# Patient Record
Sex: Female | Born: 1935 | Race: White | Hispanic: No | Marital: Married | Smoking: Never smoker
Health system: Southern US, Community
[De-identification: ages and names within clinical notes are randomized; demographics above are authoritative.]

## PROBLEM LIST (undated history)

## (undated) DIAGNOSIS — R55 Syncope and collapse: Secondary | ICD-10-CM

## (undated) DIAGNOSIS — R413 Other amnesia: Secondary | ICD-10-CM

## (undated) DIAGNOSIS — D649 Anemia, unspecified: Secondary | ICD-10-CM

## (undated) DIAGNOSIS — K5900 Constipation, unspecified: Secondary | ICD-10-CM

## (undated) DIAGNOSIS — M270 Developmental disorders of jaws: Secondary | ICD-10-CM

## (undated) DIAGNOSIS — R198 Other specified symptoms and signs involving the digestive system and abdomen: Secondary | ICD-10-CM

## (undated) DIAGNOSIS — K851 Biliary acute pancreatitis without necrosis or infection: Secondary | ICD-10-CM

## (undated) DIAGNOSIS — T4145XA Adverse effect of unspecified anesthetic, initial encounter: Secondary | ICD-10-CM

## (undated) DIAGNOSIS — K219 Gastro-esophageal reflux disease without esophagitis: Secondary | ICD-10-CM

## (undated) DIAGNOSIS — N390 Urinary tract infection, site not specified: Secondary | ICD-10-CM

## (undated) DIAGNOSIS — Z9889 Other specified postprocedural states: Secondary | ICD-10-CM

## (undated) DIAGNOSIS — R609 Edema, unspecified: Secondary | ICD-10-CM

## (undated) DIAGNOSIS — J189 Pneumonia, unspecified organism: Secondary | ICD-10-CM

## (undated) DIAGNOSIS — N289 Disorder of kidney and ureter, unspecified: Secondary | ICD-10-CM

## (undated) DIAGNOSIS — E785 Hyperlipidemia, unspecified: Secondary | ICD-10-CM

## (undated) DIAGNOSIS — E079 Disorder of thyroid, unspecified: Secondary | ICD-10-CM

## (undated) DIAGNOSIS — R531 Weakness: Secondary | ICD-10-CM

## (undated) DIAGNOSIS — T8859XA Other complications of anesthesia, initial encounter: Secondary | ICD-10-CM

## (undated) DIAGNOSIS — W19XXXA Unspecified fall, initial encounter: Secondary | ICD-10-CM

## (undated) DIAGNOSIS — K859 Acute pancreatitis without necrosis or infection, unspecified: Secondary | ICD-10-CM

## (undated) DIAGNOSIS — R21 Rash and other nonspecific skin eruption: Secondary | ICD-10-CM

## (undated) DIAGNOSIS — M1990 Unspecified osteoarthritis, unspecified site: Secondary | ICD-10-CM

## (undated) DIAGNOSIS — N644 Mastodynia: Secondary | ICD-10-CM

## (undated) DIAGNOSIS — F419 Anxiety disorder, unspecified: Secondary | ICD-10-CM

## (undated) DIAGNOSIS — R0600 Dyspnea, unspecified: Secondary | ICD-10-CM

## (undated) DIAGNOSIS — K589 Irritable bowel syndrome without diarrhea: Secondary | ICD-10-CM

## (undated) DIAGNOSIS — I639 Cerebral infarction, unspecified: Secondary | ICD-10-CM

## (undated) DIAGNOSIS — IMO0002 Reserved for concepts with insufficient information to code with codable children: Secondary | ICD-10-CM

## (undated) DIAGNOSIS — E538 Deficiency of other specified B group vitamins: Secondary | ICD-10-CM

## (undated) DIAGNOSIS — R112 Nausea with vomiting, unspecified: Secondary | ICD-10-CM

## (undated) DIAGNOSIS — D472 Monoclonal gammopathy: Secondary | ICD-10-CM

## (undated) DIAGNOSIS — R002 Palpitations: Secondary | ICD-10-CM

## (undated) DIAGNOSIS — N399 Disorder of urinary system, unspecified: Secondary | ICD-10-CM

## (undated) DIAGNOSIS — R42 Dizziness and giddiness: Secondary | ICD-10-CM

## (undated) DIAGNOSIS — R3915 Urgency of urination: Secondary | ICD-10-CM

## (undated) DIAGNOSIS — I872 Venous insufficiency (chronic) (peripheral): Secondary | ICD-10-CM

## (undated) DIAGNOSIS — I10 Essential (primary) hypertension: Secondary | ICD-10-CM

## (undated) DIAGNOSIS — E039 Hypothyroidism, unspecified: Secondary | ICD-10-CM

## (undated) HISTORY — DX: Anemia, unspecified: D64.9

## (undated) HISTORY — DX: Reserved for concepts with insufficient information to code with codable children: IMO0002

## (undated) HISTORY — DX: Hyperlipidemia, unspecified: E78.5

## (undated) HISTORY — DX: Anxiety disorder, unspecified: F41.9

## (undated) HISTORY — DX: Other amnesia: R41.3

## (undated) HISTORY — DX: Edema, unspecified: R60.9

## (undated) HISTORY — DX: Weakness: R53.1

## (undated) HISTORY — DX: Venous insufficiency (chronic) (peripheral): I87.2

## (undated) HISTORY — PX: ABDOMINAL HYSTERECTOMY: SHX81

## (undated) HISTORY — DX: Disorder of urinary system, unspecified: N39.9

## (undated) HISTORY — DX: Essential (primary) hypertension: I10

## (undated) HISTORY — DX: Urinary tract infection, site not specified: N39.0

## (undated) HISTORY — DX: Unspecified fall, initial encounter: W19.XXXA

## (undated) HISTORY — DX: Dizziness and giddiness: R42

## (undated) HISTORY — DX: Monoclonal gammopathy: D47.2

## (undated) HISTORY — DX: Syncope and collapse: R55

## (undated) HISTORY — DX: Dyspnea, unspecified: R06.00

## (undated) HISTORY — PX: OTHER SURGICAL HISTORY: SHX169

## (undated) HISTORY — DX: Unspecified osteoarthritis, unspecified site: M19.90

## (undated) HISTORY — DX: Palpitations: R00.2

## (undated) HISTORY — PX: BACK SURGERY: SHX140

## (undated) HISTORY — PX: APPENDECTOMY: SHX54

## (undated) HISTORY — DX: Disorder of thyroid, unspecified: E07.9

## (undated) HISTORY — DX: Mastodynia: N64.4

## (undated) HISTORY — DX: Developmental disorders of jaws: M27.0

## (undated) HISTORY — DX: Rash and other nonspecific skin eruption: R21

## (undated) HISTORY — DX: Gastro-esophageal reflux disease without esophagitis: K21.9

## (undated) HISTORY — PX: SHOULDER SURGERY: SHX246

## (undated) HISTORY — DX: Irritable bowel syndrome, unspecified: K58.9

## (undated) HISTORY — DX: Urgency of urination: R39.15

## (undated) HISTORY — DX: Hypothyroidism, unspecified: E03.9

## (undated) HISTORY — DX: Other specified symptoms and signs involving the digestive system and abdomen: R19.8

## (undated) HISTORY — DX: Deficiency of other specified B group vitamins: E53.8

---

## 1997-09-08 LAB — HM COLONOSCOPY

## 1998-09-07 ENCOUNTER — Encounter: Payer: Self-pay | Admitting: Internal Medicine

## 1998-09-07 LAB — CONVERTED CEMR LAB

## 1999-12-08 ENCOUNTER — Encounter: Payer: Self-pay | Admitting: Urology

## 1999-12-13 ENCOUNTER — Inpatient Hospital Stay (HOSPITAL_COMMUNITY): Admission: RE | Admit: 1999-12-13 | Discharge: 1999-12-15 | Payer: Self-pay | Admitting: Urology

## 2001-06-28 ENCOUNTER — Encounter (INDEPENDENT_AMBULATORY_CARE_PROVIDER_SITE_OTHER): Payer: Self-pay | Admitting: Specialist

## 2001-06-28 ENCOUNTER — Other Ambulatory Visit: Admission: RE | Admit: 2001-06-28 | Discharge: 2001-06-28 | Payer: Self-pay | Admitting: Oncology

## 2001-07-30 ENCOUNTER — Ambulatory Visit (HOSPITAL_BASED_OUTPATIENT_CLINIC_OR_DEPARTMENT_OTHER): Admission: RE | Admit: 2001-07-30 | Discharge: 2001-07-31 | Payer: Self-pay | Admitting: Orthopedic Surgery

## 2002-09-18 ENCOUNTER — Encounter: Payer: Self-pay | Admitting: Family Medicine

## 2002-09-18 ENCOUNTER — Encounter: Admission: RE | Admit: 2002-09-18 | Discharge: 2002-09-18 | Payer: Self-pay | Admitting: Family Medicine

## 2002-10-14 ENCOUNTER — Encounter: Admission: RE | Admit: 2002-10-14 | Discharge: 2002-10-14 | Payer: Self-pay | Admitting: Family Medicine

## 2002-10-14 ENCOUNTER — Encounter: Payer: Self-pay | Admitting: Family Medicine

## 2003-01-07 ENCOUNTER — Encounter: Payer: Self-pay | Admitting: Family Medicine

## 2003-01-07 ENCOUNTER — Encounter: Admission: RE | Admit: 2003-01-07 | Discharge: 2003-01-07 | Payer: Self-pay | Admitting: Family Medicine

## 2003-04-24 ENCOUNTER — Encounter: Payer: Self-pay | Admitting: Radiology

## 2003-04-24 ENCOUNTER — Encounter: Payer: Self-pay | Admitting: Family Medicine

## 2003-04-24 ENCOUNTER — Encounter: Admission: RE | Admit: 2003-04-24 | Discharge: 2003-04-24 | Payer: Self-pay | Admitting: Family Medicine

## 2003-05-08 ENCOUNTER — Encounter: Admission: RE | Admit: 2003-05-08 | Discharge: 2003-05-08 | Payer: Self-pay | Admitting: Family Medicine

## 2003-05-08 ENCOUNTER — Encounter: Payer: Self-pay | Admitting: Family Medicine

## 2003-09-23 ENCOUNTER — Inpatient Hospital Stay (HOSPITAL_COMMUNITY): Admission: RE | Admit: 2003-09-23 | Discharge: 2003-09-27 | Payer: Self-pay | Admitting: Neurosurgery

## 2004-07-15 ENCOUNTER — Ambulatory Visit: Payer: Self-pay | Admitting: Internal Medicine

## 2004-07-22 ENCOUNTER — Ambulatory Visit: Payer: Self-pay | Admitting: Oncology

## 2004-10-05 ENCOUNTER — Ambulatory Visit: Payer: Self-pay | Admitting: Internal Medicine

## 2004-10-11 ENCOUNTER — Ambulatory Visit: Payer: Self-pay | Admitting: Internal Medicine

## 2004-11-17 ENCOUNTER — Ambulatory Visit: Payer: Self-pay | Admitting: Oncology

## 2004-12-31 ENCOUNTER — Ambulatory Visit: Payer: Self-pay | Admitting: Internal Medicine

## 2005-01-06 ENCOUNTER — Ambulatory Visit: Payer: Self-pay | Admitting: Internal Medicine

## 2005-03-09 ENCOUNTER — Ambulatory Visit: Payer: Self-pay | Admitting: Oncology

## 2005-03-30 ENCOUNTER — Ambulatory Visit: Payer: Self-pay | Admitting: Internal Medicine

## 2005-04-06 ENCOUNTER — Ambulatory Visit: Payer: Self-pay | Admitting: Internal Medicine

## 2005-05-18 ENCOUNTER — Ambulatory Visit: Payer: Self-pay | Admitting: Internal Medicine

## 2005-08-24 ENCOUNTER — Ambulatory Visit: Payer: Self-pay | Admitting: Oncology

## 2006-02-08 ENCOUNTER — Ambulatory Visit: Payer: Self-pay | Admitting: Oncology

## 2006-03-22 ENCOUNTER — Ambulatory Visit: Payer: Self-pay | Admitting: Internal Medicine

## 2006-03-29 ENCOUNTER — Ambulatory Visit (HOSPITAL_COMMUNITY): Admission: RE | Admit: 2006-03-29 | Discharge: 2006-03-29 | Payer: Self-pay | Admitting: Internal Medicine

## 2006-05-16 ENCOUNTER — Ambulatory Visit: Payer: Self-pay | Admitting: Internal Medicine

## 2006-05-23 ENCOUNTER — Ambulatory Visit: Payer: Self-pay | Admitting: Internal Medicine

## 2006-07-17 ENCOUNTER — Ambulatory Visit: Payer: Self-pay | Admitting: Internal Medicine

## 2006-08-07 ENCOUNTER — Ambulatory Visit: Payer: Self-pay | Admitting: Oncology

## 2006-11-20 ENCOUNTER — Ambulatory Visit: Payer: Self-pay | Admitting: Internal Medicine

## 2006-11-20 LAB — CONVERTED CEMR LAB
ALT: 17 units/L (ref 0–40)
AST: 19 units/L (ref 0–37)
BUN: 14 mg/dL (ref 6–23)
Creatinine, Ser: 1.2 mg/dL (ref 0.4–1.2)
Total CK: 93 units/L (ref 7–177)
Triglycerides: 166 mg/dL — ABNORMAL HIGH (ref 0–149)

## 2006-11-21 ENCOUNTER — Ambulatory Visit: Payer: Self-pay | Admitting: Internal Medicine

## 2007-01-02 ENCOUNTER — Ambulatory Visit: Payer: Self-pay | Admitting: Internal Medicine

## 2007-01-02 LAB — CONVERTED CEMR LAB
Basophils Relative: 1 % (ref 0.0–1.0)
CO2: 28 meq/L (ref 19–32)
Calcium: 9.1 mg/dL (ref 8.4–10.5)
Creatinine, Ser: 1.1 mg/dL (ref 0.4–1.2)
Eosinophils Relative: 1.1 % (ref 0.0–5.0)
GFR calc Af Amer: 63 mL/min
Glucose, Bld: 96 mg/dL (ref 70–99)
Hemoglobin: 11.7 g/dL — ABNORMAL LOW (ref 12.0–15.0)
Leukocytes, UA: NEGATIVE
Lymphocytes Relative: 11.8 % — ABNORMAL LOW (ref 12.0–46.0)
Monocytes Absolute: 0.4 10*3/uL (ref 0.2–0.7)
Neutro Abs: 5 10*3/uL (ref 1.4–7.7)
Nitrite: NEGATIVE
Platelets: 183 10*3/uL (ref 150–400)
RDW: 12.1 % (ref 11.5–14.6)
Specific Gravity, Urine: 1.01 (ref 1.000–1.03)
Total Protein, Urine: NEGATIVE mg/dL
Urobilinogen, UA: 0.2 (ref 0.0–1.0)
WBC: 6.3 10*3/uL (ref 4.5–10.5)
pH: 7 (ref 5.0–8.0)

## 2007-03-14 ENCOUNTER — Ambulatory Visit: Payer: Self-pay | Admitting: Internal Medicine

## 2007-03-14 LAB — CONVERTED CEMR LAB
Hemoglobin, Urine: NEGATIVE
Ketones, ur: NEGATIVE mg/dL
Nitrite: NEGATIVE
Urobilinogen, UA: 0.2 (ref 0.0–1.0)

## 2007-03-19 ENCOUNTER — Ambulatory Visit: Payer: Self-pay | Admitting: Internal Medicine

## 2007-04-02 ENCOUNTER — Ambulatory Visit: Payer: Self-pay | Admitting: Internal Medicine

## 2007-04-10 ENCOUNTER — Encounter: Payer: Self-pay | Admitting: Internal Medicine

## 2007-04-10 DIAGNOSIS — E039 Hypothyroidism, unspecified: Secondary | ICD-10-CM | POA: Insufficient documentation

## 2007-04-10 DIAGNOSIS — E538 Deficiency of other specified B group vitamins: Secondary | ICD-10-CM | POA: Insufficient documentation

## 2007-04-10 DIAGNOSIS — D472 Monoclonal gammopathy: Secondary | ICD-10-CM | POA: Insufficient documentation

## 2007-04-10 DIAGNOSIS — K589 Irritable bowel syndrome without diarrhea: Secondary | ICD-10-CM | POA: Insufficient documentation

## 2007-04-10 DIAGNOSIS — D649 Anemia, unspecified: Secondary | ICD-10-CM | POA: Insufficient documentation

## 2007-04-10 DIAGNOSIS — I10 Essential (primary) hypertension: Secondary | ICD-10-CM | POA: Insufficient documentation

## 2007-07-04 ENCOUNTER — Encounter: Payer: Self-pay | Admitting: Internal Medicine

## 2007-07-04 ENCOUNTER — Ambulatory Visit: Payer: Self-pay | Admitting: Internal Medicine

## 2007-07-08 DIAGNOSIS — E785 Hyperlipidemia, unspecified: Secondary | ICD-10-CM | POA: Insufficient documentation

## 2007-07-08 DIAGNOSIS — M1990 Unspecified osteoarthritis, unspecified site: Secondary | ICD-10-CM | POA: Insufficient documentation

## 2007-07-13 ENCOUNTER — Telehealth: Payer: Self-pay | Admitting: Internal Medicine

## 2007-07-27 ENCOUNTER — Encounter: Payer: Self-pay | Admitting: Internal Medicine

## 2007-08-09 ENCOUNTER — Encounter: Payer: Self-pay | Admitting: Internal Medicine

## 2007-10-30 ENCOUNTER — Encounter: Payer: Self-pay | Admitting: Internal Medicine

## 2007-11-01 ENCOUNTER — Encounter: Payer: Self-pay | Admitting: Internal Medicine

## 2007-12-21 ENCOUNTER — Ambulatory Visit: Payer: Self-pay | Admitting: Internal Medicine

## 2007-12-21 DIAGNOSIS — R609 Edema, unspecified: Secondary | ICD-10-CM | POA: Insufficient documentation

## 2008-01-01 ENCOUNTER — Ambulatory Visit: Payer: Self-pay | Admitting: Internal Medicine

## 2008-01-01 DIAGNOSIS — R21 Rash and other nonspecific skin eruption: Secondary | ICD-10-CM | POA: Insufficient documentation

## 2008-01-01 DIAGNOSIS — I872 Venous insufficiency (chronic) (peripheral): Secondary | ICD-10-CM | POA: Insufficient documentation

## 2008-01-02 LAB — CONVERTED CEMR LAB
BUN: 24 mg/dL — ABNORMAL HIGH (ref 6–23)
Basophils Relative: 0.2 % (ref 0.0–1.0)
Bilirubin Urine: NEGATIVE
CO2: 29 meq/L (ref 19–32)
Calcium: 9.3 mg/dL (ref 8.4–10.5)
Eosinophils Relative: 5.8 % — ABNORMAL HIGH (ref 0.0–5.0)
GFR calc Af Amer: 57 mL/min
Glucose, Bld: 86 mg/dL (ref 70–99)
HCT: 32.4 % — ABNORMAL LOW (ref 36.0–46.0)
Hemoglobin: 11.4 g/dL — ABNORMAL LOW (ref 12.0–15.0)
Leukocytes, UA: NEGATIVE
Lymphocytes Relative: 25.5 % (ref 12.0–46.0)
Monocytes Absolute: 0.5 10*3/uL (ref 0.1–1.0)
Monocytes Relative: 9.2 % (ref 3.0–12.0)
Neutro Abs: 3.6 10*3/uL (ref 1.4–7.7)
Nitrite: NEGATIVE
RBC: 3.53 M/uL — ABNORMAL LOW (ref 3.87–5.11)
RDW: 11.9 % (ref 11.5–14.6)
Sodium: 141 meq/L (ref 135–145)
Specific Gravity, Urine: <=1.005 (ref 1.000–1.03)
TSH: 0.64 microintl units/mL (ref 0.35–5.50)
Total Protein, Urine: NEGATIVE mg/dL
Vitamin B-12: 928 pg/mL — ABNORMAL HIGH (ref 211–911)
WBC: 5.9 10*3/uL (ref 4.5–10.5)
pH: 5.5 (ref 5.0–8.0)

## 2008-01-16 ENCOUNTER — Ambulatory Visit: Payer: Self-pay | Admitting: Internal Medicine

## 2008-01-16 LAB — CONVERTED CEMR LAB: CRP, High Sensitivity: 6 — ABNORMAL HIGH (ref 0.00–5.00)

## 2008-01-22 ENCOUNTER — Ambulatory Visit: Payer: Self-pay

## 2008-03-21 ENCOUNTER — Ambulatory Visit: Payer: Self-pay | Admitting: Internal Medicine

## 2008-04-30 ENCOUNTER — Encounter: Payer: Self-pay | Admitting: Internal Medicine

## 2008-07-29 ENCOUNTER — Telehealth: Payer: Self-pay | Admitting: Internal Medicine

## 2008-08-07 ENCOUNTER — Encounter: Payer: Self-pay | Admitting: Internal Medicine

## 2008-08-25 ENCOUNTER — Telehealth: Payer: Self-pay | Admitting: Internal Medicine

## 2008-08-27 ENCOUNTER — Encounter: Payer: Self-pay | Admitting: Internal Medicine

## 2008-09-26 ENCOUNTER — Ambulatory Visit: Payer: Self-pay | Admitting: Internal Medicine

## 2008-09-26 DIAGNOSIS — R198 Other specified symptoms and signs involving the digestive system and abdomen: Secondary | ICD-10-CM | POA: Insufficient documentation

## 2008-09-26 LAB — CONVERTED CEMR LAB: Retic Ct Pct: 1.2 % (ref 0.4–3.1)

## 2008-09-26 LAB — HM MAMMOGRAPHY

## 2008-09-30 ENCOUNTER — Encounter: Payer: Self-pay | Admitting: Internal Medicine

## 2008-09-30 LAB — CONVERTED CEMR LAB
ALT: 15 units/L (ref 0–35)
AST: 21 units/L (ref 0–37)
Alkaline Phosphatase: 75 units/L (ref 39–117)
Basophils Absolute: 0 10*3/uL (ref 0.0–0.1)
Bilirubin Urine: NEGATIVE
Bilirubin, Direct: 0.1 mg/dL (ref 0.0–0.3)
CO2: 27 meq/L (ref 19–32)
Calcium: 9.1 mg/dL (ref 8.4–10.5)
Chloride: 102 meq/L (ref 96–112)
Iron: 61 ug/dL (ref 42–145)
Ketones, ur: NEGATIVE mg/dL
Lymphocytes Relative: 23.9 % (ref 12.0–46.0)
MCHC: 34.2 g/dL (ref 30.0–36.0)
Monocytes Relative: 9.3 % (ref 3.0–12.0)
Mucus, UA: NEGATIVE
Neutrophils Relative %: 59.1 % (ref 43.0–77.0)
Nitrite: POSITIVE — AB
Potassium: 4.2 meq/L (ref 3.5–5.1)
RBC / HPF: NONE SEEN
RDW: 13 % (ref 11.5–14.6)
Sodium: 138 meq/L (ref 135–145)
Specific Gravity, Urine: 1.025 (ref 1.000–1.03)
TSH: 0.28 microintl units/mL — ABNORMAL LOW (ref 0.35–5.50)
Total Bilirubin: 0.6 mg/dL (ref 0.3–1.2)
Urobilinogen, UA: 0.2 (ref 0.0–1.0)
pH: 5 (ref 5.0–8.0)

## 2008-10-01 ENCOUNTER — Telehealth: Payer: Self-pay | Admitting: Internal Medicine

## 2008-10-03 ENCOUNTER — Ambulatory Visit: Payer: Self-pay | Admitting: Internal Medicine

## 2008-10-03 LAB — CONVERTED CEMR LAB: Fecal Occult Bld: NEGATIVE

## 2008-10-14 ENCOUNTER — Encounter: Admission: RE | Admit: 2008-10-14 | Discharge: 2008-10-14 | Payer: Self-pay | Admitting: Internal Medicine

## 2008-10-23 ENCOUNTER — Ambulatory Visit: Payer: Self-pay | Admitting: Internal Medicine

## 2008-10-27 ENCOUNTER — Ambulatory Visit: Payer: Self-pay | Admitting: Internal Medicine

## 2008-10-27 DIAGNOSIS — IMO0002 Reserved for concepts with insufficient information to code with codable children: Secondary | ICD-10-CM | POA: Insufficient documentation

## 2008-10-27 DIAGNOSIS — R3915 Urgency of urination: Secondary | ICD-10-CM | POA: Insufficient documentation

## 2008-10-27 LAB — CONVERTED CEMR LAB: TSH: 0.72 u[IU]/mL (ref 0.35–5.50)

## 2008-10-28 ENCOUNTER — Encounter: Payer: Self-pay | Admitting: Internal Medicine

## 2008-10-29 LAB — CONVERTED CEMR LAB
Crystals: NEGATIVE
Hemoglobin, Urine: NEGATIVE
Urine Glucose: NEGATIVE mg/dL
Urobilinogen, UA: 0.2 (ref 0.0–1.0)

## 2008-11-03 ENCOUNTER — Telehealth: Payer: Self-pay | Admitting: Internal Medicine

## 2008-11-17 ENCOUNTER — Ambulatory Visit: Payer: Self-pay | Admitting: Internal Medicine

## 2008-11-17 ENCOUNTER — Telehealth: Payer: Self-pay | Admitting: Internal Medicine

## 2008-11-17 DIAGNOSIS — R55 Syncope and collapse: Secondary | ICD-10-CM | POA: Insufficient documentation

## 2008-11-19 ENCOUNTER — Telehealth: Payer: Self-pay | Admitting: Internal Medicine

## 2008-11-19 DIAGNOSIS — N644 Mastodynia: Secondary | ICD-10-CM | POA: Insufficient documentation

## 2008-11-25 ENCOUNTER — Encounter: Payer: Self-pay | Admitting: Internal Medicine

## 2008-11-26 ENCOUNTER — Ambulatory Visit: Payer: Self-pay | Admitting: Internal Medicine

## 2008-11-27 ENCOUNTER — Encounter: Payer: Self-pay | Admitting: Internal Medicine

## 2008-11-27 ENCOUNTER — Ambulatory Visit: Payer: Self-pay

## 2009-05-06 ENCOUNTER — Encounter: Payer: Self-pay | Admitting: Internal Medicine

## 2009-05-13 ENCOUNTER — Encounter: Payer: Self-pay | Admitting: Internal Medicine

## 2009-07-07 ENCOUNTER — Ambulatory Visit: Payer: Self-pay | Admitting: Internal Medicine

## 2009-07-07 DIAGNOSIS — R002 Palpitations: Secondary | ICD-10-CM | POA: Insufficient documentation

## 2009-07-14 ENCOUNTER — Ambulatory Visit: Payer: Self-pay | Admitting: Cardiovascular Disease

## 2009-07-20 ENCOUNTER — Encounter: Payer: Self-pay | Admitting: Cardiology

## 2009-07-20 ENCOUNTER — Ambulatory Visit: Payer: Self-pay | Admitting: Cardiovascular Disease

## 2009-08-03 ENCOUNTER — Ambulatory Visit: Payer: Self-pay | Admitting: Cardiovascular Disease

## 2009-08-06 ENCOUNTER — Telehealth: Payer: Self-pay | Admitting: Internal Medicine

## 2009-09-17 ENCOUNTER — Ambulatory Visit: Payer: Self-pay | Admitting: Internal Medicine

## 2009-09-17 DIAGNOSIS — M899 Disorder of bone, unspecified: Secondary | ICD-10-CM | POA: Insufficient documentation

## 2009-09-17 DIAGNOSIS — R0602 Shortness of breath: Secondary | ICD-10-CM | POA: Insufficient documentation

## 2009-09-17 DIAGNOSIS — K219 Gastro-esophageal reflux disease without esophagitis: Secondary | ICD-10-CM | POA: Insufficient documentation

## 2009-09-17 DIAGNOSIS — M949 Disorder of cartilage, unspecified: Secondary | ICD-10-CM

## 2009-10-15 ENCOUNTER — Ambulatory Visit: Payer: Self-pay | Admitting: Internal Medicine

## 2009-10-20 ENCOUNTER — Ambulatory Visit: Payer: Self-pay | Admitting: Cardiovascular Disease

## 2009-10-21 ENCOUNTER — Telehealth: Payer: Self-pay | Admitting: Internal Medicine

## 2009-10-30 ENCOUNTER — Ambulatory Visit: Payer: Self-pay | Admitting: Cardiovascular Disease

## 2009-11-02 ENCOUNTER — Telehealth: Payer: Self-pay | Admitting: Cardiology

## 2009-11-02 ENCOUNTER — Telehealth: Payer: Self-pay | Admitting: Internal Medicine

## 2009-11-09 ENCOUNTER — Telehealth: Payer: Self-pay | Admitting: Internal Medicine

## 2009-11-17 ENCOUNTER — Encounter: Payer: Self-pay | Admitting: Cardiovascular Disease

## 2009-11-18 ENCOUNTER — Encounter: Payer: Self-pay | Admitting: Cardiovascular Disease

## 2009-11-18 ENCOUNTER — Ambulatory Visit: Payer: Self-pay

## 2009-11-19 ENCOUNTER — Ambulatory Visit: Payer: Self-pay | Admitting: Cardiovascular Disease

## 2009-12-08 ENCOUNTER — Encounter: Payer: Self-pay | Admitting: Internal Medicine

## 2010-01-02 ENCOUNTER — Telehealth: Payer: Self-pay | Admitting: Family Medicine

## 2010-01-02 ENCOUNTER — Encounter: Payer: Self-pay | Admitting: Internal Medicine

## 2010-01-04 ENCOUNTER — Telehealth: Payer: Self-pay | Admitting: Internal Medicine

## 2010-01-09 ENCOUNTER — Ambulatory Visit: Payer: Self-pay | Admitting: Family Medicine

## 2010-01-11 ENCOUNTER — Telehealth: Payer: Self-pay | Admitting: Internal Medicine

## 2010-01-14 ENCOUNTER — Ambulatory Visit: Payer: Self-pay | Admitting: Internal Medicine

## 2010-01-21 ENCOUNTER — Encounter: Payer: Self-pay | Admitting: Internal Medicine

## 2010-02-12 ENCOUNTER — Ambulatory Visit: Payer: Self-pay | Admitting: Internal Medicine

## 2010-02-12 DIAGNOSIS — F341 Dysthymic disorder: Secondary | ICD-10-CM | POA: Insufficient documentation

## 2010-02-12 DIAGNOSIS — F411 Generalized anxiety disorder: Secondary | ICD-10-CM | POA: Insufficient documentation

## 2010-02-22 ENCOUNTER — Encounter: Payer: Self-pay | Admitting: Internal Medicine

## 2010-03-01 ENCOUNTER — Encounter: Admission: RE | Admit: 2010-03-01 | Discharge: 2010-03-01 | Payer: Self-pay | Admitting: Gastroenterology

## 2010-03-04 ENCOUNTER — Telehealth: Payer: Self-pay | Admitting: Internal Medicine

## 2010-04-21 ENCOUNTER — Encounter: Payer: Self-pay | Admitting: Internal Medicine

## 2010-05-12 ENCOUNTER — Encounter: Payer: Self-pay | Admitting: Internal Medicine

## 2010-06-08 ENCOUNTER — Telehealth: Payer: Self-pay | Admitting: Internal Medicine

## 2010-08-02 ENCOUNTER — Ambulatory Visit: Payer: Self-pay | Admitting: Internal Medicine

## 2010-08-02 DIAGNOSIS — D3705 Neoplasm of uncertain behavior of pharynx: Secondary | ICD-10-CM

## 2010-08-02 DIAGNOSIS — D3701 Neoplasm of uncertain behavior of lip: Secondary | ICD-10-CM | POA: Insufficient documentation

## 2010-08-02 DIAGNOSIS — D3709 Neoplasm of uncertain behavior of other specified sites of the oral cavity: Secondary | ICD-10-CM

## 2010-08-23 IMAGING — CT CT ABDOMEN WO/W CM
2 of 8 series · 12 of 36 positions shown, 18 images · IV contrast (WATER)
Comparison: Ultrasound of 10/14/2008.  Abdominal/pelvic CT of
04/02/2007

CLINICAL DATA: Weight loss.  Anorexia.

CT ABDOMEN WITHOUT AND WITH CONTRAST
TECHNIQUE: Multidetector CT imaging of the abdomen was performed
following the standard protocol before and during bolus
administration of intravenous contrast.
Contrast: 100  ml Mmnipaque-TWW

[Series 4: arterial/port venous (id) · axial · arterial · 0.70mm/px · z∈[-231,-51]mm · 9 of 183 slices shown, 15 images]
[im 19/183  soft-tissue]
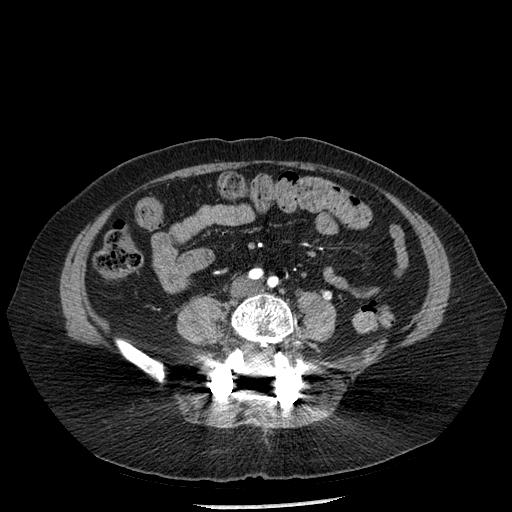
[im 19/183  bone]
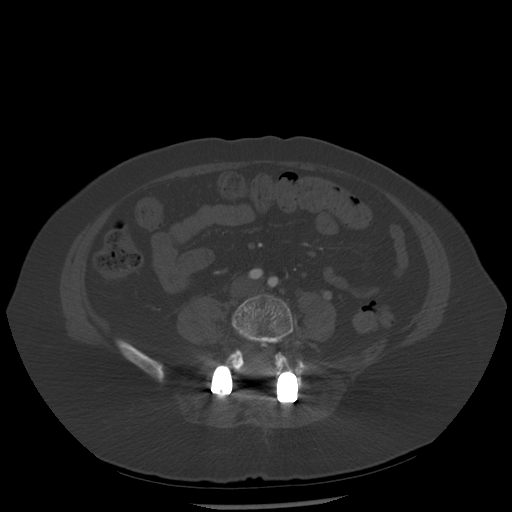
[im 37/183  soft-tissue]
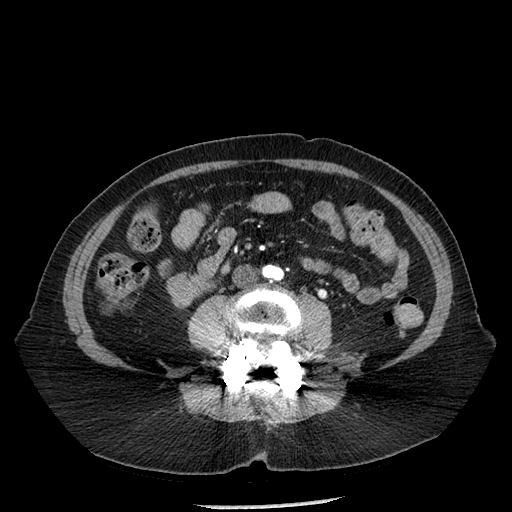
[im 55/183  soft-tissue]
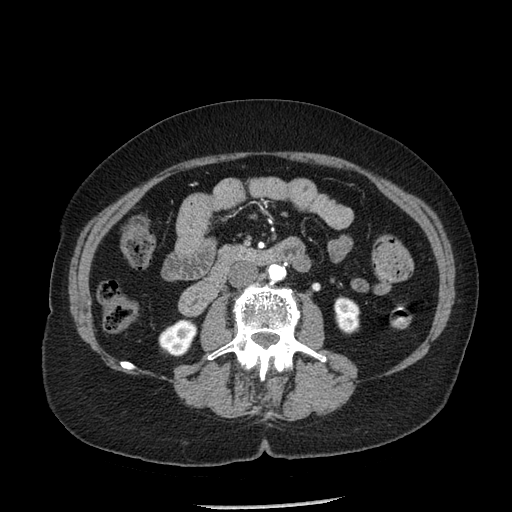
[im 73/183  soft-tissue]
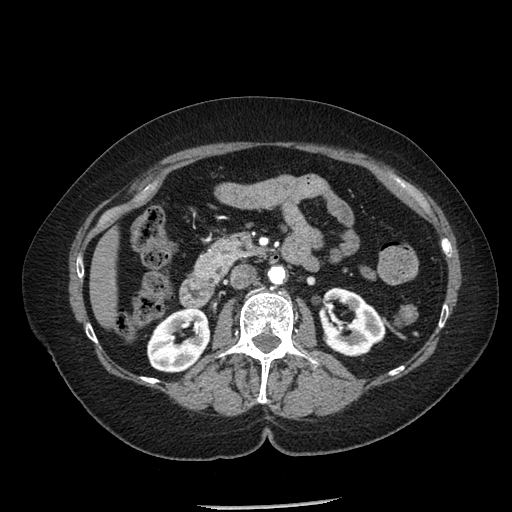
[im 92/183  soft-tissue]
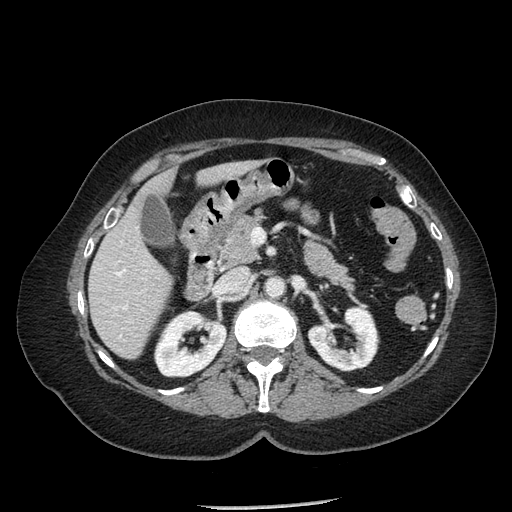
[im 110/183  soft-tissue]
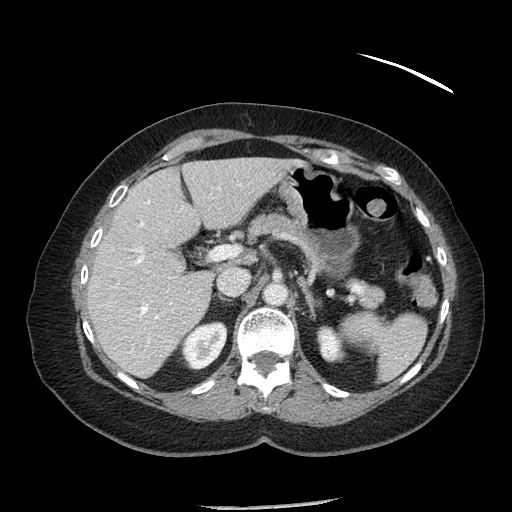
[im 110/183  lung]
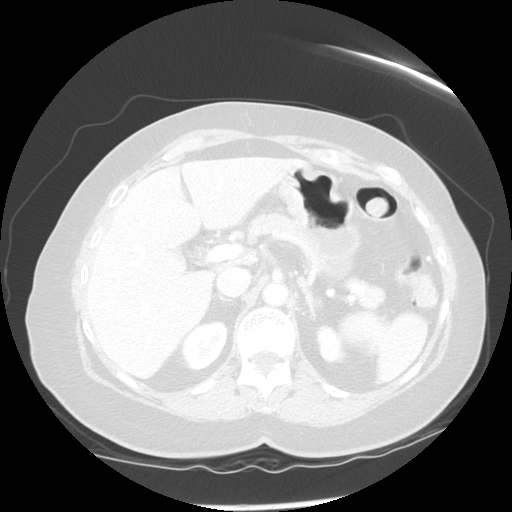
[im 128/183  soft-tissue]
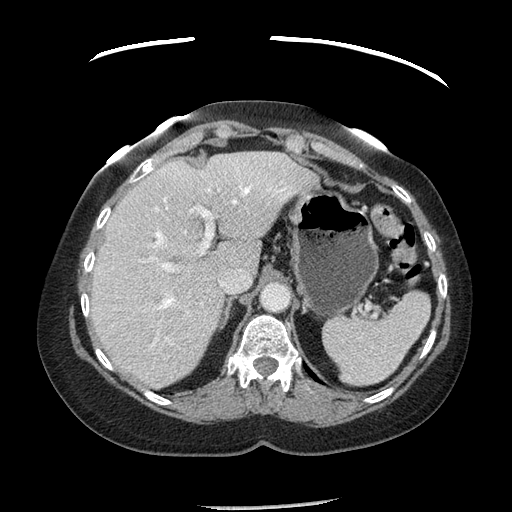
[im 128/183  lung]
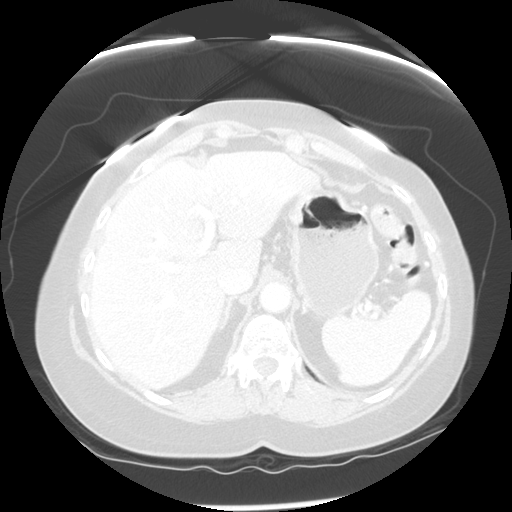
[im 146/183  soft-tissue]
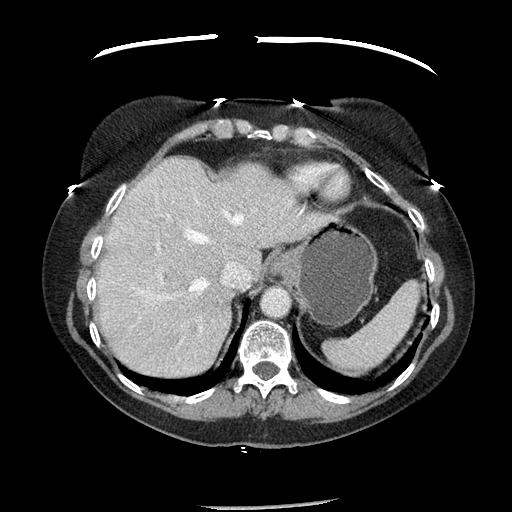
[im 146/183  lung]
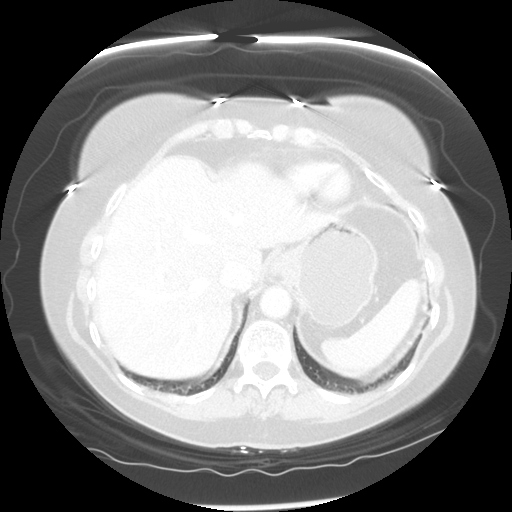
[im 164/183  soft-tissue]
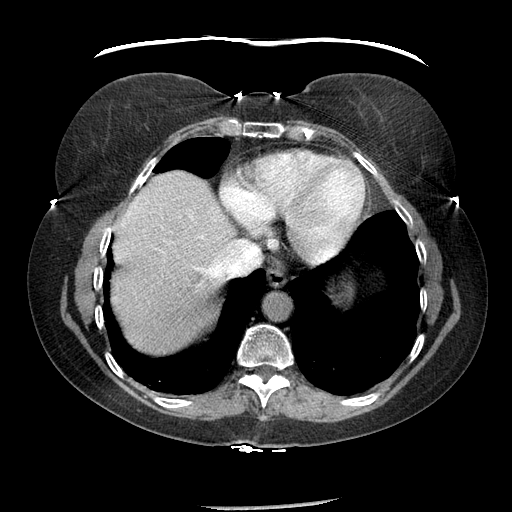
[im 164/183  lung]
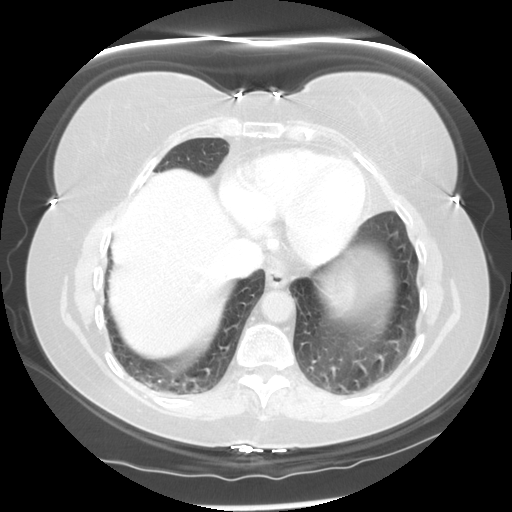
[im 164/183  bone]
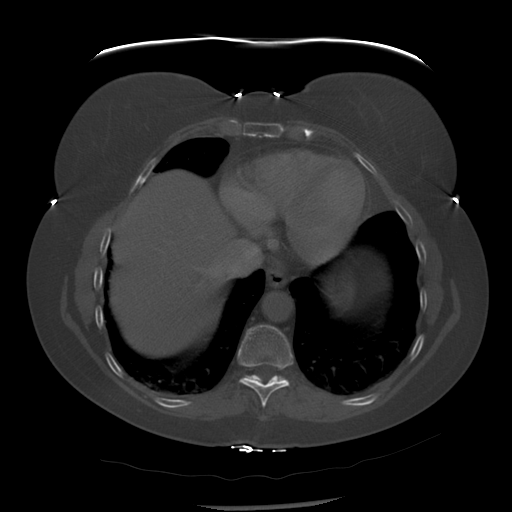

[Series 602: sagittal body · sagittal · 0.70mm/px · 3 of 141 slices shown]
[im 21/141  soft-tissue]
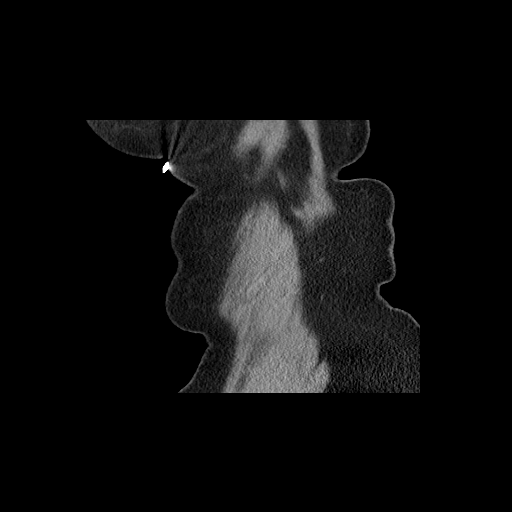
[im 41/141  soft-tissue]
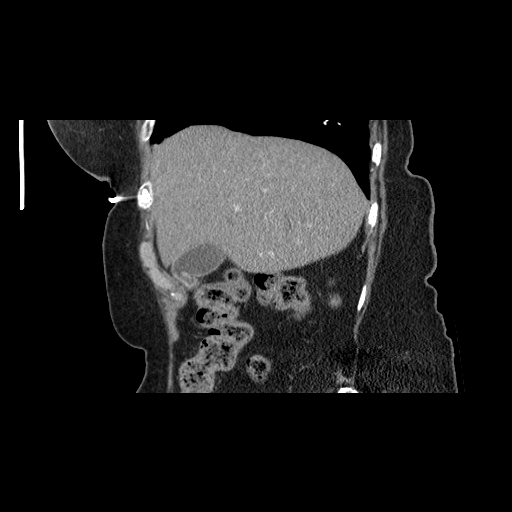
[im 61/141  soft-tissue]
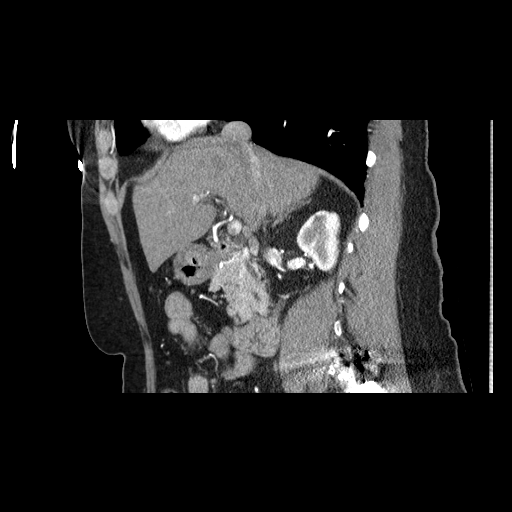

[12 of 36 positions shown; findings below may reference images not displayed]

FINDINGS: Unenhanced images demonstrate no pancreatic
calcifications to suggest chronic calcific pancreatitis.  A
calcified gallstone measures 1.2 cm.

Arterial/pancreatic phase images demonstrate no hypervascular liver
lesions.  Normal homogeneous enhancement of the pancreas.  Widely
patent celiac axis and superior mesenteric artery.

The dorsal pancreatic duct is mildly prominent in the pancreatic
head without definite communication to the pancreatic ventral duct.
Example images 38 - 45 of series 4.  Similar to 04/02/2007.

Portal venous phase images demonstrate clear lung bases.  Normal
heart size without pericardial or pleural effusion.  A tiny hiatal
hernia.  No focal liver lesions.  Normal spleen, stomach.  No
further pancreatic findings.  Regional vasculature all patent.
Mild enhancement in the gallbladder fundus on image 58 of series 4
without surrounding edema.  Normal biliary tract, adrenal glands.
Normal kidneys.  Mild atherosclerosis at the origin of bilateral
renal arteries. No retroperitoneal or retrocrural adenopathy.

Descending colonic diverticulosis.  Normal terminal ileum.  Normal
abdominal small bowel without ascites. Prior lumbar spine fixation.
IMPRESSION: 1.  No evidence of pancreatic mass or other acute abdominal
process.
2.  Mild stable prominence of the dorsal pancreatic duct within the
pancreatic head without definite communication into the ventral
duct.  Findings may represent pancreas divisum or a variant.  No
evidence of acute or chronic pancreatitis.
3.  Cholelithiasis without cholecystitis.  Mild enhancement within
the gallbladder fundus is suspicious for an area of
adenomyomatosis.

## 2010-09-22 ENCOUNTER — Ambulatory Visit
Admission: RE | Admit: 2010-09-22 | Discharge: 2010-09-22 | Payer: Self-pay | Source: Home / Self Care | Attending: Internal Medicine | Admitting: Internal Medicine

## 2010-09-22 ENCOUNTER — Other Ambulatory Visit: Payer: Self-pay | Admitting: Internal Medicine

## 2010-09-22 DIAGNOSIS — R030 Elevated blood-pressure reading, without diagnosis of hypertension: Secondary | ICD-10-CM | POA: Insufficient documentation

## 2010-09-22 LAB — URINALYSIS
Bilirubin Urine: NEGATIVE
Hemoglobin, Urine: NEGATIVE
Ketones, ur: NEGATIVE
Leukocytes, UA: NEGATIVE
Nitrite: NEGATIVE
Specific Gravity, Urine: 1.015 (ref 1.000–1.030)
Total Protein, Urine: NEGATIVE
Urine Glucose: NEGATIVE
Urobilinogen, UA: 0.2 (ref 0.0–1.0)
pH: 6 (ref 5.0–8.0)

## 2010-09-22 LAB — CBC WITH DIFFERENTIAL/PLATELET
Basophils Absolute: 0 10*3/uL (ref 0.0–0.1)
Basophils Relative: 0.4 % (ref 0.0–3.0)
Eosinophils Absolute: 0.3 10*3/uL (ref 0.0–0.7)
Eosinophils Relative: 5 % (ref 0.0–5.0)
HCT: 33.2 % — ABNORMAL LOW (ref 36.0–46.0)
Hemoglobin: 11.8 g/dL — ABNORMAL LOW (ref 12.0–15.0)
Lymphocytes Relative: 24.5 % (ref 12.0–46.0)
Lymphs Abs: 1.4 10*3/uL (ref 0.7–4.0)
MCHC: 35.5 g/dL (ref 30.0–36.0)
MCV: 92.3 fl (ref 78.0–100.0)
Monocytes Absolute: 0.5 10*3/uL (ref 0.1–1.0)
Monocytes Relative: 8.8 % (ref 3.0–12.0)
Neutro Abs: 3.5 10*3/uL (ref 1.4–7.7)
Neutrophils Relative %: 61.3 % (ref 43.0–77.0)
Platelets: 169 10*3/uL (ref 150.0–400.0)
RBC: 3.59 Mil/uL — ABNORMAL LOW (ref 3.87–5.11)
RDW: 13.1 % (ref 11.5–14.6)
WBC: 5.7 10*3/uL (ref 4.5–10.5)

## 2010-09-22 LAB — BASIC METABOLIC PANEL
BUN: 25 mg/dL — ABNORMAL HIGH (ref 6–23)
CO2: 29 mEq/L (ref 19–32)
Calcium: 9.7 mg/dL (ref 8.4–10.5)
Chloride: 104 mEq/L (ref 96–112)
Creatinine, Ser: 1.4 mg/dL — ABNORMAL HIGH (ref 0.4–1.2)
GFR: 39.84 mL/min — ABNORMAL LOW (ref >60.00)
Glucose, Bld: 87 mg/dL (ref 70–99)
Potassium: 4.5 mEq/L (ref 3.5–5.1)
Sodium: 141 mEq/L (ref 135–145)

## 2010-09-22 LAB — HEPATIC FUNCTION PANEL
ALT: 16 U/L (ref 0–35)
AST: 20 U/L (ref 0–37)
Albumin: 4 g/dL (ref 3.5–5.2)
Alkaline Phosphatase: 57 U/L (ref 39–117)
Bilirubin, Direct: 0.1 mg/dL (ref 0.0–0.3)
Total Bilirubin: 0.6 mg/dL (ref 0.3–1.2)
Total Protein: 8.4 g/dL — ABNORMAL HIGH (ref 6.0–8.3)

## 2010-09-22 LAB — TSH: TSH: 1.11 u[IU]/mL (ref 0.35–5.50)

## 2010-09-22 LAB — SEDIMENTATION RATE: Sed Rate: 41 mm/hr — ABNORMAL HIGH (ref 0–22)

## 2010-09-22 LAB — VITAMIN B12: Vitamin B-12: 548 pg/mL (ref 211–911)

## 2010-09-26 ENCOUNTER — Encounter: Payer: Self-pay | Admitting: Neurosurgery

## 2010-10-03 LAB — CONVERTED CEMR LAB
BUN: 21 mg/dL (ref 6–23)
BUN: 23 mg/dL (ref 6–23)
Bacteria, UA: NEGATIVE
Bilirubin Urine: NEGATIVE
Calcium: 9 mg/dL (ref 8.4–10.5)
Calcium: 9.4 mg/dL (ref 8.4–10.5)
Crystals: NEGATIVE
Eosinophils Absolute: 0.2 10*3/uL (ref 0.0–0.7)
Eosinophils Relative: 6.6 % — ABNORMAL HIGH (ref 0.0–5.0)
GFR calc Af Amer: 62 mL/min
GFR calc non Af Amer: 42.47 mL/min (ref >60)
GFR calc non Af Amer: 52 mL/min
HCT: 31.7 % — ABNORMAL LOW (ref 36.0–46.0)
HCT: 33.4 % — ABNORMAL LOW (ref 36.0–46.0)
Hemoglobin, Urine: NEGATIVE
Hemoglobin, Urine: NEGATIVE
Lymphocytes Relative: 26.8 % (ref 12.0–46.0)
Lymphs Abs: 1.5 10*3/uL (ref 0.7–4.0)
MCV: 91 fL (ref 78.0–100.0)
Monocytes Absolute: 0.5 10*3/uL (ref 0.1–1.0)
Monocytes Relative: 7.7 % (ref 3.0–12.0)
Neutrophils Relative %: 57.5 % (ref 43.0–77.0)
Nitrite: NEGATIVE
Platelets: 164 10*3/uL (ref 150–400)
Platelets: 199 10*3/uL (ref 150.0–400.0)
Potassium: 4.3 meq/L (ref 3.5–5.1)
Potassium: 4.5 meq/L (ref 3.5–5.1)
RDW: 12.6 % (ref 11.5–14.6)
Sodium: 142 meq/L (ref 135–145)
Specific Gravity, Urine: 1.01 (ref 1.000–1.030)
TSH: 1.58 microintl units/mL (ref 0.35–5.50)
Total Protein, Urine: NEGATIVE mg/dL
Urine Glucose: NEGATIVE mg/dL
Urobilinogen, UA: 0.2 (ref 0.0–1.0)
WBC: 5.7 10*3/uL (ref 4.5–10.5)
pH: 6 (ref 5.0–8.0)

## 2010-10-05 NOTE — Progress Notes (Signed)
Summary: Cal Report  Phone Note Other Incoming   Caller: [place] Call Report Summary of Call: [facility] Triage Call Report Triage Record Num: [id number] Operator: Leota Sauers Patient Name: [name] [name] Call Date & Time: 01/09/2010 8:46:38AM Patient Phone: [phone] PCP: Walker Kehr Patient Gender: Female PCP Fax : [phone] Patient DOB: April 18, 1933 [facility] Name[facility] Reason for Call: Pt. calling; states she has been on Rx. for UTI from UC since 5/1. Macrobid causes nausea. Urinary sx. persist. Advised see MD within 24 hours per Urinary Sx. protocol; office opens at 0900. Protocol(s) Used: Urinary Symptoms - Female Recommended Outcome per Protocol: See Provider within 24 hours Reason for Outcome: Urinary tract symptoms Care Advice: Limit carbonated, alcoholic, and caffeinated beverages such as coffee, tea and soda. Avoid OTC cold and allergy medications that contain caffeine. Limit intake of tomatoes, fruit juices (except for unsweetened cranberry juice), dairy products, spicy foods, sugar, and artificial sweeteners (aspartame or saccharine). Stop or decrease smoking. Reducing exposure to bladder irritants may help lessen urgency.  ~  ~ SYMPTOM / CONDITION MANAGEMENT 05/ Initial call taken by: Crissie Sickles, [place],  Jan 11, 2010 8:54 AM  Follow-up for Phone Call        Noted Follow-up by: Cassandria Anger MD,  Jan 11, 2010 10:58 PM

## 2010-10-05 NOTE — Progress Notes (Signed)
Summary: low heart rate,. b/p issues    Phone Note Call from Patient Call back at Home Phone [phone]   Caller: Patient Reason for Call: Talk to Nurse Details for Reason: Per pt calling, c/o low heart rate, for the past 2 week have been in er. pt saw dr. Zenia Resides in [place]. flutter in chest, b/p has been high lately. today reading is 137/67 hr 71. pt would like to be seen today if possible.  Initial call taken by: Neil Crouch,  November 02, 2009 8:51 AM  Follow-up for Phone Call        S/W pt and she wants to see Dr. Verl Blalock because a friend of her's sees him and recommended him. Pt is having palpitations and has had since Oct. She sees Dr. Zenia Resides and Dr. Alain Marion. She was in the ER on Friday with these palps, HTN and Bradycardia. I s/w Altha Harm, RN who said Dr. Verl Blalock isn't taking new pt's. I ask her if she would like to see someone else in the practice and she wants to s/w Dr. Alain Marion for his recommendation of someone else in the practice. She or Dr. Alain Marion will contact us for an appointment at that time.  Follow-up by: Barnett Abu, RN, BSN,  November 02, 2009 9:47 AM     Appended Document: low heart rate,. b/p issues   Reviewed Mar Daring, MD

## 2010-10-05 NOTE — Assessment & Plan Note (Signed)
Summary: not feeling well ?due to depression/anxiety meds/cd   Vital Signs:  Patient profile:   77 year old female Height:      62 inches Weight:      147 pounds BMI:     26.98 Temp:     97.8 degrees F oral Pulse rate:   68 / minute Pulse rhythm:   regular Resp:     16 per minute BP sitting:   124 / 86  (left arm) Cuff size:   regular  Vitals Entered By: Jonathon Resides, Gabrielle Dare) (August 02, 2010 4:14 PM) CC: not feeling well, shakes Is Patient Diabetic? No Comments pt is not taking promethazine   Primary Care Provider:  Dr. Lew Dawes  CC:  not feeling well and shakes.  History of Present Illness: C/o not feeling well - shaky from Paxil at times. C/o depression F/u HTN, anxiety C/o growth in the mouth w/recent pain on R  Current Medications (verified): 1)  Levothyroxine Sodium 88 Mcg  Tabs (Levothyroxine Sodium) .Marland Kitchen.. 1 By Mouth Daily 2)  Cyanocobalamin 1000 Mcg/ml Inj Soln (Cyanocobalamin) .Marland Kitchen.. 1000 Mcg Q 2 Weeks 3)  Vitamin D3 1000 Unit  Tabs (Cholecalciferol) .Marland Kitchen.. 1 By Mouth Daily 4)  Lovastatin 40 Mg Tabs (Lovastatin) .... Once Daily 5)  Folic Acid 1 Mg Tabs (Folic Acid) .... Once Daily 6)  Aspirin 81 Mg Tbec (Aspirin) .Marland Kitchen.. 1 Once Daily 7)  Hydrochlorothiazide 25 Mg Tabs (Hydrochlorothiazide) .... Take One Tablet By Mouth Daily 8)  Lorazepam 0.5 Mg Tabs (Lorazepam) .Marland Kitchen.. 1 By Mouth Two Times A Day As Needed Anxiety 9)  Promethazine Hcl 25 Mg Tabs (Promethazine Hcl) .... One Tab By Mouth Every 6 Hrs As Needed For Nausea. 10)  Paroxetine Hcl 10 Mg Tabs (Paroxetine Hcl) .Marland Kitchen.. 1 By Mouth Once Daily For Anxiety/depression  Allergies (verified): 1)  ! Sulfadiazine (Sulfadiazine) 2)  Norvasc 3)  Macrobid (Nitrofurantoin Monohyd Macro) 4)  Cefuroxime Axetil (Cefuroxime Axetil)  Past History:  Social History: Last updated: 08/02/2010 Retired Married Never Smoked Alcohol use-no  Past Medical History:   DYSPNEA (ICD-786.05) with atypical upper airway  symptoms ? all vcd/lpr/gerd     - See pulmonary eval September 17, 2009 > try off biphosphonates PALPITATIONS (ICD-785.1) BREAST PAIN, RIGHT (ICD-611.71) BREAST PAIN (ICD-611.71) VASOVAGAL SYNCOPE (ICD-780.2) PARONYCHIA (ICD-681.9) URINARY TRACT INFECTION (UTI) (ICD-599.0) URINARY URGENCY (ICD-788.63) OTHER SYMPTOMS INVOLVING ABDOMEN AND PELVIS (ICD-789.9) RASH AND OTHER NONSPECIFIC SKIN ERUPTION (ICD-782.1) VENOUS INSUFFICIENCY (ICD-459.81) EDEMA (ICD-782.3) HYPERLIPIDEMIA (ICD-272.4) OSTEOARTHRITIS (ICD-715.90) MONOCLONAL GAMMOPATHY (ICD-273.1) ANEMIA-NOS (ICD-285.9) IBS (ICD-564.1) B12 DEFICIENCY (ICD-266.2) HYPOTHYROIDISM (ICD-244.9) HYPERTENSION (ICD-401.9) GERD    - EGD  1993  LES incompetence, 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  The patient denies fever, chest pain, and dyspnea on exertion.    Physical Exam  General:  Well-developed,well-nourished,in no acute distress; alert,appropriate and cooperative throughout examination, nontoxic and apears younger than her stated age. Head:  Normocephalic and atraumatic without obvious abnormalities. No apparent alopecia or balding. Eyes:  Conjunctiva clear bilaterally.  Ears:  External ear exam shows no significant lesions or deformities.  Otoscopic examination reveals clear canals, tympanic membranes are intact bilaterally without bulging, retraction, inflammation or discharge. Hearing is grossly normal bilaterally. Nose:  External nasal examination shows no deformity or inflammation. Nasal mucosa are pink and moist without lesions or exudates. Mouth:  Oral mucosa and oropharynx with torus palatinus which is tender  on R where the area of inflamation is noted 7 mm.  Teeth in good repair. Lungs:  Normal respiratory effort, chest expands symmetrically. Lungs are clear to auscultation, no crackles  or wheezes. Heart:  Normal rate and regular rhythm. S1 and S2 normal without gallop, murmur, click, rub or other extra sounds. Abdomen:  No suprapubic tenderness Msk:  WNL Neurologic:  No cranial nerve deficits noted. Station and gait are normal. Plantar reflexes are down-going bilaterally. DTRs are symmetrical throughout. Sensory, motor and coordinative functions appear intact. Psych:  Oriented X3, depressed affect, tearful, and slightly anxious.  not suicidal.     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 1 by mouth once daily for anxiety/depression Her updated medication list for this problem includes:    Lorazepam 0.5 Mg Tabs (Lorazepam) .Marland Kitchen... 1 by mouth two times a day as needed anxiety    Citalopram Hydrobromide 20 Mg Tabs (Citalopram hydrobromide) .Marland Kitchen... 1 by mouth once daily for depression  Problem # 5:  HYPERTENSION (ICD-401.9) Assessment: Unchanged  Her updated medication list for this problem includes:    Hydrochlorothiazide 25 Mg Tabs (Hydrochlorothiazide) .Marland Kitchen... Take one tablet by mouth daily  Complete Medication List: 1)  Levothyroxine Sodium 88 Mcg Tabs (Levothyroxine sodium) .Marland Kitchen.. 1 by mouth daily 2)  Cyanocobalamin 1000 Mcg/ml Inj Soln (Cyanocobalamin) .Marland Kitchen.. 1000 mcg q 2 weeks 3)  Vitamin D3 1000 Unit Tabs (Cholecalciferol) .Marland Kitchen.. 1 by mouth daily 4)  Lovastatin 40 Mg Tabs (Lovastatin) .... Once daily 5)  Folic Acid 1 Mg Tabs (Folic acid) .... Once daily 6)  Aspirin 81 Mg Tbec (Aspirin) .Marland Kitchen.. 1  once daily 7)  Hydrochlorothiazide 25 Mg Tabs (Hydrochlorothiazide) .... Take one tablet by mouth daily 8)  Lorazepam 0.5 Mg Tabs (Lorazepam) .Marland Kitchen.. 1 by mouth two times a day as needed anxiety 9)  Promethazine Hcl 25 Mg Tabs (Promethazine hcl) .... One tab by mouth every 6 hrs as needed for nausea. 10)  Citalopram Hydrobromide 20 Mg Tabs (Citalopram hydrobromide) .Marland Kitchen.. 1 by mouth once daily for depression 11)  Doxycycline Hyclate 100 Mg Caps (Doxycycline hyclate) .Marland Kitchen.. 1 by mouth two times a day with a glass of water 12)  Majic Mouthwash  .... 5 ml by mouth qid swish, hold and swallow  Patient Instructions: 1)  Stop Paroxetine. Take Citalopram next day. 2)  Please schedule a follow-up appointment in 6 weeks. Prescriptions: MAJIC MOUTHWASH 5 ml by mouth qid swish, hold and swallow  #300 ml x 1   Entered and Authorized by:   Cassandria Anger MD   Signed by:   Cassandria Anger MD on 08/02/2010   Method used:   Print then Give to Patient   RxID:   [id number] DOXYCYCLINE HYCLATE 100 MG CAPS (DOXYCYCLINE HYCLATE) 1 by mouth two times a day with a glass of water  #20 x 0   Entered and Authorized by:   Cassandria Anger MD   Signed by:   Cassandria Anger MD on 08/02/2010   Method used:   Print then Give to Patient   RxID:  [id number] CITALOPRAM HYDROBROMIDE 20 MG TABS (CITALOPRAM HYDROBROMIDE) 1 by mouth once daily for depression  #30 x 6   Entered and Authorized by:   Cassandria Anger MD   Signed by:   Cassandria Anger MD on 08/02/2010   Method used:   Print then Give to Patient   RxID:   [id number]    Orders Added: 1)  Est. Patient Level IV [id number]   Immunization History:  Influenza Immunization History:    Influenza:  historical (06/24/2010)   Immunization History:  Influenza Immunization History:    Influenza:  Historical (06/24/2010)

## 2010-10-05 NOTE — Progress Notes (Signed)
Summary: Call Report  Phone Note Other Incoming   Caller: [facility] Call Report Summary of Call: [facility] Triage Call Report Triage Record Num: [id number] Operator: April Finney Patient Name: [name] [name] Call Date & Time: 01/02/2010 11:26:22AM Patient Phone: [phone] PCP: Walker Kehr Patient Gender: Female PCP Fax : [phone] Patient DOB: Oct 05, 1932 [facility] Name[facility] Reason for Call: [name] calling about burning, itching and urinary frequency. Onset 12/31/09. History of UTI's. Afebrile. No blood. No flank pain. Has voided in past 8hrs. See in 24 hrs care advice given. Transfered to [place] in office for appt. Protocol(s) Used: Urinary Symptoms - Female Recommended Outcome per Protocol: See Provider within 24 hours Reason for Outcome: Genital itching and redness of perineum Care Advice: Take showers rather than baths. Do not use bubble baths or bath oils. Do not douche. Avoid feminine hygiene sprays or deodorants.  ~  ~ Keep perineum clean and dry.  ~ Call provider if you develop flank or low back pain, fever, generally feel sick.  ~ Some individuals benefit from fluids such as cranberry juice. Keep the area around the genitals clean using nonbacterial, nonperfumed mild soap (Dove for sensitive skin, Neutrogena, Pears or Basis) or no soap using just warm water.  ~ Apply cool compresses to affected area(s) for 20 minutes 4 to 6 times daily to help relieve itching or try a baking soda sitz bath with 4-5 tablespoons of baking soda in enough water to cover the genital area.  ~ Drink 6-10 eight ounce glasses (1.2-2.0 liters) of fluids per day unless previously instructed to restrict fluid intake for other medical reasons. Limit fluids that contain caffeine, sugar or alcohol.  ~  ~ Refrain from any sexual activity, douching, tampon use or OTC intravaginal medication until evaluated by provider.  ~ [place] / MAINTENANCE  ~ SYMPTOM / CONDITION  MANAGEMENT  ~ CAUTIONS 04/ Initial call taken by: Crissie Sickles, [place],  Jan 04, 2010 8:21 AM  Follow-up for Phone Call        OV w/any MD if sick Follow-up by: Cassandria Anger MD,  Jan 04, 2010 12:58 PM  Additional Follow-up for Phone Call Additional follow up Details #1::        SEE other phone note, pt has apt this wk, was advised UC Additional Follow-up by: Charlsie Quest, CMA,  Jan 04, 2010 4:15 PM

## 2010-10-05 NOTE — Progress Notes (Signed)
Summary: Urinary Complaints -OV?  Phone Note Call from Patient   Summary of Call: Pt c/o urinary frequency, burning, and vaginal itching x 3 days. No fever, low back pain, pelvic pressure or other complaints. Pt is approx 1 hour away from office. She wants to know if it is ok to go to the local UC for eval.  Dr Sarajane Jews informed and advised ok to go to UC, keep f/u w/Plotnikov on Wednesday.   Pt informed and will call office back w/any further concerns.   Initial call taken by: Charlsie Quest, [place],  January 02, 2010 11:48 AM  Follow-up for Phone Call        as above Follow-up by: Laurey Morale MD,  January 02, 2010 12:03 PM

## 2010-10-05 NOTE — Progress Notes (Signed)
Summary: Med confusion  Phone Note Call from Patient   Summary of Call: Pt was on lorazepam 1 in am and 1 at lunch. She then would take paroxetine in the evening. Pt has been very sleepy. She decided to stop paroxetine thinking this was the cause. She saw no change in symptoms. Advised pt that paroxetine was to take daily and lorazepam was only as needed for anxiety. She will take paroxetine once daily and lorazepam as needed. Pt will call office back with any further questions or concerns.  Initial call taken by: Charlsie Quest, [place],  March 04, 2010 5:14 PM  Follow-up for Phone Call        agree. Thx Follow-up by: Cassandria Anger MD,  March 05, 2010 4:15 PM

## 2010-10-05 NOTE — Letter (Signed)
Summary: [facility]   Imported By: Phillis Knack 03/15/2010 11:53:05  _____________________________________________________________________  External Attachment:    Type:   Image     Comment:   External Document

## 2010-10-05 NOTE — Letter (Signed)
Summary: [facility]  [facility]   Imported By: Phillis Knack 05/04/2010 13:54:14  _____________________________________________________________________  External Attachment:    Type:   Image     Comment:   External Document

## 2010-10-05 NOTE — Procedures (Signed)
Summary: [facility]  [facility]   Imported By: Phillis Knack 03/10/2010 11:47:39  _____________________________________________________________________  External Attachment:    Type:   Image     Comment:   External Document

## 2010-10-05 NOTE — Assessment & Plan Note (Signed)
Summary: sore breast-oyu   Vital Signs:  Patient profile:   77 year old female Height:      62 inches Weight:      147.38 pounds Temp:     98.6 degrees F oral Pulse rate:   76 / minute Resp:     20 per minute BP supine:   130 / 76  (left arm)  Primary Care Provider:  Dr. Tyrone Apple Plotnikov   History of Present Illness: C/o R breast discomfort x 1-2 months . Advil helped. She had a mammo - last Aug. She has been tense and tearful; unhappy  Allergies: 1)  ! Sulfadiazine (Sulfadiazine) 2)  Norvasc 3)  Macrobid (Nitrofurantoin Monohyd Macro) 4)  Cefuroxime Axetil (Cefuroxime Axetil)  Past History:  Social History: Last updated: 07/04/2007 Retired Married Never Smoked Alcohol use-no  Past Medical History:   DYSPNEA (ICD-786.05) with atypical upper airway symptoms ? all vcd/lpr/gerd     - See pulmonary eval September 17, 2009 > try off biphosphonates PALPITATIONS (ICD-785.1) BREAST PAIN, RIGHT (ICD-611.71) BREAST PAIN (ICD-611.71) VASOVAGAL SYNCOPE (ICD-780.2) PARONYCHIA (ICD-681.9) URINARY TRACT INFECTION (UTI) (ICD-599.0) URINARY URGENCY (ICD-788.63) OTHER SYMPTOMS INVOLVING ABDOMEN AND PELVIS (ICD-789.9) RASH AND OTHER NONSPECIFIC SKIN ERUPTION (ICD-782.1) VENOUS INSUFFICIENCY (ICD-459.81) EDEMA (ICD-782.3) HYPERLIPIDEMIA (ICD-272.4) OSTEOARTHRITIS (ICD-715.90) MONOCLONAL GAMMOPATHY (ICD-273.1) ANEMIA-NOS (ICD-285.9) IBS (ICD-564.1) B12 DEFICIENCY (ICD-266.2) HYPOTHYROIDISM (ICD-244.9) HYPERTENSION (ICD-401.9) GERD    - EGD  1993  LES incompetence, es stricture ([facility]) Anxiety  Review of Systems  The patient denies fever, dyspnea on exertion, abdominal pain, hematochezia, abnormal bleeding, and breast masses.    Physical Exam  General:  Well-developed,well-nourished,in no acute distress; alert,appropriate and cooperative throughout examination, nontoxic and apears younger than her stated age. Eyes:  Conjunctiva clear bilaterally.  Nose:  External nasal  examination shows no deformity or inflammation. Nasal mucosa are pink and moist without lesions or exudates. Mouth:  Oral mucosa and oropharynx without lesions or exudates.  Teeth in good repair. Chest Wall:  No deformities, masses, or tenderness noted. Breasts:  No mass, nodules, thickening, tenderness, bulging, retraction, inflamation, nipple discharge or skin changes noted.   Lungs:  Normal respiratory effort, chest expands symmetrically. Lungs are clear to auscultation, no crackles or wheezes. Heart:  Normal rate and regular rhythm. S1 and S2 normal without gallop, murmur, click, rub or other extra sounds. Abdomen:  No suprapubic tenderness Msk:  WNL Neurologic:  No cranial nerve deficits noted. Station and gait are normal. Plantar reflexes are down-going bilaterally. DTRs are symmetrical throughout. Sensory, motor and coordinative functions appear intact. Psych:  Oriented X3, depressed affect, tearful, and slightly anxious.     Impression & Recommendations:  Problem # 1:  BREAST PAIN (ICD-611.71) L Exam - WNL  Problem # 2:  ANXIETY (ICD-300.00) Assessment: Deteriorated  Her updated medication list for this problem includes:    Lorazepam 0.5 Mg Tabs (Lorazepam) .Marland Kitchen... 1 by mouth two times a day as needed anxiety    Paroxetine Hcl 10 Mg Tabs (Paroxetine hcl) .Marland Kitchen... 1 by mouth once daily for anxiety/depression  Orders: Prescription Created Electronically [phone])  Problem # 3:  PALPITATIONS (ICD-785.1) Assessment: Unchanged  Orders: EKG w/ Interpretation (93000) - PVCs  Problem # 4:  DEPRESSION/ANXIETY (ICD-300.4) Assessment: Deteriorated Start Paxil  Problem # 5:  HYPERTENSION (ICD-401.9) Assessment: Unchanged  Her updated medication list for this problem includes:    Hydrochlorothiazide 25 Mg Tabs (Hydrochlorothiazide) .Marland Kitchen... Take one tablet by mouth daily  Complete Medication List: 1)  Levothyroxine Sodium 88 Mcg Tabs (Levothyroxine sodium) .Marland Kitchen.. 1 by  mouth daily 2)   Cyanocobalamin 1000 Mcg/ml Inj Soln (Cyanocobalamin) .Marland Kitchen.. 1000 mcg q 2 weeks 3)  Vitamin D3 1000 Unit Tabs (Cholecalciferol) .Marland Kitchen.. 1 by mouth daily 4)  Lovastatin 40 Mg Tabs (Lovastatin) .... Once daily 5)  Folic Acid 1 Mg Tabs (Folic acid) .... Once daily 6)  Aspirin 81 Mg Tbec (Aspirin) .Marland Kitchen.. 1 once daily 7)  Hydrochlorothiazide 25 Mg Tabs (Hydrochlorothiazide) .... Take one tablet by mouth daily 8)  Lorazepam 0.5 Mg Tabs (Lorazepam) .Marland Kitchen.. 1 by mouth two times a day as needed anxiety 9)  Promethazine Hcl 25 Mg Tabs (Promethazine hcl) .... One tab by mouth every 6 hrs as needed for nausea. 10)  Paroxetine Hcl 10 Mg Tabs (Paroxetine hcl) .Marland Kitchen.. 1 by mouth once daily for anxiety/depression  Patient Instructions: 1)  Please schedule a follow-up appointment in 6 weeks. Prescriptions: PAROXETINE HCL 10 MG TABS (PAROXETINE HCL) 1 by mouth once daily for anxiety/depression  #30 x 6   Entered and Authorized by:   Cassandria Anger MD   Signed by:   Cassandria Anger MD on 02/12/2010   Method used:   Electronically to        [id number] (retail)       [address], [place]  [address]       Ph: [phone] or [phone]       Fax: [phone]   RxID:   [id number]   Appended Document: sore breast-oyu Mammo due in Aug 2011. She declined early mammo/US.

## 2010-10-05 NOTE — Assessment & Plan Note (Signed)
Summary: 3 mos f/u #/cd   Vital Signs:  Patient profile:   77 year old female Height:      62 inches Weight:      150.25 pounds BMI:     27.58 O2 Sat:      96 % on Room air Temp:     98.2 degrees F oral Pulse rate:   71 / minute BP sitting:   134 / 70  (left arm) Cuff size:   regular  Vitals Entered By: Ernestene Mention (Jan 14, 2010 10:01 AM)  O2 Flow:  Room air CC: Follow-up visit./kb Is Patient Diabetic? No Pain Assessment Patient in pain? no        Primary Care Provider:  Dr. Tyrone Apple [place]  CC:  Follow-up visit./kb.  History of Present Illness: F/u UTI, palpitations, CP, anxiety and GERD  Current Medications (verified): 1)  Levothyroxine Sodium 88 Mcg  Tabs (Levothyroxine Sodium) .Marland Kitchen.. 1 By Mouth Daily 2)  Cyanocobalamin 1000 Mcg/ml Inj Soln (Cyanocobalamin) .Marland Kitchen.. 1000 Mcg Q 2 Weeks 3)  Vitamin D3 1000 Unit  Tabs (Cholecalciferol) .Marland Kitchen.. 1 By Mouth Daily 4)  Lovastatin 40 Mg Tabs (Lovastatin) .... Once Daily 5)  Folic Acid 1 Mg Tabs (Folic Acid) .... Once Daily 6)  Aspirin 81 Mg Tbec (Aspirin) .Marland Kitchen.. 1 Once Daily 7)  Hydrochlorothiazide 25 Mg Tabs (Hydrochlorothiazide) .... Take One Tablet By Mouth Daily 8)  Lorazepam 0.5 Mg Tabs (Lorazepam) .Marland Kitchen.. 1 By Mouth Two Times A Day As Needed Anxiety 9)  Amoxicillin-Pot Clavulanate 250-125 Mg Tabs (Amoxicillin-Pot Clavulanate) .... Onr Tab By Mouth Three Times A Day 10)  Fluconazole 150 Mg Tabs (Fluconazole) .... One Tab By Mouth At End of Augmentin For Yeast Infection. 11)  Promethazine Hcl 25 Mg Tabs (Promethazine Hcl) .... One Tab By Mouth Every 6 Hrs As Needed For Nausea.  Allergies (verified): 1)  ! Sulfadiazine (Sulfadiazine) 2)  Norvasc 3)  Macrobid (Nitrofurantoin Monohyd Macro) 4)  Cefuroxime Axetil (Cefuroxime Axetil)  Past History:  Past Medical History: Last updated: 09/17/2009   DYSPNEA (ICD-786.05) with atypical upper airway symptoms ? all vcd/lpr/gerd     - See pulmonary eval September 17, 2009 > try off  biphosphonates PALPITATIONS (ICD-785.1) BREAST PAIN, RIGHT (ICD-611.71) BREAST PAIN (ICD-611.71) VASOVAGAL SYNCOPE (ICD-780.2) PARONYCHIA (ICD-681.9) URINARY TRACT INFECTION (UTI) (ICD-599.0) URINARY URGENCY (ICD-788.63) OTHER SYMPTOMS INVOLVING ABDOMEN AND PELVIS (ICD-789.9) RASH AND OTHER NONSPECIFIC SKIN ERUPTION (ICD-782.1) VENOUS INSUFFICIENCY (ICD-459.81) EDEMA (ICD-782.3) HYPERLIPIDEMIA (ICD-272.4) OSTEOARTHRITIS (ICD-715.90) MONOCLONAL GAMMOPATHY (ICD-273.1) ANEMIA-NOS (ICD-285.9) IBS (ICD-564.1) B12 DEFICIENCY (ICD-266.2) HYPOTHYROIDISM (ICD-244.9) HYPERTENSION (ICD-401.9) GERD    - EGD  1993  LES incompetence, es stricture (Medoff)  Social History: Last updated: 07/04/2007 Retired Married Never Smoked Alcohol use-no  Review of Systems       The patient complains of depression.  The patient denies weight loss, dyspnea on exertion, and abdominal pain.    Physical Exam  General:  Well-developed,well-nourished,in no acute distress; alert,appropriate and cooperative throughout examination, nontoxic and apears younger than her stated age. Mouth:  Oral mucosa and oropharynx without lesions or exudates.  Teeth in good repair. Lungs:  Normal respiratory effort, chest expands symmetrically. Lungs are clear to auscultation, no crackles or wheezes. Heart:  Normal rate and regular rhythm. S1 and S2 normal without gallop, murmur, click, rub or other extra sounds. Abdomen:  No suprapubic tenderness Msk:  No CVAT Extremities:  trace left pedal edema and trace right pedal edema.   Neurologic:  No cranial nerve deficits noted. Station and gait are normal. Plantar reflexes  are down-going bilaterally. DTRs are symmetrical throughout. Sensory, motor and coordinative functions appear intact. Psych:  Oriented X3, not depressed appearing, and slightly anxious.     Impression & Recommendations:  Problem # 1:  URINARY TRACT INFECTION (UTI) (ICD-599.0) Assessment Comment Only  The  following medications were removed from the medication list:    Amoxicillin-pot Clavulanate 250-125 Mg Tabs (Amoxicillin-pot clavulanate) ..... Onr tab by mouth three times a day  Problem # 2:  DYSPNEA (ICD-786.05) multifactorial Assessment: Improved  Problem # 3:  B12 DEFICIENCY (ICD-266.2) Assessment: Comment Only On prescription drug  therapy   Problem # 4:  EDEMA (ICD-782.3) Assessment: Improved  Her updated medication list for this problem includes:    Hydrochlorothiazide 25 Mg Tabs (Hydrochlorothiazide) .Marland Kitchen... Take one tablet by mouth daily  Problem # 5:  PALPITATIONS (ICD-785.1) Assessment: Improved  Complete Medication List: 1)  Levothyroxine Sodium 88 Mcg Tabs (Levothyroxine sodium) .Marland Kitchen.. 1 by mouth daily 2)  Cyanocobalamin 1000 Mcg/ml Inj Soln (Cyanocobalamin) .Marland Kitchen.. 1000 mcg q 2 weeks 3)  Vitamin D3 1000 Unit Tabs (Cholecalciferol) .Marland Kitchen.. 1 by mouth daily 4)  Lovastatin 40 Mg Tabs (Lovastatin) .... Once daily 5)  Folic Acid 1 Mg Tabs (Folic acid) .... Once daily 6)  Aspirin 81 Mg Tbec (Aspirin) .Marland Kitchen.. 1 once daily 7)  Hydrochlorothiazide 25 Mg Tabs (Hydrochlorothiazide) .... Take one tablet by mouth daily 8)  Lorazepam 0.5 Mg Tabs (Lorazepam) .Marland Kitchen.. 1 by mouth two times a day as needed anxiety 9)  Fluconazole 150 Mg Tabs (Fluconazole) .... One tab by mouth at end of augmentin for yeast infection. 10)  Promethazine Hcl 25 Mg Tabs (Promethazine hcl) .... One tab by mouth every 6 hrs as needed for nausea. 11)  Fosamax 35 Mg Tabs (Alendronate sodium) .Marland Kitchen.. 1 by mouth q 1 wk  Patient Instructions: 1)  Please schedule a follow-up appointment in 3 months. 2)  BMP prior to visit, ICD-9: 3)  Hepatic Panel prior to visit, ICD-9: 4)  Lipid Panel prior to visit, ICD-9: 5)  Urine-dip prior to visit, ICD-9:401.1 6)  TSH prior to visit, ICD-9: Prescriptions: HYDROCHLOROTHIAZIDE 25 MG TABS (HYDROCHLOROTHIAZIDE) Take one tablet by mouth daily  #90 x 3   Entered and Authorized by:   Cassandria Anger MD   Signed by:   Cassandria Anger MD on 01/14/2010   Method used:   [id number]       [address], [place]  [address]       Ph: [phone] or [phone]       Fax: [phone]   RxID:   [id number] FOSAMAX 35 MG TABS (ALENDRONATE SODIUM) 1 by mouth q 1 wk  #12 x 3   Entered and Authorized by:   Cassandria Anger MD   Signed by:   Cassandria Anger MD on 01/14/2010   Method used:   Electronically to        [facility] (mail-order)             ,          Ph: [phone]       Fax: [phone]   [id number] FOSAMAX 35 MG TABS (ALENDRONATE SODIUM) 1 by mouth q 1 wk  #12 x 3   Entered and Authorized by:   Cassandria Anger MD   Signed by:   Cassandria Anger MD on  01/14/2010   Method used:   Print then Give to Patient   RxID:   [id number] CEFTIN 250 MG TABS (CEFUROXIME AXETIL) 1 by mouth two times a day for bladder infection  #10 x 2   Entered and Authorized by:   Cassandria Anger MD   Signed by:   Cassandria Anger MD on 01/14/2010   Method used:   [facility]        [id number]       [address], [place]  [address]       Ph: [phone] or [phone]       Fax: [phone]   RxID:   [id number]

## 2010-10-05 NOTE — Progress Notes (Signed)
Summary: Rf Lorazepam  Phone Note Refill Request Message from:  Fax from Pharmacy  Refills Requested: Medication #1:  LORAZEPAM 0.5 MG TABS 1 by mouth two times a day as needed anxiety   Dosage confirmed as above?Dosage Confirmed   Supply Requested: 60   Last Refilled: 01/16/2010  Method Requested: Telephone to Pharmacy Next Appointment Scheduled: None Initial call taken by: Jonathon Resides, [facility]),  June 08, 2010 8:23 AM  Follow-up for Phone Call        ok x 6  Follow-up by: Cassandria Anger MD,  June 08, 2010 12:53 PM  Additional Follow-up for Phone Call Additional follow up Details #1::        Rx called to pharmacy Additional Follow-up by: Jonathon Resides, [facility]),  June 08, 2010 3:51 PM    Prescriptions: LORAZEPAM 0.5 MG TABS (LORAZEPAM) 1 by mouth two times a day as needed anxiety  #60 x 5   Entered by:   Jonathon Resides, [facility])   Authorized by:   Cassandria Anger MD   Signed by:   Jonathon Resides, CMA(AAMA) on 06/08/2010   Method used:   Telephoned to ...       [id number]       [address], [place]  [address]       Ph: [phone] or [phone]       Fax: [phone]   RxID:   [id number]

## 2010-10-05 NOTE — Procedures (Signed)
Summary: Colonoscopy/[facility]  Colonoscopy/[facility]   Imported By: Phillis Knack 03/10/2010 11:46:22  _____________________________________________________________________  External Attachment:    Type:   Image     Comment:   External Document

## 2010-10-05 NOTE — Miscellaneous (Signed)
Summary: Orders Update  Clinical Lists Changes  Orders: Added new Test order of Renal Artery Duplex (Renal Artery Duplex) - Signed 

## 2010-10-05 NOTE — Assessment & Plan Note (Signed)
°  Medications Added ENALAPRIL MALEATE 5 MG TABS (ENALAPRIL MALEATE)  LEVOTHYROXINE SODIUM 100 MCG TABS (LEVOTHYROXINE SODIUM)  LOVASTATIN 40 MG TABS (LOVASTATIN)         Vital Signs:  Patient Profile:   77 Years Old Female Weight:      150 pounds Temp:     97.5 degrees F oral Pulse rate:   74 / minute BP sitting:   154 / 64  (left arm)  Vitals Entered By: Doralee Albino (July 04, 2007 2:25 PM)                 History of Present Illness: The patient presents for a follow up of hypertension, OA, hyperlipidemia     Past Medical History:    Hypertension    Hypothyroidism    Anemia-NOS    MGUS    Osteoarthritis    Hyperlipidemia   Family History:    Family History Hypertension  Social History:    Retired    Married    Never Smoked    Alcohol use-no   Risk Factors:  Tobacco use:  never Alcohol use:  no    Physical Exam  General:     Well-developed,well-nourished,in no acute distress; alert,appropriate and cooperative throughout examination Head:     Normocephalic and atraumatic without obvious abnormalities. No apparent alopecia or balding. Ears:     External ear exam shows no significant lesions or deformities.  Otoscopic examination reveals clear canals, tympanic membranes are intact bilaterally without bulging, retraction, inflammation or discharge. Hearing is grossly normal bilaterally. Nose:     External nasal examination shows no deformity or inflammation. Nasal mucosa are pink and moist without lesions or exudates. Mouth:     Oral mucosa and oropharynx without lesions or exudates.  Teeth in good repair. Neck:     No deformities, masses, or tenderness noted. Lungs:     Normal respiratory effort, chest expands symmetrically. Lungs are clear to auscultation, no crackles or wheezes. Heart:     Normal rate and regular rhythm. S1 and S2 normal without gallop, murmur, click, rub or other extra sounds. Abdomen:     Bowel sounds positive,abdomen  soft and non-tender without masses, organomegaly or hernias noted. Msk:     No deformity or scoliosis noted of thoracic or lumbar spine.   Extremities:     No clubbing, cyanosis, edema, or deformity noted with normal full range of motion of all joints.   Neurologic:     No cranial nerve deficits noted. Station and gait are normal. Plantar reflexes are down-going bilaterally. DTRs are symmetrical throughout. Sensory, motor and coordinative functions appear intact. Skin:     Intact without suspicious lesions or rashes Psych:     slightly anxious.      Impression & Recommendations:  Problem # 1:  OSTEOARTHRITIS (ICD-715.90) Assessment: Unchanged  Problem # 2:  MONOCLONAL GAMMOPATHY (ICD-273.1) Assessment: Unchanged  Problem # 3:  HYPERLIPIDEMIA (B2193296.4) Assessment: Unchanged  Her updated medication list for this problem includes:    Lovastatin 40 Mg Tabs (Lovastatin)   Problem # 4:  HYPOTHYROIDISM (ICD-244.9) Assessment: Unchanged  Her updated medication list for this problem includes:    Levothyroxine Sodium 100 Mcg Tabs (Levothyroxine sodium)   Complete Medication List: 1)  Enalapril Maleate 5 Mg Tabs (Enalapril maleate) 2)  Levothyroxine Sodium 100 Mcg Tabs (Levothyroxine sodium) 3)  Lovastatin 40 Mg Tabs (Lovastatin)   Patient Instructions: 1)  Please schedule a follow-up appointment in 3 months.    ]

## 2010-10-05 NOTE — Progress Notes (Signed)
Summary: BP PROBLEM  Phone Note Call from Patient Call back at Home Phone [phone]   Caller: Patient Call For: Dr Alain Marion Summary of Call: Pt's blood pressure has been going up real high recently. Pt'f friend who is a nurse saw her over the weekend, her reading was 199/90. Pt wants a call to explain this to Dr Alain Marion. Pt states she spoke w/Dr Plotnikov last week and is aware of the situation. Please advise. Initial call taken by: Denice Paradise,  November 09, 2009 9:11 AM Call placed by: Rhett Bannister LPN Call placed to: Patient Summary of Call: [facility] notifying patient per Dr. Zenia Resides, lab results and results of urine tests were all normal.  If any questions, to call the office.  Follow-up for Phone Call        Pt c/o increase in weakness. This am bp 117/51. Pt says when her bp is elevated she feels a "cold feeling" in her head. She is upset and emotional about weakness and bp. Pt's cardiologist is not in the office Mondays. Please advise.  Follow-up by: Charlsie Quest, [place],  November 09, 2009 10:51 AM  Additional Follow-up for Phone Call Additional follow up Details #1::        hold lovastatin Additional Follow-up by: Cassandria Anger MD,  November 09, 2009 5:43 PM    Additional Follow-up for Phone Call Additional follow up Details #2::    I called pt. I read Dr Ayesha Rumpf letter w/pt. [place] is pending and 24h urine is pending . Try lorazepam as needed - ? stress. Call if problems. Keep return office visit w/Dr Charlyn Minerva is pending  Follow-up by: Cassandria Anger MD,  November 09, 2009 5:47 PM  Additional Follow-up for Phone Call Additional follow up Details #3:: Details for Additional Follow-up Action Taken: left mess to call office back .......... Sherese Christopher  November 10, 2009 2:44 PM   pt informed, [facility] [address], called in..... Sherese Christopher  November 10, 2009 5:03 PM   New/Updated Medications: LORAZEPAM 0.5 MG TABS (LORAZEPAM) 1 by  mouth two times a day as needed anxiety Prescriptions: LORAZEPAM 0.5 MG TABS (LORAZEPAM) 1 by mouth two times a day as needed anxiety  #60 x 3   Entered by:   Charlsie Quest, CMA   Authorized by:   Cassandria Anger MD   Signed by:   Charlsie Quest, CMA on 11/10/2009   Method used:   Print then Give to Patient   RxID:   [id number]

## 2010-10-05 NOTE — Progress Notes (Signed)
Summary: pls call  Phone Note Call from Patient Call back at Home Phone [phone]   Caller: Patient Summary of Call: pt called requesting a call back Initial call taken by: Crissie Sickles, [place],  November 02, 2009 9:51 AM  Follow-up for Phone Call        I called: went to ER had BP up 222/97. She will see Dr Zenia Resides, her card  in [place], tomorrow to w/up secondary HTN as Dr Zenia Resides planned before - I reviewed his note.  Follow-up by: Cassandria Anger MD,  November 02, 2009 5:05 PM

## 2010-10-05 NOTE — Assessment & Plan Note (Signed)
Summary: FU [name] [name]   Vital Signs:  Patient profile:   77 year old female Weight:      150 pounds Temp:     98.3 degrees F oral Pulse rate:   61 / minute BP sitting:   142 / 80  (left arm)  Vitals Entered By: Doralee Albino (October 15, 2009 9:57 AM) CC: f/u Is Patient Diabetic? No   Primary Care Provider:  Dr. Tyrone Apple Plotnikov  CC:  f/u.  History of Present Illness: C/o feeling of flutters in the chest - unchanged. Her HCTZ and alendronate was stopped.  Preventive Screening-Counseling & Management  Alcohol-Tobacco     Smoking Status: never  Current Medications (verified): 1)  Levothyroxine Sodium 88 Mcg  Tabs (Levothyroxine Sodium) .Marland Kitchen.. 1 By Mouth Daily 2)  Cyanocobalamin 1000 Mcg/ml Inj Soln (Cyanocobalamin) .Marland Kitchen.. 1000 Mcg Q 2 Weeks 3)  Vitamin D3 1000 Unit  Tabs (Cholecalciferol) .Marland Kitchen.. 1 By Mouth Daily 4)  Lovastatin 40 Mg Tabs (Lovastatin) .... Once Daily 5)  Folic Acid 1 Mg Tabs (Folic Acid) .... Once Daily 6)  Aspirin 81 Mg Tbec (Aspirin) .Marland Kitchen.. 1 Once Daily 7)  Pepcid Ac Maximum Strength 20 Mg Tabs (Famotidine) .... One At Bedtime 8)  Prilosec Otc 20 Mg Tbec (Omeprazole Magnesium) .... Take  One 30-60 Min Before First Meal of The Day  Allergies: 1)  ! Sulfadiazine (Sulfadiazine) 2)  Norvasc 3)  Macrobid (Nitrofurantoin Monohyd Macro) 4)  Cefuroxime Axetil (Cefuroxime Axetil)  Past History:  Past Medical History: Last updated: 09/17/2009   DYSPNEA (ICD-786.05) with atypical upper airway symptoms ? all vcd/lpr/gerd     - See pulmonary eval September 17, 2009 > try off biphosphonates PALPITATIONS (ICD-785.1) BREAST PAIN, RIGHT (ICD-611.71) BREAST PAIN (ICD-611.71) VASOVAGAL SYNCOPE (ICD-780.2) PARONYCHIA (ICD-681.9) URINARY TRACT INFECTION (UTI) (ICD-599.0) URINARY URGENCY (ICD-788.63) OTHER SYMPTOMS INVOLVING ABDOMEN AND PELVIS (ICD-789.9) RASH AND OTHER NONSPECIFIC SKIN ERUPTION (ICD-782.1) VENOUS INSUFFICIENCY (ICD-459.81) EDEMA (ICD-782.3) HYPERLIPIDEMIA  (ICD-272.4) OSTEOARTHRITIS (ICD-715.90) MONOCLONAL GAMMOPATHY (ICD-273.1) ANEMIA-NOS (ICD-285.9) IBS (ICD-564.1) B12 DEFICIENCY (ICD-266.2) HYPOTHYROIDISM (ICD-244.9) HYPERTENSION (ICD-401.9) GERD    - EGD  1993  LES incompetence, es stricture (Medoff)  Social History: Last updated: 07/04/2007 Retired Married Never Smoked Alcohol use-no  Review of Systems  The patient denies anorexia, fever, chest pain, syncope, dyspnea on exertion, prolonged cough, abdominal pain, and severe indigestion/heartburn.    Physical Exam  General:  Well-developed,well-nourished,in no acute distress; alert,appropriate and cooperative throughout examination Nose:  External nasal examination shows no deformity or inflammation. Nasal mucosa are pink and moist without lesions or exudates. Mouth:  Oral mucosa and oropharynx without lesions or exudates.  Teeth in good repair. Lungs:  Normal respiratory effort, chest expands symmetrically. Lungs are clear to auscultation, no crackles or wheezes. Heart:  Normal rate and regular rhythm. S1 and S2 normal without gallop, murmur, click, rub or other extra sounds. Abdomen:  Bowel sounds positive,abdomen soft and non-tender without masses, organomegaly or hernias noted. Msk:  No deformity or scoliosis noted of thoracic or lumbar spine.   Neurologic:  No cranial nerve deficits noted. Station and gait are normal. Plantar reflexes are down-going bilaterally. DTRs are symmetrical throughout. Sensory, motor and coordinative functions appear intact.   Impression & Recommendations:  Problem # 1:  PALPITATIONS (ICD-785.1) Assessment Unchanged S/p Card and Pulm eval  Problem # 2:  GERD (ICD-530.81) w/mild dysphagia Assessment: Deteriorated Fossamax was stopped Her updated medication list for this problem includes:    Pepcid Ac Maximum Strength 20 Mg Tabs (Famotidine) ..... One at bedtime  Prilosec Otc 20 Mg Tbec (Omeprazole magnesium) .Marland Kitchen... Take  one 30-60 min  before first meal of the day  Orders: Gastroenterology Referral (GI) Dr Earlean Shawl  Problem # 3:  MONOCLONAL GAMMOPATHY (ICD-273.1) Assessment: Unchanged Hem f/u on a regular basis  Problem # 4:  EDEMA (ICD-782.3) Assessment: Improved  Complete Medication List: 1)  Levothyroxine Sodium 88 Mcg Tabs (Levothyroxine sodium) .Marland Kitchen.. 1 by mouth daily 2)  Cyanocobalamin 1000 Mcg/ml Inj Soln (Cyanocobalamin) .Marland Kitchen.. 1000 mcg q 2 weeks 3)  Vitamin D3 1000 Unit Tabs (Cholecalciferol) .Marland Kitchen.. 1 by mouth daily 4)  Lovastatin 40 Mg Tabs (Lovastatin) .... Once daily 5)  Folic Acid 1 Mg Tabs (Folic acid) .... Once daily 6)  Aspirin 81 Mg Tbec (Aspirin) .Marland Kitchen.. 1 once daily 7)  Pepcid Ac Maximum Strength 20 Mg Tabs (Famotidine) .... One at bedtime 8)  Prilosec Otc 20 Mg Tbec (Omeprazole magnesium) .... Take  one 30-60 min before first meal of the day  Patient Instructions: 1)  Use the Sinus rinse as needed 2)  Take Dexilant 1 a day instead of Prilosec 3)  Please schedule a follow-up appointment in 2 months.

## 2010-10-05 NOTE — Progress Notes (Signed)
Summary: Oral thrush  Phone Note Call from Patient Call back at [facility] Phone [phone]   Summary of Call: Patient called today c/o oral thrush and would like to know if MD will send in prescription for Mycelex Troches Lozenges. Initial call taken by: Ernestene Mention,  October 21, 2009 3:29 PM  Follow-up for Phone Call        ok Follow-up by: Cassandria Anger MD,  October 22, 2009 7:38 AM  Additional Follow-up for Phone Call Additional follow up Details #1::        Attempted to call pt, someone picked up then hung up. Attempted 2 other times, # busy Additional Follow-up by: Charlsie Quest, [place],  October 22, 2009 9:40 AM    Additional Follow-up for Phone Call Additional follow up Details #2::    Pt informed  Follow-up by: Charlsie Quest, CMA,  October 22, 2009 4:03 PM  New/Updated Medications: MYCELEX 10 MG TROC (CLOTRIMAZOLE) dissolve in mouth 5 times a day Prescriptions: MYCELEX 10 MG TROC (CLOTRIMAZOLE) dissolve in mouth 5 times a day  #35 x 1   Entered and Authorized by:   Cassandria Anger MD   Signed by:   Charlsie Quest, CMA on 10/22/2009   Method used:   [facility] [id number]       [address], [place]  [address]       Ph: [phone] or [phone]       Fax: [phone]   RxID:   [id number]

## 2010-10-05 NOTE — Assessment & Plan Note (Signed)
Summary: UTI...AS.   Vital Signs:  Patient profile:   77 year old female Height:      62 inches Weight:      149 pounds Temp:     98 degrees F BP sitting:   136 / 13  Vitals Entered By: Dawson Bills (Jan 09, 2010 11:06 AM) CC: [facility] dx with [facility], rx'd macrobid, on macrobid x 5/1 c/o of nausea   History of Present Illness: Pt here for trmt of UTI. She was seen at [facility] and put on Macrobid which she has AGAIN found out causes her nausea (this had happened 2/10). Her urine was cultured and is R to Cipro. It is sensitive to Augmentin and she has no sensitivities listed to that.  She is having significant nausea and some vomiting.  Problems Prior to Update: 1)  Osteopenia  (ICD-733.90) 2)  Gerd  (ICD-530.81) 3)  Dyspnea  (ICD-786.05) 4)  Palpitations  (ICD-785.1) 5)  Breast Pain, Right  (ICD-611.71) 6)  Breast Pain  (ICD-611.71) 7)  Vasovagal Syncope  (ICD-780.2) 8)  Paronychia  (ICD-681.9) 9)  Urinary Tract Infection (UTI)  (ICD-599.0) 10)  Urinary Urgency  (ICD-788.63) 11)  Other Symptoms Involving Abdomen and Pelvis  (ICD-789.9) 12)  Rash and Other Nonspecific Skin Eruption  (ICD-782.1) 13)  Venous Insufficiency  (ICD-459.81) 14)  Edema  (ICD-782.3) 15)  Hyperlipidemia  (ICD-272.4) 16)  Osteoarthritis  (ICD-715.90) 17)  Monoclonal Gammopathy  (ICD-273.1) 18)  Anemia-nos  (ICD-285.9) 19)  Ibs  (ICD-564.1) 20)  B12 Deficiency  (ICD-266.2) 21)  Hypothyroidism  (ICD-244.9) 22)  Hypertension  (ICD-401.9)  Medications Prior to Update: 1)  Levothyroxine Sodium 88 Mcg  Tabs (Levothyroxine Sodium) .Marland Kitchen.. 1 By Mouth Daily 2)  Cyanocobalamin 1000 Mcg/ml Inj Soln (Cyanocobalamin) .Marland Kitchen.. 1000 Mcg Q 2 Weeks 3)  Vitamin D3 1000 Unit  Tabs (Cholecalciferol) .Marland Kitchen.. 1 By Mouth Daily 4)  Lovastatin 40 Mg Tabs (Lovastatin) .... Once Daily 5)  Folic Acid 1 Mg Tabs (Folic Acid) .... Once Daily 6)  Aspirin 81 Mg Tbec (Aspirin) .Marland Kitchen.. 1 Once Daily 7)  Pepcid Ac Maximum Strength 20 Mg Tabs  (Famotidine) .... One At Bedtime 8)  Prilosec Otc 20 Mg Tbec (Omeprazole Magnesium) .... Take  One 30-60 Min Before First Meal of The Day 9)  Hydrochlorothiazide 25 Mg Tabs (Hydrochlorothiazide) .... Take One Tablet By Mouth Daily 10)  Lorazepam 0.5 Mg Tabs (Lorazepam) .Marland Kitchen.. 1 By Mouth Two Times A Day As Needed Anxiety  Current Medications (verified): 1)  Levothyroxine Sodium 88 Mcg  Tabs (Levothyroxine Sodium) .Marland Kitchen.. 1 By Mouth Daily 2)  Cyanocobalamin 1000 Mcg/ml Inj Soln (Cyanocobalamin) .Marland Kitchen.. 1000 Mcg Q 2 Weeks 3)  Vitamin D3 1000 Unit  Tabs (Cholecalciferol) .Marland Kitchen.. 1 By Mouth Daily 4)  Lovastatin 40 Mg Tabs (Lovastatin) .... Once Daily 5)  Folic Acid 1 Mg Tabs (Folic Acid) .... Once Daily 6)  Aspirin 81 Mg Tbec (Aspirin) .Marland Kitchen.. 1 Once Daily 7)  Pepcid Ac Maximum Strength 20 Mg Tabs (Famotidine) .... One At Bedtime 8)  Prilosec Otc 20 Mg Tbec (Omeprazole Magnesium) .... Take  One 30-60 Min Before First Meal of The Day 9)  Hydrochlorothiazide 25 Mg Tabs (Hydrochlorothiazide) .... Take One Tablet By Mouth Daily 10)  Lorazepam 0.5 Mg Tabs (Lorazepam) .Marland Kitchen.. 1 By Mouth Two Times A Day As Needed Anxiety  Allergies (verified): 1)  ! Sulfadiazine (Sulfadiazine) 2)  Norvasc 3)  Macrobid (Nitrofurantoin Monohyd Macro) 4)  Cefuroxime Axetil (Cefuroxime Axetil)  Physical Exam  General:  Well-developed,well-nourished,in no acute distress; alert,appropriate and cooperative throughout examination, nontoxic and apears younger than her stated age. Head:  Normocephalic and atraumatic without obvious abnormalities. No apparent alopecia or balding. Eyes:  Conjunctiva clear bilaterally.  Ears:  External ear exam shows no significant lesions or deformities.  Otoscopic examination reveals clear canals, tympanic membranes are intact bilaterally without bulging, retraction, inflammation or discharge. Hearing is grossly normal bilaterally. Nose:  External nasal examination shows no deformity or inflammation. Nasal  mucosa are pink and moist without lesions or exudates. Mouth:  Oral mucosa and oropharynx without lesions or exudates.  Teeth in good repair. Abdomen:  No suprapubic tenderness Msk:  No CVAT   Impression & Recommendations:  Problem # 1:  URINARY TRACT INFECTION (UTI) (ICD-599.0) Assessment New  Stop mMacrobid, start Augmentin. Take with food. Take Diflucan at end of AB.  Her updated medication list for this problem includes:    Amoxicillin-pot Clavulanate 250-125 Mg Tabs (Amoxicillin-pot clavulanate) ..... Onr tab by mouth three times a day  Encouraged to push clear liquids, get enough rest, and take acetaminophen as needed. To be seen in 10 days if no improvement, sooner if worse.  Problem # 2:  NAUSEA (ICD-787.02) Assessment: New  With Macrobid stopped, nausea should improve, Just in case, gave script to pt for Promethazine to get filled if needed. If continus to need it, come b Her updated medication list for this problem includes:    Promethazine Hcl 25 Mg Tabs (Promethazine hcl) ..... One tab by mouth every 6 hrs as needed for nausea.ack to be seen.  Discussed symptom control.   Complete Medication List: 1)  Levothyroxine Sodium 88 Mcg Tabs (Levothyroxine sodium) .Marland Kitchen.. 1 by mouth daily 2)  Cyanocobalamin 1000 Mcg/ml Inj Soln (Cyanocobalamin) .Marland Kitchen.. 1000 mcg q 2 weeks 3)  Vitamin D3 1000 Unit Tabs (Cholecalciferol) .Marland Kitchen.. 1 by mouth daily 4)  Lovastatin 40 Mg Tabs (Lovastatin) .... Once daily 5)  Folic Acid 1 Mg Tabs (Folic acid) .... Once daily 6)  Aspirin 81 Mg Tbec (Aspirin) .Marland Kitchen.. 1 once daily 7)  Pepcid Ac Maximum Strength 20 Mg Tabs (Famotidine) .... One at bedtime 8)  Prilosec Otc 20 Mg Tbec (Omeprazole magnesium) .... Take  one 30-60 min before first meal of the day 9)  Hydrochlorothiazide 25 Mg Tabs (Hydrochlorothiazide) .... Take one tablet by mouth daily 10)  Lorazepam 0.5 Mg Tabs (Lorazepam) .Marland Kitchen.. 1 by mouth two times a day as needed anxiety 11)  Amoxicillin-pot  Clavulanate 250-125 Mg Tabs (Amoxicillin-pot clavulanate) .... Onr tab by mouth three times a day 12)  Fluconazole 150 Mg Tabs (Fluconazole) .... One tab by mouth at end of augmentin for yeast infection. 13)  Promethazine Hcl 25 Mg Tabs (Promethazine hcl) .... One tab by mouth every 6 hrs as needed for nausea.   Patient Instructions: 1)  RTC if sxs don't resolve. Prescriptions: PROMETHAZINE HCL 25 MG TABS (PROMETHAZINE HCL) one tab by mouth every 6 hrs as needed for nausea.  #10 x 0   Entered and Authorized by:   Raenette Rover MD   Signed by:   Raenette Rover MD on 01/09/2010   Method used:   Print then Give to Patient   RxID:   [id number] FLUCONAZOLE 150 MG TABS (FLUCONAZOLE) one tab by mouth at end of augmentin for yeast infection.  #1 x 0   Entered and Authorized by:   Raenette Rover MD   Signed by:   Raenette Rover MD on 01/09/2010  Method used:   Electronically to        [id number] (retail)       [address], [place]  [address]       Ph: [phone] or [phone]       Fax: [phone]   RxID:   [id number] AMOXICILLIN-POT CLAVULANATE 250-125 MG TABS (AMOXICILLIN-POT CLAVULANATE) onr tab by mouth three times a day  #30 x 0   Entered and Authorized by:   Raenette Rover MD   Signed by:   Raenette Rover MD on 01/09/2010   Method used:   Electronically to        [id number] (ret[facility]       [address], [place]  [address]       Ph: [phone] or [phone]       Fax: [phone]   RxID:   [id number]

## 2010-10-05 NOTE — Letter (Signed)
Summary: [facility]   Imported By: Phillis Knack 12/30/2009 08:10:43  _____________________________________________________________________  External Attachment:    Type:   Image     Comment:   External Document

## 2010-10-05 NOTE — Assessment & Plan Note (Signed)
Summary: Pulmonary/ new pt eval    Copy to:  Dr. Lew Dawes Primary Provider/Referring Provider:  Dr. Lew Dawes  CC:  Dyspnea.  History of Present Illness: 16 yowf never smoker with pattern of upper airway congestion and tendency to cyclial cough previously eval in 2000 in pulmonary clinic.  September 17, 2009 cc fluttering and congestion in throat x 6 months while on alendronate. No apparent correlation by diary to pvc's per cards w/u.  worse after lies down but not consistent, nor occuring with activity with variable ex tolerance reported.   Pt denies any significant sore throat, dysphagia, itching, sneezing,  nasal congestion or excess secretions,  fever, chills, sweats, unintended wt loss, pleuritic or exertional cp, hempoptysis, change in activity tolerance  orthopnea pnd or leg swelling Pt also denies any obvious fluctuation in symptoms with weather or environmental change or other alleviating or aggravating factors.       Current Medications (verified): 1)  Levothyroxine Sodium 88 Mcg  Tabs (Levothyroxine Sodium) .Marland Kitchen.. 1 By Mouth Daily 2)  Cyanocobalamin 1000 Mcg/ml Inj Soln (Cyanocobalamin) .Marland Kitchen.. 1000 Mcg Q 2 Weeks 3)  Vitamin D3 1000 Unit  Tabs (Cholecalciferol) .Marland Kitchen.. 1 By Mouth Daily 4)  Lovastatin 40 Mg Tabs (Lovastatin) .... Once Daily 5)  Folic Acid 1 Mg Tabs (Folic Acid) .... Once Daily 6)  Alendronate Sodium 70 Mg Tabs (Alendronate Sodium) .... Once Wkly 7)  Aspirin 81 Mg Tbec (Aspirin) .Marland Kitchen.. 1 Once Daily  Allergies (verified): 1)  ! Sulfadiazine (Sulfadiazine) 2)  Norvasc 3)  Macrobid (Nitrofurantoin Monohyd Macro) 4)  Cefuroxime Axetil (Cefuroxime Axetil)  Past History:  Past Medical History:   DYSPNEA (ICD-786.05) with atypical upper airway symptoms ? all vcd/lpr/gerd     - See pulmonary eval September 17, 2009 > try off biphosphonates PALPITATIONS (ICD-785.1) BREAST PAIN, RIGHT (ICD-611.71) BREAST PAIN (ICD-611.71) VASOVAGAL SYNCOPE  (ICD-780.2) PARONYCHIA (ICD-681.9) URINARY TRACT INFECTION (UTI) (ICD-599.0) URINARY URGENCY (ICD-788.63) OTHER SYMPTOMS INVOLVING ABDOMEN AND PELVIS (ICD-789.9) RASH AND OTHER NONSPECIFIC SKIN ERUPTION (ICD-782.1) VENOUS INSUFFICIENCY (ICD-459.81) EDEMA (ICD-782.3) HYPERLIPIDEMIA (ICD-272.4) OSTEOARTHRITIS (ICD-715.90) MONOCLONAL GAMMOPATHY (ICD-273.1) ANEMIA-NOS (ICD-285.9) IBS (ICD-564.1) B12 DEFICIENCY (ICD-266.2) HYPOTHYROIDISM (ICD-244.9) HYPERTENSION (ICD-401.9) GERD    - EGD  1993  LES incompetence, 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  The patient denies shortness of breath at rest, productive cough, non-productive cough, coughing up blood, chest pain, irregular heartbeats, acid heartburn, indigestion, loss of appetite, weight change, abdominal pain, difficulty swallowing, sore throat, tooth/dental problems, headaches, nasal congestion/difficulty breathing through nose, sneezing, itching, ear ache, anxiety, depression, hand/feet swelling, joint stiffness or pain, rash, change in color of mucus, and fever.    Vital Signs:  Patient profile:   77 year old female Weight:      152.38 pounds BMI:     27.97 O2 Sat:      97 % on Room air Temp:     97.9 degrees F oral Pulse rate:   72 / minute BP sitting:   124 / 78  (left arm)  Vitals Entered By: Tilden Dome (September 17, 2009 1:51 PM)  O2 Flow:  Room air  Serial Vital Signs/Assessments:  Comments: 2:25 PM Ambulatory Pulse Oximetry  Resting; HR__76___    02 Sat__95%ra___  Lap1 (185 feet)   HR__93___   02 Sat__94%ra___ Lap2 (185 feet)   HR__99___   02 Sat__95%ra___  Lap3 (185 feet)   HR__94___   02 Sat__97%ra___  _x__Test Completed without Difficulty ___Test Stopped due to:   By: Tilden Dome    Physical Exam  Additional Exam:  wt 151 > 152  September 17, 2009 anxious very well preserved amb wf nad HEENT: nl dentition, turbinates, and orophanx. Nl external ear canals without cough reflex NECK :  without JVD/Nodes/TM/ nl carotid upstrokes bilaterally LUNGS: no acc muscle use, clear to A and P bilaterally without cough on insp or exp maneuvers CV:  RRR  no s3 or murmur or increase in P2, no edema  ABD:  soft and nontender with nl excursion in the supine position. No bruits or organomegaly, bowel sounds nl MS:  warm without deformities, calf tenderness, cyanosis or clubbing SKIN: warm and dry without lesions   NEURO:  alert, approp, no deficits     Impression & Recommendations:  Problem # 1:  DYSPNEA (ICD-786.05)   DDX of  difficult airways managment all start with A and  include Adherence, Ace Inhibitors, Acid Reflux, Active Sinus Disease, Alpha 1 Antitripsin deficiency, Anxiety masquerading as Airways dz,  ABPA,  allergy(esp in young), Aspiration (esp in elderly), Adverse effects of DPI,  Active smokers, plus one B  = Beta blocker use..   Believe her symptoms are not reproducible with activity or flaring at night while sleeping and are a combination of anxiety and Acid Reflux perhaps brought on by use of biphosphonates.  only way to tell is try off and rx with diet and ppi x 1 month  Discussed in detail all the  indications, usual  risks and alternatives  relative to the benefits with patient who agrees to proceed with empiric trial.  discussed following with pt that the  ramp to expected improvement (and for that matter, worsening, if a chronic effective medication is stopped)  can be measured in weeks, not days, a common misconception because this is not Heartburn with no immediate cause and effect relationship so that response to therapy or lack thereof can be very difficult to assess.    Pulse Oximetry, Ambulatory [id number])  Problem # 2:  GERD (ICD-530.81) See EGD as per pmhx.  this pt also has incompetent LES so probably not a  good candidate for oral biphosphonates  Her updated medication list for this problem includes:    Pepcid Ac Maximum Strength 20 Mg Tabs (Famotidine) ..... One at bedtime    Prilosec Otc 20 Mg Tbec (Omeprazole magnesium) .Marland Kitchen... Take  one 30-60 min before first meal of the day  Problem # 3:  OSTEOPENIA (ICD-733.90) consider for prolia or IV reclast as alternative to biphonates given above concerns  Medications Added to Medication List This Visit: 1)  Alendronate Sodium 70 Mg Tabs (Alendronate sodium) .... Once wkly 2)  Aspirin 81 Mg Tbec (Aspirin) .Marland Kitchen.. 1 once daily 3)  Pepcid Ac Maximum Strength 20 Mg Tabs (Famotidine) .... One at bedtime 4)  Prilosec Otc 20 Mg Tbec (Omeprazole magnesium) .... Take  one 30-60 min before first meal of the day  Other Orders: New Patient Level V [id number])  Patient Instructions: 1)  stop alendronate 2)  Acid reflux is the leading suspect here and needs to be eliminated  completely before considering additional studies or treatment options. To suppress this maximally, take Prilosec 20mg  30 min before first  meal and pepcid 20 mg (otc) at bedtime plus diet measures as listed.  3)  GERD (REFLUX)  is a common cause of respiratory symptoms. It commonly  presents without heartburn and can be treated with medication, but also with lifestyle changes including avoidance of late meals, excessive alcohol, smoking cessation, and avoid fatty foods, chocolate, peppermint, colas, red wine, and acidic juices such as orange juice. NO MINT OR MENTHOL PRODUCTS SO NO COUGH DROPS  4)  USE SUGARLESS CANDY INSTEAD (jolley ranchers)  5)  NO OIL BASED VITAMINS  6)  Please schedule a follow-up appointment in 4 weeks, sooner if needed

## 2010-10-07 NOTE — Assessment & Plan Note (Signed)
Summary: FU [name] [name] #   Vital Signs:  Patient profile:   77 year old female Height:      62 inches Weight:      146 pounds BMI:     26.80 Temp:     98.8 degrees F oral Pulse rate:   72 / minute Pulse rhythm:   regular Resp:     16 per minute BP sitting:   170 / 80  (left arm) Cuff size:   regular  Vitals Entered By: Jonathon Resides, CMA(AAMA) (September 22, 2010 10:19 AM) CC: f/u  Is Patient Diabetic? No   Primary Care Provider:  Dr. Tyrone Apple Plotnikov  CC:  f/u .  History of Present Illness: The patient presents for a follow up of back pain, anxiety - still having it (Lorazepam helps her the best), depression and headaches.   Current Medications (verified): 1)  Levothyroxine Sodium 88 Mcg  Tabs (Levothyroxine Sodium) .Marland Kitchen.. 1 By Mouth Daily 2)  Cyanocobalamin 1000 Mcg/ml Inj Soln (Cyanocobalamin) .Marland Kitchen.. 1000 Mcg Q 2 Weeks 3)  Vitamin D3 1000 Unit  Tabs (Cholecalciferol) .Marland Kitchen.. 1 By Mouth Daily 4)  Lovastatin 40 Mg Tabs (Lovastatin) .... Once Daily 5)  Folic Acid 1 Mg Tabs (Folic Acid) .... Once Daily 6)  Aspirin 81 Mg Tbec (Aspirin) .Marland Kitchen.. 1 Once Daily 7)  Hydrochlorothiazide 25 Mg Tabs (Hydrochlorothiazide) .... Take One Tablet By Mouth Daily 8)  Lorazepam 0.5 Mg Tabs (Lorazepam) .Marland Kitchen.. 1 By Mouth Two Times A Day As Needed Anxiety 9)  Promethazine Hcl 25 Mg Tabs (Promethazine Hcl) .... One Tab By Mouth Every 6 Hrs As Needed For Nausea. 10)  Citalopram Hydrobromide 20 Mg Tabs (Citalopram Hydrobromide) .Marland Kitchen.. 1 By Mouth Once Daily For Depression 11)  Prilosec 40 Mg Cpdr (Omeprazole) .Marland Kitchen.. 1 By Mouth Once Daily  Allergies (verified): 1)  ! Sulfadiazine (Sulfadiazine) 2)  Norvasc 3)  Macrobid (Nitrofurantoin Monohyd Macro) 4)  Cefuroxime Axetil (Cefuroxime Axetil)  Past History:  Past Medical History: Last updated: 08/02/2010   DYSPNEA (ICD-786.05) with atypical upper airway symptoms ? all vcd/lpr/gerd     - See pulmonary eval September 17, 2009 > try off biphosphonates PALPITATIONS  (ICD-785.1) BREAST PAIN, RIGHT (ICD-611.71) BREAST PAIN (ICD-611.71) VASOVAGAL SYNCOPE (ICD-780.2) PARONYCHIA (ICD-681.9) URINARY TRACT INFECTION (UTI) (ICD-599.0) URINARY URGENCY (ICD-788.63) OTHER SYMPTOMS INVOLVING ABDOMEN AND PELVIS (ICD-789.9) RASH AND OTHER NONSPECIFIC SKIN ERUPTION (ICD-782.1) VENOUS INSUFFICIENCY (ICD-459.81) EDEMA (ICD-782.3) HYPERLIPIDEMIA (ICD-272.4) OSTEOARTHRITIS (ICD-715.90) MONOCLONAL GAMMOPATHY (ICD-273.1) ANEMIA-NOS (ICD-285.9) IBS (ICD-564.1) B12 DEFICIENCY (ICD-266.2) HYPOTHYROIDISM (ICD-244.9) HYPERTENSION (ICD-401.9) GERD    - EGD  1993  LES incompetence, 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         more anxios  Physical Exam  General:  Well-developed,well-nourished,in no acute distress; alert,appropriate and cooperative throughout examination, nontoxic and apears younger than her stated age. Eyes:  Conjunctiva clear bilaterally.  Nose:  External nasal examination shows no deformity or inflammation. Nasal mucosa are pink and moist without lesions or exudates. Mouth:  Oral mucosa and oropharynx with torus palatinus which is tender on R where the area of inflamation is noted 7 mm.  Teeth in good repair. Lungs:  Normal respiratory effort, chest expands symmetrically. Lungs are clear to auscultation, no crackles or wheezes. Heart:  Normal rate and regular rhythm. S1 and S2 normal without gallop, murmur, click, rub or  other extra sounds. Abdomen:  No suprapubic tenderness Msk:  WNL Extremities:  trace left pedal edema and trace right pedal edema.   Neurologic:  No cranial nerve deficits noted. Station and gait are normal. Plantar reflexes are down-going bilaterally. DTRs are symmetrical throughout.  Sensory, motor and coordinative functions appear intact. Skin:  Intact without suspicious lesions or rashes Psych:  Oriented X3, no depressed affect, not  tearful, and slightly anxious.  not suicidal.     Impression & Recommendations:  Problem # 1:  ELEVATED BLOOD PRESSURE (ICD-796.2) Assessment New  Her updated medication list for this problem includes:    Hydrochlorothiazide 25 Mg Tabs (Hydrochlorothiazide) .Marland Kitchen... Take one tablet by mouth daily (she was taking 1/2 tab - I asked her to take 1 a day) If not better - check for carcinoid: so far - no clear indication...  Orders: TLB-B12, Serum-Total ONLY [id number]) TLB-BMP (Basic Metabolic Panel-BMET) (99991111) TLB-CBC Platelet - w/Differential (85025-CBCD) TLB-Hepatic/Liver Function Pnl (80076-HEPATIC) TLB-TSH (Thyroid Stimulating Hormone) (84443-TSH) TLB-Sedimentation Rate (ESR) (85652-ESR) TLB-Udip ONLY (81003-UDIP)  Problem # 2:  DEPRESSION/ANXIETY (ICD-300.4) Assessment: Unchanged Increase Lorazepam prn  Problem # 3:  OSTEOPENIA (ICD-733.90) Assessment: Unchanged Vit D  Problem # 4:  B12 DEFICIENCY (ICD-266.2) Assessment: Unchanged  On the regimen of medicine(s) reflected in the chart    Orders: TLB-B12, Serum-Total ONLY [id number]) TLB-BMP (Basic Metabolic Panel-BMET) (99991111) TLB-CBC Platelet - w/Differential (85025-CBCD) TLB-Hepatic/Liver Function Pnl (80076-HEPATIC) TLB-TSH (Thyroid Stimulating Hormone) (84443-TSH) TLB-Sedimentation Rate (ESR) (85652-ESR) TLB-Udip ONLY (81003-UDIP)  Problem # 5:  ANXIETY (ICD-300.00) Assessment: Deteriorated  Her updated medication list for this problem includes:    Lorazepam 0.5 Mg Tabs (Lorazepam) .Marland Kitchen... 1 by mouth three times a day as needed anxiety    Citalopram Hydrobromide 20 Mg Tabs (Citalopram hydrobromide) .Marland Kitchen... 1 by mouth bid for depression  Orders: TLB-B12, Serum-Total ONLY [id number]) TLB-BMP (Basic Metabolic Panel-BMET) (99991111) TLB-CBC  Platelet - w/Differential (85025-CBCD) TLB-Hepatic/Liver Function Pnl (80076-HEPATIC) TLB-TSH (Thyroid Stimulating Hormone) (84443-TSH) TLB-Sedimentation Rate (ESR) (85652-ESR) TLB-Udip ONLY (81003-UDIP)  Complete Medication List: 1)  Levothyroxine Sodium 88 Mcg Tabs (Levothyroxine sodium) .Marland Kitchen.. 1 by mouth daily 2)  Cyanocobalamin 1000 Mcg/ml Inj Soln (Cyanocobalamin) .Marland Kitchen.. 1000 mcg q 2 weeks 3)  Vitamin D3 1000 Unit Tabs (Cholecalciferol) .Marland Kitchen.. 1 by mouth daily 4)  Lovastatin 40 Mg Tabs (Lovastatin) .... Once daily 5)  Folic Acid 1 Mg Tabs (Folic acid) .... Once daily 6)  Aspirin 81 Mg Tbec (Aspirin) .Marland Kitchen.. 1 once daily 7)  Hydrochlorothiazide 25 Mg Tabs (Hydrochlorothiazide) .... Take one tablet by mouth daily 8)  Lorazepam 0.5 Mg Tabs (Lorazepam) .Marland Kitchen.. 1 by mouth three times a day as needed anxiety 9)  Promethazine Hcl 25 Mg Tabs (Promethazine hcl) .... One tab by mouth every 6 hrs as needed for nausea. 10)  Citalopram Hydrobromide 20 Mg Tabs (Citalopram hydrobromide) .Marland Kitchen.. 1 by mouth bid for depression 11)  Prilosec 40 Mg Cpdr (Omeprazole) .Marland Kitchen.. 1 by mouth once daily  Other Orders: TD Toxoids IM 7 YR + [id number]) Admin 1st Vaccine [id number])  Patient Instructions: 1)  Please schedule a follow-up appointment in 3 months. Prescriptions: PRILOSEC 40 MG CPDR (OMEPRAZOLE) 1 by mouth once daily  #90 x 3   Entered and Authorized by:   Cassandria Anger MD   Signed by:   Cassandria Anger MD on 09/22/2010   Method used:   Print then Give to Patient   RxID:   [id number] CITALOPRAM HYDROBROMIDE 20 MG TABS (CITALOPRAM HYDROBROMIDE) 1 by mouth bid for depression  #180  x 3   Entered and Authorized by:   Cassandria Anger MD   Signed by:   Cassandria Anger MD on 09/22/2010   Method used:   Print then Give to Patient   RxID:   [id number] HYDROCHLOROTHIAZIDE 25 MG TABS (HYDROCHLOROTHIAZIDE) Take one tablet by mouth daily  #90 x 3   Entered and Authorized by:   Cassandria Anger MD    Signed by:   Cassandria Anger MD on 09/22/2010   Method used:   Print then Give to Patient   RxID:   [id number] LOVASTATIN 40 MG TABS (LOVASTATIN) once daily  #90 Tablet x 3   Entered and Authorized by:   Cassandria Anger MD   Signed by:   Cassandria Anger MD on 09/22/2010   Method used:   Print then Give to Patient   RxID:   [id number] CYANOCOBALAMIN 1000 MCG/ML INJ SOLN (CYANOCOBALAMIN) 1000 mcg q 2 weeks  #30 [place] x 2   Entered and Authorized by:   Cassandria Anger MD   Signed by:   Cassandria Anger MD on 09/22/2010   Method used:   Print then Give to Patient   RxID:   [id number] LEVOTHYROXINE SODIUM 88 MCG  TABS (LEVOTHYROXINE SODIUM) 1 by mouth daily  #90 Tablet x 3   Entered and Authorized by:   Cassandria Anger MD   Signed by:   Cassandria Anger MD on 09/22/2010   Method used:   Print then Give to Patient   RxID:   [id number] LORAZEPAM 0.5 MG TABS (LORAZEPAM) 1 by mouth three times a day as needed anxiety  #90 x 60   Entered and Authorized by:   Cassandria Anger MD   Signed by:   Cassandria Anger MD on 09/22/2010   Method used:   Print then Give to Patient   RxID:   [id number] CITALOPRAM HYDROBROMIDE 20 MG TABS (CITALOPRAM HYDROBROMIDE) 1 by mouth bid for depression  #60 x 11   Entered and Authorized by:   Cassandria Anger MD   Signed by:   Cassandria Anger MD on 09/22/2010   Method used:   Print then Give to Patient   RxID:   [id number]    Orders Added: 1)  Est. Patient Level IV [id number] 2)  TD Toxoids IM 7 YR + [id number] 3)  Admin 1st Vaccine [id number])  TLB-B12, Serum-Total ONLY [82607-B12] 5)  TLB-BMP (Basic Metabolic Panel-BMET) 123456 6)  TLB-CBC Platelet - w/Differential [85025-CBCD] 7)  TLB-Hepatic/Liver Function Pnl [80076-HEPATIC] 8)  TLB-TSH (Thyroid Stimulating Hormone) [84443-TSH] 9)  TLB-Sedimentation Rate (ESR) [85652-ESR] 10)  TLB-Udip ONLY [81003-UDIP]   Immunizations  Administered:  Tetanus Vaccine:    Vaccine Type: Td    Site: right deltoid    Mfr: [place]    Dose: 0.5 ml    Route: IM    Given by: Jonathon Resides, CMA(AAMA)    Exp. Date: 12/23/2011    Lot #: [id number]    VIS given: 07/23/08 version given September 22, 2010.   Immunizations Administered:  Tetanus Vaccine:    Vaccine Type: Td    Site: right deltoid    Mfr: [place]    Dose: 0.5 ml    Route: IM    Given by: Jonathon Resides, CMA(AAMA)    Exp. Date: 12/23/2011    Lot #: [id number]    VIS given: 07/23/08 version given September 22, 2010.

## 2010-10-13 ENCOUNTER — Encounter: Payer: Self-pay | Admitting: Internal Medicine

## 2010-10-17 ENCOUNTER — Encounter: Payer: Self-pay | Admitting: Internal Medicine

## 2010-10-27 NOTE — Letter (Signed)
Summary: Derwood Kaplan MD/[facility] [place] MD/[facility]   Imported By: Bubba Hales 10/22/2010 07:57:52  _____________________________________________________________________  External Attachment:    Type:   Image     Comment:   External Document

## 2010-11-02 ENCOUNTER — Encounter: Payer: Self-pay | Admitting: Cardiovascular Disease

## 2010-12-27 ENCOUNTER — Ambulatory Visit: Payer: Self-pay | Admitting: Internal Medicine

## 2011-01-17 ENCOUNTER — Encounter: Payer: Self-pay | Admitting: Internal Medicine

## 2011-01-18 ENCOUNTER — Ambulatory Visit: Payer: Self-pay | Admitting: Internal Medicine

## 2011-01-18 NOTE — Assessment & Plan Note (Signed)
[facility] OFFICE NOTE   NAME:[name], [name]                       MRN:          [id number]  DATE:10/20/2009                            DOB:          Aug 05, 1933    PROBLEM LIST:  1. Palpitations.  2. Hypertension.  3. Hypothyroidism.  4. Hyperlipidemia.   INTERVAL HISTORY:  The patient states that several days ago the patient  checked her blood pressure at home as she was feeling some weakness.  It  was around [id number] systolic, and she reported to emergency room.  In the  emergency room, she states she was there for approximately 3 hours,  until her blood pressure was well-controlled and she was discharged  home.  Yesterday on the phone, I increased her hydrochlorothiazide to 25  mg daily which she has been taking.  The patient continues to endorse  palpitations throughout the day, although the Holter monitor was only  significant for sinus bradycardia with occasional PVCs.  She states that  she continues to have these palpitations and occasionally a fullness in  her throat.  Of note, she also had a negative exercise echocardiogram,  for inducible ischemia, although she does have frequent PVCs early into  recovery and a hypertensive blood pressure response.  The patient states  these symptoms have been present since October.  She denies any increase  in psychosocial stressors during this period.   PHYSICAL EXAMINATION:  VITAL SIGNS:  Today, her blood pressure is  135/81, her pulse is 82.  She is satting 98% on room air.  She weighs  146 pounds which is 4 pounds less than she weighed in November of last  year.  GENERAL:  She is in no acute distress.  HEENT:  Normocephalic and atraumatic.  NECK:  Supple.  HEART:  Regular rate and rhythm.  ABDOMEN:  Soft.  EXTREMITIES:  Without edema.   Review of the patient's emergency room report unfortunately does not  have blood pressure on it.  EKG taken today in  clinic shows normal sinus  rhythm with one PVC.   ASSESSMENT/PLAN:  1. Hypertension.  The patient appears to have had one episode of very      high blood pressure.  If this recurs, we will undertake workup for      secondary causes of hypertension.  For now, in addition to the      hydrochlorothiazide 12.5 mg daily.  We will give her samples for      Bystolic 5 mg daily which may be helpful in controlling the      palpitations.  If after using the Bystolic for 2 weeks and she      notices no improvement in the symptoms, we will stop this      medication and increase the hydrochlorothiazide to 25 mg daily.  2. Palpitations.  We will see if  Bystolic will be helpful to quiet      down her premature ventricular contractions.  Of note, the atenolol      in the past has not been and she did have some sinus bradycardia  with the atenolol, so we will be careful in the titration up of any      other beta-      blocker.  3. Hypothyroidism.  TSH in November 2010 was 1.6.  It has been      adjusted by her primary care physician.     Arlee Muslim, MD  Electronically Signed    SGA/MedQ  DD: 10/20/2009  DT: 10/21/2009  Job #: [id number]

## 2011-01-18 NOTE — Letter (Signed)
November 03, 2009    Dr. Tyrone Apple Plotnikov  [address]  [place], [place]  [address]   RE:  BAYLEI, SPEAKES  MRN:  [id number]  /  DOB:  [birth year]   Dear Dr. Alain Marion:   I am writing to update you on the treatment plan for Ms. Arboleda.  As you  know she is a  77 year old female who has been complaining of  palpitations and generalized weakness.  Recently she has had rather  large fluctuations in her blood pressure with systolic blood pressure  ranging from 115 to over 200.  I am proceeding with a workup for  secondary causes of hypertension including renal artery duplex, renin  and aldosterone levels as well as 24-hour urine for catecholamines and  metanephrines.  She is currently on hydrochlorothiazide 25 mg daily and  her blood pressures over the past couple days have all been under  excellent control.  The patient also has complaints of generalized  weakness and  I do not think that the fluctuations in her blood pressure  are responsible for this symptom.  She did wear a Holter monitor which  showed occasional PVCs and an average heart rate of 54 beats per minute  while she was on a beta blocker.  We stopped her atenolol at that time  and her pulse has come up appropriately.  I will keep you updated as to  her progress regarding the hypertension.   Please feel free to contact my office at any time with any questions or  concerns.    Sincerely,     Arlee Muslim, MD  Electronically Signed   SGA/MedQ  DD: 11/03/2009  DT: 11/03/2009  Job #: [id number]

## 2011-01-18 NOTE — Assessment & Plan Note (Signed)
[facility] OFFICE NOTE   NAME:[name] [name], [name] [name]                       MRN:          [id number]  DATE:07/14/2009                            DOB:          [birth year]    CHIEF COMPLAINT:  Weakness in the chest and palpitations.   HISTORY OF PRESENT ILLNESS:  [name] [name] is a 77 year old white female  with past medical history significant for hypothyroidism, hypertension,  hyperlipidemia, who is presenting with palpitations, a weak feeling in  the chest, and increased dyspnea on exertion.  The patient states that  for the past several weeks, she has been experiencing palpitations  lasting 10-15 minutes several times a week.  Her primary care physician  recently changed her beta-blocker from Coreg to atenolol 25 mg b.i.d.  After this change in medications, her palpitations subsided.  She states  that she has a weak feeling in her chest that has probably been present  for the past month or so.  It seems like it is always present and does  not radiate.  There are also no associated symptoms.  The patient does  state that for the past month or two, she also has not been able to  accomplish the tasks that she could do fairly easily.  She fatigues  quickly.  She denies any syncopal episodes, dizziness, or lower  extremity edema.   PAST MEDICAL HISTORY:  As above in HPI.   SOCIAL HISTORY:  No tobacco, no alcohol.   FAMILY HISTORY:  Negative for premature coronary artery disease.   ALLERGIES:  SULFA.   MEDICATIONS:  1. Aspirin 81 mg daily.  2. Atenolol 25 mg b.i.d.  3. Levothyroxine 88 mcg daily.  4. Lovastatin 40 mg daily.  5. Alendronate 70 mg every week.  6. Vitamin D 1000 units daily.  7. Vitamin E 400 units daily.  8. Folic acid.  9. Vitamin B12.   REVIEW OF SYSTEMS:  As above in HPI.  In addition, the patient is  complaining of left knee pain for which she gets occasional injections.  Other systems  reviewed and are negative.   PHYSICAL EXAMINATION:  VITAL SIGNS:  The patient has a blood pressure of  155/63, pulse is 48, she weighs 150 pounds.  GENERAL:  No acute distress.  HEENT:  Nonfocal.  Normocephalic, atraumatic.  NECK:  Supple.  There is no JVD.  There are no carotid bruits.  HEART:  Regular rate and rhythm without murmur.  LUNGS:  Clear bilaterally.  ABDOMEN:  Soft, nontender, nondistended.  EXTREMITIES:  Without edema.  SKIN:  Warm and dry.  Pulses are 2+ bilateral, carotid, radial and  posterior tibial pulses.  NEURO:  Nonfocal.  MUSCULOSKELETAL:  5/5 in bilateral upper and lower extremity strength.   LABORATORY DATA:  Review of the patient's lab work from November 2 shows  sodium of 148, potassium 4.5, chloride 102, CO2 29, BUN 23, creatinine  1.3, glucose 91, calcium 9.0, white count 5.7, hemoglobin 12, hematocrit  33, platelet count 199.  EKG from today independently reviewed by myself  demonstrates sinus bradycardia with a ventricular  rate of 50 beats per  minute.   ASSESSMENT:  A 77 year old white female with hypertension and  hyperlipidemia presented with palpitations increase, fatigue, and ill-  defined chest weakness.  The palpitations the patient is experiencing  have almost completely resolved with atenolol.  These palpitations may  have represented PACs, PVCs or even possibly atrial fibrillation.  The  patient's increased fatigue and the sensation she has been experiencing  in her chest may be secondary to bradycardia but angina should be  considered.   PLAN:  Today, we will order a 48-hour Holter monitor in order to  evaluate the patient for atrial fibrillation.  We will also order a  dobutamine stress echocardiogram in order to rule out an inducible  ischemia.  We asked that she decrease her atenolol dose from 25 mg  b.i.d. to 25 mg daily as she is bradycardic today and this may be the  etiology for the increased weakness.  We will see the patient back  in  clinic after the results of these studies are obtained.  Other than the  decrease in the frequency of atenolol, she will continue her other  medications as listed above.      Arlee Muslim, MD  Electronically Signed    SGA/MedQ  DD: 07/14/2009  DT: 07/15/2009  Job #: [id number]

## 2011-01-18 NOTE — Assessment & Plan Note (Signed)
[facility] OFFICE NOTE   NAME:[name] [name], [name] [name]                       MRN:          [id number]  DATE:08/03/2009                            DOB:          1933/03/23    PROBLEM LIST:  1. Palpitations.  2. Hypertension.  3. Hypothyroidism.  4. Hyperlipidemia.   INTERVAL HISTORY:  At her last office visit, we decreased the atenolol  to 25 mg daily.  The patient states she noted no change in her symptoms.  After the results for Holter monitor indicated, she was having some  sinus bradycardia into the mid 40s during mid day.  We had the patient  stop the atenolol.  She states she again noticed no change in her  symptomatology after discontinuing the atenolol.  She continues to get  occasional palpitations that make her feel slightly weak in the chest.  There is no associated diaphoresis or nausea.  She has been compliant  with other medications and denies any other complaints.  The patient  states she checks her blood pressures frequently at home and yesterday  her systolic blood pressure was 170, although usually it is quite lower  than that.  She says sometimes it can be around 120, but she is not able  to really recall other readings.  Of note, the patient states that she  may need surgery on her knee and that her orthopedic surgeon is  requesting risk stratification for this surgery.   PHYSICAL EXAMINATION:  VITAL SIGNS:  The patient's blood pressure is  152/73, pulse is 73, sating 97% on room air, and she weighs 150 pounds  which she weighed at her last visit.  GENERAL:  She is in no acute distress.  HEENT:  Nonfocal.  NECK:  Supple.  There are no carotid bruits.  There is no JVD.  HEART:  Regular rate and rhythm with very rare ectopic beats.  LUNGS:  Clear bilaterally.  ABDOMEN:  Soft, nontender, nondistended.  EXTREMITIES:  Without edema.   Review of the patient's 48-hour Holter monitor indicates a  average heart  rate of 54, minimum heart rate of 44 which occurred during the day, and  a maximum heart rate of 86.  There were occasional premature ventricular  contractions.   Review of the patient's stress echocardiogram:  A dobutamine stress echo  was ordered.  However, the performing physician changed the test to a  exercise echocardiogram as she had taken her beta-blocker dose at the  morning.  The stress echo was negative for inducible ischemia.  Her left  ventricular systolic function was within normal limits.  She only  exercised for approximately 2 minutes.  However, she states that she  could have gone longer if they did not ask her to stop.   ASSESSMENT AND PLAN:  The patient's palpitations are likely secondary to  occasional premature ventricular contractions.  They are benign in  nature.  It is reasonable for her to stay off the atenolol as she states  now that she is having the same frequency of palpitations off the  atenolol as she does  on the atenolol.  In addition, her heart rate was  in the mid 40s during the day while she was taking this medication.  Her  stress test was encouraging as there was no wall motion abnormalities at  a good heart rate and there were no ST-segment changes suggestive of  ischemia.  I would consider her to be at low-risk for any type of knee  surgery.  I have started the patient on hydrochlorothiazide 12.5 mg  daily for her blood pressure and she will follow up with Dr. Alain Marion  for monitoring of this.  I will see the patient back in 2 months' time  to evaluate the chest symptomatology she is having.  She is instructed  to contact our office in the interim if any problems were to arise.     Arlee Muslim, MD  Electronically Signed    SGA/MedQ  DD: 08/03/2009  DT: 08/04/2009  Job #: [id number]   cc:   [name], MD

## 2011-01-18 NOTE — Assessment & Plan Note (Signed)
[facility] OFFICE NOTE   NAME:[name], [name] BLAUER                       MRN:          [id number]  DATE:11/19/2009                            DOB:          02/04/1933    PROBLEM LIST:  1. Hypertension.  2. Palpitations.  3. Hypothyroidism.  4. Hyperlipidemia.  5. Generalized weakness.   INTERVAL HISTORY:  The patient continues to have episodes of occasional  palpitations.  She states that they are worse at night when she lays  down.  They are not associated with syncope, although sometimes she  feels mildly dizzy.  She also describes a typical sensations of feeling  with pulsations in her eyes and if she stares at a wall too long she  feels that the machine come in and out.  She denies any chest  discomfort, headaches, blurry vision, diplopia, no weakness or numbness  in her extremities.   PHYSICAL EXAMINATION:  VITAL SIGNS:  Blood pressure is 140/74, rechecked  manually 146/70.  The patient brings in her home blood pressure  monitoring that was showing wide fluctuations in blood pressure and it  reads 115/58 indicating that it is significantly decreased from our  machine and our annual check, her pulse of 75, sating 96% on room air,  and she weighs 149 pounds.  GENERAL:  No acute distress.  HEENT:  Normocephalic and atraumatic.  HEART:  Regular rate and rhythm.  LUNGS:  Clear.  EXTREMITIES:  Without edema.   Review of the patient's labs since last test she had a renal artery  duplex studies that showed patent renal arteries bilaterally.  Urine,  metanephrines and catecholamines that were within normal limits, plasma  aldosterone was 14.3, renin aldosterone was ordered, but it was not  drawn.  She had a BMP that was within normal limits.   ASSESSMENT AND PLAN:  It may certainly be that what we thought were wide  fluctuations in blood pressure were simply secondary to her home blood  pressure monitoring  device getting falsely low blood pressures.  We  recommended she get a new machine that use at the arm, inside of the  wrist to measure blood pressure.  Because a serum renin level was not  checked.  We will recheck a serum aldosterone and serum renin today to  rule out primary hyperaldosteronism.  Other causes of secondary  hypertension has been effectively ruled out.  The patient continues to  have occasional sensations in the back of her head that she believes  that are associated with hypertension.  When she has a new monitor if  indeed she is having these sensations in the back of her head associated  with hypertension, we will treat the hypertension.  However, if she is  having sensations which she describes a numbness with normal blood  pressures, we could consider an MRI of the brain.  We spent significant  time discussing the possibility that this is anxiety related.  She  should continue getting up to date with her health maintenance including  a mammography and colonoscopy.  If these are all within  normal limits,  it is certainly likely that anxiety is a source of this.  In that regard  in addition to p.r.n. Ativan, she is currently taking, her primary care  condition may want to consider a SSRI for chronic anxiolytic therapy.  Lastly, we will check a TSH and CRP and sed rate.     [name] Muslim, MD  Electronically Signed    SGA/MedQ  DD: 11/19/2009  DT: 11/20/2009  Job #: [id number]   cc:   [name] Lacks. Plotnikov, MD

## 2011-01-18 NOTE — Letter (Signed)
July 14, 2009    Reardan Plotnikov, MD  [address], [place] [address]   RE:  [name], [name]  MRN:  [id number]  /  DOB:  1932-12-27   Dear Dr. Alain Marion:   I had the pleasure of seeing your patient, [name] [name], in Cardiology  clinic today.  As you know, she is a 77 year old female with a recent  history of palpitations and increased fatigue.  She presents to my  office today in sinus bradycardia with a heart rate in the high 40s, low  50s.  She has also been endorsing an ill-described weakness in her  chest.  The palpitations that she initially presented with to your  office have almost completely resolved with the atenolol that you have  prescribed.  Today in clinic, I have decreased her atenolol to once  daily dosing in the hopes that this may resolve some of the bradycardia  she is experiencing.  I have also ordered a 48-hour Holter monitor in  order to evaluate the patient for any arrhythmia, specifically atrial  fibrillation.  Lastly, I have ordered a dobutamine stress echocardiogram  as the ill-defined chest weakness that she is experiencing along with  the increase in fatigue may represent angina.   I hope you are in agreement with this approach.  Please feel free to  contact my office at any time with any questions or concerns.    Sincerely,      Arlee Muslim, MD  Electronically Signed    SGA/MedQ  DD: 07/14/2009  DT: 07/14/2009  Job #: [id number]

## 2011-01-18 NOTE — Assessment & Plan Note (Signed)
[facility] OFFICE NOTE   NAME:[name] [name], [name] [name]                       MRN:          [id number]  DATE:10/30/2009                            DOB:          Aug 15, 1933    PROBLEM LIST:  1. Hypertension.  2. Palpitations.  3. Hypothyroidism.  4. Hyperlipidemia.   INTERVAL HISTORY:  The patient states that at home for the past week,  she has not been taking her hydrochlorothiazide 12.5 mg daily because  she thinks her blood pressure may have been in the 0000000 systolic,  although she is not clear how often or on what days it was that low.  She states that since she has been taking the Bystolic 5 mg daily, she  believes she has had fewer palpitations than before.  Last night, the  patient states she experienced increased generalized weakness and some  numbness over her head.  Here in the office, she is also complaining of  some numb-like feeling over her head and increased generalized weakness.   PHYSICAL EXAMINATION:  VITAL SIGNS:  Here in the office, her blood  pressure is 200/88 in the right arm and in the left arm it is 192/82,  pulse is 60, she is sating 97% on room air.  She weighs 150 pounds.  GENERAL:  No acute distress.  HEENT:  Normocephalic, atraumatic.  HEART:  Regular rate and rhythm.  LUNGS:  Clear.  ABDOMEN:  Soft, nontender.  EXTREMITIES:  Without edema.   ASSESSMENT AND PLAN:  The patient currently has poorly-controlled  hypertension and may be having some neurologic sequelae from this.  In  the office, she was given clonidine 0.1 mg.  Because of her high blood  pressure in association with the subjective numbness, she is feeling in  her head, we will have the patient report to [facility] Room for  further evaluation and treatment.  Upon discharge, the patient should  increase her hydrochlorothiazide to 25 mg daily.  If she has any  hypotension, she should contact our office prior to  stopping or holding  any medication.  We will also proceed with a workup for secondary causes  of hypertension with serum renin and aldosterone levels as well as a  renal artery ultrasound to rule out renal artery stenosis and 24 hour  urine for catecholamines and metanephrines.  I have discussed this case  with the ED physician Dr. Maxwell Caul.  He is expecting [name] [name].  She  will be taken across the street to the ED by her husband.     Arlee Muslim, MD  Electronically Signed   SGA/MedQ  DD: 10/30/2009  DT: 10/30/2009  Job #: [id number]

## 2011-01-18 NOTE — Letter (Signed)
August 04, 2009    Dr. Phoebe Sharps and [facility]  [address]  [place], [place] [address]   RE:  DESTANY, BEAUMAN  MRN:  [id number]  /  DOB:  1933-04-26   RE:  Drystal Nersesian   Dear Dr. Noemi Chapel:   I am writing to you regarding your patient [name] [name].  As you know  she is a 77 year old female who is likely in need of knee arthroplasty.  I have seen her in Cardiology Clinic to evaluate palpitations that she  has been experiencing.  The patient underwent a dobutamine stress echo  that was negative for any inducible ischemia.  She also has worn a  Holter monitor showing only premature ventricular contractions .  At  this time the patient would be low risk for knee surgery.   Thank you for the referral of Ms. Economy.  Please contact my office if  there are any questions or concerns.    Sincerely,      Arlee Muslim, MD  Electronically Signed    SGA/MedQ  DD: 08/04/2009  DT: 08/04/2009  Job #: [id number]

## 2011-01-21 NOTE — Assessment & Plan Note (Signed)
[facility] OFFICE NOTE   NAME:[name] [name], [name] [name]                       MRN:          [id number]  DATE:03/22/2006                            DOB:          [birth year]    HISTORY OF PRESENT ILLNESS:  The patient is a 77 year old female who  presents for a well-woman examination.   PAST MEDICAL HISTORY:  She carries the diagnosis of hypertension, monoclonal  gammopathy, and dyslipidemia.  She recently had bronchitis and went to see a  doctor in [place].   FAMILY HISTORY:  As per October 11, 2004, note.   SOCIAL HISTORY:  As per October 11, 2004, note.   ALLERGIES:  SULFA.   CURRENT MEDICATIONS:  Reviewed.   REVIEW OF SYSTEMS:  Negative for chest pain or shortness of breath.  Remaining dry cough without shortness of breath.  No syncope.  No blood in  the stools.  The rest is negative.   PHYSICAL EXAMINATION:  VITAL SIGNS:  Blood pressure 150/67, pulse 75, temp  98.6, weight 166 pounds.  GENERAL:  She looks well, she is in no acute distress.  HEENT:  Moist mucosa.  NECK:  Supple.  No thyromegaly or bruit.  LUNGS:  Clear, no wheezes or rales.  HEART:  S1, S2 no gallop, no murmur.  ABDOMEN:  Soft, nontender, no organomegaly, no masses felt.  EXTREMITIES:  Lower extremities without edema.  NEUROLOGIC:  She is alert and cooperative.  Denies being depressed.  SKIN:  Clear.   LABORATORY DATA:  None available.   ASSESSMENT/PLAN:  1.  ___________ discussed.  Healthy lifestyle discussed.  Given information      about Zostavax.  Obtain lab work appropriate for age.  She had      colonoscopy in 2006 by Dr. Earlean Shawl.  She is status post partial      hysterectomy.  2.  Cough was bronchiectic, could be aggravated by enalapril.  We will      discontinue enalapril, given Avapro 160 mg daily  instead.  Obtain chest      x-ray, Robitussin AC 5-10 mL q. 4 hours p.r.n.  3.  Hypertension.  Plan as above.  4.  Abdominal  discomfort.  Obtain ultrasound, transvaginal/pelvic.  I will      see her back in two months.                                   Walker Kehr, MD   AP/MedQ  DD:  03/23/2006  DT:  03/24/2006  Job #:  [id number]

## 2011-01-21 NOTE — H&P (Signed)
NAME:  [name] [name], [name] [name]                          ACCOUNT NO.:  [id number]   MEDICAL RECORD NO.:  [id number]                   PATIENT TYPE:  INP   LOCATION:  3172                                 FACILITY:  [facility]   PHYSICIAN:  Leeroy Cha, M.D.                DATE OF BIRTH:  Dec 24, 1932   DATE OF ADMISSION:  09/23/2003  DATE OF DISCHARGE:                                HISTORY & PHYSICAL   [name] [name] is a lady who was seen by me in my office about two weeks ago  because of back pain radiating down to both her legs which gets worse with  standing or walking.  This probably had been going on for several months to  the point that lately, when she goes to the grocery store, it is quite  difficult for her to move around.  She feels that she has some heaviness in  both legs and a tingling sensation.  She gets relief from the pain when she  sits.  Patient has had chiropractor treatment and epidural injection without  any improvement.   PAST MEDICAL HISTORY:  1. Shoulder surgery.  2. Bladder surgery.  3. GI surgery.   ALLERGIES:  SULFA.   SOCIAL HISTORY:  Negative.   FAMILY HISTORY:  Unremarkable.   REVIEW OF SYSTEMS:  Positive for high blood pressure, hiatal disease, back  pain.   PHYSICAL EXAMINATION:  GENERAL:  Patient came into my office, and she was  walking with a shorter stance.  HEENT:  Normal.  NECK:  Normal.  LUNGS:  Clear.  HEART:  Heart sounds normal.  ABDOMEN:  Normal.  EXTREMITIES:  Normal.  Pulses clear.  NEUROLOGIC:  Mental status normal.  Cranial nerves normal.  Strength:  She  has some weakness on dorsiflexion of both feet.  Sensation:  Normal.  Reflexes are 1+.  No Babinski's.   The MRI showed that indeed she has a stepoff at L4-5, which gets better with  extension.  The MRI has a severe case of stenosis at level of 3-4 and 4-5.   CLINICAL IMPRESSION:  Neurogenic claudication with lumbar stenosis at the  level of 3-4 and 4-5 with unstable spondylolisthesis  at the level of 4-5.   RECOMMENDATIONS:  I talked to the patient at length.  The patient wants to  proceed with surgery.  The procedure will be a bilateral 3-4 laminectomy and  foraminotomy.  I at the level of L4-5 because of the unstable  spondylolisthesis, we are going to proceed with an interbody fusion, total  diskectomy, and __________.  The patient knows the risks of associated  infections, CSF leak, worsening pain, paralysis, reason for the surgery,  failure of the graft, anemia, and damage to the vessels of the abdomen.  Leeroy Cha, M.D.    EB/MEDQ  D:  09/23/2003  T:  09/23/2003  Job:  [id number]

## 2011-01-21 NOTE — Discharge Summary (Signed)
[name] [name], [name] [name]                          ACCOUNT NO.:  [id number]   MEDICAL RECORD NO.:  [id number]                   PATIENT TYPE:  INP   LOCATION:  3032                                 FACILITY:  [facility]   PHYSICIAN:  Hosie Spangle, M.D.            DATE OF BIRTH:  12/28/1932   DATE OF ADMISSION:  09/23/2003  DATE OF DISCHARGE:  09/27/2003                                 DISCHARGE SUMMARY   HISTORY OF PRESENT ILLNESS:  The patient is a 77 year old woman, patient of  Dr. Harley Hallmark, who had lumbar radicular pain radiating through her lower  extremities. Had a history of bladder surgery in the past. General  examination was unremarkable. Dr. Joya Salm found some weakness in her dorsi-  flexors.   HOSPITAL COURSE:  The patient was admitted by Dr. Joya Salm, who performed a  lumbar laminectomy and a Gill procedure and a lumbar fusion. She did fairly  well following surgery. She has had some difficulties with voiding and was  seen in consultation by Dr. Terance Hart, who started her on Flomax. Apparently,  she had some problems with the Flomax and she was changed to Hytrin 2 mg  q.h.s. She is seen in consultation by physical therapy and occupational  therapy. At this point, she is up at ambulating regularly through the halls.  She is afebrile and vital signs are stable. She has asked to go ahead and be  discharged to home and she was given instructions regarding wound care and  activity.   FOLLOW UP:  She is to return to see Dr. Joya Salm in 2 to 3 weeks with an AP  and lateral lumbar spine x-rays. She has had a problem with a hemorrhoid  that has developed and she plans on contacting Dr. Lennie Hummer, who has  previously taken care of her. She is scheduled for followup in 1 week with  Dr. Terance Hart regarding her voiding.   DISCHARGE MEDICATIONS:  She is given prescription for Percocet 1 or 2  tablets p.o. q. 4 to 6 hours p.r.n. pain, #40 tablets prescribed and no  refills. She is also advised  to use either Advil or Aleve to help decrease  her inflammation.   DISCHARGE DIAGNOSES:  Lumbar stenosis, lumbar spinal listhesis, lumbar  radiculopathy, and lumbar degenerative disk disease.                                                Hosie Spangle, M.D.    RWN/MEDQ  D:  09/27/2003  T:  09/28/2003  Job:  [id number]   cc:   Leeroy Cha, M.D.  [address]  [place], [place] [address]  Fax: [phone]

## 2011-01-21 NOTE — Consult Note (Signed)
[name] [name], PURA                          ACCOUNT NO.:  [id number]   MEDICAL RECORD NO.:  [id number]                   PATIENT TYPE:  INP   LOCATION:  3032                                 FACILITY:  [facility]   PHYSICIAN:  Lucina Mellow. Terance Hart, M.D.             DATE OF BIRTH:  [birth year]   DATE OF CONSULTATION:  09/26/2003  DATE OF DISCHARGE:  09/27/2003                                   CONSULTATION   I was asked to see this 77 year old white female, who has had trouble  voiding postop, lumbar back surgery on September 23, 2003, for stenosis and  radiculopathy and DJD.  She had a Foley catheter at the time of surgery and  when it was removed, she has been unable to void until late this afternoon  when she finally voided 150 mL on her own.  Before that, she was undergoing  in and out catheterization.   Urologically, I saw her in 2001, where she had a third-degree cystocele, and  she underwent a vaginal sacropexy and a suprapubic bladder suspension and  had been voiding well since then without any particular frequency and no  incontinence.  She is in general good health and takes Synthroid, Enalapril,  and multiple various vitamins.  She is allergic to SULFA.   On examination, she is alert and oriented.  Skin is warm and dry, in no  acute distress.  She has a back brace on.   IMPRESSION:  Postop urinary retention.   PLAN:  1. Continue in and out cath urine.  2. Start Flomax 0.4 mg daily.  This hopefully will just be of temporary     utilization.  3. Discharge home when she is voiding okay or teach her to do in and out     self cath.  4. See me 1 week postdischarge home in the office for follow-up evaluation.                                               Lucina Mellow. Terance Hart, M.D.    LJP/MEDQ  D:  09/26/2003  T:  09/27/2003  Job:  [id number]   cc:   Leeroy Cha, M.D.  [address]  [place], [place] [address]  Fax: [phone]

## 2011-01-21 NOTE — Op Note (Signed)
[name] [name], [name] [name]                          ACCOUNT NO.:  [id number]   MEDICAL RECORD NO.:  [id number]                   PATIENT TYPE:  INP   LOCATION:  3032                                 FACILITY:  [facility]   PHYSICIAN:  Leeroy Cha, M.D.                DATE OF BIRTH:  May 18, 1933   DATE OF PROCEDURE:  09/23/2003  DATE OF DISCHARGE:                                 OPERATIVE REPORT   PREOPERATIVE. DIAGNOSES:  1. L3-4, L4-5 and L5-S1 stenosis with neurogenic claudication.  2. Unstable L4-L5 spondylolisthesis.  3. Radiculopathy.  4. Degenerative disk disease.  5. Lateral recess stenosis.   POSTOPERATIVE DIAGNOSES:  1. L3-4, L4-5 and L5-S1 stenosis with neurogenic claudication.  2. Unstable L4-L5 spondylolisthesis.  3. Radiculopathy.  4. Degenerative disk disease.  5. Lateral recess stenosis.   PROCEDURE:  Bilateral L3-L5 laminectomy, decompressive laminectomy at the  level of L4.  L4-5 interbody fusion, plus L4-5 diskectomy, pedicle screw L4-  5, posterior interbody fusion with Allograft, L3-4, L5-S1.  Long segmental  screw fixation L4-5.   SURGEON:  Leeroy Cha, M.D.   INDICATIONS:  [name] [name] is a lady who is 77 years old complaining of back  pain with radiation to both legs.  The patient has atypical claudication.  The patient had severe stenosis at the level of L3-4, L4-5 and also L5-S1.  We saw by x-ray she has stable spondylolisthesis of the level of L4-5.  The  patient has failed conservative treatment.  Surgery was advised.   DESCRIPTION OF PROCEDURE:  The patient was taken to the operating room.  After intubation she was appropriately positioned in the prone manner.  A  midline incision from L3 to L5 as well was made.  The muscle and fascia were  retracted laterally until we were able to see the transverse process of L3,  L4 and L5.  Today with the x-ray we localized the area between L4-5.  Using  the Stille rongeur, we removed the spinous process of L3, L4  and L5.  With  the [place] as well as the Kerrison punch and the drill, we did as bilateral  laminectomy at the level of L3 and L5.  Then using a ___________ procedure  at the level of L4, we removed all of the spinal stenosis but also the  lamina and the facet of L4.  The patient had a quite a bit of thickening of  the yellow ligament. Foraminotomy to decompress the L4 and S1 was made.  Then we identified the disk space between L4-5.  Incision was made  bilaterally, and total gross diskectomy was achieved.  Using the curet, we  were able to remove the end plate at this level.  Having done this, having  the L4-5 clean, we introduced two pieces of allograft into the body of 10 x  24.  Medially and posterior laterally the disk was filled up using a  mix of  allograft.  From then on, using the C-arm, we were able to visualize the  pedicle of L4-5.  Pedicle probe was inserted followed by a tapper.  Then  four screws of 6.5 x 40 were inserted at the L4 level.  The AP and lateral  showed good position of the pedicle screws.  From then, a rod from L4-L5  bilaterally was done.  They were secured in place with caps.  Then we went  laterally.  We removed the periosteum of L3, 4 and 5 and we removed the ala  with the dissector.  Using a mix of auto and allograft, the area  was filled up with disk material.  From then on, the area was irrigated.  We  found out indeed there was a good decompression from the L3, L4, L5 and S1  nerve root.  Then the area was irrigated, and the wound was closed with  Vicryl and Steri-Strips.                                               Leeroy Cha, M.D.    EB/MEDQ  D:  09/23/2003  T:  09/24/2003  Job:  [id number]

## 2011-01-21 NOTE — Op Note (Signed)
[place]. [facility]  Patient:    [name] [name], [name] [name] Visit Number: [id number] MRN: [id number]          Service Type: Attending:  Audree Camel. Noemi Chapel, M.D. Dictated by:   Audree Camel Noemi Chapel, M.D. Proc. Date: 07/30/01                             Operative Report  PREOPERATIVE DIAGNOSIS:  Right shoulder rotator cuff tear.  POSTOPERATIVE DIAGNOSES: 1. Right shoulder rotator cuff tear. 2. Right shoulder partial labrum tear.  PROCEDURE: 1. Right shoulder EUA followed by arthroscopic partial labrum tear    debridement. 2. Right shoulder rotator cuff repair.  SURGEON:  Audree Camel. Noemi Chapel, M.D.  ASSISTANT:  Matthew Saras, P.A.  ANESTHESIA:  General.  OPERATIVE TIME:  One hour.  COMPLICATIONS:  None.  INDICATIONS FOR PROCEDURE:  [name] [name] is a 77 year old woman who has had 10 to 12 months of right shoulder pain increasing in nature with signs and symptoms and MRI documenting a rotator cuff tear who has failed conservative care and is now to undergo arthroscopy and rotator cuff repair.  DESCRIPTION:  [name] [name] was brought to the operating room on July 30, 2001 after a supraclavicular block had been placed in the holding room.  She was placed on the operative table in the supine position.  Her right shoulder was examined under anesthesia.  She had full range of motion and her shoulder was stable to ligamentous exam.  After this was done, she was placed in a beach chair position and her shoulder and arm were prepped using sterile Betadine and draped using sterile technique.  She received Ancef 1 g IV preoperatively for prophylaxis.  Initially through the arthroscopy through a posterior arthroscopic portal, the arthroscope with a pump attached was placed, and through an anterior portal an arthroscopic probe was placed.  She had undergone previous sterile prepping and draping.  On initial inspection, the articular cartilage glenohumeral joint was intact.   Anterior labrum with a small tear, 20%, which was debrided.  Superior labrum and biceps tendon anchor were intact.  Biceps tendon were intact.  Inferior labrum and anterior/inferior glenohumeral ligaments were intact.  The posterior labrum was intact.  The biceps tendon itself was intact.  The rotator cuff showed a complete tear of the supraspinatus which was partially debrided arthroscopically, partial tear of the infraspinatus which was also debrided. The inferior capsule recess is free of pathology.  The subacromial space was entered and a lateral arthroscopic portal was made.  On initial inspection there was found to be significantly thickened bursitis which was resected. Underneath this, a rotator cuff tear was visualized and further sharply debrided.  A subacromial decompression was then carried out, removing 6-8 mm of the undersurface of the anterior, anterolateral, and anteromedial acromion, and a CA ligament release carried out as well.  After this was done, then through the lateral portal a 3 cm deltoid-splitting incision was made.  The underlying subcutaneous tissues were incised in line with the skin incision.  Subacromial space was entered.  Rotator cuff tear was well visualized.  It was repaired using, initially, attempt at an Arthrex suture anchor but this pulled out through the soft bone, and thus two separate #2 Ethibond sutures were placed through the rotator cuff tear and then through, using the needle to go through the bone in the greater tuberosity and then tied down, thus resecuring the tear back down to the  greater tuberosity very tightly.  After this was done, the shoulder could be brought through a full range of motion with no impingement on the repair.  At this point, a subacromial pain catheter was placed for postoperative pain control and then the wound was further irrigated and then the deltoid fascia closed with 2-0 Vicryl, subcutaneous tissues closed with 2-0  Vicryl, subcuticular layer closed with 3-0 Prolene, Steri-Strips were applied, sterile dressings and a sling applied, and the patient awakened and taken to recovery room in stable condition.  FOLLOW-UP CARE:  [name] [name] will be followed overnight at the recovery care center for IV pain control and neurovascular monitoring.  Discharge tomorrow on Percocet for pain.  She will have her pain catheter removed by her and her family in 48 hours per our instructions.  See her back in the office in a week for sutures out and follow-up. Dictated by:   Audree Camel Noemi Chapel, M.D. Attending:  Audree Camel. Noemi Chapel, M.D. DD:  07/30/01 TD:  07/30/01 Job: [id number]

## 2011-02-01 ENCOUNTER — Encounter: Payer: Self-pay | Admitting: Internal Medicine

## 2011-02-03 ENCOUNTER — Telehealth: Payer: Self-pay | Admitting: Internal Medicine

## 2011-02-03 ENCOUNTER — Other Ambulatory Visit (INDEPENDENT_AMBULATORY_CARE_PROVIDER_SITE_OTHER): Payer: Medicare Other

## 2011-02-03 ENCOUNTER — Encounter: Payer: Self-pay | Admitting: Internal Medicine

## 2011-02-03 ENCOUNTER — Ambulatory Visit (INDEPENDENT_AMBULATORY_CARE_PROVIDER_SITE_OTHER): Payer: Medicare Other | Admitting: Internal Medicine

## 2011-02-03 DIAGNOSIS — E538 Deficiency of other specified B group vitamins: Secondary | ICD-10-CM

## 2011-02-03 DIAGNOSIS — K219 Gastro-esophageal reflux disease without esophagitis: Secondary | ICD-10-CM

## 2011-02-03 DIAGNOSIS — D472 Monoclonal gammopathy: Secondary | ICD-10-CM

## 2011-02-03 DIAGNOSIS — F341 Dysthymic disorder: Secondary | ICD-10-CM

## 2011-02-03 DIAGNOSIS — R0602 Shortness of breath: Secondary | ICD-10-CM

## 2011-02-03 LAB — URINALYSIS
Bilirubin Urine: NEGATIVE
Hgb urine dipstick: NEGATIVE
Ketones, ur: NEGATIVE
Specific Gravity, Urine: 1.015 (ref 1.000–1.030)
Urine Glucose: NEGATIVE
Urobilinogen, UA: 0.2 (ref 0.0–1.0)

## 2011-02-03 LAB — CBC WITH DIFFERENTIAL/PLATELET
Basophils Relative: 0.4 % (ref 0.0–3.0)
Eosinophils Relative: 5.1 % — ABNORMAL HIGH (ref 0.0–5.0)
Lymphocytes Relative: 19.1 % (ref 12.0–46.0)
MCV: 93.2 fl (ref 78.0–100.0)
Monocytes Absolute: 0.5 10*3/uL (ref 0.1–1.0)
Neutrophils Relative %: 65.7 % (ref 43.0–77.0)
RBC: 3.46 Mil/uL — ABNORMAL LOW (ref 3.87–5.11)
WBC: 5.3 10*3/uL (ref 4.5–10.5)

## 2011-02-03 LAB — COMPREHENSIVE METABOLIC PANEL
Albumin: 3.9 g/dL (ref 3.5–5.2)
Alkaline Phosphatase: 53 U/L (ref 39–117)
BUN: 23 mg/dL (ref 6–23)
Glucose, Bld: 94 mg/dL (ref 70–99)
Potassium: 4.6 mEq/L (ref 3.5–5.1)

## 2011-02-03 LAB — TSH: TSH: 0.93 u[IU]/mL (ref 0.35–5.50)

## 2011-02-03 MED ORDER — CITALOPRAM HYDROBROMIDE 20 MG PO TABS: 20.0000 mg | ORAL_TABLET | Freq: Every day | ORAL | Status: DC

## 2011-02-03 MED ORDER — HYDROCODONE-ACETAMINOPHEN 10-325 MG PO TABS: 1.0000 | ORAL_TABLET | Freq: Four times a day (QID) | ORAL | Status: DC | PRN

## 2011-02-03 NOTE — Assessment & Plan Note (Signed)
On Rx 

## 2011-02-03 NOTE — Assessment & Plan Note (Signed)
better 

## 2011-02-03 NOTE — Patient Instructions (Signed)
Try Black Kohosh or Soy tablets for hot flashes

## 2011-02-03 NOTE — Assessment & Plan Note (Signed)
Using 2 inj/mo

## 2011-02-03 NOTE — Assessment & Plan Note (Signed)
Being monitored

## 2011-02-03 NOTE — Progress Notes (Signed)
  Subjective:    Patient ID: [name] [name], female    DOB: 1932/10/18, 77 y.o.   MRN: [id number]  HPI   The patient is here to follow up on chronic B12 def, depression, anxiety, and chronic moderate OA symptoms controlled with medicines, diet and exercise.   Review of Systems  Constitutional: Positive for fatigue. Negative for fever and appetite change.  HENT: Negative for nosebleeds, sneezing and drooling.   Eyes: Negative for pain.  Respiratory: Negative for cough.   Cardiovascular: Negative for leg swelling.  Gastrointestinal: Negative for blood in stool.  Genitourinary: Negative for flank pain and menstrual problem.  Musculoskeletal: Negative for back pain.  Psychiatric/Behavioral: Negative for sleep disturbance and dysphoric mood.       Objective:   Physical Exam  Constitutional: She appears well-developed and well-nourished. No distress.  HENT:  Head: Normocephalic.  Right Ear: External ear normal.  Left Ear: External ear normal.  Nose: Nose normal.  Mouth/Throat: Oropharynx is clear and moist.  Eyes: Conjunctivae are normal. Pupils are equal, round, and reactive to light. Right eye exhibits no discharge. Left eye exhibits no discharge.  Neck: Normal range of motion. Neck supple. No JVD present. No tracheal deviation present. No thyromegaly present.  Cardiovascular: Normal rate, regular rhythm and normal heart sounds.   Pulmonary/Chest: No stridor. No respiratory distress. She has no wheezes.  Abdominal: Soft. Bowel sounds are normal. She exhibits no distension and no mass. There is no tenderness. There is no rebound and no guarding.  Musculoskeletal: She exhibits no edema and no tenderness.  Lymphadenopathy:    She has no cervical adenopathy.  Neurological: She displays normal reflexes. No cranial nerve deficit. She exhibits normal muscle tone. Coordination normal.  Skin: No rash noted. No erythema.  Psychiatric: Her behavior is normal. Judgment and thought content  normal.       anxious          Assessment & Plan:

## 2011-02-03 NOTE — Telephone Encounter (Signed)
[name] [name], please, inform patient that all labs are normal Thx

## 2011-02-04 NOTE — Telephone Encounter (Signed)
Left detailed mess informing pt of below.  

## 2011-03-07 ENCOUNTER — Encounter: Payer: Self-pay | Admitting: Cardiovascular Disease

## 2011-04-08 ENCOUNTER — Other Ambulatory Visit: Payer: Self-pay | Admitting: Internal Medicine

## 2011-04-11 ENCOUNTER — Telehealth: Payer: Self-pay | Admitting: *Deleted

## 2011-04-11 ENCOUNTER — Other Ambulatory Visit: Payer: Self-pay | Admitting: Internal Medicine

## 2011-04-11 NOTE — Telephone Encounter (Signed)
Requested Medications     LORazepam (ATIVAN) 0.5 MG tablet [Pharmacy Med Name: LORAZEPAM 0.5MG  TABLETS]    TAKE 1 TABLET THREE TIMES DAILY AS NEEDED    Disp: 90 tablet R: 0 Start: 04/11/2011 Class: Normal    Originally ordered on: 11/02/2010 Last refill: 02/22/2011 Order History

## 2011-04-11 NOTE — Telephone Encounter (Signed)
OK to fill this prescription with additional refills x5 mo Thank you!  

## 2011-04-12 MED ORDER — LORAZEPAM 0.5 MG PO TABS: 0.5000 mg | ORAL_TABLET | Freq: Three times a day (TID) | ORAL | Status: DC | PRN

## 2011-04-14 ENCOUNTER — Other Ambulatory Visit: Payer: Self-pay | Admitting: *Deleted

## 2011-04-21 ENCOUNTER — Telehealth: Payer: Self-pay | Admitting: *Deleted

## 2011-04-21 NOTE — Telephone Encounter (Signed)
PA for omeprazole approved from 7.25.12 until 8.15.13. Pharmacy and pt informed.

## 2011-05-11 ENCOUNTER — Encounter: Payer: Self-pay | Admitting: Internal Medicine

## 2011-05-11 ENCOUNTER — Other Ambulatory Visit (INDEPENDENT_AMBULATORY_CARE_PROVIDER_SITE_OTHER): Payer: Medicare Other

## 2011-05-11 ENCOUNTER — Ambulatory Visit (INDEPENDENT_AMBULATORY_CARE_PROVIDER_SITE_OTHER): Payer: Medicare Other | Admitting: Internal Medicine

## 2011-05-11 ENCOUNTER — Ambulatory Visit (INDEPENDENT_AMBULATORY_CARE_PROVIDER_SITE_OTHER)
Admission: RE | Admit: 2011-05-11 | Discharge: 2011-05-11 | Disposition: A | Discharge status: Home / Self Care | Payer: Medicare Other | Source: Ambulatory Visit | Attending: Internal Medicine | Admitting: Internal Medicine

## 2011-05-11 VITALS — BP 170/80 | HR 80 | Temp 98.3°F | Resp 16 | Wt 145.0 lb

## 2011-05-11 DIAGNOSIS — R0602 Shortness of breath: Secondary | ICD-10-CM

## 2011-05-11 DIAGNOSIS — Z23 Encounter for immunization: Secondary | ICD-10-CM

## 2011-05-11 DIAGNOSIS — F341 Dysthymic disorder: Secondary | ICD-10-CM

## 2011-05-11 LAB — COMPREHENSIVE METABOLIC PANEL
ALT: 15 U/L (ref 0–35)
Albumin: 3.7 g/dL (ref 3.5–5.2)
CO2: 29 mEq/L (ref 19–32)
Calcium: 9.1 mg/dL (ref 8.4–10.5)
Chloride: 102 mEq/L (ref 96–112)
GFR: 39.12 mL/min — ABNORMAL LOW (ref >60.00)
Glucose, Bld: 94 mg/dL (ref 70–99)
Potassium: 4.3 mEq/L (ref 3.5–5.1)
Sodium: 139 mEq/L (ref 135–145)
Total Bilirubin: 0.4 mg/dL (ref 0.3–1.2)
Total Protein: 8.5 g/dL — ABNORMAL HIGH (ref 6.0–8.3)

## 2011-05-11 LAB — TSH: TSH: 0.71 u[IU]/mL (ref 0.35–5.50)

## 2011-05-11 LAB — CBC WITH DIFFERENTIAL/PLATELET
Basophils Absolute: 0 10*3/uL (ref 0.0–0.1)
Eosinophils Relative: 5.9 % — ABNORMAL HIGH (ref 0.0–5.0)
Lymphocytes Relative: 24.2 % (ref 12.0–46.0)
Monocytes Relative: 9.8 % (ref 3.0–12.0)
Neutrophils Relative %: 59.6 % (ref 43.0–77.0)
Platelets: 205 10*3/uL (ref 150.0–400.0)
RDW: 13 % (ref 11.5–14.6)
WBC: 5.7 10*3/uL (ref 4.5–10.5)

## 2011-05-11 LAB — URINALYSIS
Bilirubin Urine: NEGATIVE
Leukocytes, UA: NEGATIVE
Nitrite: NEGATIVE
Specific Gravity, Urine: 1.015 (ref 1.000–1.030)
pH: 6 (ref 5.0–8.0)

## 2011-05-11 MED ORDER — FLUTICASONE-SALMETEROL 250-50 MCG/DOSE IN AEPB: 1.0000 | INHALATION_SPRAY | Freq: Two times a day (BID) | RESPIRATORY_TRACT | Status: DC

## 2011-05-11 NOTE — Assessment & Plan Note (Signed)
Continue with current prescription therapy as reflected on the Med list.  

## 2011-05-11 NOTE — Assessment & Plan Note (Addendum)
Anxiety related - chronic. Not better. S/p previous card w/up Will get a CXR

## 2011-05-11 NOTE — Progress Notes (Signed)
  Subjective:    Patient ID: [name] [name], female    DOB: 1933-08-27, 77 y.o.   MRN: [id number]  HPI  C/o SOB BP is nl at home  Review of Systems  Constitutional: Positive for fatigue. Negative for chills, activity change, appetite change and unexpected weight change.  HENT: Negative for congestion, mouth sores and sinus pressure.   Eyes: Negative for visual disturbance.  Respiratory: Positive for cough and shortness of breath. Negative for chest tightness.   Gastrointestinal: Negative for nausea and abdominal pain.  Genitourinary: Negative for frequency, difficulty urinating and vaginal pain.  Musculoskeletal: Negative for back pain and gait problem.  Skin: Negative for pallor and rash.  Neurological: Negative for dizziness, tremors, weakness, numbness and headaches.  Psychiatric/Behavioral: Negative for suicidal ideas, confusion and sleep disturbance. The patient is nervous/anxious.    Wt Readings from Last 3 Encounters:  05/11/11 145 lb (65.772 kg)  02/03/11 147 lb (66.679 kg)  09/22/10 146 lb (66.225 kg)       Objective:   Physical Exam  Constitutional: She appears well-developed and well-nourished. No distress.  HENT:  Head: Normocephalic.  Right Ear: External ear normal.  Left Ear: External ear normal.  Nose: Nose normal.  Mouth/Throat: Oropharynx is clear and moist.  Eyes: Conjunctivae are normal. Pupils are equal, round, and reactive to light. Right eye exhibits no discharge. Left eye exhibits no discharge.  Neck: Normal range of motion. Neck supple. No JVD present. No tracheal deviation present. No thyromegaly present.  Cardiovascular: Normal rate, regular rhythm and normal heart sounds.   Pulmonary/Chest: No stridor. No respiratory distress. She has no wheezes.  Abdominal: Soft. Bowel sounds are normal. She exhibits no distension and no mass. There is no tenderness. There is no rebound and no guarding.  Musculoskeletal: She exhibits no edema and no tenderness.    Lymphadenopathy:    She has no cervical adenopathy.  Neurological: She displays normal reflexes. No cranial nerve deficit. She exhibits normal muscle tone. Coordination normal.  Skin: No rash noted. No erythema.  Psychiatric: She has a normal mood and affect. Her behavior is normal. Judgment and thought content normal.          Assessment & Plan:

## 2011-05-13 ENCOUNTER — Telehealth: Payer: Self-pay | Admitting: Internal Medicine

## 2011-05-13 NOTE — Telephone Encounter (Signed)
Labs are ok too Thx

## 2011-05-13 NOTE — Telephone Encounter (Signed)
[name] [name], please, inform patient that her CXR was ok [facility]

## 2011-05-13 NOTE — Telephone Encounter (Signed)
Patient informed. 

## 2011-05-19 ENCOUNTER — Other Ambulatory Visit: Payer: Self-pay | Admitting: Internal Medicine

## 2011-06-06 ENCOUNTER — Encounter: Payer: Self-pay | Admitting: Internal Medicine

## 2011-06-13 ENCOUNTER — Ambulatory Visit (INDEPENDENT_AMBULATORY_CARE_PROVIDER_SITE_OTHER): Payer: Medicare Other | Admitting: Internal Medicine

## 2011-06-13 ENCOUNTER — Encounter: Payer: Self-pay | Admitting: Internal Medicine

## 2011-06-13 VITALS — BP 140/64 | HR 68 | Temp 98.3°F | Resp 16 | Wt 146.0 lb

## 2011-06-13 DIAGNOSIS — R7309 Other abnormal glucose: Secondary | ICD-10-CM

## 2011-06-13 DIAGNOSIS — F411 Generalized anxiety disorder: Secondary | ICD-10-CM

## 2011-06-13 DIAGNOSIS — E039 Hypothyroidism, unspecified: Secondary | ICD-10-CM

## 2011-06-13 DIAGNOSIS — R0602 Shortness of breath: Secondary | ICD-10-CM

## 2011-06-13 DIAGNOSIS — D649 Anemia, unspecified: Secondary | ICD-10-CM

## 2011-06-13 DIAGNOSIS — I10 Essential (primary) hypertension: Secondary | ICD-10-CM

## 2011-06-13 DIAGNOSIS — M1990 Unspecified osteoarthritis, unspecified site: Secondary | ICD-10-CM

## 2011-06-13 DIAGNOSIS — E785 Hyperlipidemia, unspecified: Secondary | ICD-10-CM

## 2011-06-13 DIAGNOSIS — R739 Hyperglycemia, unspecified: Secondary | ICD-10-CM

## 2011-06-13 DIAGNOSIS — D472 Monoclonal gammopathy: Secondary | ICD-10-CM

## 2011-06-13 DIAGNOSIS — F341 Dysthymic disorder: Secondary | ICD-10-CM

## 2011-06-13 DIAGNOSIS — E538 Deficiency of other specified B group vitamins: Secondary | ICD-10-CM

## 2011-06-13 NOTE — Assessment & Plan Note (Signed)
Continue with current prescription therapy as reflected on the Med list.  

## 2011-06-13 NOTE — Patient Instructions (Signed)
Try to take Citalopram at night Try to stop Lovastatin x 1-2 monthx

## 2011-06-13 NOTE — Assessment & Plan Note (Signed)
OK to hold Lovastatin

## 2011-06-13 NOTE — Assessment & Plan Note (Signed)
Hem f/u q 12 mo

## 2011-06-13 NOTE — Assessment & Plan Note (Signed)
Better  

## 2011-06-13 NOTE — Progress Notes (Signed)
  Subjective:    Patient ID: [name] [name], female    DOB: [birth year], 77 y.o.   MRN: [id number]  HPI   The patient is here to follow up on chronic depression, anxiety, headaches and chronic moderate fibromyalgia symptoms controlled with medicines F/u GERD, fatigue, gammopathy Wt Readings from Last 3 Encounters:  06/13/11 146 lb (66.225 kg)  05/11/11 145 lb (65.772 kg)  02/03/11 147 lb (66.679 kg)     Review of Systems  Constitutional: Positive for fatigue. Negative for chills, activity change, appetite change and unexpected weight change.  HENT: Negative for congestion, mouth sores and sinus pressure.   Eyes: Negative for visual disturbance.  Respiratory: Negative for cough and chest tightness.   Gastrointestinal: Negative for nausea and abdominal pain.  Genitourinary: Negative for frequency, difficulty urinating and vaginal pain.  Musculoskeletal: Negative for back pain and gait problem.  Skin: Negative for pallor and rash.  Neurological: Negative for dizziness, tremors, weakness, numbness and headaches.  Psychiatric/Behavioral: Negative for confusion and sleep disturbance. The patient is nervous/anxious.        Objective:   Physical Exam  Constitutional: She appears well-developed and well-nourished. No distress.  HENT:  Head: Normocephalic.  Right Ear: External ear normal.  Left Ear: External ear normal.  Nose: Nose normal.  Mouth/Throat: Oropharynx is clear and moist.  Eyes: Conjunctivae are normal. Pupils are equal, round, and reactive to light. Right eye exhibits no discharge. Left eye exhibits no discharge.  Neck: Normal range of motion. Neck supple. No JVD present. No tracheal deviation present. No thyromegaly present.  Cardiovascular: Normal rate, regular rhythm and normal heart sounds.   Pulmonary/Chest: No stridor. No respiratory distress. She has no wheezes.  Abdominal: Soft. Bowel sounds are normal. She exhibits no distension and no mass. There is no  tenderness. There is no rebound and no guarding.  Musculoskeletal: She exhibits no edema and no tenderness.  Lymphadenopathy:    She has no cervical adenopathy.  Neurological: She displays normal reflexes. No cranial nerve deficit. She exhibits normal muscle tone. Coordination normal.  Skin: No rash noted. No erythema.  Psychiatric: She has a normal mood and affect. Her behavior is normal. Judgment and thought content normal.    Lab Results  Component Value Date   WBC 5.7 05/11/2011   HGB 10.9* 05/11/2011   HCT 32.1* 05/11/2011   PLT 205.0 05/11/2011   GLUCOSE 94 05/11/2011   CHOL 192 11/20/2006   TRIG 166* 11/20/2006   HDL 41.1 11/20/2006   LDLCALC 118* 11/20/2006   ALT 15 05/11/2011   AST 18 05/11/2011   NA 139 05/11/2011   K 4.3 05/11/2011   CL 102 05/11/2011   CREATININE 1.4* 05/11/2011   BUN 19 05/11/2011   CO2 29 05/11/2011   TSH 0.71 05/11/2011         Assessment & Plan:

## 2011-06-13 NOTE — Assessment & Plan Note (Signed)
Will monitor labs.

## 2011-08-01 ENCOUNTER — Telehealth: Payer: Self-pay | Admitting: *Deleted

## 2011-08-01 NOTE — Telephone Encounter (Signed)
OK to fill this prescription with additional refills x1 Thank you!  

## 2011-08-01 NOTE — Telephone Encounter (Signed)
Rf req for Hydroco/APAP 10/325 1 po q 6 hr # 100.  Ok to Rf?

## 2011-08-02 NOTE — Telephone Encounter (Signed)
Rx called in to pharmacy. 

## 2011-09-15 ENCOUNTER — Other Ambulatory Visit (INDEPENDENT_AMBULATORY_CARE_PROVIDER_SITE_OTHER): Payer: Medicare Other

## 2011-09-15 ENCOUNTER — Encounter: Payer: Self-pay | Admitting: Internal Medicine

## 2011-09-15 ENCOUNTER — Ambulatory Visit (INDEPENDENT_AMBULATORY_CARE_PROVIDER_SITE_OTHER): Payer: Medicare Other | Admitting: Internal Medicine

## 2011-09-15 VITALS — BP 138/60 | HR 72 | Temp 97.8°F | Resp 16 | Wt 145.0 lb

## 2011-09-15 DIAGNOSIS — I10 Essential (primary) hypertension: Secondary | ICD-10-CM

## 2011-09-15 DIAGNOSIS — R7309 Other abnormal glucose: Secondary | ICD-10-CM

## 2011-09-15 DIAGNOSIS — R0602 Shortness of breath: Secondary | ICD-10-CM

## 2011-09-15 DIAGNOSIS — F341 Dysthymic disorder: Secondary | ICD-10-CM

## 2011-09-15 DIAGNOSIS — R739 Hyperglycemia, unspecified: Secondary | ICD-10-CM

## 2011-09-15 DIAGNOSIS — D472 Monoclonal gammopathy: Secondary | ICD-10-CM

## 2011-09-15 DIAGNOSIS — R109 Unspecified abdominal pain: Secondary | ICD-10-CM

## 2011-09-15 DIAGNOSIS — D649 Anemia, unspecified: Secondary | ICD-10-CM

## 2011-09-15 DIAGNOSIS — Z79899 Other long term (current) drug therapy: Secondary | ICD-10-CM

## 2011-09-15 DIAGNOSIS — N390 Urinary tract infection, site not specified: Secondary | ICD-10-CM

## 2011-09-15 DIAGNOSIS — E785 Hyperlipidemia, unspecified: Secondary | ICD-10-CM

## 2011-09-15 DIAGNOSIS — K589 Irritable bowel syndrome without diarrhea: Secondary | ICD-10-CM

## 2011-09-15 DIAGNOSIS — R030 Elevated blood-pressure reading, without diagnosis of hypertension: Secondary | ICD-10-CM

## 2011-09-15 DIAGNOSIS — M1990 Unspecified osteoarthritis, unspecified site: Secondary | ICD-10-CM

## 2011-09-15 LAB — HEPATIC FUNCTION PANEL
AST: 20 U/L (ref 0–37)
Alkaline Phosphatase: 59 U/L (ref 39–117)
Total Bilirubin: 0.5 mg/dL (ref 0.3–1.2)

## 2011-09-15 LAB — CBC WITH DIFFERENTIAL/PLATELET
Basophils Relative: 0.4 % (ref 0.0–3.0)
Eosinophils Absolute: 0.2 10*3/uL (ref 0.0–0.7)
HCT: 32.4 % — ABNORMAL LOW (ref 36.0–46.0)
Hemoglobin: 11.1 g/dL — ABNORMAL LOW (ref 12.0–15.0)
Lymphocytes Relative: 22.5 % (ref 12.0–46.0)
Lymphs Abs: 1.3 10*3/uL (ref 0.7–4.0)
MCHC: 34.1 g/dL (ref 30.0–36.0)
MCV: 91.9 fl (ref 78.0–100.0)
Monocytes Absolute: 0.5 10*3/uL (ref 0.1–1.0)
Neutro Abs: 3.7 10*3/uL (ref 1.4–7.7)
RBC: 3.52 Mil/uL — ABNORMAL LOW (ref 3.87–5.11)
RDW: 13 % (ref 11.5–14.6)

## 2011-09-15 LAB — VITAMIN B12: Vitamin B-12: 546 pg/mL (ref 211–911)

## 2011-09-15 LAB — URINALYSIS
Hgb urine dipstick: NEGATIVE
Specific Gravity, Urine: 1.02 (ref 1.000–1.030)
Total Protein, Urine: NEGATIVE
Urine Glucose: NEGATIVE

## 2011-09-15 LAB — BASIC METABOLIC PANEL
BUN: 22 mg/dL (ref 6–23)
GFR: 37.22 mL/min — ABNORMAL LOW (ref >60.00)
Potassium: 4.5 mEq/L (ref 3.5–5.1)
Sodium: 137 mEq/L (ref 135–145)

## 2011-09-15 LAB — IBC PANEL
Saturation Ratios: 37.6 % (ref 20.0–50.0)
Transferrin: 284.6 mg/dL (ref 212.0–360.0)

## 2011-09-15 NOTE — Progress Notes (Signed)
  Subjective:    Patient ID: [name] [name], female    DOB: Jun 28, 1933, 78 y.o.   MRN: [id number]  HPI  F/u UTI - took Ceftin  Review of Systems     Objective:   Physical Exam        Assessment & Plan:

## 2011-09-15 NOTE — Assessment & Plan Note (Signed)
1/13 treated w/Ceftin Re-checked UA

## 2011-09-15 NOTE — Assessment & Plan Note (Signed)
Discussed.

## 2011-09-18 ENCOUNTER — Encounter: Payer: Self-pay | Admitting: Internal Medicine

## 2011-09-18 NOTE — Assessment & Plan Note (Signed)
Chronic and intermittent Dr Earlean Shawl Continue with current prescription therapy as reflected on the Med list.

## 2011-09-18 NOTE — Assessment & Plan Note (Signed)
Unclear etiology; aggravated by stress Treat anxiety

## 2011-09-18 NOTE — Assessment & Plan Note (Signed)
BP Readings from Last 3 Encounters:  09/15/11 138/60  06/13/11 140/64  05/11/11 170/80  NAS diet

## 2011-09-19 LAB — PROTEIN ELECTROPHORESIS, SERUM
Alpha-2-Globulin: 8.5 % (ref 7.1–11.8)
Beta Globulin: 5 % (ref 4.7–7.2)
M-Spike, %: 2.16 g/dL
Total Protein, Serum Electrophoresis: 8.8 g/dL — ABNORMAL HIGH (ref 6.0–8.3)

## 2011-11-03 ENCOUNTER — Other Ambulatory Visit: Payer: Self-pay | Admitting: Internal Medicine

## 2011-11-07 ENCOUNTER — Telehealth: Payer: Self-pay | Admitting: *Deleted

## 2011-11-07 MED ORDER — LORAZEPAM 0.5 MG PO TABS: 0.5000 mg | ORAL_TABLET | Freq: Three times a day (TID) | ORAL | Status: DC | PRN

## 2011-11-07 NOTE — Telephone Encounter (Signed)
Ok to fill in PCP absence?  Disp Refills Start End LORazepam (ATIVAN) 0.5 MG tablet [Pharmacy Med Name: LORAZEPAM 0.5MG  TAB] 90 tablet 11/03/2011 Sig: TAKE ONE TABLET 3 TIMES A DAY AS NEEDED. Class: Normal DAW: No

## 2011-11-07 NOTE — Telephone Encounter (Signed)
Pt's spouse informed 

## 2011-11-07 NOTE — Telephone Encounter (Signed)
sure

## 2011-12-05 ENCOUNTER — Other Ambulatory Visit: Payer: Self-pay | Admitting: Internal Medicine

## 2011-12-21 ENCOUNTER — Encounter: Payer: Self-pay | Admitting: Internal Medicine

## 2011-12-21 ENCOUNTER — Ambulatory Visit (INDEPENDENT_AMBULATORY_CARE_PROVIDER_SITE_OTHER): Payer: Medicare Other | Admitting: Internal Medicine

## 2011-12-21 VITALS — BP 168/70 | HR 76 | Temp 97.1°F | Resp 16 | Wt 148.0 lb

## 2011-12-21 DIAGNOSIS — D472 Monoclonal gammopathy: Secondary | ICD-10-CM

## 2011-12-21 DIAGNOSIS — I10 Essential (primary) hypertension: Secondary | ICD-10-CM

## 2011-12-21 DIAGNOSIS — E785 Hyperlipidemia, unspecified: Secondary | ICD-10-CM

## 2011-12-21 DIAGNOSIS — E039 Hypothyroidism, unspecified: Secondary | ICD-10-CM

## 2011-12-21 DIAGNOSIS — E538 Deficiency of other specified B group vitamins: Secondary | ICD-10-CM

## 2011-12-21 DIAGNOSIS — R002 Palpitations: Secondary | ICD-10-CM

## 2011-12-21 DIAGNOSIS — R3915 Urgency of urination: Secondary | ICD-10-CM

## 2011-12-21 MED ORDER — DICLOFENAC SODIUM 1 % TD GEL: 1.0000 "application " | Freq: Four times a day (QID) | TRANSDERMAL | Status: DC

## 2011-12-21 MED ORDER — LORAZEPAM 0.5 MG PO TABS: 0.5000 mg | ORAL_TABLET | Freq: Three times a day (TID) | ORAL | Status: DC | PRN

## 2011-12-21 MED ORDER — GABAPENTIN 100 MG PO CAPS: ORAL_CAPSULE | ORAL | Status: DC

## 2011-12-21 NOTE — Assessment & Plan Note (Signed)
Continue with current prescription therapy as reflected on the Med list.  

## 2011-12-21 NOTE — Assessment & Plan Note (Signed)
Doing well 

## 2011-12-21 NOTE — Progress Notes (Signed)
Patient ID: [name] [name], female   DOB: 07/24/1933, 78 y.o.   MRN: [id number]  Subjective:    Patient ID: [name] [name], female    DOB: 1933-08-22, 78 y.o.   MRN: [id number]  Cough Pertinent negatives include no chills, headaches or rash.     The patient is here to follow up on chronic depression, anxiety, headaches and chronic moderate fibromyalgia symptoms controlled with medicines F/u GERD, fatigue, gammopathy  Wt Readings from Last 3 Encounters:  12/21/11 148 lb (67.132 kg)  09/15/11 145 lb (65.772 kg)  06/13/11 146 lb (66.225 kg)   BP Readings from Last 3 Encounters:  12/21/11 168/70  09/15/11 138/60  06/13/11 140/64     Review of Systems  Constitutional: Positive for fatigue. Negative for chills, activity change, appetite change and unexpected weight change.  HENT: Negative for congestion, mouth sores and sinus pressure.   Eyes: Negative for visual disturbance.  Respiratory: Positive for cough. Negative for chest tightness.   Gastrointestinal: Negative for nausea and abdominal pain.  Genitourinary: Negative for frequency, difficulty urinating and vaginal pain.  Musculoskeletal: Negative for back pain and gait problem.  Skin: Negative for pallor and rash.  Neurological: Negative for dizziness, tremors, weakness, numbness and headaches.  Psychiatric/Behavioral: Negative for confusion and sleep disturbance. The patient is nervous/anxious.        Objective:   Physical Exam  Constitutional: She appears well-developed and well-nourished. No distress.  HENT:  Head: Normocephalic.  Right Ear: External ear normal.  Left Ear: External ear normal.  Nose: Nose normal.  Mouth/Throat: Oropharynx is clear and moist.  Eyes: Conjunctivae are normal. Pupils are equal, round, and reactive to light. Right eye exhibits no discharge. Left eye exhibits no discharge.  Neck: Normal range of motion. Neck supple. No JVD present. No tracheal deviation present. No thyromegaly present.    Cardiovascular: Normal rate, regular rhythm and normal heart sounds.   Pulmonary/Chest: No stridor. No respiratory distress. She has no wheezes.  Abdominal: Soft. Bowel sounds are normal. She exhibits no distension and no mass. There is no tenderness. There is no rebound and no guarding.  Musculoskeletal: She exhibits no edema and no tenderness.  Lymphadenopathy:    She has no cervical adenopathy.  Neurological: She displays normal reflexes. No cranial nerve deficit. She exhibits normal muscle tone. Coordination normal.  Skin: No rash noted. No erythema.  Psychiatric: She has a normal mood and affect. Her behavior is normal. Judgment and thought content normal.    Lab Results  Component Value Date   WBC 5.7 09/15/2011   HGB 11.1* 09/15/2011   HCT 32.4* 09/15/2011   PLT 192.0 09/15/2011   GLUCOSE 97 09/15/2011   CHOL 192 11/20/2006   TRIG 166* 11/20/2006   HDL 41.1 11/20/2006   LDLCALC 118* 11/20/2006   ALT 18 09/15/2011   AST 20 09/15/2011   NA 137 09/15/2011   K 4.5 09/15/2011   CL 100 09/15/2011   CREATININE 1.5* 09/15/2011   BUN 22 09/15/2011   CO2 27 09/15/2011   TSH 0.89 09/15/2011   HGBA1C 5.8 09/15/2011         Assessment & Plan:

## 2012-01-05 ENCOUNTER — Other Ambulatory Visit: Payer: Self-pay | Admitting: Internal Medicine

## 2012-02-14 ENCOUNTER — Other Ambulatory Visit: Payer: Self-pay | Admitting: Internal Medicine

## 2012-03-21 ENCOUNTER — Ambulatory Visit: Payer: Medicare Other | Admitting: Internal Medicine

## 2012-04-19 ENCOUNTER — Ambulatory Visit: Payer: Medicare Other | Admitting: Internal Medicine

## 2012-05-10 ENCOUNTER — Other Ambulatory Visit: Payer: Self-pay | Admitting: Internal Medicine

## 2012-07-16 ENCOUNTER — Telehealth: Payer: Self-pay | Admitting: *Deleted

## 2012-07-16 NOTE — Telephone Encounter (Signed)
OK to fill this prescription with additional refills x2 Thank you!  

## 2012-07-16 NOTE — Telephone Encounter (Signed)
Rf req for Hydrocodone. Last filled 06/05/12. Ok to Rf?

## 2012-07-17 ENCOUNTER — Other Ambulatory Visit: Payer: Self-pay | Admitting: *Deleted

## 2012-07-17 MED ORDER — HYDROCODONE-ACETAMINOPHEN 10-325 MG PO TABS: 1.0000 | ORAL_TABLET | Freq: Four times a day (QID) | ORAL | Status: DC | PRN

## 2012-07-17 NOTE — Telephone Encounter (Signed)
Noted. Thx.

## 2012-07-17 NOTE — Telephone Encounter (Signed)
I called pharmacy and spoke to Sharyn Lull- she states Dr. Dellis Anes Cox just refilled her hydroco/apap 5/325 on 07/13/12 and that we should disregard this request for 10/325.  I did not phone in refill.

## 2012-07-30 ENCOUNTER — Encounter: Payer: Self-pay | Admitting: Internal Medicine

## 2012-09-05 HISTORY — PX: EYE SURGERY: SHX253

## 2012-12-06 ENCOUNTER — Other Ambulatory Visit: Payer: Self-pay | Admitting: Internal Medicine

## 2012-12-06 ENCOUNTER — Other Ambulatory Visit: Payer: Self-pay | Admitting: *Deleted

## 2012-12-06 MED ORDER — HYDROCHLOROTHIAZIDE 25 MG PO TABS: 25.0000 mg | ORAL_TABLET | Freq: Every day | ORAL | Status: DC

## 2012-12-21 ENCOUNTER — Ambulatory Visit (INDEPENDENT_AMBULATORY_CARE_PROVIDER_SITE_OTHER): Payer: Medicare Other | Admitting: Internal Medicine

## 2012-12-21 ENCOUNTER — Encounter: Payer: Self-pay | Admitting: Internal Medicine

## 2012-12-21 ENCOUNTER — Other Ambulatory Visit (INDEPENDENT_AMBULATORY_CARE_PROVIDER_SITE_OTHER): Payer: Medicare Other

## 2012-12-21 VITALS — BP 140/68 | Temp 97.7°F | Wt 156.0 lb

## 2012-12-21 DIAGNOSIS — E538 Deficiency of other specified B group vitamins: Secondary | ICD-10-CM

## 2012-12-21 DIAGNOSIS — D472 Monoclonal gammopathy: Secondary | ICD-10-CM

## 2012-12-21 DIAGNOSIS — E785 Hyperlipidemia, unspecified: Secondary | ICD-10-CM

## 2012-12-21 DIAGNOSIS — E039 Hypothyroidism, unspecified: Secondary | ICD-10-CM

## 2012-12-21 DIAGNOSIS — D649 Anemia, unspecified: Secondary | ICD-10-CM

## 2012-12-21 DIAGNOSIS — I10 Essential (primary) hypertension: Secondary | ICD-10-CM

## 2012-12-21 LAB — HEPATIC FUNCTION PANEL
ALT: 19 U/L (ref 0–35)
AST: 19 U/L (ref 0–37)
Bilirubin, Direct: 0 mg/dL (ref 0.0–0.3)
Total Bilirubin: 0.5 mg/dL (ref 0.3–1.2)

## 2012-12-21 LAB — CBC WITH DIFFERENTIAL/PLATELET
Basophils Absolute: 0 10*3/uL (ref 0.0–0.1)
Eosinophils Relative: 6.6 % — ABNORMAL HIGH (ref 0.0–5.0)
Lymphocytes Relative: 24.6 % (ref 12.0–46.0)
Monocytes Relative: 11 % (ref 3.0–12.0)
Neutrophils Relative %: 57.5 % (ref 43.0–77.0)
Platelets: 240 10*3/uL (ref 150.0–400.0)
WBC: 6.3 10*3/uL (ref 4.5–10.5)

## 2012-12-21 LAB — URINALYSIS
Bilirubin Urine: NEGATIVE
Ketones, ur: NEGATIVE
Nitrite: NEGATIVE
Specific Gravity, Urine: 1.01 (ref 1.000–1.030)
Total Protein, Urine: NEGATIVE
pH: 5.5 (ref 5.0–8.0)

## 2012-12-21 LAB — BASIC METABOLIC PANEL
BUN: 27 mg/dL — ABNORMAL HIGH (ref 6–23)
Calcium: 9.8 mg/dL (ref 8.4–10.5)
Creatinine, Ser: 1.5 mg/dL — ABNORMAL HIGH (ref 0.4–1.2)
GFR: 35.68 mL/min — ABNORMAL LOW (ref >60.00)
Potassium: 5.2 mEq/L — ABNORMAL HIGH (ref 3.5–5.1)

## 2012-12-21 LAB — LIPID PANEL
HDL: 45.2 mg/dL (ref >39.00)
Triglycerides: 178 mg/dL — ABNORMAL HIGH (ref 0.0–149.0)
VLDL: 35.6 mg/dL (ref 0.0–40.0)

## 2012-12-21 MED ORDER — HYDROCHLOROTHIAZIDE 25 MG PO TABS: 25.0000 mg | ORAL_TABLET | Freq: Every day | ORAL | Status: DC

## 2012-12-21 MED ORDER — CYANOCOBALAMIN 1000 MCG/ML IJ SOLN: 1000.0000 ug | INTRAMUSCULAR | Status: DC

## 2012-12-21 MED ORDER — HYDROCODONE-ACETAMINOPHEN 10-325 MG PO TABS: 1.0000 | ORAL_TABLET | Freq: Four times a day (QID) | ORAL | Status: DC | PRN

## 2012-12-21 MED ORDER — OMEPRAZOLE 40 MG PO CPDR: 40.0000 mg | DELAYED_RELEASE_CAPSULE | Freq: Every day | ORAL | Status: DC

## 2012-12-21 MED ORDER — LORATADINE 10 MG PO TABS: 10.0000 mg | ORAL_TABLET | Freq: Every day | ORAL | Status: DC | PRN

## 2012-12-21 MED ORDER — GABAPENTIN 100 MG PO CAPS: ORAL_CAPSULE | ORAL | Status: DC

## 2012-12-21 MED ORDER — LEVOTHYROXINE SODIUM 88 MCG PO TABS: 88.0000 ug | ORAL_TABLET | Freq: Every day | ORAL | Status: DC

## 2012-12-21 NOTE — Assessment & Plan Note (Signed)
Continue with current prescription therapy as reflected on the Med list.  

## 2012-12-21 NOTE — Assessment & Plan Note (Signed)
Chronic Seeing Dr Anabel Bene - Hem-Onc

## 2012-12-21 NOTE — Progress Notes (Signed)
   Subjective:    Patient ID: [name] [name], female    DOB: 03-24-1933, 79 y.o.   MRN: [id number]  Cough Pertinent negatives include no chills, headaches or rash.     The patient is here to follow up on chronic depression, anxiety, headaches and chronic moderate fibromyalgia symptoms controlled with medicines F/u GERD, fatigue, gammopathy  Wt Readings from Last 3 Encounters:  12/21/12 156 lb (70.761 kg)  12/21/11 148 lb (67.132 kg)  09/15/11 145 lb (65.772 kg)   BP Readings from Last 3 Encounters:  12/21/12 140/68  12/21/11 168/70  09/15/11 138/60     Review of Systems  Constitutional: Positive for fatigue. Negative for chills, activity change, appetite change and unexpected weight change.  HENT: Negative for congestion, mouth sores and sinus pressure.   Eyes: Negative for visual disturbance.  Respiratory: Positive for cough. Negative for chest tightness.   Gastrointestinal: Negative for nausea and abdominal pain.  Genitourinary: Negative for frequency, difficulty urinating and vaginal pain.  Musculoskeletal: Negative for back pain and gait problem.  Skin: Negative for pallor and rash.  Neurological: Negative for dizziness, tremors, weakness, numbness and headaches.  Psychiatric/Behavioral: Negative for confusion and sleep disturbance. The patient is nervous/anxious.        Objective:   Physical Exam  Constitutional: She appears well-developed and well-nourished. No distress.  HENT:  Head: Normocephalic.  Right Ear: External ear normal.  Left Ear: External ear normal.  Nose: Nose normal.  Mouth/Throat: Oropharynx is clear and moist.  Eyes: Conjunctivae are normal. Pupils are equal, round, and reactive to light. Right eye exhibits no discharge. Left eye exhibits no discharge.  Neck: Normal range of motion. Neck supple. No JVD present. No tracheal deviation present. No thyromegaly present.  Cardiovascular: Normal rate, regular rhythm and normal heart sounds.    Pulmonary/Chest: No stridor. No respiratory distress. She has no wheezes.  Abdominal: Soft. Bowel sounds are normal. She exhibits no distension and no mass. There is no tenderness. There is no rebound and no guarding.  Musculoskeletal: She exhibits no edema and no tenderness.  Lymphadenopathy:    She has no cervical adenopathy.  Neurological: She displays normal reflexes. No cranial nerve deficit. She exhibits normal muscle tone. Coordination normal.  Skin: No rash noted. No erythema.  Psychiatric: She has a normal mood and affect. Her behavior is normal. Judgment and thought content normal.    Lab Results  Component Value Date   WBC 5.7 09/15/2011   HGB 11.1* 09/15/2011   HCT 32.4* 09/15/2011   PLT 192.0 09/15/2011   GLUCOSE 97 09/15/2011   CHOL 192 11/20/2006   TRIG 166* 11/20/2006   HDL 41.1 11/20/2006   LDLCALC 118* 11/20/2006   ALT 18 09/15/2011   AST 20 09/15/2011   NA 137 09/15/2011   K 4.5 09/15/2011   CL 100 09/15/2011   CREATININE 1.5* 09/15/2011   BUN 22 09/15/2011   CO2 27 09/15/2011   TSH 0.89 09/15/2011   HGBA1C 5.8 09/15/2011         Assessment & Plan:

## 2012-12-21 NOTE — Assessment & Plan Note (Signed)
Watching  

## 2012-12-26 ENCOUNTER — Telehealth: Payer: Self-pay | Admitting: Internal Medicine

## 2012-12-26 NOTE — Telephone Encounter (Signed)
Pt called stating that she have been getting b12 (self inj) from her drug store, but the drug store does not have it right now. Pt stated that Dr. Camila Li mention about pill over the counter for b12  and pt would like to know the name of it.

## 2012-12-27 NOTE — Telephone Encounter (Signed)
Left detailed mess informing pt of name and to ask the pharmacist for assistance.

## 2013-01-03 DIAGNOSIS — I639 Cerebral infarction, unspecified: Secondary | ICD-10-CM

## 2013-01-03 DIAGNOSIS — J189 Pneumonia, unspecified organism: Secondary | ICD-10-CM

## 2013-01-03 HISTORY — DX: Cerebral infarction, unspecified: I63.9

## 2013-01-03 HISTORY — DX: Pneumonia, unspecified organism: J18.9

## 2013-01-07 ENCOUNTER — Inpatient Hospital Stay (HOSPITAL_COMMUNITY)
Admission: AD | Admit: 2013-01-07 | Discharge: 2013-01-23 | DRG: 438 | Disposition: A | Discharge status: Skilled Nursing Facility | Payer: Medicare Other | Source: Other Acute Inpatient Hospital | Attending: Internal Medicine | Admitting: Internal Medicine

## 2013-01-07 ENCOUNTER — Inpatient Hospital Stay (HOSPITAL_COMMUNITY): Payer: Medicare Other

## 2013-01-07 DIAGNOSIS — K8050 Calculus of bile duct without cholangitis or cholecystitis without obstruction: Secondary | ICD-10-CM | POA: Diagnosis present

## 2013-01-07 DIAGNOSIS — R29898 Other symptoms and signs involving the musculoskeletal system: Secondary | ICD-10-CM | POA: Diagnosis present

## 2013-01-07 DIAGNOSIS — R002 Palpitations: Secondary | ICD-10-CM

## 2013-01-07 DIAGNOSIS — K219 Gastro-esophageal reflux disease without esophagitis: Secondary | ICD-10-CM

## 2013-01-07 DIAGNOSIS — R1310 Dysphagia, unspecified: Secondary | ICD-10-CM

## 2013-01-07 DIAGNOSIS — I6340 Cerebral infarction due to embolism of unspecified cerebral artery: Secondary | ICD-10-CM | POA: Diagnosis present

## 2013-01-07 DIAGNOSIS — I422 Other hypertrophic cardiomyopathy: Secondary | ICD-10-CM

## 2013-01-07 DIAGNOSIS — R109 Unspecified abdominal pain: Secondary | ICD-10-CM

## 2013-01-07 DIAGNOSIS — D3709 Neoplasm of uncertain behavior of other specified sites of the oral cavity: Secondary | ICD-10-CM

## 2013-01-07 DIAGNOSIS — R0602 Shortness of breath: Secondary | ICD-10-CM

## 2013-01-07 DIAGNOSIS — R609 Edema, unspecified: Secondary | ICD-10-CM

## 2013-01-07 DIAGNOSIS — K589 Irritable bowel syndrome without diarrhea: Secondary | ICD-10-CM

## 2013-01-07 DIAGNOSIS — I129 Hypertensive chronic kidney disease with stage 1 through stage 4 chronic kidney disease, or unspecified chronic kidney disease: Secondary | ICD-10-CM | POA: Diagnosis present

## 2013-01-07 DIAGNOSIS — E872 Acidosis, unspecified: Secondary | ICD-10-CM | POA: Diagnosis present

## 2013-01-07 DIAGNOSIS — R509 Fever, unspecified: Secondary | ICD-10-CM | POA: Diagnosis not present

## 2013-01-07 DIAGNOSIS — J189 Pneumonia, unspecified organism: Secondary | ICD-10-CM

## 2013-01-07 DIAGNOSIS — R29810 Facial weakness: Secondary | ICD-10-CM | POA: Diagnosis present

## 2013-01-07 DIAGNOSIS — G9349 Other encephalopathy: Secondary | ICD-10-CM | POA: Diagnosis present

## 2013-01-07 DIAGNOSIS — R55 Syncope and collapse: Secondary | ICD-10-CM

## 2013-01-07 DIAGNOSIS — N644 Mastodynia: Secondary | ICD-10-CM

## 2013-01-07 DIAGNOSIS — K8020 Calculus of gallbladder without cholecystitis without obstruction: Secondary | ICD-10-CM | POA: Diagnosis present

## 2013-01-07 DIAGNOSIS — D3701 Neoplasm of uncertain behavior of lip: Secondary | ICD-10-CM

## 2013-01-07 DIAGNOSIS — R198 Other specified symptoms and signs involving the digestive system and abdomen: Secondary | ICD-10-CM

## 2013-01-07 DIAGNOSIS — I10 Essential (primary) hypertension: Secondary | ICD-10-CM

## 2013-01-07 DIAGNOSIS — IMO0002 Reserved for concepts with insufficient information to code with codable children: Secondary | ICD-10-CM

## 2013-01-07 DIAGNOSIS — F341 Dysthymic disorder: Secondary | ICD-10-CM

## 2013-01-07 DIAGNOSIS — R4789 Other speech disturbances: Secondary | ICD-10-CM | POA: Diagnosis not present

## 2013-01-07 DIAGNOSIS — N390 Urinary tract infection, site not specified: Secondary | ICD-10-CM

## 2013-01-07 DIAGNOSIS — E785 Hyperlipidemia, unspecified: Secondary | ICD-10-CM

## 2013-01-07 DIAGNOSIS — N183 Chronic kidney disease, stage 3 unspecified: Secondary | ICD-10-CM | POA: Diagnosis present

## 2013-01-07 DIAGNOSIS — I872 Venous insufficiency (chronic) (peripheral): Secondary | ICD-10-CM

## 2013-01-07 DIAGNOSIS — K859 Acute pancreatitis without necrosis or infection, unspecified: Principal | ICD-10-CM

## 2013-01-07 DIAGNOSIS — R3915 Urgency of urination: Secondary | ICD-10-CM

## 2013-01-07 DIAGNOSIS — J9600 Acute respiratory failure, unspecified whether with hypoxia or hypercapnia: Secondary | ICD-10-CM

## 2013-01-07 DIAGNOSIS — G9340 Encephalopathy, unspecified: Secondary | ICD-10-CM

## 2013-01-07 DIAGNOSIS — D472 Monoclonal gammopathy: Secondary | ICD-10-CM

## 2013-01-07 DIAGNOSIS — E876 Hypokalemia: Secondary | ICD-10-CM | POA: Diagnosis not present

## 2013-01-07 DIAGNOSIS — R21 Rash and other nonspecific skin eruption: Secondary | ICD-10-CM

## 2013-01-07 DIAGNOSIS — F411 Generalized anxiety disorder: Secondary | ICD-10-CM

## 2013-01-07 DIAGNOSIS — E039 Hypothyroidism, unspecified: Secondary | ICD-10-CM

## 2013-01-07 DIAGNOSIS — E538 Deficiency of other specified B group vitamins: Secondary | ICD-10-CM

## 2013-01-07 DIAGNOSIS — I639 Cerebral infarction, unspecified: Secondary | ICD-10-CM

## 2013-01-07 DIAGNOSIS — M899 Disorder of bone, unspecified: Secondary | ICD-10-CM

## 2013-01-07 DIAGNOSIS — N179 Acute kidney failure, unspecified: Secondary | ICD-10-CM

## 2013-01-07 DIAGNOSIS — R030 Elevated blood-pressure reading, without diagnosis of hypertension: Secondary | ICD-10-CM

## 2013-01-07 DIAGNOSIS — I519 Heart disease, unspecified: Secondary | ICD-10-CM | POA: Diagnosis present

## 2013-01-07 DIAGNOSIS — M1990 Unspecified osteoarthritis, unspecified site: Secondary | ICD-10-CM

## 2013-01-07 DIAGNOSIS — F29 Unspecified psychosis not due to a substance or known physiological condition: Secondary | ICD-10-CM | POA: Diagnosis present

## 2013-01-07 DIAGNOSIS — R6510 Systemic inflammatory response syndrome (SIRS) of non-infectious origin without acute organ dysfunction: Secondary | ICD-10-CM | POA: Diagnosis present

## 2013-01-07 DIAGNOSIS — D649 Anemia, unspecified: Secondary | ICD-10-CM

## 2013-01-07 DIAGNOSIS — D696 Thrombocytopenia, unspecified: Secondary | ICD-10-CM | POA: Diagnosis present

## 2013-01-07 LAB — URINE MICROSCOPIC-ADD ON

## 2013-01-07 LAB — POCT I-STAT 3, ART BLOOD GAS (G3+)
Bicarbonate: 15 mEq/L — ABNORMAL LOW (ref 20.0–24.0)
O2 Saturation: 92 %
TCO2: 16 mmol/L (ref 0–100)
pCO2 arterial: 29.4 mmHg — ABNORMAL LOW (ref 35.0–45.0)

## 2013-01-07 LAB — MRSA PCR SCREENING: MRSA by PCR: NEGATIVE

## 2013-01-07 LAB — URINALYSIS, ROUTINE W REFLEX MICROSCOPIC
Glucose, UA: NEGATIVE mg/dL
Specific Gravity, Urine: 1.028 (ref 1.005–1.030)
Urobilinogen, UA: 1 mg/dL (ref 0.0–1.0)

## 2013-01-07 MED ORDER — SODIUM CHLORIDE 0.9 % IJ SOLN
3.0000 mL | Freq: Two times a day (BID) | INTRAMUSCULAR | Status: DC
  Administered 2013-01-07: 3 mL via INTRAVENOUS

## 2013-01-07 MED ORDER — HEPARIN SODIUM (PORCINE) 5000 UNIT/ML IJ SOLN
5000.0000 [IU] | Freq: Three times a day (TID) | INTRAMUSCULAR | Status: DC
  Administered 2013-01-08: 5000 [IU] via SUBCUTANEOUS
  Filled 2013-01-07 (×2): qty 1

## 2013-01-07 MED ORDER — ALBUTEROL SULFATE (5 MG/ML) 0.5% IN NEBU: 2.5000 mg | INHALATION_SOLUTION | RESPIRATORY_TRACT | Status: DC | PRN

## 2013-01-07 MED ORDER — PANTOPRAZOLE SODIUM 40 MG IV SOLR
40.0000 mg | Freq: Every day | INTRAVENOUS | Status: DC
  Administered 2013-01-07 – 2013-01-13 (×7): 40 mg via INTRAVENOUS
  Filled 2013-01-07 (×10): qty 40

## 2013-01-07 MED ORDER — SODIUM CHLORIDE 0.9 % IV SOLN: 250.0000 mL | INTRAVENOUS | Status: DC | PRN

## 2013-01-07 MED ORDER — HYDROMORPHONE HCL PF 1 MG/ML IJ SOLN
0.5000 mg | INTRAMUSCULAR | Status: DC | PRN
  Administered 2013-01-07 – 2013-01-08 (×2): 0.5 mg via INTRAVENOUS
  Filled 2013-01-07 (×3): qty 1

## 2013-01-07 MED ORDER — ASPIRIN 300 MG RE SUPP
300.0000 mg | RECTAL | Status: DC
  Filled 2013-01-07: qty 1

## 2013-01-07 MED ORDER — SODIUM CHLORIDE 0.9 % IV SOLN
INTRAVENOUS | Status: DC
  Administered 2013-01-08: 1000 mL via INTRAVENOUS

## 2013-01-07 MED ORDER — SODIUM CHLORIDE 0.9 % IJ SOLN: 3.0000 mL | INTRAMUSCULAR | Status: DC | PRN

## 2013-01-07 MED ORDER — SODIUM CHLORIDE 0.9 % IV BOLUS (SEPSIS)
1000.0000 mL | Freq: Once | INTRAVENOUS | Status: AC
  Administered 2013-01-07: 1000 mL via INTRAVENOUS

## 2013-01-07 MED ORDER — ASPIRIN 81 MG PO CHEW: 324.0000 mg | CHEWABLE_TABLET | ORAL | Status: DC

## 2013-01-07 MED ORDER — ALBUTEROL SULFATE HFA 108 (90 BASE) MCG/ACT IN AERS
4.0000 | INHALATION_SPRAY | RESPIRATORY_TRACT | Status: DC | PRN
  Filled 2013-01-07: qty 6.7

## 2013-01-07 NOTE — H&P (Signed)
PULMONARY  / CRITICAL CARE MEDICINE  Name: [name] [name] MRN: [id number] DOB: June 12, 1933    ADMISSION DATE:  01/07/2013 CONSULTATION DATE:  01/07/2013  REFERRING MD :  Transfer from [place]:  PCCM  CHIEF COMPLAINT:  Acute encephalopathy  BRIEF PATIENT DESCRIPTION: 79 yo without significant medical history admitted to [facility] with acute pancreatitis.  Imaging demonstrated dilated CBD and gallbladder stone.  Course was complicated by oliguria and acute encephalopathy.   SIGNIFICANT EVENTS / STUDIES:  5/4  Admitted to [place] with acute pancreatitis 5/4  Abdominal US >>>  Gallbladder stones, CBD 4 mm 5/4  Abdomen CT >>>  Diffuse acute pancreatitis, CBD mildly dilated, no mass, dilated gallbladder with gallstones, no hydronephrosis 5/5  Transferred to [place] / TUBES:  CULTURES: 5/5  Blood >>>  ANTIBIOTICS:  The patient is encephalopathic and unable to provide history, which was obtained for available medical records.  HISTORY OF PRESENT ILLNESS:  79 yo without significant medical history admitted to [facility] with acute pancreatitis.  Imaging demonstrated dilated CBD and gallbladder stone.  Course was complicated by oliguria and acute encephalopathy.   PAST MEDICAL HISTORY :  Past Medical History  Diagnosis Date  . Hypertension   . Hyperlipidemia   . Palpitations   . Thyroid disease   . Weakness generalized   . Anemia   . Other urinary problems   . Lightheadedness   . Dyspnea     w/ atypical upper airway symptoms? all vcd/lpr/gerd  . Breast pain   . Vasovagal syncope   . Cellulitis and abscess of unspecified digit   . Urinary tract infection, site not specified   . Urgency of urination   . Other symptoms involving abdomen and pelvis   . Rash and other nonspecific skin eruption   . Unspecified venous (peripheral) insufficiency   . Edema   . Osteoarthrosis, unspecified whether generalized or localized, unspecified site   .  Monoclonal paraproteinemia   . Irritable bowel syndrome   . Other B-complex deficiencies   . Unspecified hypothyroidism   . GERD (gastroesophageal reflux disease)   . Anxiety   . Torus palatinus    Past Surgical History  Procedure Laterality Date  . Back surgery    . Shoulder surgery    . Cholecystectomy    . Abdominal hysterectomy    . Vaginosacropexy    . Suprapubic bladder suspension     Prior to Admission medications   Medication Sig Start Date End Date Taking? Authorizing Provider  aspirin 81 MG tablet Take 81 mg by mouth daily.      Historical Provider, MD  cholecalciferol (VITAMIN D) 1000 UNITS tablet Take 1,000 Units by mouth daily.      Historical Provider, MD  cyanocobalamin (,VITAMIN B-12,) 1000 MCG/ML injection Inject 1 mL (1,000 mcg total) into the skin every 14 (fourteen) days. 12/21/12   Evie Lacks Plotnikov, MD  diclofenac sodium (VOLTAREN) 1 % GEL Apply 1 application topically 4 (four) times daily. 12/21/11   Evie Lacks Plotnikov, MD  Fluticasone-Salmeterol (ADVAIR DISKUS) 250-50 MCG/DOSE AEPB Inhale 1 puff into the lungs 2 (two) times daily. 05/11/11 12/21/12  Evie Lacks Plotnikov, MD  folic acid (FOLVITE) Q000111Q MCG tablet Take 400 mcg by mouth daily.      Historical Provider, MD  gabapentin (NEURONTIN) 100 MG capsule 1-2 at hs prn restless legs 12/21/12   Cassandria Anger, MD  hydrochlorothiazide (HYDRODIURIL) 25 MG tablet Take 1 tablet (25 mg total) by mouth daily.  12/21/12   Cassandria Anger, MD  HYDROcodone-acetaminophen (NORCO) 10-325 MG per tablet Take 1 tablet by mouth every 6 (six) hours as needed. 12/21/12   Cassandria Anger, MD  levothyroxine (SYNTHROID, LEVOTHROID) 88 MCG tablet Take 1 tablet (88 mcg total) by mouth daily before breakfast. 12/21/12   Cassandria Anger, MD  loratadine (CLARITIN) 10 MG tablet Take 1 tablet (10 mg total) by mouth daily as needed. 12/21/12   Evie Lacks Plotnikov, MD  omeprazole (PRILOSEC) 40 MG capsule Take 1 capsule (40 mg total) by  mouth daily. 12/21/12   Cassandria Anger, MD   Allergies  Allergen Reactions  . Amlodipine Besylate     REACTION: edema  . Cefuroxime Axetil     REACTION: nausea  . Nitrofurantoin     REACTION: nausea  . Sulfadiazine    FAMILY HISTORY:  Family History  Problem Relation Age of Onset  . Hypertension Other   . Hypertension Father    SOCIAL HISTORY:  reports that she has never smoked. She does not have any smokeless tobacco history on file. She reports that she does not drink alcohol or use illicit drugs.  REVIEW OF SYSTEMS:  Unable to provide.  INTERVAL HISTORY:  VITAL SIGNS: Pulse Rate:  [112] 112 (05/05 2200) Resp:  [34] 34 (05/05 2200) BP: (93)/(58) 93/58 mmHg (05/05 2200) SpO2:  [94 %] 94 % (05/05 2200) HEMODYNAMICS:   VENTILATOR SETTINGS:   INTAKE / OUTPUT: Intake/Output   None     PHYSICAL EXAMINATION: General:  Appears in pain, agitated Neuro:  Encephalopathic, nonfocal, cough / gag present HEENT:  PERRL, dry membraanes Cardiovascular:  Tachycardic, regular Lungs:  Bilateral diminished air entry, no w/r/r Abdomen:  Tender top palpation, especially in epigastrium, guarding, no rebound tenderness, bowel sounds diminished Musculoskeletal:  Moves all extremities, no edema Skin:  Intact  LABS:  Recent Labs Lab 01/07/13 0210 01/07/13 2219 01/07/13 2240  HGB 10.7*  --   --   WBC 20.9*  --   --   PLT 119*  --   --   NA 138  --   --   K 4.5  --   --   CL 107  --   --   CO2 18*  --   --   GLUCOSE 92  --   --   BUN 66*  --   --   CREATININE 3.17*  --   --   CALCIUM 7.3*  --   --   MG 1.7  --   --   PHOS 4.7*  --   --   AST 219*  --   --   ALT 130*  --   --   ALKPHOS 59  --   --   BILITOT 0.6  --   --   PROT 5.9*  --   --   ALBUMIN 1.9*  --   --   APTT 32  --   --   INR 1.50*  --   --   LATICACIDVEN 2.2  --   --   TROPONINI  --   --  <0.30  PHART  --  7.317*  --   PCO2ART  --  29.4*  --   PO2ART  --  69.0*  --    No results found for this  basename: GLUCAP,  in the last 168 hours  CXR: 05/04 >>> Stable chronic LLL and RUL densities  ASSESSMENT / PLAN:  PULMONARY A:  Recently treated for pneumonia.  At risk for hypoxemic (fluid overload) and hypercarbic (opioids) respiratory failure. P:   Gaol SpO2>92 Supplemental oxygen Albuterol PRN  CARDIOVASCULAR A: SIRS. P:  Goal MAP>60 Trend troponin / lactate  RENAL A:  Acute renal failure / AKI. Oliguria.  Hypovolemia. P:   Renal consult in AM (seen by Dr. Hassell Done at [place]) Trend BMP NS 1000 x 1 NS@150   GASTROINTESTINAL A:  Cholelithiasis.  Acute pancreatitis (likely gallstone). P:   NPO Protonix is GI Px May need cholecystectomy in the near future, no indications for emergent procedure Call GI in AM, re possible ERSP  HEMATOLOGIC A:  Thrombocytopenia.  Coagulopathy. P:  Trend CBC SCDs for DVT Px  INFECTIOUS A:  No evidence of pancreatic abscess. P:   Defer antibiotcs  ENDOCRINE  A:  Hypothyroidism.  P:   Synthroid PO when able  NEUROLOGIC A:  Pain.  Acute encephalopathy. P:   Dilaudid PRN  TODAY'S SUMMARY: 79 yo without significant medical history admitted to [facility] with acute pancreatitis.  Imaging demonstrated dilated CBD and gallbladder stone.  Course was complicated by oliguria and acute encephalopathy.  Tonight: supportive measures, IVF, pain control.  Notify Renal of transfer in AM.  Consult GI in AM for possible ERCP.  At risk for acute respiratory failure / intubation due to fluid overload / hypercarbia with pain rx.  I have personally obtained a history, examined the patient, evaluated laboratory and imaging results, formulated the assessment and plan and placed orders.  CRITICAL CARE:  The patient is critically ill with multiple organ systems failure and requires high complexity decision making for assessment and support, frequent evaluation and titration of therapies, application of advanced monitoring technologies and extensive  interpretation of multiple databases. Critical Care Time devoted to patient care services described in this note is 35 minutes.   Doree Fudge, MD Pulmonary and [place] Pager: [phone]  01/07/2013, 10:50 PM

## 2013-01-08 ENCOUNTER — Encounter (HOSPITAL_COMMUNITY): Payer: Self-pay | Admitting: Radiology

## 2013-01-08 ENCOUNTER — Inpatient Hospital Stay (HOSPITAL_COMMUNITY): Payer: Medicare Other

## 2013-01-08 ENCOUNTER — Ambulatory Visit: Payer: Medicare Other | Admitting: Internal Medicine

## 2013-01-08 DIAGNOSIS — K859 Acute pancreatitis without necrosis or infection, unspecified: Principal | ICD-10-CM

## 2013-01-08 DIAGNOSIS — N179 Acute kidney failure, unspecified: Secondary | ICD-10-CM

## 2013-01-08 DIAGNOSIS — I639 Cerebral infarction, unspecified: Secondary | ICD-10-CM

## 2013-01-08 DIAGNOSIS — G9340 Encephalopathy, unspecified: Secondary | ICD-10-CM

## 2013-01-08 DIAGNOSIS — J9600 Acute respiratory failure, unspecified whether with hypoxia or hypercapnia: Secondary | ICD-10-CM

## 2013-01-08 DIAGNOSIS — D649 Anemia, unspecified: Secondary | ICD-10-CM

## 2013-01-08 DIAGNOSIS — R109 Unspecified abdominal pain: Secondary | ICD-10-CM

## 2013-01-08 LAB — HEPATIC FUNCTION PANEL
ALT: 188 U/L — ABNORMAL HIGH (ref 0–35)
AST: 355 U/L — ABNORMAL HIGH (ref 0–37)
Albumin: 1.9 g/dL — ABNORMAL LOW (ref 3.5–5.2)
Alkaline Phosphatase: 62 U/L (ref 39–117)
Bilirubin, Direct: 0.2 mg/dL (ref 0.0–0.3)
Indirect Bilirubin: 0.4 mg/dL (ref 0.3–0.9)
Total Bilirubin: 0.6 mg/dL (ref 0.3–1.2)
Total Protein: 5.7 g/dL — ABNORMAL LOW (ref 6.0–8.3)

## 2013-01-08 LAB — BASIC METABOLIC PANEL
BUN: 69 mg/dL — ABNORMAL HIGH (ref 6–23)
BUN: 64 mg/dL — ABNORMAL HIGH (ref 6–23)
CO2: 19 mEq/L (ref 19–32)
Calcium: 7.2 mg/dL — ABNORMAL LOW (ref 8.4–10.5)
Chloride: 109 mEq/L (ref 96–112)
Creatinine, Ser: 2.55 mg/dL — ABNORMAL HIGH (ref 0.50–1.10)
GFR calc non Af Amer: 13 mL/min — ABNORMAL LOW (ref >90)
Glucose, Bld: 94 mg/dL (ref 70–99)
Sodium: 139 mEq/L (ref 135–145)

## 2013-01-08 LAB — CBC
Hemoglobin: 10.3 g/dL — ABNORMAL LOW (ref 12.0–15.0)
MCH: 30.3 pg (ref 26.0–34.0)
MCHC: 34.8 g/dL (ref 30.0–36.0)

## 2013-01-08 LAB — POCT I-STAT 3, ART BLOOD GAS (G3+)
Acid-base deficit: 10 mmol/L — ABNORMAL HIGH (ref 0.0–2.0)
Bicarbonate: 14.9 mEq/L — ABNORMAL LOW (ref 20.0–24.0)
Patient temperature: 98.6

## 2013-01-08 LAB — PHOSPHORUS: Phosphorus: 4.7 mg/dL — ABNORMAL HIGH (ref 2.3–4.6)

## 2013-01-08 LAB — COMPREHENSIVE METABOLIC PANEL
ALT: 130 U/L — ABNORMAL HIGH (ref 0–35)
AST: 219 U/L — ABNORMAL HIGH (ref 0–37)
Calcium: 7.3 mg/dL — ABNORMAL LOW (ref 8.4–10.5)
GFR calc Af Amer: 15 mL/min — ABNORMAL LOW (ref >90)
Sodium: 138 mEq/L (ref 135–145)
Total Protein: 5.9 g/dL — ABNORMAL LOW (ref 6.0–8.3)

## 2013-01-08 LAB — TROPONIN I: Troponin I: <0.3 ng/mL (ref <0.30)

## 2013-01-08 LAB — CBC WITH DIFFERENTIAL/PLATELET
Basophils Absolute: 0 10*3/uL (ref 0.0–0.1)
Eosinophils Absolute: 0 10*3/uL (ref 0.0–0.7)
Eosinophils Relative: 0 % (ref 0–5)
MCH: 30.7 pg (ref 26.0–34.0)
MCHC: 35.1 g/dL (ref 30.0–36.0)
MCV: 87.4 fL (ref 78.0–100.0)
Platelets: 119 10*3/uL — ABNORMAL LOW (ref 150–400)
RDW: 14.5 % (ref 11.5–15.5)
WBC: 20.9 10*3/uL — ABNORMAL HIGH (ref 4.0–10.5)

## 2013-01-08 LAB — BLOOD GAS, ARTERIAL
Acid-base deficit: 7.5 mmol/L — ABNORMAL HIGH (ref 0.0–2.0)
Bicarbonate: 18.2 mEq/L — ABNORMAL LOW (ref 20.0–24.0)
FIO2: 0.5 %
O2 Saturation: 94.9 %
pCO2 arterial: 41.7 mmHg (ref 35.0–45.0)
pO2, Arterial: 86.2 mmHg (ref 80.0–100.0)

## 2013-01-08 LAB — MAGNESIUM: Magnesium: 1.7 mg/dL (ref 1.5–2.5)

## 2013-01-08 LAB — LIPID PANEL
Cholesterol: 110 mg/dL (ref 0–200)
Triglycerides: 102 mg/dL (ref <150)

## 2013-01-08 LAB — GLUCOSE, CAPILLARY
Glucose-Capillary: 103 mg/dL — ABNORMAL HIGH (ref 70–99)
Glucose-Capillary: 86 mg/dL (ref 70–99)

## 2013-01-08 LAB — PROTIME-INR: Prothrombin Time: 17.7 seconds — ABNORMAL HIGH (ref 11.6–15.2)

## 2013-01-08 LAB — LIPASE, BLOOD
Lipase: 1434 U/L — ABNORMAL HIGH (ref 11–59)
Lipase: 720 U/L — ABNORMAL HIGH (ref 11–59)

## 2013-01-08 LAB — HEMOGLOBIN A1C: Hgb A1c MFr Bld: 5.7 % — ABNORMAL HIGH (ref <5.7)

## 2013-01-08 MED ORDER — FENTANYL BOLUS VIA INFUSION: 25.0000 ug | Freq: Four times a day (QID) | INTRAVENOUS | Status: DC | PRN

## 2013-01-08 MED ORDER — FENTANYL CITRATE 0.05 MG/ML IJ SOLN
50.0000 ug | INTRAMUSCULAR | Status: DC | PRN
  Administered 2013-01-08: 75 ug via INTRAVENOUS
  Administered 2013-01-08 (×2): 100 ug via INTRAVENOUS
  Filled 2013-01-08 (×3): qty 2

## 2013-01-08 MED ORDER — SODIUM BICARBONATE 8.4 % IV SOLN
INTRAVENOUS | Status: DC
  Administered 2013-01-08 – 2013-01-09 (×3): via INTRAVENOUS
  Filled 2013-01-08 (×4): qty 100

## 2013-01-08 MED ORDER — SODIUM CHLORIDE 0.9 % IV SOLN: 1.0000 mg/h | INTRAVENOUS | Status: DC

## 2013-01-08 MED ORDER — BIOTENE DRY MOUTH MT LIQD
15.0000 mL | Freq: Two times a day (BID) | OROMUCOSAL | Status: DC
  Administered 2013-01-08 (×2): 15 mL via OROMUCOSAL

## 2013-01-08 MED ORDER — ASPIRIN 300 MG RE SUPP
300.0000 mg | Freq: Every day | RECTAL | Status: DC
  Administered 2013-01-08: 300 mg via RECTAL
  Filled 2013-01-08 (×2): qty 1

## 2013-01-08 MED ORDER — BIOTENE DRY MOUTH MT LIQD
15.0000 mL | Freq: Four times a day (QID) | OROMUCOSAL | Status: DC
  Administered 2013-01-09 – 2013-01-23 (×52): 15 mL via OROMUCOSAL

## 2013-01-08 MED ORDER — SODIUM CHLORIDE 0.9 % IV SOLN: 25.0000 ug/h | INTRAVENOUS | Status: DC

## 2013-01-08 MED ORDER — ETOMIDATE 2 MG/ML IV SOLN
INTRAVENOUS | Status: AC
  Administered 2013-01-08: 10 mg
  Filled 2013-01-08: qty 10

## 2013-01-08 MED ORDER — MIDAZOLAM HCL 5 MG/ML IJ SOLN
1.0000 mg/h | INTRAMUSCULAR | Status: DC
  Filled 2013-01-08: qty 10

## 2013-01-08 MED ORDER — SODIUM CHLORIDE 0.9 % IV SOLN
25.0000 ug/h | INTRAVENOUS | Status: DC
  Administered 2013-01-08: 100 ug/h via INTRAVENOUS
  Administered 2013-01-09: 200 ug/h via INTRAVENOUS
  Administered 2013-01-09: 75 ug/h via INTRAVENOUS
  Filled 2013-01-08 (×4): qty 50

## 2013-01-08 MED ORDER — FENTANYL CITRATE 0.05 MG/ML IJ SOLN: 50.0000 ug | INTRAMUSCULAR | Status: DC | PRN

## 2013-01-08 MED ORDER — MIDAZOLAM BOLUS VIA INFUSION
1.0000 mg | INTRAVENOUS | Status: DC | PRN
  Filled 2013-01-08: qty 2

## 2013-01-08 MED ORDER — FENTANYL CITRATE 0.05 MG/ML IJ SOLN
INTRAMUSCULAR | Status: AC
  Administered 2013-01-08: 100 ug via INTRAVENOUS
  Filled 2013-01-08: qty 2

## 2013-01-08 MED ORDER — FENTANYL CITRATE 0.05 MG/ML IJ SOLN
25.0000 ug | INTRAMUSCULAR | Status: DC | PRN
  Administered 2013-01-08 (×2): 25 ug via INTRAVENOUS
  Filled 2013-01-08 (×2): qty 2

## 2013-01-08 MED ORDER — FENTANYL CITRATE 0.05 MG/ML IJ SOLN
INTRAMUSCULAR | Status: AC
  Administered 2013-01-08: 200 ug
  Filled 2013-01-08: qty 4

## 2013-01-08 MED ORDER — VECURONIUM BROMIDE 10 MG IV SOLR
INTRAVENOUS | Status: AC
  Administered 2013-01-08: 7 mg
  Filled 2013-01-08: qty 10

## 2013-01-08 MED ORDER — MIDAZOLAM HCL 2 MG/2ML IJ SOLN
INTRAMUSCULAR | Status: AC
  Administered 2013-01-08: 4 mg
  Filled 2013-01-08: qty 4

## 2013-01-08 MED ORDER — FENTANYL BOLUS VIA INFUSION
25.0000 ug | Freq: Four times a day (QID) | INTRAVENOUS | Status: DC | PRN
  Filled 2013-01-08: qty 100

## 2013-01-08 MED ORDER — MIDAZOLAM BOLUS VIA INFUSION: 1.0000 mg | INTRAVENOUS | Status: DC | PRN

## 2013-01-08 MED ORDER — PROPOFOL 10 MG/ML IV EMUL
INTRAVENOUS | Status: AC
  Filled 2013-01-08: qty 100

## 2013-01-08 MED ORDER — MIDAZOLAM HCL 2 MG/2ML IJ SOLN
INTRAMUSCULAR | Status: AC
  Filled 2013-01-08: qty 2

## 2013-01-08 MED ORDER — CHLORHEXIDINE GLUCONATE 0.12 % MT SOLN
15.0000 mL | Freq: Two times a day (BID) | OROMUCOSAL | Status: DC
  Administered 2013-01-08 (×2): 15 mL via OROMUCOSAL
  Filled 2013-01-08 (×2): qty 15

## 2013-01-08 MED ORDER — SODIUM CHLORIDE 0.9 % IV SOLN
1.0000 mg/h | INTRAVENOUS | Status: DC
  Administered 2013-01-08: 2 mg/h via INTRAVENOUS
  Filled 2013-01-08: qty 10

## 2013-01-08 MED ORDER — FENTANYL CITRATE 0.05 MG/ML IJ SOLN
25.0000 ug/h | INTRAMUSCULAR | Status: DC
  Filled 2013-01-08: qty 50

## 2013-01-08 MED ORDER — SODIUM CHLORIDE 0.9 % IV BOLUS (SEPSIS)
1000.0000 mL | Freq: Once | INTRAVENOUS | Status: AC
  Administered 2013-01-08: 1000 mL via INTRAVENOUS

## 2013-01-08 MED ORDER — CHLORHEXIDINE GLUCONATE 0.12 % MT SOLN
15.0000 mL | Freq: Two times a day (BID) | OROMUCOSAL | Status: DC
  Administered 2013-01-09 – 2013-01-23 (×27): 15 mL via OROMUCOSAL
  Filled 2013-01-08 (×31): qty 15

## 2013-01-08 MED ORDER — SODIUM CHLORIDE 0.45 % IV SOLN
INTRAVENOUS | Status: DC
  Administered 2013-01-08: 11:00:00 via INTRAVENOUS
  Administered 2013-01-09: 500 mL via INTRAVENOUS

## 2013-01-08 MED ORDER — LABETALOL HCL 5 MG/ML IV SOLN
10.0000 mg | INTRAVENOUS | Status: DC | PRN
  Administered 2013-01-11 – 2013-01-18 (×10): 10 mg via INTRAVENOUS
  Filled 2013-01-08 (×11): qty 4

## 2013-01-08 NOTE — Consult Note (Signed)
Reason for Consult: Acute pancreatitis Referring Physician: [facility]  [name] [name] HPI: This is a 79 year old female transferred from [facility] for worsening of her clinical status.  She was admitted with an acute pancreatitis and the CT scan, per the notes, reports a large gallstone and a mildly dilated CBD.  She had some slurred speech and it appeared that she suffered a CVA, but was not a candidate for TPA.  In the past she had imaging in the [place] and there was evidence of the large gallstone.  Her transaminases continue to increase, but her lipase has declined.  Currently she is in a significant amount of pain.  Past Medical History  Diagnosis Date  . Hypertension   . Hyperlipidemia   . Palpitations   . Thyroid disease   . Weakness generalized   . Anemia   . Other urinary problems   . Lightheadedness   . Dyspnea     w/ atypical upper airway symptoms? all vcd/lpr/gerd  . Breast pain   . Vasovagal syncope   . Cellulitis and abscess of unspecified digit   . Urinary tract infection, site not specified   . Urgency of urination   . Other symptoms involving abdomen and pelvis   . Rash and other nonspecific skin eruption   . Unspecified venous (peripheral) insufficiency   . Edema   . Osteoarthrosis, unspecified whether generalized or localized, unspecified site   . Monoclonal paraproteinemia   . Irritable bowel syndrome   . Other B-complex deficiencies   . Unspecified hypothyroidism   . GERD (gastroesophageal reflux disease)   . Anxiety   . Torus palatinus     Past Surgical History  Procedure Laterality Date  . Back surgery    . Shoulder surgery    . Cholecystectomy    . Abdominal hysterectomy    . Vaginosacropexy    . Suprapubic bladder suspension      Family History  Problem Relation Age of Onset  . Hypertension Other   . Hypertension Father     Social History:  reports that she has never smoked. She does not have any smokeless  tobacco history on file. She reports that she does not drink alcohol or use illicit drugs.  Allergies:  Allergies  Allergen Reactions  . Amlodipine Besylate     REACTION: edema  . Cefuroxime Axetil     REACTION: nausea  . Nitrofurantoin     REACTION: nausea  . Sulfadiazine     Medications:  Scheduled: . antiseptic oral rinse  15 mL Mouth Rinse q12n4p  . aspirin  300 mg Rectal Daily  . chlorhexidine  15 mL Mouth Rinse BID  . pantoprazole (PROTONIX) IV  40 mg Intravenous QHS   Continuous: . sodium chloride 20 mL/hr at 01/08/13 1044  .  sodium bicarbonate  infusion 1000 mL 125 mL/hr at 01/08/13 1640    Results for orders placed during the hospital encounter of 01/07/13 (from the past 24 hour(s))  MRSA PCR SCREENING     Status: None   Collection Time    01/07/13  9:25 PM      Result Value Range   MRSA by PCR NEGATIVE  NEGATIVE  POCT I-STAT 3, BLOOD GAS (G3+)     Status: Abnormal   Collection Time    01/07/13 10:19 PM      Result Value Range   pH, Arterial 7.317 (*) 7.350 - 7.450   pCO2 arterial 29.4 (*) 35.0 - 45.0 mmHg  pO2, Arterial 69.0 (*) 80.0 - 100.0 mmHg   Bicarbonate 15.0 (*) 20.0 - 24.0 mEq/L   TCO2 16  0 - 100 mmol/L   O2 Saturation 92.0     Acid-base deficit 10.0 (*) 0.0 - 2.0 mmol/L   Patient temperature 98.6 F     Collection site RADIAL, ALLEN'S TEST ACCEPTABLE     Sample type ARTERIAL    TROPONIN I     Status: None   Collection Time    01/07/13 10:40 PM      Result Value Range   Troponin I <0.30  <0.30 ng/mL  URINALYSIS, ROUTINE W REFLEX MICROSCOPIC     Status: Abnormal   Collection Time    01/07/13 11:43 PM      Result Value Range   Color, Urine YELLOW  YELLOW   APPearance CLOUDY (*) CLEAR   Specific Gravity, Urine 1.028  1.005 - 1.030   pH 5.0  5.0 - 8.0   Glucose, UA NEGATIVE  NEGATIVE mg/dL   Hgb urine dipstick TRACE (*) NEGATIVE   Bilirubin Urine SMALL (*) NEGATIVE   Ketones, ur 15 (*) NEGATIVE mg/dL   Protein, ur 30 (*) NEGATIVE mg/dL    Urobilinogen, UA 1.0  0.0 - 1.0 mg/dL   Nitrite NEGATIVE  NEGATIVE   Leukocytes, UA SMALL (*) NEGATIVE  URINE MICROSCOPIC-ADD ON     Status: Abnormal   Collection Time    01/07/13 11:43 PM      Result Value Range   Squamous Epithelial / LPF RARE  RARE   WBC, UA 3-6  <3 WBC/hpf   RBC / HPF 0-2  <3 RBC/hpf   Bacteria, UA RARE  RARE   Casts HYALINE CASTS (*) NEGATIVE  GLUCOSE, CAPILLARY     Status: None   Collection Time    01/08/13 12:15 AM      Result Value Range   Glucose-Capillary 86  70 - 99 mg/dL  CORTISOL     Status: None   Collection Time    01/08/13  2:07 AM      Result Value Range   Cortisol, Plasma 43.3    PROCALCITONIN     Status: None   Collection Time    01/08/13  2:07 AM      Result Value Range   Procalcitonin 0.45    HEMOGLOBIN A1C     Status: Abnormal   Collection Time    01/08/13  2:07 AM      Result Value Range   Hemoglobin A1C 5.7 (*) <5.7 %   Mean Plasma Glucose 117 (*) <117 mg/dL  HEPATIC FUNCTION PANEL     Status: Abnormal   Collection Time    01/08/13  5:01 AM      Result Value Range   Total Protein 5.8 (*) 6.0 - 8.3 g/dL   Albumin 1.9 (*) 3.5 - 5.2 g/dL   AST 339 (*) 0 - 37 U/L   ALT 188 (*) 0 - 35 U/L   Alkaline Phosphatase 62  39 - 117 U/L   Total Bilirubin 0.6  0.3 - 1.2 mg/dL   Bilirubin, Direct 0.2  0.0 - 0.3 mg/dL   Indirect Bilirubin 0.4  0.3 - 0.9 mg/dL  TROPONIN I     Status: None   Collection Time    01/08/13  5:35 AM      Result Value Range   Troponin I <0.30  <0.30 ng/mL  CBC     Status: Abnormal   Collection Time  01/08/13  5:35 AM      Result Value Range   WBC 20.8 (*) 4.0 - 10.5 K/uL   RBC 3.40 (*) 3.87 - 5.11 MIL/uL   Hemoglobin 10.3 (*) 12.0 - 15.0 g/dL   HCT 29.6 (*) 36.0 - 46.0 %   MCV 87.1  78.0 - 100.0 fL   MCH 30.3  26.0 - 34.0 pg   MCHC 34.8  30.0 - 36.0 g/dL   RDW 14.6  11.5 - 15.5 %   Platelets 103 (*) 150 - 400 K/uL  BASIC METABOLIC PANEL     Status: Abnormal   Collection Time    01/08/13  5:35 AM       Result Value Range   Sodium 139  135 - 145 mEq/L   Potassium 4.5  3.5 - 5.1 mEq/L   Chloride 109  96 - 112 mEq/L   CO2 17 (*) 19 - 32 mEq/L   Glucose, Bld 94  70 - 99 mg/dL   BUN 69 (*) 6 - 23 mg/dL   Creatinine, Ser 3.12 (*) 0.50 - 1.10 mg/dL   Calcium 7.2 (*) 8.4 - 10.5 mg/dL   GFR calc non Af Amer 13 (*) >90 mL/min   GFR calc Af Amer 15 (*) >90 mL/min  MAGNESIUM     Status: None   Collection Time    01/08/13  5:35 AM      Result Value Range   Magnesium 1.7  1.5 - 2.5 mg/dL  LIPID PANEL     Status: Abnormal   Collection Time    01/08/13  5:35 AM      Result Value Range   Cholesterol 110  0 - 200 mg/dL   Triglycerides 102  <150 mg/dL   HDL 13 (*) >39 mg/dL   Total CHOL/HDL Ratio 8.5     VLDL 20  0 - 40 mg/dL   LDL Cholesterol 77  0 - 99 mg/dL  POCT I-STAT 3, BLOOD GAS (G3+)     Status: Abnormal   Collection Time    01/08/13  9:47 AM      Result Value Range   pH, Arterial 7.342 (*) 7.350 - 7.450   pCO2 arterial 27.5 (*) 35.0 - 45.0 mmHg   pO2, Arterial 71.0 (*) 80.0 - 100.0 mmHg   Bicarbonate 14.9 (*) 20.0 - 24.0 mEq/L   TCO2 16  0 - 100 mmol/L   O2 Saturation 93.0     Acid-base deficit 10.0 (*) 0.0 - 2.0 mmol/L   Patient temperature 98.6 F     Collection site RADIAL, ALLEN'S TEST ACCEPTABLE     Drawn by Operator     Sample type ARTERIAL    GLUCOSE, CAPILLARY     Status: Abnormal   Collection Time    01/08/13  9:50 AM      Result Value Range   Glucose-Capillary 105 (*) 70 - 99 mg/dL  CREATININE, URINE, RANDOM     Status: None   Collection Time    01/08/13 12:10 PM      Result Value Range   Creatinine, Urine 98.55    SODIUM, URINE, RANDOM     Status: None   Collection Time    01/08/13 12:10 PM      Result Value Range   Sodium, Ur 45    GLUCOSE, CAPILLARY     Status: Abnormal   Collection Time    01/08/13  1:49 PM      Result Value Range   Glucose-Capillary 108 (*)  70 - 99 mg/dL  BASIC METABOLIC PANEL     Status: Abnormal   Collection Time    01/08/13   3:00 PM      Result Value Range   Sodium 140  135 - 145 mEq/L   Potassium 3.9  3.5 - 5.1 mEq/L   Chloride 109  96 - 112 mEq/L   CO2 19  19 - 32 mEq/L   Glucose, Bld 112 (*) 70 - 99 mg/dL   BUN 64 (*) 6 - 23 mg/dL   Creatinine, Ser 2.55 (*) 0.50 - 1.10 mg/dL   Calcium 7.0 (*) 8.4 - 10.5 mg/dL   GFR calc non Af Amer 17 (*) >90 mL/min   GFR calc Af Amer 20 (*) >90 mL/min  LIPASE, BLOOD     Status: Abnormal   Collection Time    01/08/13  3:00 PM      Result Value Range   Lipase 720 (*) 11 - 59 U/L  AMYLASE     Status: Abnormal   Collection Time    01/08/13  3:00 PM      Result Value Range   Amylase 857 (*) 0 - 105 U/L  HEPATIC FUNCTION PANEL     Status: Abnormal   Collection Time    01/08/13  3:00 PM      Result Value Range   Total Protein 5.7 (*) 6.0 - 8.3 g/dL   Albumin 1.9 (*) 3.5 - 5.2 g/dL   AST 355 (*) 0 - 37 U/L   ALT 259 (*) 0 - 35 U/L   Alkaline Phosphatase 68  39 - 117 U/L   Total Bilirubin 0.6  0.3 - 1.2 mg/dL   Bilirubin, Direct 0.2  0.0 - 0.3 mg/dL   Indirect Bilirubin 0.4  0.3 - 0.9 mg/dL  GLUCOSE, CAPILLARY     Status: Abnormal   Collection Time    01/08/13  3:56 PM      Result Value Range   Glucose-Capillary 103 (*) 70 - 99 mg/dL     Ct Head Wo Contrast  01/08/2013  *RADIOLOGY REPORT*  Clinical Data: 79 year old female Code stroke.  Facial droop and slurred speech.  CT HEAD WITHOUT CONTRAST  Technique:  Contiguous axial images were obtained from the base of the skull through the vertex without contrast.  Comparison: 10/30/2009 and earlier.  Findings: Minor maxillary sinus mucosal thickening.  Other Visualized paranasal sinuses and mastoids are clear.  No acute orbit or scalp soft tissue findings. No acute osseous abnormality identified.  Calcified atherosclerosis at the skull base.  Scattered posterior circulation hypodensity involving both cortex and white matter. Confluent area of cytotoxic edema pattern hypodensity in the right occipital pole, but bilateral  occipital and parietal involvement. Also suspect early subcortical white matter involvement in the left frontal lobe, most confluent area on image 19.  Hypodensity also in the central cerebellum left greater than right is new.  No definite acute intracranial hemorrhage.  No mass effect associated with the above.  No ventriculomegaly.  Basilar cisterns remain patent.,  No suspicious intracranial vascular hyperdensity.  IMPRESSION: Scattered gray and white matter edema.  Posterior circulation predominant but also left MCA involvement suspected.  No associated mass effect or hemorrhage. Top differential considerations include subacute embolic infarcts and posterior reversible encephalopathy syndrome.  Critical Value/emergent results were called by telephone at the time of interpretation on 01/08/2013 at 0341 hours to Dr. Leonel Ramsay, who verbally acknowledged these results.   Original Report Authenticated By: Roselyn Reef, M.D.  Dg Chest Port 1 View  01/08/2013  *RADIOLOGY REPORT*  Clinical Data: Central line placement.  PORTABLE CHEST - 1 VIEW  Comparison: 01/07/2013.  Findings: Right internal jugular catheter placed with the tip at the level of the right atrium.  To be at the level of the distal superior vena cava, this would need to be retracted by 3.5 cm.  No gross pneumothorax.  Elevated right hemidiaphragm with limited evaluation of the right lung base.  Pulmonary vascular congestion.  Heart size top normal.  IMPRESSION: Right internal jugular catheter placed with the tip at the level of the right atrium.  To be at the level of the distal superior vena cava, this would need to be retracted by 3.5 cm.  No gross pneumothorax.  Elevated right hemidiaphragm with limited evaluation of the right lung base.  Pulmonary vascular congestion.  This is a call report.   Original Report Authenticated By: Genia Del, M.D.    Dg Chest Port 1 View  01/07/2013  *RADIOLOGY REPORT*  Clinical Data: Tachycardia, hypoxia   PORTABLE CHEST - 1 VIEW  Comparison: 01/07/2013; 09/19/2012; 04/02/2012  Findings: Grossly unchanged cardiac silhouette and mediastinal contours gave an persistently reduced lung volumes.  There is persistent mild elevation of right hemidiaphragm.  Grossly unchanged bilateral perihilar heterogeneous opacities.  No new focal airspace opacity.  No definite evidence of pulmonary edema. No definite pleural effusion or pneumothorax.  Unchanged bones.  IMPRESSION: Persistently reduced lung volumes with grossly unchanged perihilar and bibasilar opacities, atelectasis versus infiltrate.  Further evaluation with a PA and lateral chest radiograph may be obtained as clinically indicated.   Original Report Authenticated By: Jake Seats, MD     ROS:  As stated above in the HPI otherwise negative.  Blood pressure 163/48, pulse 99, temperature 98.7 F (37.1 C), temperature source Oral, resp. rate 18, weight 158 lb 11.7 oz (72 kg), SpO2 96.00%.    PE: Gen: Alert, uncomfortable with pain HEENT:  [place]/AT, EOMI Neck: Supple, no LAD Lungs: CTA Bilaterally CV: RRR without M/G/R ABM: Soft, diffusely tender - she did not want me to palpate, +BS Ext: No C/C/E  Assessment/Plan: 1) Acute pancreatitis. 2) Cholelithiasis. 3) CVA.     Her transaminases continue to increase, but it is rather mild.  I do not know if it will peak and then drop or continue to increase.  Her TB and AP are normal.  I will follow up on the liver enzymes for tomorrow AM.  If they continue to increase, I will order an MRCP.    Plan: 1) Liver enzymes in the AM. 2) Pain control. 3) IV hydration.  Jaleena Viviani D 01/08/2013, 5:01 PM

## 2013-01-08 NOTE — Progress Notes (Signed)
Asked by the RN to evaluate pt re change in neuro exam.  Now has RUE weakness, R facial droop and dysarthria.  Code Stroke called.  Stat head CT ordered.

## 2013-01-08 NOTE — Progress Notes (Signed)
PULMONARY  / CRITICAL CARE MEDICINE  Name: [name] [name] MRN: [id number] DOB: 1933-07-13    ADMISSION DATE:  01/07/2013 CONSULTATION DATE:  01/07/2013  REFERRING MD :  Transfer from [place]:  PCCM  CHIEF COMPLAINT:  Acute encephalopathy  BRIEF PATIENT DESCRIPTION: 79 yo without significant medical history admitted to [facility] with acute pancreatitis.  Imaging demonstrated dilated CBD and gallbladder stone.  Course was complicated by oliguria and acute encephalopathy.   SIGNIFICANT EVENTS / STUDIES:  5/4  Admitted to [place] with acute pancreatitis 5/4  Abdominal US >>>  Gallbladder stones, CBD 4 mm 5/4  Abdomen CT >>>  Diffuse acute pancreatitis, CBD mildly dilated, no mass, dilated gallbladder with gallstones, no hydronephrosis 5/5  Transferred to [facility] 5/6 Head CT>>>Scattered gray and white matter edema. Posterior circulation predominant but also left MCA involvement suspected. No associated mass effect or hemorrhage. Top differential considerations include subacute embolic infarcts and posterior reversible encephalopathy syndrome.  LINES / TUBES: 5/5 Urine catheter>>>  CULTURES: 5/5  Blood >>>  ANTIBIOTICS: None at this time  Subjective: Patient complains of being in a lot of pain.   VITAL SIGNS: Temp:  [98.1 F (36.7 C)-98.7 F (37.1 C)] 98.1 F (36.7 C) (05/06 0400) Pulse Rate:  [100-115] 103 (05/06 0700) Resp:  [16-34] 17 (05/06 0700) BP: (87-135)/(28-58) 134/42 mmHg (05/06 0700) SpO2:  [92 %-96 %] 94 % (05/06 0700) Weight:  [158 lb 11.7 oz (72 kg)] 158 lb 11.7 oz (72 kg) (05/06 0500) HEMODYNAMICS:   VENTILATOR SETTINGS:   INTAKE / OUTPUT: Intake/Output     05/05 0701 - 05/06 0700 05/06 0701 - 05/07 0700   I.V. (mL/kg) 1053 (14.6)    IV Piggyback 1000    Total Intake(mL/kg) 2053 (28.5)    Urine (mL/kg/hr) 165    Total Output 165     Net +1888            PHYSICAL EXAMINATION: General:  Appears in pain, agitated Neuro:   Somewhat confused, speech difficult to understand, Right sided weakness HEENT:  PERRL, dry membraanes. Pulses equal Cardiovascular:  Tachycardic, regular rate Lungs:  Bilateral diminished air entry, no w/r/r Abdomen:  Tender to palpation, especially in epigastrium, vol guarding, no rebound tenderness, bowel sounds diminished Musculoskeletal:  Moves all extremities, no edema Skin:  Intact  LABS:  Recent Labs Lab 01/07/13 0210 01/07/13 2219 01/07/13 2240 01/08/13 0207 01/08/13 0535  HGB 10.7*  --   --   --  10.3*  WBC 20.9*  --   --   --  20.8*  PLT 119*  --   --   --  103*  NA 138  --   --   --  139  K 4.5  --   --   --  4.5  CL 107  --   --   --  109  CO2 18*  --   --   --  17*  GLUCOSE 92  --   --   --  94  BUN 66*  --   --   --  69*  CREATININE 3.17*  --   --   --  3.12*  CALCIUM 7.3*  --   --   --  7.2*  MG 1.7  --   --   --  1.7  PHOS 4.7*  --   --   --   --   AST 219*  --   --   --   --  ALT 130*  --   --   --   --   ALKPHOS 59  --   --   --   --   BILITOT 0.6  --   --   --   --   PROT 5.9*  --   --   --   --   ALBUMIN 1.9*  --   --   --   --   APTT 32  --   --   --   --   INR 1.50*  --   --   --   --   LATICACIDVEN 2.2  --   --   --   --   TROPONINI  --   --  <0.30  --  <0.30  PROCALCITON  --   --   --  0.45  --   PHART  --  7.317*  --   --   --   PCO2ART  --  29.4*  --   --   --   PO2ART  --  69.0*  --   --   --     Recent Labs Lab 01/08/13 0015  GLUCAP 86    CXR: 05/04 >>> Stable chronic LLL and RUL densities 5/5>>>Persistently reduced lung volumes with grossly unchanged perihilar  and bibasilar opacities, atelectasis versus infiltrate.  ASSESSMENT / PLAN:  PULMONARY A:  Recently treated for pneumonia.  At risk for hypoxemic (fluid overload) and hypercarbic (opioids) respiratory failure. Mild acidosis P:   Gaol SpO2>92, currently on 5L nasal cannula Supplemental oxygen Albuterol PRN Repeat abg  CARDIOVASCULAR A: SIRS Chest pain P:  Goal  MAP>60 Trend lactate Neg Troponin EKG Place cvp, line, see renal  RENAL A:  Acute renal failure / AKI Oliguria and  Hypovolemia-improving, positive 1.8 L yesterday Severe pancreatitis Lactic acidosis Also NON AG process P:   Renal consult, may need Need cvp for volume status  Trend BMP NS@150 , consider avoid nacl with rising cl May need bicarb for NON AG process and resp acidosis bmet in afternoon  GASTROINTESTINAL A:  Cholelithiasis  Acute pancreatitis (likely gallstone). Passed? P:   NPO Protonix is GI Px May need cholecystectomy in the near future, no indications for emergent procedure Call GI today for consult, re possible ERCP? Re eval lft trend   HEMATOLOGIC A:  Thrombocytopenia (sirs related) P:   in am CBC SCD  INFECTIOUS A:  No evidence of pancreatic abscess / NECROSIS Leukocytosis P:   Defer antibiotcs, remains normotensive Trend wbc, trend temp curve  ENDOCRINE  A:  Hypothyroidism, pancreatitis P:   TSH Synthroid PO when able Ensure cbg tested  NEUROLOGIC A:  Acute encephalopathy subacute embolic infarcts questionable R/o PRES P:   Dilaudid PRN for pain, may need to reduce or use fentanyl with concern pulm reserve Neuro consulted PT/OT consult ASA 325 MRI/MRA of the brain, carotid dopplers, and echo ordered per neuro May not be safe transfer mri today  TODAY'S SUMMARY: 79 yo develoeped worsened confusion, slurred speech, and right extremity weakness this am. Head CT done that showed possible subacte embolic infarcts. Neuro seeing patient and patient will be sent for MRI of the brain today. Add bicarb for NON AG, place line, assess volume status. GI call  Joanna Puff, PA-S  I have personally obtained a history, examined the patient, evaluated laboratory and imaging results, formulated the assessment and plan and placed orders.  CRITICAL CARE:  The patient is critically ill with multiple organ systems failure and  requires high complexity  decision making for assessment and support, frequent evaluation and titration of therapies, application of advanced monitoring technologies and extensive interpretation of multiple databases. Critical Care Time devoted to patient care services described in this note is 35 minutes.  Lavon Paganini. Titus Mould, MD, [place] Pgr: [place] [facility]

## 2013-01-08 NOTE — Consult Note (Signed)
[name] [name] is an 79 y.o. female referred by Dr Titus Mould   Chief Complaint: Acute on CKD 3 HPI: 79 yo WF admitted to [facility] 01/06/13 with abd pain of variably severity for 2 wks.  Found to have acute pancreatitis sec to gallstone.  Pt unaware of any hx CKD but records from May suggests mild CKD (SCR 1.5 12/21/12) and hx of MGUS.  + hx HTN.  Pt is aphasic from acute stroke and detailed hx difficult.  May have been taking some ibuprofen.  Scr 1.4 on arrival to [place].  She received IV contrast for CT abd.  Scr has now increased to 3.17 yest and today 3.12.  No hypotension  Past Medical History  Diagnosis Date  . Hypertension   . Hyperlipidemia   . Palpitations   . Thyroid disease   . Weakness generalized   . Anemia   . Other urinary problems   . Lightheadedness   . Dyspnea     w/ atypical upper airway symptoms? all vcd/lpr/gerd  . Breast pain   . Vasovagal syncope   . Cellulitis and abscess of unspecified digit   . Urinary tract infection, site not specified   . Urgency of urination   . Other symptoms involving abdomen and pelvis   . Rash and other nonspecific skin eruption   . Unspecified venous (peripheral) insufficiency   . Edema   . Osteoarthrosis, unspecified whether generalized or localized, unspecified site   . Monoclonal paraproteinemia   . Irritable bowel syndrome   . Other B-complex deficiencies   . Unspecified hypothyroidism   . GERD (gastroesophageal reflux disease)   . Anxiety   . Torus palatinus     Past Surgical History  Procedure Laterality Date  . Back surgery    . Shoulder surgery    . Cholecystectomy    . Abdominal hysterectomy    . Vaginosacropexy    . Suprapubic bladder suspension      Family History  Problem Relation Age of Onset  . Hypertension Other   . Hypertension Father    Social History:  reports that she has never smoked. She does not have any smokeless tobacco history on file. She reports that she does not drink alcohol or  use illicit drugs.  Allergies:  Allergies  Allergen Reactions  . Amlodipine Besylate     REACTION: edema  . Cefuroxime Axetil     REACTION: nausea  . Nitrofurantoin     REACTION: nausea  . Sulfadiazine     Medications Prior to Admission  Medication Sig Dispense Refill  . aspirin 81 MG tablet Take 81 mg by mouth daily.        . cholecalciferol (VITAMIN D) 1000 UNITS tablet Take 1,000 Units by mouth daily.        . cyanocobalamin (,VITAMIN B-12,) 1000 MCG/ML injection Inject 1 mL (1,000 mcg total) into the skin every 14 (fourteen) days.  30 mL  1  . diclofenac sodium (VOLTAREN) 1 % GEL Apply 1 application topically 4 (four) times daily.  300 g  3  . Fluticasone-Salmeterol (ADVAIR DISKUS) 250-50 MCG/DOSE AEPB Inhale 1 puff into the lungs 2 (two) times daily.  60 each  5  . folic acid (FOLVITE) Q000111Q MCG tablet Take 400 mcg by mouth daily.        Marland Kitchen gabapentin (NEURONTIN) 100 MG capsule 1-2 at hs prn restless legs  90 capsule  3  . hydrochlorothiazide (HYDRODIURIL) 25 MG tablet Take 1 tablet (25 mg total) by  mouth daily.  90 tablet  3  . HYDROcodone-acetaminophen (NORCO) 10-325 MG per tablet Take 1 tablet by mouth every 6 (six) hours as needed.  100 tablet  2  . levothyroxine (SYNTHROID, LEVOTHROID) 88 MCG tablet Take 1 tablet (88 mcg total) by mouth daily before breakfast.  90 tablet  3  . loratadine (CLARITIN) 10 MG tablet Take 1 tablet (10 mg total) by mouth daily as needed.  100 tablet  3  . omeprazole (PRILOSEC) 40 MG capsule Take 1 capsule (40 mg total) by mouth daily.  90 capsule  3     Lab Results: UA: 30 protein, 3-6 wbc, 0-2 rbc + ketones   Recent Labs  01/07/13 0210 01/08/13 0535  WBC 20.9* 20.8*  HGB 10.7* 10.3*  HCT 30.5* 29.6*  PLT 119* 103*   BMET  Recent Labs  01/07/13 0210 01/08/13 0535  NA 138 139  K 4.5 4.5  CL 107 109  CO2 18* 17*  GLUCOSE 92 94  BUN 66* 69*  CREATININE 3.17* 3.12*  CALCIUM 7.3* 7.2*  PHOS 4.7*  --    LFT  Recent Labs   01/08/13 0501  PROT 5.8*  ALBUMIN 1.9*  AST 339*  ALT 188*  ALKPHOS 62  BILITOT 0.6  BILIDIR 0.2  IBILI 0.4   Ct Head Wo Contrast  01/08/2013  *RADIOLOGY REPORT*  Clinical Data: 79 year old female Code stroke.  Facial droop and slurred speech.  CT HEAD WITHOUT CONTRAST  Technique:  Contiguous axial images were obtained from the base of the skull through the vertex without contrast.  Comparison: 10/30/2009 and earlier.  Findings: Minor maxillary sinus mucosal thickening.  Other Visualized paranasal sinuses and mastoids are clear.  No acute orbit or scalp soft tissue findings. No acute osseous abnormality identified.  Calcified atherosclerosis at the skull base.  Scattered posterior circulation hypodensity involving both cortex and white matter. Confluent area of cytotoxic edema pattern hypodensity in the right occipital pole, but bilateral occipital and parietal involvement. Also suspect early subcortical white matter involvement in the left frontal lobe, most confluent area on image 19.  Hypodensity also in the central cerebellum left greater than right is new.  No definite acute intracranial hemorrhage.  No mass effect associated with the above.  No ventriculomegaly.  Basilar cisterns remain patent.,  No suspicious intracranial vascular hyperdensity.  IMPRESSION: Scattered gray and white matter edema.  Posterior circulation predominant but also left MCA involvement suspected.  No associated mass effect or hemorrhage. Top differential considerations include subacute embolic infarcts and posterior reversible encephalopathy syndrome.  Critical Value/emergent results were called by telephone at the time of interpretation on 01/08/2013 at 0341 hours to Dr. Leonel Ramsay, who verbally acknowledged these results.   Original Report Authenticated By: Roselyn Reef, M.D.    Dg Chest Port 1 View  01/07/2013  *RADIOLOGY REPORT*  Clinical Data: Tachycardia, hypoxia  PORTABLE CHEST - 1 VIEW  Comparison: 01/07/2013;  09/19/2012; 04/02/2012  Findings: Grossly unchanged cardiac silhouette and mediastinal contours gave an persistently reduced lung volumes.  There is persistent mild elevation of right hemidiaphragm.  Grossly unchanged bilateral perihilar heterogeneous opacities.  No new focal airspace opacity.  No definite evidence of pulmonary edema. No definite pleural effusion or pneumothorax.  Unchanged bones.  IMPRESSION: Persistently reduced lung volumes with grossly unchanged perihilar and bibasilar opacities, atelectasis versus infiltrate.  Further evaluation with a PA and lateral chest radiograph may be obtained as clinically indicated.   Original Report Authenticated By: Jake Seats, MD  ROS: Appetite had been goo up until last few days No CP + Abd pain Mild weakness rt arm and leg No dysuria No SOB Difficult time forming words No new joint CO  PHYSICAL EXAM: Blood pressure 143/42, pulse 114, temperature 99.1 F (37.3 C), temperature source Oral, resp. rate 24, weight 72 kg (158 lb 11.7 oz), SpO2 93.00%. HEENT: PERRLA EOMI NECK:no JVD LUNGS:decreased BS in bases CARDIAC:Tachy, reg no MRG [id number] BS + distention.  Diffuse tenderness to even mild palpation EXT:no edema NEURO:follows commands, aphasic  Assessment: 1. AKI sec to contrast nephropathy +/- NSAID's and acute pancreatitis 2. Acute pancreatitis sce gallstones 3. Met Acidosis sec AKI and pancreatitis 4. Hx MGUS IgG lambda by records 5. Hx HTN 6. Acute CVA  PLAN: 1. IV isotonic bicarb 2. Triple lumen and would check CVP 3. UNa and UCr 4. If UO remains poor despite fluids then IV lasix 5. Daily SCr 6. Discussed with Pt and family role of HD if renal fx were to worsen.  Pt would do HD if needed 7. No further renal imaging needed as Ct with contrast unremarkable   Adalee Kathan T 01/08/2013, 11:47 AM

## 2013-01-08 NOTE — Code Documentation (Signed)
79 yo wf in from [facility] at 2130 01/07/13 with confusion.  LKW unknown.  Family noted slurring of speech around 0200 & primary RN noticed facial asymmetry around 0230.  Code stroke called 0313, pt arrival from [place], stroke team arrival 0313, Neurologist arrival 0321, pt arrival in [place], [place] read by neurologist [phone]. Not a candidate for tPA due to unknown LKW. Will remain in 2105. NIH 9

## 2013-01-08 NOTE — Progress Notes (Signed)
VASCULAR LAB PRELIMINARY  PRELIMINARY  PRELIMINARY  PRELIMINARY  Carotid duplex  completed.    Preliminary report:  Bilateral:  No evidence of hemodynamically significant internal carotid artery stenosis.   Vertebral artery flow is antegrade.      [name] [name], RVT 01/08/2013, 11:35 AM

## 2013-01-08 NOTE — Procedures (Signed)
Central Venous Catheter Insertion Procedure Note DAMESHIA HORIE [id number] [birth year]  Procedure: Insertion of Central Venous Catheter Indications: Assessment of intravascular volume, Drug and/or fluid administration and Frequent blood sampling  Procedure Details Consent: Risks of procedure as well as the alternatives and risks of each were explained to the (patient/caregiver).  Consent for procedure obtained. Time Out: Verified patient identification, verified procedure, site/side was marked, verified correct patient position, special equipment/implants available, medications/allergies/relevent history reviewed, required imaging and test results available.  Performed  Maximum sterile technique was used including antiseptics, cap, gloves, gown, hand hygiene, mask and sheet. Skin prep: Chlorhexidine; local anesthetic administered A antimicrobial bonded/coated triple lumen catheter was placed in the right internal jugular vein using the Seldinger technique.  Evaluation Blood flow good Complications: No apparent complications Patient did tolerate procedure well. Chest X-ray ordered to verify placement.  CXR: pending.   US showed small small IJ , dry  Lavon Paganini. Titus Mould, MD, [place] Pgr: [facility]

## 2013-01-08 NOTE — Procedures (Signed)
Intubation Procedure Note [name] [name] [id number] Oct 11, 1932  Procedure: Intubation Indications: Respiratory insufficiency  Procedure Details Consent: Risks of procedure as well as the alternatives and risks of each were explained to the (patient/caregiver).  Consent for procedure obtained. Time Out: Verified patient identification, verified procedure, site/side was marked, verified correct patient position, special equipment/implants available, medications/allergies/relevent history reviewed, required imaging and test results available.  Performed  Maximum sterile technique was used including gloves and hand hygiene.  MAC    Evaluation Hemodynamic Status: BP stable throughout; O2 sats: stable throughout Patient's Current Condition: stable Complications: No apparent complications Patient did tolerate procedure well. Chest X-ray ordered to verify placement.  CXR: pending.  7.5 placed at 23 at the lips   Unknown Foley 01/08/2013  Patient seen and examined, agree with above note.  I dictated the care and orders written for this patient under my direction.  Rush Farmer, MD [phone]

## 2013-01-08 NOTE — Progress Notes (Signed)
Made contact with Dr. Nelda Marseille on the unit to request he come to bedside and evaluate Pt pain with RN.  Pain has been difficult to control with increasing frequency on fentanyl administration.  Abdomen now more tender on assessment and Pt states she "cannot take the pain" and is tearful.   Richardean Canal, RN

## 2013-01-08 NOTE — Progress Notes (Signed)
Chaplain Note:  Chaplain visited with pt and pt's family.  Pt was in bed, awake but sleepy.  Per pt's nurse, pt recently suffered a stroke and is now experiencing anxiousness as she recovers.  She seemed especially frustrated at not being able to speak as she normally would.  She can speak but words are slow to come and hard for her to form.  Pt's husband and daughter were at bedside in support of pt.  Chaplain offered spiritual comfort, support, and prayer for pt and pt's family. Pt's family expressed appreciation for chaplain support. Chaplain will follow up as needed.  01/08/13 1300  Clinical Encounter Type  Visited With Patient and family together  Visit Type Initial;Spiritual support  Referral From Nurse  Spiritual Encounters  Spiritual Needs Prayer;Emotional  Stress Factors  Patient Stress Factors Health changes;Loss of control;Major life changes  Family Stress Factors Loss of control;Major life changes  Jearld Lesch, Chaplain [phone]

## 2013-01-08 NOTE — Progress Notes (Signed)
ET tube at the R main bronchus orifice   Retract 1 cm.

## 2013-01-08 NOTE — Progress Notes (Signed)
  Echocardiogram 2D Echocardiogram has been performed.  [name] [name] 01/08/2013, 10:28 AM

## 2013-01-08 NOTE — Progress Notes (Signed)
ETT retracted 1 cm per MD orders. Now currently 23 cm at lip.  BBS noted. Pt. Pulling good volumes.

## 2013-01-08 NOTE — Progress Notes (Signed)
Pt electively intubated by MD at this time. Settings and ET tube placement are as charted. No complications noted. RT will monitor.

## 2013-01-08 NOTE — Consult Note (Signed)
Reason for Consult: Concern for stroke Referring Physician: Zubelevitskiy, K   CC: Confusion  History is obtained from: Patient, daughter, referring provider  HPI: [name] [name] is a 79 y.o. female who was admitted to [facility] for pancreatitis. She became confused yesterday, but was attributed to her infection and pain medications. Today, it was noted that she had an increase in her slurred speech and at that time some right sided facial weakness was also noted and therefore a code stroke was called.    LKW: unknown, but at least prior to transfer(arrived here at 9:30 pm 05/05) tpa given: no, out of window.    ROS: Unable to assess secondary to patient's altered mental status.    Past Medical History  Diagnosis Date  . Hypertension   . Hyperlipidemia   . Palpitations   . Thyroid disease   . Weakness generalized   . Anemia   . Other urinary problems   . Lightheadedness   . Dyspnea     w/ atypical upper airway symptoms? all vcd/lpr/gerd  . Breast pain   . Vasovagal syncope   . Cellulitis and abscess of unspecified digit   . Urinary tract infection, site not specified   . Urgency of urination   . Other symptoms involving abdomen and pelvis   . Rash and other nonspecific skin eruption   . Unspecified venous (peripheral) insufficiency   . Edema   . Osteoarthrosis, unspecified whether generalized or localized, unspecified site   . Monoclonal paraproteinemia   . Irritable bowel syndrome   . Other B-complex deficiencies   . Unspecified hypothyroidism   . GERD (gastroesophageal reflux disease)   . Anxiety   . Torus palatinus     Family History: Unable to assess secondary to patient's altered mental status.    Social History: Tob: Unable to assess secondary to patient's altered mental status.    Exam: Current vital signs: BP 123/47  Pulse 107  Temp(Src) 98.7 F (37.1 C) (Oral)  Resp 21  SpO2 96% Vital signs in last 24 hours: Temp:  [98.7 F (37.1 C)]  98.7 F (37.1 C) (05/06 0000) Pulse Rate:  [103-115] 107 (05/06 0200) Resp:  [18-34] 21 (05/06 0200) BP: (87-123)/(33-58) 123/47 mmHg (05/06 0200) SpO2:  [92 %-96 %] 96 % (05/06 0200)  General: in bed, NAD CV: RRR Mental Status: Patient is mildly somnolent. oriented to person only She follows questions nad commands only with repeated prompting.  She has no sign of clear aphasia, but does have latency of response.  She does not clearly neglect, though due to confusion difficult to be certain.  She is able to identify her daughter.  Cranial Nerves: II: Visual Fields are decreased on left Pupils are equal, round, and reactive to light.  Discs are difficult to visualize. III,IV, VI: EOMI without ptosis or diploplia.  V: Facial sensation is symmetric to temperature VII: Facial movement is notable for right facial weakness VIII: hearing is intact to voice X: Uvula elevates symmetrically XI: Shoulder shrug is symmetric. XII: tongue is midline without atrophy or fasciculations.  Motor: Tone is normal. Bulk is normal. 5/5 strength was present in left arm and leg, mild weakness 4/5 in right arm, 4+/5 in right leg.  Sensory: Sensation is symmetric to light touch and temperature in the arms and legs. Deep Tendon Reflexes: 2+ and symmetric in the biceps and patellae.  Plantars: Toes are downgoing bilaterally.  Cerebellar: FNF difficult to assess as patient has difficulty accomplishing the tast as described  despite repeated attempts.  Gait: Not assessed due to acute nature of evaluation and multiple medical monitors in ICU setting.  I have reviewed labs in epic and the results pertinent to this consultation are: Elevated AST, ALT elevated Cr  I have reviewed the images obtained:CT head - multiple posterior hypodensities, could be embolic stroke or PRES  Impression: 79 yo F with confusions and mild right sided weakness most likely due to ischemic infarct. The pattern could also be  suggestive of PRES, but the patietn is not hypertensive or immunosuppressed. Though renal failure can also be associated with PRES, typicall there are other risk factors as well.   Recommendations: 1. HgbA1c, fasting lipid panel 2. MRI, MRA  of the brain without contrast 3. Frequent neuro checks 4. Echocardiogram 5. Carotid dopplers 6. Prophylactic therapy-Antiplatelet med: Aspirin - dose 325 7. Risk factor modification 8. Telemetry monitoring 9. PT consult, OT consult, Speech consult    Roland Rack, MD [facility] [phone]  If 7pm- 7am, please page neurology on call at [phone].

## 2013-01-08 NOTE — Progress Notes (Signed)
Called by bedside RN, patient is complaining of significant pain, O2 demand has been increasing, WOB of breathing is clearly worsening.  Patient is asking to be intubated.  After speaking with patient, she is clearly decompensating, chest exam with increased crackles, decision made to intubate.  Spoke with family, will intubate and keep sedated until inflammation is under control.  Will start continuous sedation protocol, F/U ABG and CXR ordered and pending.  Will maintain sedated and ventilated for likely two days then extubate once inflammation is under control.  Anticipate will progress to ARDS before this is over.  Additional CC time of 40 min.  Rush Farmer, M.D. [facility] Pulmonary/Critical Care Medicine. Pager: [phone]. After hours pager: [phone].

## 2013-01-08 NOTE — Care Management Note (Addendum)
    Page 1 of 1   01/10/2013     10:37:11 AM   CARE MANAGEMENT NOTE 01/10/2013  Patient:  [name] [name], [name] [name]   Account Number:  [id number]  Date Initiated:  01/08/2013  Documentation initiated by:  Elissa Hefty  Subjective/Objective Assessment:   adm w encepalopathy     Action/Plan:   lives w husband, pcp dr Alain Marion   Anticipated DC Date:     Anticipated DC Plan:        [place]  CM consult      Choice offered to / List presented to:             Status of service:  In process, will continue to follow Medicare Important Message given?   (If response is "NO", the following Medicare IM given date fields will be blank) Date Medicare IM given:   Date Additional Medicare IM given:    Discharge Disposition:    Per UR Regulation:  Reviewed for med. necessity/level of care/duration of stay  If discussed at [place] of Stay Meetings, dates discussed:    Comments:  Contact: Joshua,Bill Other [place]                [facility] Daughter     [phone]                Manring,Tami Daughter     [phone]                [facility] Daughter     [phone]  01-10-13 10:30am Luz Lex, RNBSN- [phone] Talked to husband in room - extubated.  Patient not following commands. Husband states independent at home for 69 years.  Have traveled together extensively.  Goal is to get home but explained will probably need rehab first at some level.  Will need PT/OT consult.  CM will continue to follow.

## 2013-01-08 NOTE — Progress Notes (Signed)
I was called by the bedside nurse; the patient complaining of severe abdominal pain;   Fentanyl increased to q1h prn.

## 2013-01-08 NOTE — Progress Notes (Signed)
Stroke Team Progress Note  HISTORY [name] [name] is a 79 y.o. female who was admitted to [facility] 01/06/2013 for pancreatitis. Imaging demonstrated dilated CBD and gallbladder stone. Course was complicated by oliguria and acute encephalopathy.  She became confused yesterday 01/07/2013, but it was attributed to her infection and pain medications. She was transferred to [facility] (arrived 2130). Today 01/08/2013 at 0300, it was noted that she had an increase in her slurred speech and at that time some right sided facial weakness was also noted and therefore a code stroke was called. Patient was not a TPA candidate secondary to time last known well unknown - it was at least prior to transfer to [facility]. She was admitted for further evaluation and treatment.  SUBJECTIVE Her family is at the bedside.  Overall she feels her condition is stable.   OBJECTIVE Most recent Vital Signs: Filed Vitals:   01/08/13 0600 01/08/13 0700 01/08/13 0800 01/08/13 0900  BP: 135/38 134/42 126/46 160/44  Pulse: 102 103 104 111  Temp:      TempSrc:      Resp: 17 17 19 22   Weight:      SpO2: 94% 94% 94% 93%   CBG (last 3)   Recent Labs  01/08/13 0015  GLUCAP 86    IV Fluid Intake:   . sodium chloride    .  sodium bicarbonate  infusion 1000 mL      MEDICATIONS  . antiseptic oral rinse  15 mL Mouth Rinse q12n4p  . aspirin  300 mg Rectal Daily  . chlorhexidine  15 mL Mouth Rinse BID  . pantoprazole (PROTONIX) IV  40 mg Intravenous QHS   PRN:  albuterol, fentaNYL  Diet:  NPO  Activity:  Bedrest DVT Prophylaxis:  SCDs   CLINICALLY SIGNIFICANT STUDIES Basic Metabolic Panel:   Recent Labs Lab 01/07/13 0210 01/08/13 0535  NA 138 139  K 4.5 4.5  CL 107 109  CO2 18* 17*  GLUCOSE 92 94  BUN 66* 69*  CREATININE 3.17* 3.12*  CALCIUM 7.3* 7.2*  MG 1.7 1.7  PHOS 4.7*  --    Liver Function Tests:   Recent Labs Lab 01/07/13 0210  AST 219*  ALT 130*  ALKPHOS 59  BILITOT 0.6  PROT 5.9*  ALBUMIN  1.9*   CBC:   Recent Labs Lab 01/07/13 0210 01/08/13 0535  WBC 20.9* 20.8*  NEUTROABS 18.7*  --   HGB 10.7* 10.3*  HCT 30.5* 29.6*  MCV 87.4 87.1  PLT 119* 103*   Coagulation:   Recent Labs Lab 01/07/13 0210  LABPROT 17.7*  INR 1.50*   Cardiac Enzymes:   Recent Labs Lab 01/07/13 2240 01/08/13 0535  TROPONINI <0.30 <0.30   Urinalysis:   Recent Labs Lab 01/07/13 2343  COLORURINE YELLOW  LABSPEC 1.028  PHURINE 5.0  GLUCOSEU NEGATIVE  HGBUR TRACE*  BILIRUBINUR SMALL*  KETONESUR 15*  PROTEINUR 30*  UROBILINOGEN 1.0  NITRITE NEGATIVE  LEUKOCYTESUR SMALL*   Lipid Panel    Component Value Date/Time   CHOL 110 01/08/2013 0535   TRIG 102 01/08/2013 0535   HDL 13* 01/08/2013 0535   CHOLHDL 8.5 01/08/2013 0535   VLDL 20 01/08/2013 0535   LDLCALC 77 01/08/2013 0535   HgbA1C  Lab Results  Component Value Date   HGBA1C 5.8 09/15/2011    Urine Drug Screen:   No results found for this basename: labopia,  cocainscrnur,  labbenz,  amphetmu,  thcu,  labbarb    Alcohol Level: No results  found for this basename: ETH,  in the last 168 hours  CT of the brain  01/08/2013  Scattered gray and white matter edema.  Posterior circulation predominant but also left MCA involvement suspected.  No associated mass effect or hemorrhage. Top differential considerations include subacute embolic infarcts and posterior reversible encephalopathy syndrome.    MRI of the brain    MRA of the brain    2D Echocardiogram    Carotid Doppler    CXR  01/07/2013  Persistently reduced lung volumes with grossly unchanged perihilar and bibasilar opacities, atelectasis versus infiltrate.      EKG  sinus tachycardia.   Therapy Recommendations   Physical Exam   Pleasant middle aged caucasian lady not in distress.Awake alert. Afebrile. Head is nontraumatic. Neck is supple without bruit. Hearing is normal. Cardiac exam no murmur or gallop. Lungs are clear to auscultation. Distal pulses are well  felt. Neurological Exam ;  Awake alert disoriented. Diminished attention, easy distractibility. Decreased recall. Follows one and few 2 step commands. Speech and language appear normal. Extraocular movements are full range without nystagmus. Blinks to threat bilaterally. Face is symmetric without weakness. Tongue is midline. Motor system exam reveals no upper or lower extremity drift however mild weakness of right grip and intrinsic hand muscles. Orbits left over right UE. Diminished fine finger movements on the right. Symmetric lower extremity strength without focal weakness. Deep tendon flexes are symmetric. Plantars are downgoing. Sensation is intact. There is mild impaired right finger-to-nose coordination. Gait was not tested. ASSESSMENT [name] [name] is a 79 y.o. female presenting with acute confusion in the setting of pancreatitis. Imaging confirms bilateral hypodensities in the anterior and posterior circulation - embolic infarcts vs PRES given malignant hypertension prior to transfer here.  On aspirin 81 mg orally every day prior to admission. Now on aspirin suppository for secondary stroke prevention.   Malignant hypertension at [facility], leading to dx of possible PRES Hyperlipidemia, LDL 77, not on statin PTA, at goal LDL < 100  Hospital day # 1  TREATMENT/PLAN  Continue aspirin suppository for secondary stroke prevention.  MRI when medically stable  SHARON BIBY, MSN, RN, ANVP-BC, ANP-BC, GNP-BC [facility] Pager: [phone] 01/08/2013 10:16 AM  I have personally obtained a history, examined the patient, evaluated imaging results, and formulated the assessment and plan of care. I agree with the above.  Antony Contras

## 2013-01-08 NOTE — Progress Notes (Signed)
Dr. Oliver Pila notified of pt's slurred speech, R sided facial droop and R sided weakness.

## 2013-01-09 ENCOUNTER — Inpatient Hospital Stay (HOSPITAL_COMMUNITY): Payer: Medicare Other

## 2013-01-09 LAB — BLOOD GAS, ARTERIAL
Acid-base deficit: 2.7 mmol/L — ABNORMAL HIGH (ref 0.0–2.0)
Drawn by: 347621
FIO2: 0.4 %
O2 Saturation: 95.6 %
PEEP: 5 cmH2O
Patient temperature: 98.7
RATE: 18 resp/min
pO2, Arterial: 79.2 mmHg — ABNORMAL LOW (ref 80.0–100.0)

## 2013-01-09 LAB — CBC WITH DIFFERENTIAL/PLATELET
Basophils Relative: 0 % (ref 0–1)
Eosinophils Absolute: 0 10*3/uL (ref 0.0–0.7)
Eosinophils Relative: 0 % (ref 0–5)
MCH: 30.8 pg (ref 26.0–34.0)
MCHC: 35.4 g/dL (ref 30.0–36.0)
MCV: 87 fL (ref 78.0–100.0)
Neutrophils Relative %: 85 % — ABNORMAL HIGH (ref 43–77)
Platelets: 114 10*3/uL — ABNORMAL LOW (ref 150–400)
RDW: 14.8 % (ref 11.5–15.5)

## 2013-01-09 LAB — GLUCOSE, CAPILLARY
Glucose-Capillary: 96 mg/dL (ref 70–99)
Glucose-Capillary: 96 mg/dL (ref 70–99)

## 2013-01-09 LAB — COMPREHENSIVE METABOLIC PANEL
ALT: 185 U/L — ABNORMAL HIGH (ref 0–35)
AST: 185 U/L — ABNORMAL HIGH (ref 0–37)
Alkaline Phosphatase: 67 U/L (ref 39–117)
CO2: 23 mEq/L (ref 19–32)
Chloride: 111 mEq/L (ref 96–112)
Creatinine, Ser: 2.13 mg/dL — ABNORMAL HIGH (ref 0.50–1.10)
GFR calc non Af Amer: 21 mL/min — ABNORMAL LOW (ref >90)
Potassium: 3.6 mEq/L (ref 3.5–5.1)
Total Bilirubin: 0.6 mg/dL (ref 0.3–1.2)

## 2013-01-09 LAB — CALCIUM, IONIZED: Calcium, Ion: 0.93 mmol/L — ABNORMAL LOW (ref 1.13–1.30)

## 2013-01-09 LAB — TSH: TSH: 0.356 u[IU]/mL (ref 0.350–4.500)

## 2013-01-09 MED ORDER — VITAL AF 1.2 CAL PO LIQD
1000.0000 mL | ORAL | Status: DC
  Administered 2013-01-09 – 2013-01-13 (×4): 1000 mL
  Filled 2013-01-09 (×10): qty 1000

## 2013-01-09 MED ORDER — SODIUM CHLORIDE 0.9 % IV BOLUS (SEPSIS)
500.0000 mL | Freq: Once | INTRAVENOUS | Status: AC
  Administered 2013-01-09: 500 mL via INTRAVENOUS

## 2013-01-09 MED ORDER — SODIUM CHLORIDE 0.45 % IV SOLN
INTRAVENOUS | Status: DC
  Administered 2013-01-09 – 2013-01-11 (×4): via INTRAVENOUS

## 2013-01-09 MED ORDER — MIDAZOLAM HCL 2 MG/2ML IJ SOLN
1.0000 mg | INTRAMUSCULAR | Status: DC | PRN
  Administered 2013-01-09: 2 mg via INTRAVENOUS
  Filled 2013-01-09 (×2): qty 2

## 2013-01-09 MED ORDER — ASPIRIN 325 MG PO TABS
325.0000 mg | ORAL_TABLET | Freq: Every day | ORAL | Status: DC
  Administered 2013-01-09 – 2013-01-14 (×6): 325 mg via ORAL
  Filled 2013-01-09 (×6): qty 1

## 2013-01-09 MED ORDER — JEVITY 1.2 CAL PO LIQD
1000.0000 mL | ORAL | Status: DC
  Filled 2013-01-09 (×2): qty 1000

## 2013-01-09 MED ORDER — IOHEXOL 300 MG/ML  SOLN
50.0000 mL | Freq: Once | INTRAMUSCULAR | Status: AC | PRN
  Administered 2013-01-09: 50 mL via ORAL

## 2013-01-09 NOTE — Progress Notes (Signed)
INITIAL NUTRITION ASSESSMENT  DOCUMENTATION CODES Per approved criteria  -Not Applicable   INTERVENTION:  Initiate TF via OG tube with Vital AF 1.2 at 10 ml/h, hold at this rate for now, to provide 288 kcals, 18 gm protein, 195 ml free water daily.  If patient does not tolerate elemental TF via OG tube, recommend place tube into the jejunum.  NUTRITION DIAGNOSIS: Inadequate oral intake related to inability to eat as evidenced by NPO status.   Goal: Intake to meet >90% of estimated nutrition needs.  Monitor:  TF tolerance/adequacy, weight trend, labs, vent status.  Reason for Assessment: MD Consult for TF initiation and management.  79 y.o. female  Admitting Dx: Gallstone pancreatitis; acute encephalopathy  ASSESSMENT: Patient was admitted to [facility] on 5/4 with acute pancreatitis, found to have gallbladder stones. Transferred to [facility] on 5/5. Required intubation the evening of 5/6 due to increasing WOB and patient in significant pain. Patient discussed in ICU rounds. Hopeful that OGT is post pyloric. Per radiology report, the tip of the OG tube is in the distal antrum just proximal to the pylorus. Okay to start elemental feedings via OGT at this time per discussion with CCM physician.  Patient is currently intubated on ventilator support.  MV: 8 Temp:Temp (24hrs), Avg:98.8 F (37.1 C), Min:98.2 F (36.8 C), Max:99.7 F (37.6 C)   Height: Ht Readings from Last 1 Encounters:  01/08/13 5\' 3"  (1.6 m)    Weight: Wt Readings from Last 1 Encounters:  01/09/13 164 lb 3.9 oz (74.5 kg)    Ideal Body Weight: 52.3 kg  % Ideal Body Weight: 142%  Wt Readings from Last 10 Encounters:  01/09/13 164 lb 3.9 oz (74.5 kg)  12/21/12 156 lb (70.761 kg)  12/21/11 148 lb (67.132 kg)  09/15/11 145 lb (65.772 kg)  06/13/11 146 lb (66.225 kg)  05/11/11 145 lb (65.772 kg)  02/03/11 147 lb (66.679 kg)  09/22/10 146 lb (66.225 kg)  08/02/10 147 lb (66.679 kg)  02/12/10 147 lb  6.1 oz (66.852 kg)    Usual Body Weight: 145-148 lb  % Usual Body Weight: 113%  BMI:  Body mass index is 29.1 kg/(m^2). BMI using usual weight of 148 lb is 26.2.  Estimated Nutritional Needs: Kcal: 1400 Protein: 100 gm Fluid: 1.4-1.6 L  Skin: no problems noted  Diet Order: NPO  EDUCATION NEEDS: -Education not appropriate at this time   Intake/Output Summary (Last 24 hours) at 01/09/13 1216 Last data filed at 01/09/13 1200  Gross per 24 hour  Intake 4851.18 ml  Output   1685 ml  Net 3166.18 ml    Last BM: PTA   Labs:   Recent Labs Lab 01/07/13 0210 01/08/13 0535 01/08/13 1500 01/09/13 0405  NA 138 139 140 144  K 4.5 4.5 3.9 3.6  CL 107 109 109 111  CO2 18* 17* 19 23  BUN 66* 69* 64* 59*  CREATININE 3.17* 3.12* 2.55* 2.13*  CALCIUM 7.3* 7.2* 7.0* 6.6*  MG 1.7 1.7  --  1.7  PHOS 4.7*  --   --  3.0  GLUCOSE 92 94 112* 103*    CBG (last 3)   Recent Labs  01/08/13 2355 01/09/13 0414 01/09/13 0834  GLUCAP 96 96 82    Scheduled Meds: . antiseptic oral rinse  15 mL Mouth Rinse QID  . aspirin  325 mg Oral Daily  . chlorhexidine  15 mL Mouth Rinse BID  . pantoprazole (PROTONIX) IV  40 mg Intravenous QHS  Continuous Infusions: . sodium chloride 125 mL/hr at 01/09/13 1041  . feeding supplement (JEVITY 1.2 CAL)    . fentaNYL infusion INTRAVENOUS 150 mcg/hr (01/09/13 1100)    Past Medical History  Diagnosis Date  . Hypertension   . Hyperlipidemia   . Palpitations   . Thyroid disease   . Weakness generalized   . Anemia   . Other urinary problems   . Lightheadedness   . Dyspnea     w/ atypical upper airway symptoms? all vcd/lpr/gerd  . Breast pain   . Vasovagal syncope   . Cellulitis and abscess of unspecified digit   . Urinary tract infection, site not specified   . Urgency of urination   . Other symptoms involving abdomen and pelvis   . Rash and other nonspecific skin eruption   . Unspecified venous (peripheral) insufficiency   .  Edema   . Osteoarthrosis, unspecified whether generalized or localized, unspecified site   . Monoclonal paraproteinemia   . Irritable bowel syndrome   . Other B-complex deficiencies   . Unspecified hypothyroidism   . GERD (gastroesophageal reflux disease)   . Anxiety   . Torus palatinus     Past Surgical History  Procedure Laterality Date  . Back surgery    . Shoulder surgery    . Cholecystectomy    . Abdominal hysterectomy    . Vaginosacropexy    . Suprapubic bladder suspension      Molli Barrows, RD, LDN, [place] Pager [phone] After Hours Pager [phone]

## 2013-01-09 NOTE — Clinical Documentation Improvement (Signed)
RESPIRATORY FAILURE DOCUMENTATION CLARIFICATION QUERY   THIS DOCUMENT IS NOT A PERMANENT PART OF THE MEDICAL RECORD  Please update your documentation within the medical record to reflect your response to this query.                                                                                     01/09/13  Dear Dr. Nelda Marseille,  In a better effort to capture your patient's severity of illness, reflect appropriate length of stay and utilization of resources, a review of the patient medical record has revealed the following indicators.    Based on your clinical judgment, please clarify and document in a progress note and/or discharge summary the clinical condition associated with the following supporting information: "Patient is complaining of significant pain, O2 demand has been increasing, WOB of breathing is clearly worsening. Patient is asking to be intubated. After speaking with patient, she is clearly decompensating, chest exam with increased crackles, decision made to intubate."  In responding to this query please exercise your independent judgment.  The fact that a query is asked, does not imply that any particular answer is desired or expected.  Possible Clinical Conditions?   Acute Respiratory Failure  Acute on Chronic Respiratory Failure  Chronic Respiratory Failure  Other Condition________________   Supporting Information:   VS: BP-163/48, HR-103 (Sinus Tachycardia), RR-19, O2 sat-93% (on 5L/[place])--prior to intubation  Signs & Symptoms:  ABG:      PO2  71.0     PCO2  27.5       PH  7.342     BiCarb  14.9     Date of ABG's  01/08/13 @ [id number]                       You may use possible, probable, or suspect with inpatient documentation.  Possible, probable, suspected diagnoses MUST be documented at the time of discharge.  Reviewed:  no additional documentation provided  Thank You,  Posey Pronto, RN, BSN, [place] Clinical Documentation Specialist Pager [phone]  Office  [phone]  HIM department [facility]

## 2013-01-09 NOTE — Progress Notes (Signed)
Pt does not meet inclusion criteria for Adult ICU Electrolyte Replacement Protocol secondary to GFR 24. Mg 1.7 reported to Dr Chase Caller

## 2013-01-09 NOTE — Progress Notes (Signed)
PULMONARY  / CRITICAL CARE MEDICINE  Name: [name] [name] MRN: [id number] DOB: [birth year]    ADMISSION DATE:  01/07/2013 CONSULTATION DATE:  01/07/2013  REFERRING MD :  Transfer from [place]:  PCCM  CHIEF COMPLAINT:  Acute encephalopathy  BRIEF PATIENT DESCRIPTION: 79 yo without significant medical history admitted to [facility] with acute pancreatitis.  Imaging demonstrated dilated CBD and gallbladder stone.  Course was complicated by oliguria and acute encephalopathy.   SIGNIFICANT EVENTS / STUDIES:  5/4  Admitted to [facility] with acute pancreatitis 5/4  Abdominal US >>>  Gallbladder stones, CBD 4 mm 5/4  Abdomen CT >>>  Diffuse acute pancreatitis, CBD mildly dilated, no mass, dilated gallbladder with gallstones, no hydronephrosis 5/5  Transferred to [facility] 5/6 Head CT>>>Scattered gray and white matter edema. Posterior circulation predominant but also left MCA involvement suspected. No associated mass effect or hemorrhage. Top differential considerations include subacute embolic infarcts and posterior reversible encephalopathy syndrome. 5/7 patient intubated due to increasing WOB and patient in significant pain.   LINES / TUBES: 5/5 Urine catheter>>> 5/6 OETT>>> 5/6 CVC RIJ>>>  CULTURES: 5/5  Blood >>>  ANTIBIOTICS: None at this time  Subjective: Pt intubated yesterday for increasing wob and increasing pain. Pt is sedated today.   VITAL SIGNS: Temp:  [98.7 F (37.1 C)-99.7 F (37.6 C)] 98.7 F (37.1 C) (05/07 0411) Pulse Rate:  [97-133] 97 (05/07 0700) Resp:  [16-26] 18 (05/07 0700) BP: (86-186)/(35-62) 129/36 mmHg (05/07 0700) SpO2:  [93 %-98 %] 97 % (05/07 0700) FiO2 (%):  [40 %-50 %] 40 % (05/07 0700) Weight:  [164 lb 3.9 oz (74.5 kg)] 164 lb 3.9 oz (74.5 kg) (05/07 0300) HEMODYNAMICS: CVP:  [7 mmHg-8 mmHg] 8 mmHg VENTILATOR SETTINGS: Vent Mode:  [-] PRVC FiO2 (%):  [40 %-50 %] 40 % Set Rate:  [18 bmp] 18 bmp Vt Set:  [420 mL] 420  mL PEEP:  [5 cmH20] 5 cmH20 Plateau Pressure:  [13 cmH20-20 cmH20] 15 cmH20 INTAKE / OUTPUT: Intake/Output     05/06 0701 - 05/07 0700 05/07 0701 - 05/08 0700   I.V. (mL/kg) 3441.3 (46.2)    IV Piggyback 1000    Total Intake(mL/kg) 4441.3 (59.6)    Urine (mL/kg/hr) 1510 (0.8)    Total Output 1510     Net +2931.3            PHYSICAL EXAMINATION: General:  Well nourished elderly white female in nad Neuro:  Sedated rass -1, follows commands, weak rue remains HEENT:  PERRL. Pulses equal Cardiovascular:  Tachycardic, regular rate Lungs:  Bilateral diminished air entry, no w/r/r Abdomen:  No distension. Bowel sounds diminished Musculoskeletal:  No obvious deformities. no edema Skin:  Intact  LABS:  Recent Labs Lab 01/07/13 0210  01/07/13 2240 01/08/13 0207 01/08/13 0501 01/08/13 0535 01/08/13 0947 01/08/13 1500 01/08/13 1946 01/09/13 0405 01/09/13 0513  HGB 10.7*  --   --   --   --  10.3*  --   --   --  9.0*  --   WBC 20.9*  --   --   --   --  20.8*  --   --   --  10.6*  --   PLT 119*  --   --   --   --  103*  --   --   --  114*  --   NA 138  --   --   --   --  139  --  140  --  144  --   K 4.5  --   --   --   --  4.5  --  3.9  --  3.6  --   CL 107  --   --   --   --  109  --  109  --  111  --   CO2 18*  --   --   --   --  17*  --  19  --  23  --   GLUCOSE 92  --   --   --   --  94  --  112*  --  103*  --   BUN 66*  --   --   --   --  69*  --  64*  --  59*  --   CREATININE 3.17*  --   --   --   --  3.12*  --  2.55*  --  2.13*  --   CALCIUM 7.3*  --   --   --   --  7.2*  --  7.0*  --  6.6*  --   MG 1.7  --   --   --   --  1.7  --   --   --  1.7  --   PHOS 4.7*  --   --   --   --   --   --   --   --  3.0  --   AST 219*  --   --   --  339*  --   --  355*  --  185*  --   ALT 130*  --   --   --  188*  --   --  259*  --  185*  --   ALKPHOS 59  --   --   --  62  --   --  68  --  67  --   BILITOT 0.6  --   --   --  0.6  --   --  0.6  --  0.6  --   PROT 5.9*  --   --   --   5.8*  --   --  5.7*  --  5.1*  --   ALBUMIN 1.9*  --   --   --  1.9*  --   --  1.9*  --  1.6*  --   APTT 32  --   --   --   --   --   --   --   --   --   --   INR 1.50*  --   --   --   --   --   --   --   --   --   --   LATICACIDVEN 2.2  --   --   --   --   --   --   --   --   --   --   TROPONINI  --   --  <0.30  --   --  <0.30  --   --   --   --   --   PROCALCITON  --   --   --  0.45  --   --   --   --   --   --   --   PHART  --   < >  --   --   --   --  7.342*  --  7.263*  --  7.392  PCO2ART  --   < >  --   --   --   --  27.5*  --  41.7  --  36.0  PO2ART  --   < >  --   --   --   --  71.0*  --  86.2  --  79.2*  < > = values in this interval not displayed.  Recent Labs Lab 01/08/13 1349 01/08/13 1556 01/08/13 1952 01/08/13 2355 01/09/13 0414  GLUCAP 108* 103* 105* 96 96    CXR: 05/04 >>> Stable chronic LLL and RUL densities 5/5>>>Persistently reduced lung volumes with grossly unchanged perihilar  and bibasilar opacities, atelectasis versus infiltrate. 5/7>>>The endotracheal tube appears to be in good position. No change in pleural effusions and interstitial edema.  ASSESSMENT / PLAN:  PULMONARY A: Acute Respiratory Failure-secondary to pain  Recently treated for pneumonia.  At risk for hypoxemic (fluid overload) and hypercarbic (opioids) respiratory failure. P:   Full vent support, attempt to wean now as on such low O2 needs and support  Keep same MV Albuterol PRN Repeat abg reviewed Repeat cxr in am  CARDIOVASCULAR A: SIRS Chest pain-resolved Echo 5/6 showed grade 1 diastolic dysfunction, LVEF 80% hypovolemic P:  Goal MAP>65-70 with cva concern CVP 8, consider bolus  RENAL A:  Acute renal failure / AKI-secondary to contrast nephropathy and severe acute pancreatitis Oliguria and  Hypovolemia-improving, positive 5 L past 24 hrs Hypocalcemia r/o P:    Trend BMP NS@75  per renal, bolus, see cvs Calcium corrects, no role replace assess ion in setting pancreatitis   Tolerate Cl, NA ris ewith concern CVA  GASTROINTESTINAL A:  Cholelithiasis  Acute pancreatitis (likely gallstone). Passed? P:   Protonix is GI Px May need cholecystectomy in the near future, no indications for emergent procedure GI recommends MRCP if LFTs continue to rise, they have not Trend lfts Post pyloric feeding is goal, stop at 10 cc/hr, follow abdo pain, if increase stop NO tpn indicated  HEMATOLOGIC A:  Thrombocytopenia (sirs related) Anemia P:  CBC am SCD If plat rise further, add sub q hep   INFECTIOUS A:  No evidence of pancreatic abscess / NECROSIS Leukocytosis P:   Defer antibiotcs, remains normotensive Trend wbc, trend temp curve  ENDOCRINE  A:  Hypothyroidism, pancreatitis, r/o sick euthyroid P:   TSH last wnl, repeat Synthroid PO when able cbg tested  NEUROLOGIC A:  Acute encephalopathy subacute embolic infarcts questionable-carotid doppler normal R/o PRES P:   Fentanyl and versed  Neuro on board ASA 325 MRI/MRA of the brain today wua  TODAY'S SUMMARY: Pt intubated and sedated. Renal function slightly improving and LFTs are going down. MRi, weaning  Joanna Puff, PA-S  I have personally obtained a history, examined the patient, evaluated laboratory and imaging results, formulated the assessment and plan and placed orders.  CRITICAL CARE:  The patient is critically ill with multiple organ systems failure and requires high complexity decision making for assessment and support, frequent evaluation and titration of therapies, application of advanced monitoring technologies and extensive interpretation of multiple databases. Critical Care Time devoted to patient care services described in this note is 35 minutes.  Lavon Paganini. Titus Mould, MD, [place] Pgr: [place] [facility]

## 2013-01-09 NOTE — Progress Notes (Signed)
Stroke Team Progress Note  HISTORY [name] [name] is a 79 y.o. female who was admitted to [facility] 01/06/2013 for pancreatitis. Imaging demonstrated dilated CBD and gallbladder stone. Course was complicated by oliguria and acute encephalopathy.  She became confused yesterday 01/07/2013, but it was attributed to her infection and pain medications. She was transferred to [facility] (arrived 2130). Today 01/08/2013 at 0300, it was noted that she had an increase in her slurred speech and at that time some right sided facial weakness was also noted and therefore a code stroke was called. Patient was not a TPA candidate secondary to time last known well unknown - it was at least prior to transfer to [facility]. She was admitted for further evaluation and treatment.  SUBJECTIVE No family is at the bedside - family arrived during rounds.  She is purposeful in her movement today.  OBJECTIVE Most recent Vital Signs: Filed Vitals:   01/09/13 0800 01/09/13 0835 01/09/13 0845 01/09/13 0900  BP: 119/39  119/39 130/73  Pulse: 98  99 99  Temp:  98.2 F (36.8 C)    TempSrc:  Oral    Resp: 18  18 16   Height:      Weight:      SpO2: 96%   97%   CBG (last 3)   Recent Labs  01/08/13 2355 01/09/13 0414 01/09/13 0834  GLUCAP 96 96 82    IV Fluid Intake:   . sodium chloride 500 mL (01/09/13 0622)  . fentaNYL infusion INTRAVENOUS 100 mcg/hr (01/09/13 0800)  . midazolam (VERSED) infusion 1 mg/hr (01/09/13 0130)  .  sodium bicarbonate  infusion 1000 mL 75 mL/hr at 01/09/13 0756    MEDICATIONS  . antiseptic oral rinse  15 mL Mouth Rinse QID  . aspirin  300 mg Rectal Daily  . chlorhexidine  15 mL Mouth Rinse BID  . pantoprazole (PROTONIX) IV  40 mg Intravenous QHS   PRN:  albuterol, fentaNYL, fentaNYL, labetalol, midazolam  Diet:  NPO  Activity:  Bedrest DVT Prophylaxis:  SCDs   CLINICALLY SIGNIFICANT STUDIES Basic Metabolic Panel:   Recent Labs Lab 01/07/13 0210 01/08/13 0535 01/08/13 1500  01/09/13 0405  NA 138 139 140 144  K 4.5 4.5 3.9 3.6  CL 107 109 109 111  CO2 18* 17* 19 23  GLUCOSE 92 94 112* 103*  BUN 66* 69* 64* 59*  CREATININE 3.17* 3.12* 2.55* 2.13*  CALCIUM 7.3* 7.2* 7.0* 6.6*  MG 1.7 1.7  --  1.7  PHOS 4.7*  --   --  3.0   Liver Function Tests:   Recent Labs Lab 01/08/13 1500 01/09/13 0405  AST 355* 185*  ALT 259* 185*  ALKPHOS 68 67  BILITOT 0.6 0.6  PROT 5.7* 5.1*  ALBUMIN 1.9* 1.6*   CBC:   Recent Labs Lab 01/07/13 0210 01/08/13 0535 01/09/13 0405  WBC 20.9* 20.8* 10.6*  NEUTROABS 18.7*  --  8.9*  HGB 10.7* 10.3* 9.0*  HCT 30.5* 29.6* 25.4*  MCV 87.4 87.1 87.0  PLT 119* 103* 114*   Coagulation:   Recent Labs Lab 01/07/13 0210  LABPROT 17.7*  INR 1.50*   Cardiac Enzymes:   Recent Labs Lab 01/07/13 2240 01/08/13 0535  TROPONINI <0.30 <0.30   Urinalysis:   Recent Labs Lab 01/07/13 2343  COLORURINE YELLOW  LABSPEC 1.028  PHURINE 5.0  GLUCOSEU NEGATIVE  HGBUR TRACE*  BILIRUBINUR SMALL*  KETONESUR 15*  PROTEINUR 30*  UROBILINOGEN 1.0  NITRITE NEGATIVE  LEUKOCYTESUR SMALL*   Lipid Panel  Component Value Date/Time   CHOL 110 01/08/2013 0535   TRIG 102 01/08/2013 0535   HDL 13* 01/08/2013 0535   CHOLHDL 8.5 01/08/2013 0535   VLDL 20 01/08/2013 0535   LDLCALC 77 01/08/2013 0535   HgbA1C  Lab Results  Component Value Date   HGBA1C 5.7* 01/08/2013    Urine Drug Screen:   No results found for this basename: labopia,  cocainscrnur,  labbenz,  amphetmu,  thcu,  labbarb    Alcohol Level: No results found for this basename: ETH,  in the last 168 hours  CT of the brain  01/08/2013  Scattered gray and white matter edema.  Posterior circulation predominant but also left MCA involvement suspected.  No associated mass effect or hemorrhage. Top differential considerations include subacute embolic infarcts and posterior reversible encephalopathy syndrome.    MRI of the brain    MRA of the brain    2D Echocardiogram  EF 80%  There is severe septal thickening consistent with hypertrophic cardiomyopathy.  Carotid Doppler  Bilateral: No evidence of hemodynamically significant internal carotid artery stenosis. Vertebral artery flow is antegrade.   CXR  01/09/2013   1. The endotracheal tube appears to be in good position. 2. No change in pleural effusions and interstitial edema.  EKG  sinus tachycardia.   Therapy Recommendations   Physical Exam   Pleasant middle aged caucasian lady not in distress.Awake alert. Afebrile. Head is nontraumatic. Neck is supple without bruit. Hearing is normal. Cardiac exam no murmur or gallop. Lungs are clear to auscultation. Distal pulses are well felt. Neurological Exam ;  Awake alert disoriented. Diminished attention, easy distractibility. Decreased recall. Follows one and few 2 step commands. Speech and language appear normal. Extraocular movements are full range without nystagmus. Blinks to threat bilaterally. Face is symmetric without weakness. Tongue is midline. Motor system exam reveals no upper or lower extremity drift however mild weakness of right grip and intrinsic hand muscles. Orbits left over right UE. Diminished fine finger movements on the right. Symmetric lower extremity strength without focal weakness. Deep tendon flexes are symmetric. Plantars are downgoing. Sensation is intact. There is mild impaired right finger-to-nose coordination. Gait was not tested.  ASSESSMENT [name] [name] is a 79 y.o. female presenting with acute confusion in the setting of pancreatitis. Imaging confirms bilateral hypodensities in the anterior and posterior circulation - embolic infarcts vs PRES given malignant hypertension prior to transfer here.  On aspirin 81 mg orally every day prior to admission. Now on aspirin suppository for secondary stroke prevention.   Malignant hypertension at [facility], leading to dx of possible PRES Hyperlipidemia, LDL 77, not on statin PTA, at goal LDL <  100  Hospital day # 2  TREATMENT/PLAN  Continue aspirin suppository for secondary stroke prevention.  Ongoing aggressive BP management  MRI when medically stable  Dr. Leonie Man discussed diagnosis, prognosis and plan of care with family.   Burnetta Sabin, MSN, RN, ANVP-BC, ANP-BC, [facility] Pager: [phone] 01/09/2013 9:53 AM  I have personally obtained a history, examined the patient, evaluated imaging results, and formulated the assessment and plan of care. I agree with the above. Antony Contras

## 2013-01-09 NOTE — Progress Notes (Signed)
Chaplain Note: Chaplain visited with pt an pt's family.  Pt was in bed, intubated and did not communicate during this visit.  Pt's husband and daughter were at bedside.  Chaplain provided spiritual comfort, support, and prayer for pt and pt's family.  Pt's family expressed appreciation for chaplain support.  Chaplain will follow up as needed.  01/09/13 1000  Clinical Encounter Type  Visited With Patient and family together  Visit Type Follow-up;Spiritual support  Referral From Nurse  Spiritual Encounters  Spiritual Needs Prayer;Emotional  Stress Factors  Patient Stress Factors Health changes;Major life changes  Family Stress Factors Lack of knowledge;Loss of control;Major life changes  Jearld Lesch, Chaplain [phone]

## 2013-01-09 NOTE — Progress Notes (Signed)
S:now intubated O:BP 129/36  Pulse 97  Temp(Src) 98.7 F (37.1 C) (Oral)  Resp 18  Ht 5\' 3"  (1.6 m)  Wt 74.5 kg (164 lb 3.9 oz)  BMI 29.1 kg/m2  SpO2 97%  Intake/Output Summary (Last 24 hours) at 01/09/13 0746 Last data filed at 01/09/13 0600  Gross per 24 hour  Intake 4441.29 ml  Output   1510 ml  Net 2931.29 ml   Weight change: 2.5 kg (5 lb 8.2 oz) [id number] and sedated CVS:sl tachy, reg Resp:basilar crackle [id number] BS diffusely tender Ext:no edema NEURO:opens eyes and winces to pain in Abd   . antiseptic oral rinse  15 mL Mouth Rinse QID  . aspirin  300 mg Rectal Daily  . chlorhexidine  15 mL Mouth Rinse BID  . pantoprazole (PROTONIX) IV  40 mg Intravenous QHS   Ct Head Wo Contrast  01/08/2013  *RADIOLOGY REPORT*  Clinical Data: 79 year old female Code stroke.  Facial droop and slurred speech.  CT HEAD WITHOUT CONTRAST  Technique:  Contiguous axial images were obtained from the base of the skull through the vertex without contrast.  Comparison: 10/30/2009 and earlier.  Findings: Minor maxillary sinus mucosal thickening.  Other Visualized paranasal sinuses and mastoids are clear.  No acute orbit or scalp soft tissue findings. No acute osseous abnormality identified.  Calcified atherosclerosis at the skull base.  Scattered posterior circulation hypodensity involving both cortex and white matter. Confluent area of cytotoxic edema pattern hypodensity in the right occipital pole, but bilateral occipital and parietal involvement. Also suspect early subcortical white matter involvement in the left frontal lobe, most confluent area on image 19.  Hypodensity also in the central cerebellum left greater than right is new.  No definite acute intracranial hemorrhage.  No mass effect associated with the above.  No ventriculomegaly.  Basilar cisterns remain patent.,  No suspicious intracranial vascular hyperdensity.  IMPRESSION: Scattered gray and white matter edema.  Posterior circulation  predominant but also left MCA involvement suspected.  No associated mass effect or hemorrhage. Top differential considerations include subacute embolic infarcts and posterior reversible encephalopathy syndrome.  Critical Value/emergent results were called by telephone at the time of interpretation on 01/08/2013 at 0341 hours to Dr. Leonel Ramsay, who verbally acknowledged these results.   Original Report Authenticated By: Roselyn Reef, M.D.    Dg Chest Port 1 View  01/09/2013  *RADIOLOGY REPORT*  Clinical Data: Evaluate endotracheal tube placement  PORTABLE CHEST - 1 VIEW  Comparison: 01/09/2013  Findings: The endotracheal tube tip is stable in position approximately 3.5 cm above the carina.  There is a right IJ catheter with tip in the cavoatrial junction. There is a nasogastric tube which is coiled in the stomach.  Again noted are bilateral pleural effusions.  Atelectasis is noted in both lung bases.  Similar appearance of interstitial edema.  IMPRESSION:  1.  The endotracheal tube appears to be in good position. 2.  No change in pleural effusions and interstitial edema.   Original Report Authenticated By: Kerby Moors, M.D.    Dg Chest Port 1 View  01/09/2013  *RADIOLOGY REPORT*  Clinical Data: Check endotracheal tube position  PORTABLE CHEST - 1 VIEW  Comparison: None.  Findings: Endotracheal tube is not changed in position and is approximately 4 cm from carina.  NG tube extends the stomach. Right central venous line is unchanged.  Stable cardiac silhouette. Bilateral pleural effusions unchanged from prior.  No pneumothorax.  IMPRESSION:  1.  Endotracheal tube appears in good position.  2.  Stable bilateral pleural effusions.   Original Report Authenticated By: Suzy Bouchard, M.D.    Dg Chest Port 1 View  01/08/2013  *RADIOLOGY REPORT*  Clinical Data: Endotracheal tube placement.  PORTABLE CHEST - 1 VIEW  Comparison: 01/08/2013 14 22  Findings: Endotracheal tube enters the right mainstem bronchus. This  needs be retracted.  Right central line tip distal superior vena cava level.  Nasogastric tube curled upon itself with the tip at the level of the antrum.  Elevated right hemidiaphragm.  Limited evaluation right lung base.  No gross pneumothorax.  Pulmonary vascular congestion.  Pleural effusions not excluded.  IMPRESSION: Endotracheal tube enters the right mainstem bronchus.  This needs be retracted.  Elevated right hemidiaphragm.  Limited evaluation right lung base.  Pulmonary vascular congestion.  Pleural effusions not excluded.  Critical Value/emergent results were called by telephone at the time of interpretation on 01/08/2013 at 6:22 p.m. to [facility] the patient's nurse, who verbally acknowledged these results.   Original Report Authenticated By: Genia Del, M.D.    Dg Chest Port 1 View  01/08/2013  *RADIOLOGY REPORT*  Clinical Data: Central line placement.  PORTABLE CHEST - 1 VIEW  Comparison: 01/07/2013.  Findings: Right internal jugular catheter placed with the tip at the level of the right atrium.  To be at the level of the distal superior vena cava, this would need to be retracted by 3.5 cm.  No gross pneumothorax.  Elevated right hemidiaphragm with limited evaluation of the right lung base.  Pulmonary vascular congestion.  Heart size top normal.  IMPRESSION: Right internal jugular catheter placed with the tip at the level of the right atrium.  To be at the level of the distal superior vena cava, this would need to be retracted by 3.5 cm.  No gross pneumothorax.  Elevated right hemidiaphragm with limited evaluation of the right lung base.  Pulmonary vascular congestion.  This is a call report.   Original Report Authenticated By: Genia Del, M.D.    Dg Chest Port 1 View  01/07/2013  *RADIOLOGY REPORT*  Clinical Data: Tachycardia, hypoxia  PORTABLE CHEST - 1 VIEW  Comparison: 01/07/2013; 09/19/2012; 04/02/2012  Findings: Grossly unchanged cardiac silhouette and mediastinal contours gave an persistently  reduced lung volumes.  There is persistent mild elevation of right hemidiaphragm.  Grossly unchanged bilateral perihilar heterogeneous opacities.  No new focal airspace opacity.  No definite evidence of pulmonary edema. No definite pleural effusion or pneumothorax.  Unchanged bones.  IMPRESSION: Persistently reduced lung volumes with grossly unchanged perihilar and bibasilar opacities, atelectasis versus infiltrate.  Further evaluation with a PA and lateral chest radiograph may be obtained as clinically indicated.   Original Report Authenticated By: Jake Seats, MD    BMET    Component Value Date/Time   NA 144 01/09/2013 0405   K 3.6 01/09/2013 0405   CL 111 01/09/2013 0405   CO2 23 01/09/2013 0405   GLUCOSE 103* 01/09/2013 0405   BUN 59* 01/09/2013 0405   CREATININE 2.13* 01/09/2013 0405   CALCIUM 6.6* 01/09/2013 0405   GFRNONAA 21* 01/09/2013 0405   GFRAA 24* 01/09/2013 0405   CBC    Component Value Date/Time   WBC 10.6* 01/09/2013 0405   RBC 2.92* 01/09/2013 0405   HGB 9.0* 01/09/2013 0405   HCT 25.4* 01/09/2013 0405   PLT 114* 01/09/2013 0405   MCV 87.0 01/09/2013 0405   MCH 30.8 01/09/2013 0405   MCHC 35.4 01/09/2013 0405   RDW 14.8 01/09/2013 0405  LYMPHSABS 0.8 01/09/2013 0405   MONOABS 0.7 01/09/2013 0405   EOSABS 0.0 01/09/2013 0405   BASOSABS 0.0 01/09/2013 0405     Assessment: 1. AKI sec to contrast nephropathy and acute pancreatitis, improving 2. Gallstone pancreatitis 3. Acute CVA  Plan: 1.  Cont IV fluids but decrease rate to 75cc/hr 2. Daily Scr   Kimon Loewen T

## 2013-01-09 NOTE — Progress Notes (Signed)
Subjective: No acute events.  She was intubated to help control her pain and manage her respiratory status with the pain medications.  Objective: Vital signs in last 24 hours: Temp:  [98.7 F (37.1 C)-99.7 F (37.6 C)] 98.7 F (37.1 C) (05/07 0411) Pulse Rate:  [97-133] 97 (05/07 0700) Resp:  [16-26] 18 (05/07 0700) BP: (86-186)/(35-62) 129/36 mmHg (05/07 0700) SpO2:  [93 %-98 %] 97 % (05/07 0700) FiO2 (%):  [40 %-50 %] 40 % (05/07 0700) Weight:  [164 lb 3.9 oz (74.5 kg)] 164 lb 3.9 oz (74.5 kg) (05/07 0300) Last BM Date:  (Prior to admission)  Intake/Output from previous day: 05/06 0701 - 05/07 0700 In: 4441.3 [I.V.:3441.3; IV Piggyback:1000] Out: 1510 [Urine:1510] Intake/Output this shift:    General appearance: Intubated and sedated GI: Arousable with palpation of the abdomen  Lab Results:  Recent Labs  01/07/13 0210 01/08/13 0535 01/09/13 0405  WBC 20.9* 20.8* 10.6*  HGB 10.7* 10.3* 9.0*  HCT 30.5* 29.6* 25.4*  PLT 119* 103* 114*   BMET  Recent Labs  01/08/13 0535 01/08/13 1500 01/09/13 0405  NA 139 140 144  K 4.5 3.9 3.6  CL 109 109 111  CO2 17* 19 23  GLUCOSE 94 112* 103*  BUN 69* 64* 59*  CREATININE 3.12* 2.55* 2.13*  CALCIUM 7.2* 7.0* 6.6*   LFT  Recent Labs  01/08/13 1500 01/09/13 0405  PROT 5.7* 5.1*  ALBUMIN 1.9* 1.6*  AST 355* 185*  ALT 259* 185*  ALKPHOS 68 67  BILITOT 0.6 0.6  BILIDIR 0.2  --   IBILI 0.4  --    PT/INR  Recent Labs  01/07/13 0210  LABPROT 17.7*  INR 1.50*   Hepatitis Panel No results found for this basename: HEPBSAG, HCVAB, HEPAIGM, HEPBIGM,  in the last 72 hours C-Diff No results found for this basename: CDIFFTOX,  in the last 72 hours Fecal Lactopherrin No results found for this basename: FECLLACTOFRN,  in the last 72 hours  Studies/Results: Ct Head Wo Contrast  01/08/2013  *RADIOLOGY REPORT*  Clinical Data: 79 year old female Code stroke.  Facial droop and slurred speech.  CT HEAD WITHOUT CONTRAST   Technique:  Contiguous axial images were obtained from the base of the skull through the vertex without contrast.  Comparison: 10/30/2009 and earlier.  Findings: Minor maxillary sinus mucosal thickening.  Other Visualized paranasal sinuses and mastoids are clear.  No acute orbit or scalp soft tissue findings. No acute osseous abnormality identified.  Calcified atherosclerosis at the skull base.  Scattered posterior circulation hypodensity involving both cortex and white matter. Confluent area of cytotoxic edema pattern hypodensity in the right occipital pole, but bilateral occipital and parietal involvement. Also suspect early subcortical white matter involvement in the left frontal lobe, most confluent area on image 19.  Hypodensity also in the central cerebellum left greater than right is new.  No definite acute intracranial hemorrhage.  No mass effect associated with the above.  No ventriculomegaly.  Basilar cisterns remain patent.,  No suspicious intracranial vascular hyperdensity.  IMPRESSION: Scattered gray and white matter edema.  Posterior circulation predominant but also left MCA involvement suspected.  No associated mass effect or hemorrhage. Top differential considerations include subacute embolic infarcts and posterior reversible encephalopathy syndrome.  Critical Value/emergent results were called by telephone at the time of interpretation on 01/08/2013 at 0341 hours to Dr. Leonel Ramsay, who verbally acknowledged these results.   Original Report Authenticated By: Roselyn Reef, M.[name].    Dg Chest [facility]  01/09/2013  *RADIOLOGY REPORT*  Clinical Data: Evaluate endotracheal tube placement  PORTABLE CHEST - 1 VIEW  Comparison: 01/09/2013  Findings: The endotracheal tube tip is stable in position approximately 3.5 cm above the carina.  There is a right IJ catheter with tip in the cavoatrial junction. There is a nasogastric tube which is coiled in the stomach.  Again noted are bilateral pleural effusions.   Atelectasis is noted in both lung bases.  Similar appearance of interstitial edema.  IMPRESSION:  1.  The endotracheal tube appears to be in good position. 2.  No change in pleural effusions and interstitial edema.   Original Report Authenticated By: Kerby Moors, M.[name].    Dg Chest Port 1 View  01/09/2013  *RADIOLOGY REPORT*  Clinical Data: Check endotracheal tube position  PORTABLE CHEST - 1 VIEW  Comparison: None.  Findings: Endotracheal tube is not changed in position and is approximately 4 cm from carina.  NG tube extends the stomach. Right central venous line is unchanged.  Stable cardiac silhouette. Bilateral pleural effusions unchanged from prior.  No pneumothorax.  IMPRESSION:  1.  Endotracheal tube appears in good position.  2.  Stable bilateral pleural effusions.   Original Report Authenticated By: Suzy Bouchard, M.[name].    Dg Chest Port 1 View  01/08/2013  *RADIOLOGY REPORT*  Clinical Data: Endotracheal tube placement.  PORTABLE CHEST - 1 VIEW  Comparison: 01/08/2013 14 22  Findings: Endotracheal tube enters the right mainstem bronchus. This needs be retracted.  Right central line tip distal superior vena cava level.  Nasogastric tube curled upon itself with the tip at the level of the antrum.  Elevated right hemidiaphragm.  Limited evaluation right lung base.  No gross pneumothorax.  Pulmonary vascular congestion.  Pleural effusions not excluded.  IMPRESSION: Endotracheal tube enters the right mainstem bronchus.  This needs be retracted.  Elevated right hemidiaphragm.  Limited evaluation right lung base.  Pulmonary vascular congestion.  Pleural effusions not excluded.  Critical Value/emergent results were called by telephone at the time of interpretation on 01/08/2013 at 6:22 p.m. to [facility] the patient's nurse, who verbally acknowledged these results.   Original Report Authenticated By: Genia Del, M.[name].    Dg Chest Port 1 View  01/08/2013  *RADIOLOGY REPORT*  Clinical Data: Central line placement.   PORTABLE CHEST - 1 VIEW  Comparison: 01/07/2013.  Findings: Right internal jugular catheter placed with the tip at the level of the right atrium.  To be at the level of the distal superior vena cava, this would need to be retracted by 3.5 cm.  No gross pneumothorax.  Elevated right hemidiaphragm with limited evaluation of the right lung base.  Pulmonary vascular congestion.  Heart size top normal.  IMPRESSION: Right internal jugular catheter placed with the tip at the level of the right atrium.  To be at the level of the distal superior vena cava, this would need to be retracted by 3.5 cm.  No gross pneumothorax.  Elevated right hemidiaphragm with limited evaluation of the right lung base.  Pulmonary vascular congestion.  This is a call report.   Original Report Authenticated By: Genia Del, M.[name].    Dg Chest Port 1 View  01/07/2013  *RADIOLOGY REPORT*  Clinical Data: Tachycardia, hypoxia  PORTABLE CHEST - 1 VIEW  Comparison: 01/07/2013; 09/19/2012; 04/02/2012  Findings: Grossly unchanged cardiac silhouette and mediastinal contours gave an persistently reduced lung volumes.  There is persistent mild elevation of right hemidiaphragm.  Grossly unchanged bilateral perihilar heterogeneous opacities.  No  new focal airspace opacity.  No definite evidence of pulmonary edema. No definite pleural effusion or pneumothorax.  Unchanged bones.  IMPRESSION: Persistently reduced lung volumes with grossly unchanged perihilar and bibasilar opacities, atelectasis versus infiltrate.  Further evaluation with a PA and lateral chest radiograph may be obtained as clinically indicated.   Original Report Authenticated By: Jake Seats, MD     Medications:  Scheduled: . antiseptic oral rinse  15 mL Mouth Rinse QID  . aspirin  300 mg Rectal Daily  . chlorhexidine  15 mL Mouth Rinse BID  . pantoprazole (PROTONIX) IV  40 mg Intravenous QHS   Continuous: . sodium chloride 500 mL (01/09/13 0622)  . fentaNYL infusion INTRAVENOUS 75  mcg/hr (01/09/13 0747)  . midazolam (VERSED) infusion 1 mg/hr (01/09/13 0130)  .  sodium bicarbonate  infusion 1000 mL 125 mL/hr at 01/09/13 0033    Assessment/Plan: 1) Gallstone pancreatitis. 2) Abnormal liver enzymes.   The patient is stable.  Her transaminases have dropped suggesting that she passed a stone.    Plan: 1) Continue with current care. 2) Lap chole per surgery once acute issues have resolved/stabilized.  LOS: 2 days   [name] [name] 01/09/2013, 7:58 AM

## 2013-01-09 NOTE — Progress Notes (Signed)
Pt was unable to tolerate MRI- pt was very agitated pulling at ETT, pt was given 2mg  versed and BP dropped in the 80s before laying flat. RT and RN did not feel the pt would be able to tolerate at 68min procedure.

## 2013-01-10 ENCOUNTER — Inpatient Hospital Stay (HOSPITAL_COMMUNITY): Payer: Medicare Other

## 2013-01-10 LAB — COMPREHENSIVE METABOLIC PANEL
BUN: 45 mg/dL — ABNORMAL HIGH (ref 6–23)
CO2: 23 mEq/L (ref 19–32)
Calcium: 7.2 mg/dL — ABNORMAL LOW (ref 8.4–10.5)
GFR calc Af Amer: 37 mL/min — ABNORMAL LOW (ref >90)
GFR calc non Af Amer: 32 mL/min — ABNORMAL LOW (ref >90)
Glucose, Bld: 108 mg/dL — ABNORMAL HIGH (ref 70–99)
Total Protein: 5 g/dL — ABNORMAL LOW (ref 6.0–8.3)

## 2013-01-10 LAB — CBC WITH DIFFERENTIAL/PLATELET
Basophils Absolute: 0 10*3/uL (ref 0.0–0.1)
Basophils Relative: 0 % (ref 0–1)
HCT: 23.2 % — ABNORMAL LOW (ref 36.0–46.0)
Hemoglobin: 8.1 g/dL — ABNORMAL LOW (ref 12.0–15.0)
Lymphocytes Relative: 7 % — ABNORMAL LOW (ref 12–46)
MCHC: 34.9 g/dL (ref 30.0–36.0)
Monocytes Absolute: 1 10*3/uL (ref 0.1–1.0)
Monocytes Relative: 9 % (ref 3–12)
Neutro Abs: 9.4 10*3/uL — ABNORMAL HIGH (ref 1.7–7.7)
Neutrophils Relative %: 83 % — ABNORMAL HIGH (ref 43–77)
RDW: 15 % (ref 11.5–15.5)
WBC: 11.3 10*3/uL — ABNORMAL HIGH (ref 4.0–10.5)

## 2013-01-10 LAB — GLUCOSE, CAPILLARY
Glucose-Capillary: 106 mg/dL — ABNORMAL HIGH (ref 70–99)
Glucose-Capillary: 95 mg/dL (ref 70–99)
Glucose-Capillary: 97 mg/dL (ref 70–99)
Glucose-Capillary: 99 mg/dL (ref 70–99)

## 2013-01-10 MED ORDER — LEVOTHYROXINE SODIUM 100 MCG IV SOLR
44.0000 ug | Freq: Every day | INTRAVENOUS | Status: DC
  Administered 2013-01-10 – 2013-01-14 (×5): 44 ug via INTRAVENOUS
  Filled 2013-01-10 (×5): qty 5

## 2013-01-10 MED ORDER — ALBUTEROL SULFATE (5 MG/ML) 0.5% IN NEBU
2.5000 mg | INHALATION_SOLUTION | Freq: Four times a day (QID) | RESPIRATORY_TRACT | Status: DC
  Administered 2013-01-10: 2.5 mg via RESPIRATORY_TRACT

## 2013-01-10 MED ORDER — HEPARIN SODIUM (PORCINE) 5000 UNIT/ML IJ SOLN
5000.0000 [IU] | Freq: Three times a day (TID) | INTRAMUSCULAR | Status: DC
  Administered 2013-01-10 – 2013-01-23 (×39): 5000 [IU] via SUBCUTANEOUS
  Filled 2013-01-10 (×42): qty 1

## 2013-01-10 MED ORDER — FENTANYL CITRATE 0.05 MG/ML IJ SOLN
25.0000 ug | INTRAMUSCULAR | Status: DC | PRN
  Administered 2013-01-10 (×3): 50 ug via INTRAVENOUS
  Administered 2013-01-11: 25 ug via INTRAVENOUS
  Administered 2013-01-11: 50 ug via INTRAVENOUS
  Administered 2013-01-11: 25 ug via INTRAVENOUS
  Administered 2013-01-11: 50 ug via INTRAVENOUS
  Administered 2013-01-11: 25 ug via INTRAVENOUS
  Administered 2013-01-11 – 2013-01-12 (×3): 50 ug via INTRAVENOUS
  Administered 2013-01-12: 25 ug via INTRAVENOUS
  Administered 2013-01-12 – 2013-01-19 (×19): 50 ug via INTRAVENOUS
  Administered 2013-01-20: 25 ug via INTRAVENOUS
  Administered 2013-01-20: 50 ug via INTRAVENOUS
  Filled 2013-01-10 (×34): qty 2

## 2013-01-10 MED ORDER — ALBUTEROL SULFATE (5 MG/ML) 0.5% IN NEBU
2.5000 mg | INHALATION_SOLUTION | Freq: Four times a day (QID) | RESPIRATORY_TRACT | Status: DC | PRN
  Filled 2013-01-10: qty 0.5

## 2013-01-10 NOTE — Progress Notes (Signed)
Chaplain Note: Chaplain visited with pt and pt's family.  Pt was in bed, asleep, and did not communicate during this visit.  Pt's husband and daughter were at bedside.  Chaplain provided spiritual comfort, support, and prayer for pt and pt's family. Pt's family expressed appreciation for chaplain support.  Chaplain will follow up as needed.  01/10/13 1200  Clinical Encounter Type  Visited With Patient and family together  Visit Type Follow-up;Spiritual support  Referral From Nurse  Spiritual Encounters  Spiritual Needs Prayer;Emotional  Stress Factors  Patient Stress Factors Health changes;Loss of control;Major life changes  Family Stress Factors Major life changes  Jearld Lesch, Chaplain [phone]

## 2013-01-10 NOTE — Progress Notes (Signed)
PULMONARY  / CRITICAL CARE MEDICINE  Name: [name] [name] MRN: [id number] DOB: 1932-11-16    ADMISSION DATE:  01/07/2013 CONSULTATION DATE:  01/07/2013  REFERRING MD :  Transfer from [place]:  PCCM  CHIEF COMPLAINT:  Acute encephalopathy  BRIEF PATIENT DESCRIPTION: 79 yo without significant medical history admitted to [facility] with acute pancreatitis.  Imaging demonstrated dilated CBD and gallbladder stone.  Course was complicated by oliguria and acute encephalopathy.   SIGNIFICANT EVENTS / STUDIES:  5/4  Admitted to [facility] with acute pancreatitis 5/4  Abdominal US >>>  Gallbladder stones, CBD 4 mm 5/4  Abdomen CT >>>  Diffuse acute pancreatitis, CBD mildly dilated, no mass, dilated gallbladder with gallstones, no hydronephrosis 5/5  Transferred to [facility] 5/6 Head CT>>>Scattered gray and white matter edema. Posterior circulation predominant but also left MCA involvement suspected. No associated mass effect or hemorrhage. Top differential considerations include subacute embolic infarcts and posterior reversible encephalopathy syndrome. 5/7 patient intubated due to increasing WOB and patient in significant pain.   LINES / TUBES: 5/5 Urine catheter>>> 5/6 OETT>>> 5/6 CVC RIJ>>>  CULTURES: 5/5  Blood >>>NGTD  ANTIBIOTICS: None at this time  Subjective: pos balance, improved crt, did not tolerate MRI  VITAL SIGNS: Temp:  [97.9 F (36.6 C)-99.1 F (37.3 C)] 97.9 F (36.6 C) (05/08 0832) Pulse Rate:  [85-110] 91 (05/08 0900) Resp:  [11-24] 11 (05/08 0900) BP: (109-147)/(27-50) 122/41 mmHg (05/08 0900) SpO2:  [91 %-100 %] 93 % (05/08 0900) FiO2 (%):  [30 %-40 %] 30 % (05/08 0900) Weight:  [170 lb 6.7 oz (77.3 kg)] 170 lb 6.7 oz (77.3 kg) (05/08 0400) HEMODYNAMICS: CVP:  [7 mmHg-14 mmHg] 10 mmHg VENTILATOR SETTINGS: Vent Mode:  [-] CPAP FiO2 (%):  [30 %-40 %] 30 % Set Rate:  [18 bmp] 18 bmp Vt Set:  [420 mL] 420 mL PEEP:  [5 cmH20] 5  cmH20 Pressure Support:  [5 cmH20] 5 cmH20 Plateau Pressure:  [12 cmH20-20 cmH20] 16 cmH20 INTAKE / OUTPUT: Intake/Output     05/07 0701 - 05/08 0700 05/08 0701 - 05/09 0700   I.V. (mL/kg) 3323.1 (43) 435 (5.6)   NG/GT 120 20   IV Piggyback 316.7    Total Intake(mL/kg) 3759.8 (48.6) 455 (5.9)   Urine (mL/kg/hr) 1765 (1) 35 (0.2)   Total Output 1765 35   Net +1994.8 +420          PHYSICAL EXAMINATION: General:  Well nourished elderly white female Neuro:  Sedated rass 2-4, follows commands, weak rue remains without change HEENT:  PERRL. Pulses equal Cardiovascular:  s1 s2 Tachycardic mild, regular rate Lungs:  Bilateral diminished air entry, no w/r/r Abdomen:  No distension. Bowel sounds diminished, tender vol guard reduced Musculoskeletal:  No obvious deformities. no edema Skin:  Intact  LABS:  Recent Labs Lab 01/07/13 0210  01/07/13 2240 01/08/13 0207  01/08/13 0535 01/08/13 0947 01/08/13 1500 01/08/13 1946 01/09/13 0405 01/09/13 0513 01/10/13 0355  HGB 10.7*  --   --   --   --  10.3*  --   --   --  9.0*  --  8.1*  WBC 20.9*  --   --   --   --  20.8*  --   --   --  10.6*  --  11.3*  PLT 119*  --   --   --   --  103*  --   --   --  114*  --  133*  NA 138  --   --   --   --  139  --  140  --  144  --  144  K 4.5  --   --   --   --  4.5  --  3.9  --  3.6  --  3.7  CL 107  --   --   --   --  109  --  109  --  111  --  110  CO2 18*  --   --   --   --  17*  --  19  --  23  --  23  GLUCOSE 92  --   --   --   --  94  --  112*  --  103*  --  108*  BUN 66*  --   --   --   --  69*  --  64*  --  59*  --  45*  CREATININE 3.17*  --   --   --   --  3.12*  --  2.55*  --  2.13*  --  1.52*  CALCIUM 7.3*  --   --   --   --  7.2*  --  7.0*  --  6.6*  --  7.2*  MG 1.7  --   --   --   --  1.7  --   --   --  1.7  --   --   PHOS 4.7*  --   --   --   --   --   --   --   --  3.0  --   --   AST 219*  --   --   --   < >  --   --  355*  --  185*  --  PENDING  ALT 130*  --   --   --   < >  --    --  259*  --  185*  --  136*  ALKPHOS 59  --   --   --   < >  --   --  68  --  67  --  68  BILITOT 0.6  --   --   --   < >  --   --  0.6  --  0.6  --  0.5  PROT 5.9*  --   --   --   < >  --   --  5.7*  --  5.1*  --  5.0*  ALBUMIN 1.9*  --   --   --   < >  --   --  1.9*  --  1.6*  --  1.4*  APTT 32  --   --   --   --   --   --   --   --   --   --   --   INR 1.50*  --   --   --   --   --   --   --   --   --   --   --   LATICACIDVEN 2.2  --   --   --   --   --   --   --   --   --   --   --   TROPONINI  --   --  <0.30  --   --  <0.30  --   --   --   --   --   --   PROCALCITON  --   --   --  0.45  --   --   --   --   --   --   --   --   PHART  --   < >  --   --   --   --  7.342*  --  7.263*  --  7.392  --   PCO2ART  --   < >  --   --   --   --  27.5*  --  41.7  --  36.0  --   PO2ART  --   < >  --   --   --   --  71.0*  --  86.2  --  79.2*  --   < > = values in this interval not displayed.  Recent Labs Lab 01/09/13 1519 01/09/13 1942 01/09/13 2343 01/10/13 0422 01/10/13 0803  GLUCAP 87 85 99 106* 97    CXR: 5/8- atx, int prom increased, mild, ett wnl  ASSESSMENT / PLAN:  PULMONARY A: Acute Respiratory Failure-secondary to pain  Recently treated for pneumonia.  At risk for hypoxemic (fluid overload) and hypercarbic (opioids) respiratory failure. P:   Wean to extubate, on cpap 5 ps 5, goal 30 min  -with pancreatitis, sit upright -assess rsbi -pain improved -limit bolus  CARDIOVASCULAR A: SIRS Chest pain-resolved Echo 5/6 showed grade 1 diastolic dysfunction, LVEF 80% hypovolemic P:  Goal MAP>65-70 with cva concern, can have slow lowering of goals further CVP 10, hold further bolus  RENAL A:  Acute renal failure / AKI-secondary to contrast nephropathy and severe acute pancreatitis Oliguria and  Hypovolemia-improving, positive 5 L past 24 hrs Hypocalcemia r/o P:    bmet further rin am  Renal to sign off Reduce rate on volume with some increase int  changes  GASTROINTESTINAL A:  Cholelithiasis  Acute pancreatitis (likely gallstone). Passed? P:   Protonix is GI Px T fat 10 cc/hr tolerated, hold for weaning lft encouraging Lipase in am   HEMATOLOGIC A:  Thrombocytopenia (sirs related) Anemia dilutional P:  Cbc ni am  Add sub q heparin, plat wnl  Lower fluid rate  INFECTIOUS A:  No evidence of pancreatic abscess / NECROSIS Leukocytosis P:   Remains with fever  ENDOCRINE  A:  Hypothyroidism, pancreatitis, r/o sick euthyroid P:   TSH last wnl, repeat 0.375 Synthroid change to IV  (half) cbg tested  NEUROLOGIC A:  Acute encephalopathy subacute embolic infarcts questionable-carotid doppler normal R/o PRES P:   Fentanyl and versed  Neuro on board ASA 325 MRI/MRA re attemot in future wua  TODAY'S SUMMARY: Pt intubated and sedated. Renal function slightly improving and LFTs are going down, weano extubate  I have personally obtained a history, examined the patient, evaluated laboratory and imaging results, formulated the assessment and plan and placed orders.  CRITICAL CARE:  The patient is critically ill with multiple organ systems failure and requires high complexity decision making for assessment and support, frequent evaluation and titration of therapies, application of advanced monitoring technologies and extensive interpretation of multiple databases. Critical Care Time devoted to patient care services described in this note is 35 minutes.  Lavon Paganini. Titus Mould, MD, [place] Pgr: [facility]

## 2013-01-10 NOTE — Procedures (Signed)
Extubation Procedure Note  Patient Details:   Name: [name] [name] DOB: Dec 07, 1932 MRN: [id number]   Airway Documentation:  Airway 7.5 mm (Active)  Secured at (cm) 22 cm 01/10/2013  8:00 AM  Measured From Lips 01/10/2013  8:00 AM  Secured Location Left 01/10/2013  7:48 AM  Secured By [employer] 01/10/2013  7:48 AM  Tube Holder Repositioned Yes 01/10/2013  7:48 AM  Cuff Pressure (cm H2O) 25 cm H2O 01/10/2013  3:25 AM  Site Condition Dry 01/10/2013  7:48 AM    Evaluation  O2 sats: stable throughout Complications: No apparent complications Patient did tolerate procedure well. Bilateral Breath Sounds: Clear;Diminished Suctioning: Airway Yes  Pt extubated per MD order.  Pt placed on 4l [place].  RN at bedside. RT will continue to monitor.  Closson, Deetta Perla 01/10/2013, 9:59 AM

## 2013-01-10 NOTE — Progress Notes (Signed)
Stroke Team Progress Note  HISTORY [name] [name] is a 79 y.o. female who was admitted to [facility] 01/06/2013 for pancreatitis. Imaging demonstrated dilated CBD and gallbladder stone. Course was complicated by oliguria and acute encephalopathy.  She became confused yesterday 01/07/2013, but it was attributed to her infection and pain medications. She was transferred to [facility] (arrived 2130). Today 01/08/2013 at 0300, it was noted that she had an increase in her slurred speech and at that time some right sided facial weakness was also noted and therefore a code stroke was called. Patient was not a TPA candidate secondary to time last known well unknown - it was at least prior to transfer to [facility]. She was admitted for further evaluation and treatment.  SUBJECTIVE Patient unable to tolerate MRI.  OBJECTIVE Most recent Vital Signs: Filed Vitals:   01/10/13 1225 01/10/13 1300 01/10/13 1400 01/10/13 1454  BP:  136/51 143/41   Pulse:  106 94   Temp: 99 F (37.2 C)   98.9 F (37.2 C)  TempSrc: Oral   Oral  Resp:   14   Height:      Weight:      SpO2:  95% 97%    CBG (last 3)   Recent Labs  01/10/13 0422 01/10/13 0803 01/10/13 1213  GLUCAP 106* 97 99    IV Fluid Intake:   . sodium chloride 75 mL/hr at 01/10/13 0939    MEDICATIONS  . antiseptic oral rinse  15 mL Mouth Rinse QID  . aspirin  325 mg Oral Daily  . chlorhexidine  15 mL Mouth Rinse BID  . feeding supplement (VITAL AF 1.2 CAL)  1,000 mL Per Tube Q24H  . heparin subcutaneous  5,000 Units Subcutaneous Q8H  . levothyroxine  44 mcg Intravenous Daily  . pantoprazole (PROTONIX) IV  40 mg Intravenous QHS   PRN:  fentaNYL, labetalol, midazolam  Diet:  NPO  Activity:  Bedrest DVT Prophylaxis:  SCDs   CLINICALLY SIGNIFICANT STUDIES Basic Metabolic Panel:   Recent Labs Lab 01/07/13 0210 01/08/13 0535  01/09/13 0405 01/10/13 0355  NA 138 139  < > 144 144  K 4.5 4.5  < > 3.6 3.7  CL 107 109  < > 111 110  CO2 18* 17*   < > 23 23  GLUCOSE 92 94  < > 103* 108*  BUN 66* 69*  < > 59* 45*  CREATININE 3.17* 3.12*  < > 2.13* 1.52*  CALCIUM 7.3* 7.2*  < > 6.6* 7.2*  MG 1.7 1.7  --  1.7  --   PHOS 4.7*  --   --  3.0  --   < > = values in this interval not displayed. Liver Function Tests:   Recent Labs Lab 01/09/13 0405 01/10/13 0355  AST 185* 98*  ALT 185* 136*  ALKPHOS 67 68  BILITOT 0.6 0.5  PROT 5.1* 5.0*  ALBUMIN 1.6* 1.4*   CBC:   Recent Labs Lab 01/09/13 0405 01/10/13 0355  WBC 10.6* 11.3*  NEUTROABS 8.9* 9.4*  HGB 9.0* 8.1*  HCT 25.4* 23.2*  MCV 87.0 87.5  PLT 114* 133*   Coagulation:   Recent Labs Lab 01/07/13 0210  LABPROT 17.7*  INR 1.50*   Cardiac Enzymes:   Recent Labs Lab 01/07/13 2240 01/08/13 0535  TROPONINI <0.30 <0.30   Urinalysis:   Recent Labs Lab 01/07/13 2343  COLORURINE YELLOW  LABSPEC 1.028  PHURINE 5.0  GLUCOSEU NEGATIVE  HGBUR TRACE*  BILIRUBINUR SMALL*  KETONESUR 15*  PROTEINUR 30*  UROBILINOGEN 1.0  NITRITE NEGATIVE  LEUKOCYTESUR SMALL*   Lipid Panel    Component Value Date/Time   CHOL 110 01/08/2013 0535   TRIG 102 01/08/2013 0535   HDL 13* 01/08/2013 0535   CHOLHDL 8.5 01/08/2013 0535   VLDL 20 01/08/2013 0535   LDLCALC 77 01/08/2013 0535   HgbA1C  Lab Results  Component Value Date   HGBA1C 5.7* 01/08/2013    Urine Drug Screen:   No results found for this basename: labopia,  cocainscrnur,  labbenz,  amphetmu,  thcu,  labbarb    Alcohol Level: No results found for this basename: ETH,  in the last 168 hours  CT of the brain  01/08/2013  Scattered gray and white matter edema.  Posterior circulation predominant but also left MCA involvement suspected.  No associated mass effect or hemorrhage. Top differential considerations include subacute embolic infarcts and posterior reversible encephalopathy syndrome.    MRI of the brain    MRA of the brain    2D Echocardiogram  EF 80% There is severe septal thickening consistent with hypertrophic  cardiomyopathy.  Carotid Doppler  Bilateral: No evidence of hemodynamically significant internal carotid artery stenosis. Vertebral artery flow is antegrade.   CXR  01/09/2013   1. The endotracheal tube appears to be in good position. 2. No change in pleural effusions and interstitial edema.  EKG  sinus tachycardia.   Therapy Recommendations   Physical Exam   Pleasant middle aged caucasian lady not in distress.Awake alert. Afebrile. Head is nontraumatic. Neck is supple without bruit. Hearing is normal. Cardiac exam no murmur or gallop. Lungs are clear to auscultation. Distal pulses are well felt. Neurological Exam ;  Awake alert disoriented. Diminished attention, easy distractibility. Decreased recall. Follows one and few 2 step commands. Speech and language appear normal. Extraocular movements are full range without nystagmus. Blinks to threat bilaterally. Face is symmetric without weakness. Tongue is midline. Motor system exam reveals no upper or lower extremity drift however mild weakness of right grip and intrinsic hand muscles. Orbits left over right UE. Diminished fine finger movements on the right. Symmetric lower extremity strength without focal weakness. Deep tendon flexes are symmetric. Plantars are downgoing. Sensation is intact. There is mild impaired right finger-to-nose coordination. Gait was not tested.  ASSESSMENT [name] [name] is a 79 y.o. female presenting with acute confusion in the setting of pancreatitis. Imaging confirms bilateral hypodensities in the anterior and posterior circulation - embolic infarcts vs PRES given malignant hypertension prior to transfer here.  On aspirin 81 mg orally every day prior to admission. Now on aspirin suppository for secondary stroke prevention.   Malignant hypertension at [facility], leading to dx of possible PRES Hyperlipidemia, LDL 77, not on statin PTA, at goal LDL < 100  Hospital day # 3  TREATMENT/PLAN  Continue aspirin suppository  for secondary stroke prevention.  Ongoing aggressive BP management  MRI when medically stable--hopefully in am  Dr. Leonie Man discussed diagnosis, prognosis and plan of care with Dr. Titus Mould.  Regenia Skeeter. Owens Shark, [facility], [place], [facility] Pager: [phone] 01/10/2013 3:07 PM  I have personally obtained a history, examined the patient, evaluated imaging results, and formulated the assessment and plan of care. I agree with the above.  Antony Contras, MD

## 2013-01-10 NOTE — Progress Notes (Signed)
S: intubated O:BP 122/38  Pulse 86  Temp(Src) 98.9 F (37.2 C) (Oral)  Resp 17  Ht 5\' 3"  (1.6 m)  Wt 77.3 kg (170 lb 6.7 oz)  BMI 30.2 kg/m2  SpO2 95%  Intake/Output Summary (Last 24 hours) at 01/10/13 0754 Last data filed at 01/10/13 0600  Gross per 24 hour  Intake 3536.88 ml  Output   1765 ml  Net 1771.88 ml   Weight change: 2.8 kg (6 lb 2.8 oz) [id number]  Eyes open but not following commands CVS:sl tachy, reg Resp:basilar crackle [id number] BS appears less tender Ext:no edema NEURO:moves all extremities /purposeful   . antiseptic oral rinse  15 mL Mouth Rinse QID  . aspirin  325 mg Oral Daily  . chlorhexidine  15 mL Mouth Rinse BID  . feeding supplement (VITAL AF 1.2 CAL)  1,000 mL Per Tube Q24H  . pantoprazole (PROTONIX) IV  40 mg Intravenous QHS   Dg Chest Port 1 View  01/10/2013  *RADIOLOGY REPORT*  Clinical Data: 79 year old female with respiratory failure. Intubated.  PORTABLE CHEST - 1 VIEW  Comparison: 01/09/2013 and earlier.  Findings: AP portable semi upright view 0450 hours.  Stable endotracheal tube position just below the level of clavicles. Stable right IJ central line.  Enteric tube courses to the abdomen and across midline, tip likely in the distal stomach or proximal duodenum.  Mildly improved lung volumes.  Continued patchy confluent bibasilar opacity.  Probable small pleural effusions.  No pneumothorax or pulmonary edema.  Cardiac size and mediastinal contours are within normal limits.  Overall, ventilation not significantly changed.  IMPRESSION: 1. Stable lines and tubes. 2.  No significant interval change in ventilation.   Original Report Authenticated By: Roselyn Reef, M.D.    Dg Chest Port 1 View  01/09/2013  *RADIOLOGY REPORT*  Clinical Data: Evaluate endotracheal tube placement  PORTABLE CHEST - 1 VIEW  Comparison: 01/09/2013  Findings: The endotracheal tube tip is stable in position approximately 3.5 cm above the carina.  There is a right IJ catheter  with tip in the cavoatrial junction. There is a nasogastric tube which is coiled in the stomach.  Again noted are bilateral pleural effusions.  Atelectasis is noted in both lung bases.  Similar appearance of interstitial edema.  IMPRESSION:  1.  The endotracheal tube appears to be in good position. 2.  No change in pleural effusions and interstitial edema.   Original Report Authenticated By: Kerby Moors, M.D.    Dg Chest Port 1 View  01/09/2013  *RADIOLOGY REPORT*  Clinical Data: Check endotracheal tube position  PORTABLE CHEST - 1 VIEW  Comparison: None.  Findings: Endotracheal tube is not changed in position and is approximately 4 cm from carina.  NG tube extends the stomach. Right central venous line is unchanged.  Stable cardiac silhouette. Bilateral pleural effusions unchanged from prior.  No pneumothorax.  IMPRESSION:  1.  Endotracheal tube appears in good position.  2.  Stable bilateral pleural effusions.   Original Report Authenticated By: Suzy Bouchard, M.D.    Dg Chest Port 1 View  01/08/2013  *RADIOLOGY REPORT*  Clinical Data: Endotracheal tube placement.  PORTABLE CHEST - 1 VIEW  Comparison: 01/08/2013 14 22  Findings: Endotracheal tube enters the right mainstem bronchus. This needs be retracted.  Right central line tip distal superior vena cava level.  Nasogastric tube curled upon itself with the tip at the level of the antrum.  Elevated right hemidiaphragm.  Limited evaluation right lung base.  No gross  pneumothorax.  Pulmonary vascular congestion.  Pleural effusions not excluded.  IMPRESSION: Endotracheal tube enters the right mainstem bronchus.  This needs be retracted.  Elevated right hemidiaphragm.  Limited evaluation right lung base.  Pulmonary vascular congestion.  Pleural effusions not excluded.  Critical Value/emergent results were called by telephone at the time of interpretation on 01/08/2013 at 6:22 p.m. to [facility] the patient's nurse, who verbally acknowledged these results.    Original Report Authenticated By: Genia Del, M.D.    Dg Chest Port 1 View  01/08/2013  *RADIOLOGY REPORT*  Clinical Data: Central line placement.  PORTABLE CHEST - 1 VIEW  Comparison: 01/07/2013.  Findings: Right internal jugular catheter placed with the tip at the level of the right atrium.  To be at the level of the distal superior vena cava, this would need to be retracted by 3.5 cm.  No gross pneumothorax.  Elevated right hemidiaphragm with limited evaluation of the right lung base.  Pulmonary vascular congestion.  Heart size top normal.  IMPRESSION: Right internal jugular catheter placed with the tip at the level of the right atrium.  To be at the level of the distal superior vena cava, this would need to be retracted by 3.5 cm.  No gross pneumothorax.  Elevated right hemidiaphragm with limited evaluation of the right lung base.  Pulmonary vascular congestion.  This is a call report.   Original Report Authenticated By: Genia Del, M.D.    Dg Abd Portable 1v  01/09/2013  *RADIOLOGY REPORT*  Clinical Data: OG tube placement.  PORTABLE ABDOMEN - 1 VIEW  Comparison: None.  Findings: The tip of the OG tube is in the distal antrum just proximal to the pylorus. The injected contrast is in the fundus of the stomach extending into the body of the stomach as well as some contrast in the distal esophagus.  IMPRESSION: Tip of the NG tube is in the distal antrum.  Bowel gas pattern is normal.  Calcified gallstone.   Original Report Authenticated By: Lorriane Shire, M.D.    BMET    Component Value Date/Time   NA 144 01/10/2013 0355   K 3.7 01/10/2013 0355   CL 110 01/10/2013 0355   CO2 23 01/10/2013 0355   GLUCOSE 108* 01/10/2013 0355   BUN 45* 01/10/2013 0355   CREATININE 1.52* 01/10/2013 0355   CALCIUM 7.2* 01/10/2013 0355   GFRNONAA 32* 01/10/2013 0355   GFRAA 37* 01/10/2013 0355   CBC    Component Value Date/Time   WBC 11.3* 01/10/2013 0355   RBC 2.65* 01/10/2013 0355   HGB 8.1* 01/10/2013 0355   HCT 23.2* 01/10/2013  0355   PLT 133* 01/10/2013 0355   MCV 87.5 01/10/2013 0355   MCH 30.6 01/10/2013 0355   MCHC 34.9 01/10/2013 0355   RDW 15.0 01/10/2013 0355   LYMPHSABS 0.8 01/10/2013 0355   MONOABS 1.0 01/10/2013 0355   EOSABS 0.1 01/10/2013 0355   BASOSABS 0.0 01/10/2013 0355     Assessment: 1. AKI sec to contrast nephropathy and acute pancreatitis, improving 2. Gallstone pancreatitis 3. Acute CVA  Plan: 1. Renal fx back to baseline.  I will sign off.  Call if further renal issues.   Demarrio Menges T

## 2013-01-10 NOTE — Progress Notes (Signed)
Subjective: No acute events.  Extubated this AM.    Objective: Vital signs in last 24 hours: Temp:  [97.9 F (36.6 C)-99 F (37.2 C)] 99 F (37.2 C) (05/08 1225) Pulse Rate:  [85-110] 106 (05/08 1300) Resp:  [11-24] 13 (05/08 1000) BP: (109-146)/(34-52) 136/51 mmHg (05/08 1300) SpO2:  [91 %-100 %] 95 % (05/08 1300) FiO2 (%):  [30 %-40 %] 30 % (05/08 0900) Weight:  [170 lb 6.7 oz (77.3 kg)] 170 lb 6.7 oz (77.3 kg) (05/08 0400) Last BM Date:  (Prior to admission)  Intake/Output from previous day: 05/07 0701 - 05/08 0700 In: 3759.8 [I.V.:3323.1; NG/GT:120; IV Piggyback:316.7] Out: 1765 [Urine:1765] Intake/Output this shift: Total I/O In: 787.5 [I.V.:767.5; NG/GT:20] Out: 410 [Urine:410]  General appearance: uncomfortable appearing, nonverbal GI: tender to mild palpation  Lab Results:  Recent Labs  01/08/13 0535 01/09/13 0405 01/10/13 0355  WBC 20.8* 10.6* 11.3*  HGB 10.3* 9.0* 8.1*  HCT 29.6* 25.4* 23.2*  PLT 103* 114* 133*   BMET  Recent Labs  01/08/13 1500 01/09/13 0405 01/10/13 0355  NA 140 144 144  K 3.9 3.6 3.7  CL 109 111 110  CO2 19 23 23   GLUCOSE 112* 103* 108*  BUN 64* 59* 45*  CREATININE 2.55* 2.13* 1.52*  CALCIUM 7.0* 6.6* 7.2*   LFT  Recent Labs  01/08/13 1500  01/10/13 0355  PROT 5.7*  < > 5.0*  ALBUMIN 1.9*  < > 1.4*  AST 355*  < > 98*  ALT 259*  < > 136*  ALKPHOS 68  < > 68  BILITOT 0.6  < > 0.5  BILIDIR 0.2  --   --   IBILI 0.4  --   --   < > = values in this interval not displayed. PT/INR No results found for this basename: LABPROT, INR,  in the last 72 hours Hepatitis Panel No results found for this basename: HEPBSAG, HCVAB, HEPAIGM, HEPBIGM,  in the last 72 hours C-Diff No results found for this basename: CDIFFTOX,  in the last 72 hours Fecal Lactopherrin No results found for this basename: FECLLACTOFRN,  in the last 72 hours  Studies/Results: Dg Chest Port 1 View  01/10/2013  *RADIOLOGY REPORT*  Clinical Data:  79 year old female with respiratory failure. Intubated.  PORTABLE CHEST - 1 VIEW  Comparison: 01/09/2013 and earlier.  Findings: AP portable semi upright view 0450 hours.  Stable endotracheal tube position just below the level of clavicles. Stable right IJ central line.  Enteric tube courses to the abdomen and across midline, tip likely in the distal stomach or proximal duodenum.  Mildly improved lung volumes.  Continued patchy confluent bibasilar opacity.  Probable small pleural effusions.  No pneumothorax or pulmonary edema.  Cardiac size and mediastinal contours are within normal limits.  Overall, ventilation not significantly changed.  IMPRESSION: 1. Stable lines and tubes. 2.  No significant interval change in ventilation.   Original Report Authenticated By: Roselyn Reef, M.D.    Dg Chest Port 1 View  01/09/2013  *RADIOLOGY REPORT*  Clinical Data: Evaluate endotracheal tube placement  PORTABLE CHEST - 1 VIEW  Comparison: 01/09/2013  Findings: The endotracheal tube tip is stable in position approximately 3.5 cm above the carina.  There is a right IJ catheter with tip in the cavoatrial junction. There is a nasogastric tube which is coiled in the stomach.  Again noted are bilateral pleural effusions.  Atelectasis is noted in both lung bases.  Similar appearance of interstitial edema.  IMPRESSION:  1.  The endotracheal tube appears to be in good position. 2.  No change in pleural effusions and interstitial edema.   Original Report Authenticated By: Kerby Moors, M.D.    Dg Chest Port 1 View  01/09/2013  *RADIOLOGY REPORT*  Clinical Data: Check endotracheal tube position  PORTABLE CHEST - 1 VIEW  Comparison: None.  Findings: Endotracheal tube is not changed in position and is approximately 4 cm from carina.  NG tube extends the stomach. Right central venous line is unchanged.  Stable cardiac silhouette. Bilateral pleural effusions unchanged from prior.  No pneumothorax.  IMPRESSION:  1.  Endotracheal tube  appears in good position.  2.  Stable bilateral pleural effusions.   Original Report Authenticated By: Suzy Bouchard, M.D.    Dg Chest Port 1 View  01/08/2013  *RADIOLOGY REPORT*  Clinical Data: Endotracheal tube placement.  PORTABLE CHEST - 1 VIEW  Comparison: 01/08/2013 14 22  Findings: Endotracheal tube enters the right mainstem bronchus. This needs be retracted.  Right central line tip distal superior vena cava level.  Nasogastric tube curled upon itself with the tip at the level of the antrum.  Elevated right hemidiaphragm.  Limited evaluation right lung base.  No gross pneumothorax.  Pulmonary vascular congestion.  Pleural effusions not excluded.  IMPRESSION: Endotracheal tube enters the right mainstem bronchus.  This needs be retracted.  Elevated right hemidiaphragm.  Limited evaluation right lung base.  Pulmonary vascular congestion.  Pleural effusions not excluded.  Critical Value/emergent results were called by telephone at the time of interpretation on 01/08/2013 at 6:22 p.m. to [facility] the patient's nurse, who verbally acknowledged these results.   Original Report Authenticated By: Genia Del, M.D.    Dg Chest Port 1 View  01/08/2013  *RADIOLOGY REPORT*  Clinical Data: Central line placement.  PORTABLE CHEST - 1 VIEW  Comparison: 01/07/2013.  Findings: Right internal jugular catheter placed with the tip at the level of the right atrium.  To be at the level of the distal superior vena cava, this would need to be retracted by 3.5 cm.  No gross pneumothorax.  Elevated right hemidiaphragm with limited evaluation of the right lung base.  Pulmonary vascular congestion.  Heart size top normal.  IMPRESSION: Right internal jugular catheter placed with the tip at the level of the right atrium.  To be at the level of the distal superior vena cava, this would need to be retracted by 3.5 cm.  No gross pneumothorax.  Elevated right hemidiaphragm with limited evaluation of the right lung base.  Pulmonary vascular  congestion.  This is a call report.   Original Report Authenticated By: Genia Del, M.D.    Dg Abd Portable 1v  01/09/2013  *RADIOLOGY REPORT*  Clinical Data: OG tube placement.  PORTABLE ABDOMEN - 1 VIEW  Comparison: None.  Findings: The tip of the OG tube is in the distal antrum just proximal to the pylorus. The injected contrast is in the fundus of the stomach extending into the body of the stomach as well as some contrast in the distal esophagus.  IMPRESSION: Tip of the NG tube is in the distal antrum.  Bowel gas pattern is normal.  Calcified gallstone.   Original Report Authenticated By: Lorriane Shire, M.D.     Medications:  Scheduled: . antiseptic oral rinse  15 mL Mouth Rinse QID  . aspirin  325 mg Oral Daily  . chlorhexidine  15 mL Mouth Rinse BID  . feeding supplement (VITAL AF 1.2 CAL)  1,000 mL Per Tube  Q24H  . heparin subcutaneous  5,000 Units Subcutaneous Q8H  . levothyroxine  44 mcg Intravenous Daily  . pantoprazole (PROTONIX) IV  40 mg Intravenous QHS   Continuous: . sodium chloride 75 mL/hr at 01/10/13 [id number]    Assessment/Plan: 1) Gallstone pancreatitis. 2) CVA. 3) Oliguria.   The patient appears to be stable at this time.  I do believe she passed a gallstone as her transaminases have dropped.  She is still very tender with palpation.  No further GI intervention required at this time.  Plan: 1) Continue with supportive care.  2) Pain control. 3) Signing off.  Call with any questions.  LOS: 3 days   Kupono Marling D 01/10/2013, 1:41 PM

## 2013-01-10 NOTE — Progress Notes (Signed)
Noticed patient to be restless,holding lower abdomen. Patient has not voided since foley catheter during the day shift approximately 1830. Bladder scan showed 365ml. Dr. Earnest Conroy notified via [place]. Order for in abd out catheter. In and out catheter done. 370ml removed from bladder. Amber, no odor. Post catheterization patient is calm. Vss, will continue to monitor patient

## 2013-01-11 ENCOUNTER — Inpatient Hospital Stay (HOSPITAL_COMMUNITY): Payer: Medicare Other

## 2013-01-11 ENCOUNTER — Encounter (HOSPITAL_COMMUNITY): Payer: Self-pay | Admitting: *Deleted

## 2013-01-11 DIAGNOSIS — I6350 Cerebral infarction due to unspecified occlusion or stenosis of unspecified cerebral artery: Secondary | ICD-10-CM

## 2013-01-11 LAB — URINALYSIS, ROUTINE W REFLEX MICROSCOPIC
Bilirubin Urine: NEGATIVE
Leukocytes, UA: NEGATIVE
Nitrite: NEGATIVE
Specific Gravity, Urine: 1.016 (ref 1.005–1.030)
Urobilinogen, UA: 0.2 mg/dL (ref 0.0–1.0)
pH: 5.5 (ref 5.0–8.0)

## 2013-01-11 LAB — CBC WITH DIFFERENTIAL/PLATELET
Eosinophils Absolute: 0 10*3/uL (ref 0.0–0.7)
Eosinophils Relative: 0 % (ref 0–5)
HCT: 23.8 % — ABNORMAL LOW (ref 36.0–46.0)
Lymphocytes Relative: 8 % — ABNORMAL LOW (ref 12–46)
Lymphs Abs: 0.7 10*3/uL (ref 0.7–4.0)
MCH: 30.6 pg (ref 26.0–34.0)
MCV: 88.8 fL (ref 78.0–100.0)
Monocytes Absolute: 1.3 10*3/uL — ABNORMAL HIGH (ref 0.1–1.0)
Platelets: 159 10*3/uL (ref 150–400)
RBC: 2.68 MIL/uL — ABNORMAL LOW (ref 3.87–5.11)
WBC: 9.9 10*3/uL (ref 4.0–10.5)

## 2013-01-11 LAB — GLUCOSE, CAPILLARY: Glucose-Capillary: 109 mg/dL — ABNORMAL HIGH (ref 70–99)

## 2013-01-11 LAB — BASIC METABOLIC PANEL
CO2: 23 mEq/L (ref 19–32)
Calcium: 7.9 mg/dL — ABNORMAL LOW (ref 8.4–10.5)
Chloride: 109 mEq/L (ref 96–112)
Creatinine, Ser: 1.23 mg/dL — ABNORMAL HIGH (ref 0.50–1.10)
Glucose, Bld: 103 mg/dL — ABNORMAL HIGH (ref 70–99)
Sodium: 145 mEq/L (ref 135–145)

## 2013-01-11 LAB — URINE MICROSCOPIC-ADD ON

## 2013-01-11 LAB — LIPASE, BLOOD: Lipase: 52 U/L (ref 11–59)

## 2013-01-11 MED ORDER — POTASSIUM CHLORIDE 10 MEQ/100ML IV SOLN: 10.0000 meq | INTRAVENOUS | Status: DC

## 2013-01-11 MED ORDER — FUROSEMIDE 10 MG/ML IJ SOLN
40.0000 mg | Freq: Two times a day (BID) | INTRAMUSCULAR | Status: AC
  Administered 2013-01-11 – 2013-01-12 (×4): 40 mg via INTRAVENOUS
  Filled 2013-01-11 (×4): qty 4

## 2013-01-11 MED ORDER — POTASSIUM CHLORIDE 10 MEQ/50ML IV SOLN
10.0000 meq | INTRAVENOUS | Status: AC
  Administered 2013-01-11 (×2): 10 meq via INTRAVENOUS

## 2013-01-11 MED ORDER — POTASSIUM CHLORIDE 10 MEQ/50ML IV SOLN
INTRAVENOUS | Status: AC
  Filled 2013-01-11: qty 100

## 2013-01-11 NOTE — Progress Notes (Signed)
UR Completed.  [name] [name] [id number] 01/11/2013

## 2013-01-11 NOTE — Progress Notes (Signed)
I talked to the daughter and explained that the MRI/MRA test could not be completed and did not reveal new findings compared to previous studies. Advised to discuss further plan with neurology and rounding team in am.

## 2013-01-11 NOTE — Progress Notes (Signed)
eLink Physician-Brief Progress Note Patient Name: [name] [name] DOB: [birth year] MRN: [id number]  Date of Service  01/11/2013   HPI/Events of Note   Registered nurse Loma Sousa calling electronic ICU. She reports that patient is retaining urine and unable to void. Previous temporary insertion of Foley catheter only provided temporary relief   eICU Interventions   instructed to place permanent Foley catheter    Intervention Category Intermediate Interventions: Other:  Micaela Stith 01/11/2013, 12:41 AM

## 2013-01-11 NOTE — Evaluation (Signed)
Occupational Therapy Evaluation Patient Details Name: [name] [name] MRN: [id number] DOB: [birth year] Today's Date: 01/11/2013 Time: [id number] OT Time Calculation (min): 30 min  OT Assessment / Plan / Recommendation Clinical Impression   This 79 y.o female, without significant medical history, transferred from [facility] with acute pancreatitis.  Pt. With development of oliguria and acute encephalopathy.  Head CT showed scattered gray and white matter edema Posterior circulation predominant but also left MCA involvement suspected. No associated mass effect or hemorrhage. Top differential considerations include subacute embolic infarcts and posterior reversible encephalopathy syndrome.  Pt intubated 5/7 and extubated 5/8.  Pt  Presents to OT with Rt. Hemiplegia, Rt. Neglect, impaired cognition (not following commands and lethargic), poor trunk control.  She will benefit from continued OT to maximize safety and independence with BADLs and functional mobility.  Depending on progress, she may benefit from CIR; however, if cognition doesn't improve significantly to allow her to participate, she would then likely require SNF.  Will follow     OT Assessment  Patient needs continued OT Services    Follow Up Recommendations  CIR;Supervision/Assistance - 24 hour (CIR depending on progress)    Barriers to Discharge Decreased caregiver support uncertain if family can provide level of care - will depend on progress  Equipment Recommendations  Other (comment) (TBD)    Recommendations for Other Services Rehab consult (depending on progress)  Frequency  Min 3X/week    Precautions / Restrictions Precautions Precautions: Fall Restrictions Weight Bearing Restrictions: No       ADL  Eating/Feeding: NPO Grooming: +1 Total assistance;Wash/dry hands;Wash/dry face;Brushing hair Where Assessed - Grooming: Supported sitting Upper Body Bathing: +1 Total assistance Where Assessed - Upper Body Bathing:  Supported sitting Lower Body Bathing: +1 Total assistance Where Assessed - Lower Body Bathing: Supine, head of bed up;Rolling right and/or left Upper Body Dressing: +1 Total assistance Where Assessed - Upper Body Dressing: Supported sitting Lower Body Dressing: +1 Total assistance Where Assessed - Lower Body Dressing: Supine, head of bed up;Rolling right and/or left Toilet Transfer: +1 Total assistance (unable) Toileting - Clothing Manipulation and Hygiene: +1 Total assistance Where Assessed - Toileting Clothing Manipulation and Hygiene: Rolling right and/or left Transfers/Ambulation Related to ADLs: n/a ADL Comments: Pt unable to follow one step commands to engage in ADL activity    OT Diagnosis: Generalized weakness;Cognitive deficits;Disturbance of vision;Hemiplegia non-dominant side  OT Problem List: Decreased strength;Decreased range of motion;Decreased activity tolerance;Impaired balance (sitting and/or standing);Impaired vision/perception;Decreased coordination;Decreased cognition;Decreased knowledge of use of DME or AE;Impaired sensation;Impaired tone;Obesity;Impaired UE functional use OT Treatment Interventions: Self-care/ADL training;Neuromuscular education;DME and/or AE instruction;Splinting;Therapeutic activities;Cognitive remediation/compensation;Visual/perceptual remediation/compensation;Patient/family education;Balance training   OT Goals Acute Rehab OT Goals OT Goal Formulation: With family Time For Goal Achievement: 01/25/13 Potential to Achieve Goals: Good ADL Goals Pt Will Perform Grooming: with mod assist;Sitting, chair;Sitting, edge of bed;Supported (will comb hair) ADL Goal: Grooming - Progress: Goal set today Pt Will Transfer to Toilet: with max assist;3-in-1;Squat pivot transfer;Stand pivot transfer ADL Goal: Toilet Transfer - Progress: Goal set today Additional ADL Goal #1: Pt will sustain attention to task x 4 mins ADL Goal: Additional Goal #1 - Progress: Goal  set today Additional ADL Goal #2: Pt will follow one step commands consistently ADL Goal: Additional Goal #2 - Progress: Goal set today Arm Goals Additional Arm Goal #1: Family will be independent with PROM Rt. UE Arm Goal: Additional Goal #1 - Progress: Goal set today Miscellaneous OT Goals Miscellaneous OT Goal #1: Pt will sit EOB x  10 mins with min A/facilitation OT Goal: Miscellaneous Goal #1 - Progress: Goal set today Miscellaneous OT Goal #2: Pt will use Rt. UE as a stabilizer with mod facilitation OT Goal: Miscellaneous Goal #2 - Progress: Goal set today Miscellaneous OT Goal #3: Pt will locate items on Rt. during simple grooming task with min verbal cues OT Goal: Miscellaneous Goal #3 - Progress: Goal set today  Visit Information  Last OT Received On: 01/11/13 Assistance Needed: +2 PT/OT Co-Evaluation/Treatment: Yes    Subjective Data  Subjective: Pt non verbal Patient Stated Goal: Pt unable.  Family hopes she will return to baseline   Prior [place] Lives With: Spouse Available Help at Discharge: Family Type of Home: House Home Access: Stairs to enter Technical brewer of Steps: 4 Entrance Stairs-Rails: None Home Layout: One level Bathroom Shower/Tub: Tub/shower unit;Walk-in shower Bathroom Toilet: Handicapped height Home Adaptive Equipment: Wheelchair - manual Prior Function Level of Independence: Independent Able to Take Stairs?: Yes Driving: Yes Vocation: Retired Corporate investment banker: Other (comment) (not following commands, non verbal) Dominant Hand:  (Family did not confirm)         Vision/Perception Vision - Assessment Vision Assessment: Vision not tested Additional Comments: Pt unable to participate in visual testing.  Pt. with head/neck turned to Lt. when seated; gaze to lt and to midline.  Does blink to threat on Rt. Perception Perception: Impaired Inattention/Neglect: Other (comment) (appears to have  questionable Rt. body/spatial neglect)   Cognition  Cognition Arousal/Alertness: Lethargic Behavior During Therapy: Flat affect Overall Cognitive Status: Impaired/Different from baseline Area of Impairment: Attention;Following commands Current Attention Level: Focused Following Commands:  (Follows no commands) General Comments: Pt does not follow commands.  Will open eyes briefly to stimuli    Extremity/Trunk Assessment Right Upper Extremity Assessment RUE ROM/Strength/Tone: Deficits RUE ROM/Strength/Tone Deficits: Rt UE appears flaccid; however, small amount of spontaneous elbow flexion noted when moving pt to [facility] RUE Coordination: Deficits RUE Coordination Deficits: non functional Left Upper Extremity Assessment LUE ROM/Strength/Tone: Unable to fully assess (Pt moving spontaneously) Trunk Assessment Trunk Assessment: Other exceptions Trunk Exceptions: Pt maintains posterior pelvic tilt with thoracic flexion.  Rt. lateral flexion.  Pt. pushes to Lt and posteriorly     Mobility Bed Mobility Bed Mobility: Supine to Sit;Sitting - Scoot to Edge of Bed Supine to Sit: 1: +2 Total assist;HOB flat Supine to Sit: Patient Percentage: 0% Sitting - Scoot to Edge of Bed: 1: +2 Total assist Sitting - Scoot to Edge of Bed: Patient Percentage: 0% Details for Bed Mobility Assistance: Total +2 assist, minimal response to movement (eyes open, minimal vocalizations)     Exercise     Balance Balance Balance Assessed: Yes Static Sitting Balance Static Sitting - Balance Support: Left upper extremity supported;Bilateral upper extremity supported;Feet supported Static Sitting - Level of Assistance: 1: +2 Total assist Static Sitting - Comment/# of Minutes: Pt pushes Rt. and posteriorly.  when Lt. UE placed in and held in her lap, pushing decreases, but continues to require total A for balance (14 mins)   End of Session OT - End of Session Activity Tolerance: Patient limited by fatigue Patient  left: in bed;with call bell/phone within reach Nurse Communication: Mobility status  [place], Isma Tietje M 01/11/2013, 7:46 PM

## 2013-01-11 NOTE — Progress Notes (Signed)
Stroke Team Progress Note  HISTORY [name] [name] is a 79 y.o. female who was admitted to [facility] 01/06/2013 for pancreatitis. Imaging demonstrated dilated CBD and gallbladder stone. Course was complicated by oliguria and acute encephalopathy.  She became confused yesterday 01/07/2013, but it was attributed to her infection and pain medications. She was transferred to [facility] (arrived 2130). Today 01/08/2013 at 0300, it was noted that she had an increase in her slurred speech and at that time some right sided facial weakness was also noted and therefore a code stroke was called. Patient was not a TPA candidate secondary to time last known well unknown - it was at least prior to transfer to [facility]. She was admitted for further evaluation and treatment.  SUBJECTIVE  Sitting up in bed, still aphasic.   OBJECTIVE Most recent Vital Signs: Filed Vitals:   01/11/13 0300 01/11/13 0400 01/11/13 0459 01/11/13 0500  BP: 180/71 147/49  172/69  Pulse: 120 105  110  Temp:  98.8 F (37.1 C)    TempSrc:  Oral    Resp: 29 37  27  Height:      Weight:   76.5 kg (168 lb 10.4 oz)   SpO2: 91% 93%  94%   CBG (last 3)   Recent Labs  01/10/13 2004 01/10/13 2353 01/11/13 0354  GLUCAP 100* 109* 97    IV Fluid Intake:   . sodium chloride 75 mL/hr at 01/10/13 0939    MEDICATIONS  . antiseptic oral rinse  15 mL Mouth Rinse QID  . aspirin  325 mg Oral [name]  . chlorhexidine  15 mL Mouth Rinse BID  . feeding supplement (VITAL AF 1.2 CAL)  1,000 mL Per Tube Q24H  . heparin subcutaneous  5,000 Units Subcutaneous Q8H  . levothyroxine  44 mcg Intravenous [name]  . pantoprazole (PROTONIX) IV  40 mg Intravenous QHS   PRN:  albuterol, fentaNYL, labetalol, midazolam  Diet:  NPO  Activity:  Bedrest DVT Prophylaxis:  SCDs, heparin SQ   CLINICALLY SIGNIFICANT STUDIES Basic Metabolic Panel:   Recent Labs Lab 01/07/13 0210 01/08/13 0535  01/09/13 0405 01/10/13 0355 01/11/13 0500  NA 138 139  < > 144  144 145  K 4.5 4.5  < > 3.6 3.7 3.7  CL 107 109  < > 111 110 109  CO2 18* 17*  < > 23 23 23   GLUCOSE 92 94  < > 103* 108* 103*  BUN 66* 69*  < > 59* 45* 34*  CREATININE 3.17* 3.12*  < > 2.13* 1.52* 1.23*  CALCIUM 7.3* 7.2*  < > 6.6* 7.2* 7.9*  MG 1.7 1.7  --  1.7  --   --   PHOS 4.7*  --   --  3.0  --   --   < > = values in this interval not displayed. Liver Function Tests:   Recent Labs Lab 01/09/13 0405 01/10/13 0355  AST 185* 98*  ALT 185* 136*  ALKPHOS 67 68  BILITOT 0.6 0.5  PROT 5.1* 5.0*  ALBUMIN 1.6* 1.4*   CBC:   Recent Labs Lab 01/10/13 0355 01/11/13 0500  WBC 11.3* 9.9  NEUTROABS 9.4* 7.8*  HGB 8.1* 8.2*  HCT 23.2* 23.8*  MCV 87.5 88.8  PLT 133* 159   Coagulation:   Recent Labs Lab 01/07/13 0210  LABPROT 17.7*  INR 1.50*   Cardiac Enzymes:   Recent Labs Lab 01/07/13 2240 01/08/13 0535  TROPONINI <0.30 <0.30   Urinalysis:   Recent Labs  Lab 01/07/13 2343 01/11/13 0058  COLORURINE YELLOW YELLOW  LABSPEC 1.028 1.016  PHURINE 5.0 5.5  GLUCOSEU NEGATIVE NEGATIVE  HGBUR TRACE* MODERATE*  BILIRUBINUR SMALL* NEGATIVE  KETONESUR 15* 40*  PROTEINUR 30* 30*  UROBILINOGEN 1.0 0.2  NITRITE NEGATIVE NEGATIVE  LEUKOCYTESUR SMALL* NEGATIVE   Lipid Panel    Component Value Date/Time   CHOL 110 01/08/2013 0535   TRIG 102 01/08/2013 0535   HDL 13* 01/08/2013 0535   CHOLHDL 8.5 01/08/2013 0535   VLDL 20 01/08/2013 0535   LDLCALC 77 01/08/2013 0535   HgbA1C  Lab Results  Component Value Date   HGBA1C 5.7* 01/08/2013    Urine Drug Screen:   No results found for this basename: labopia,  cocainscrnur,  labbenz,  amphetmu,  thcu,  labbarb    Alcohol Level: No results found for this basename: ETH,  in the last 168 hours  CT of the brain  01/08/2013  Scattered gray and white matter edema.  Posterior circulation predominant but also left MCA involvement suspected.  No associated mass effect or hemorrhage. Top differential considerations include subacute  embolic infarcts and posterior reversible encephalopathy syndrome.    MRI of the brain    MRA of the brain    2D Echocardiogram  EF 80% There is severe septal thickening consistent with hypertrophic cardiomyopathy.  Carotid Doppler  Bilateral: No evidence of hemodynamically significant internal carotid artery stenosis. Vertebral artery flow is antegrade.   CXR  01/09/2013   1. The endotracheal tube appears to be in good position. 2. No change in pleural effusions and interstitial edema. 01/11/2013. Slightly increased pulmonary vascular congestion now bordering on mild interstitial edema  2. Interval extubation and removal of nasogastric tube  3. Otherwise similar appearance of small bilateral effusions, bibasilar atelectasis and right hemidiaphragm elevation   EKG  sinus tachycardia.   Therapy Recommendations   Physical Exam   Pleasant middle aged caucasian lady not in distress.Awake alert. Afebrile. Head is nontraumatic. Neck is supple without bruit. Hearing is normal. Cardiac exam no murmur or gallop. Lungs are clear to auscultation. Distal pulses are well felt. Neurological Exam ;  Awake alert globally aphasic Diminished attention, easy distractibility.   Follows only occasional midline commands.  Extraocular movements are full range without nystagmus. Blinks to threat bilaterally. Face is symmetric without weakness. Tongue is midline. Motor system exam reveals dense right hemiplegia with 0/5 right upper extremity and 1-2/5 right lower extremity strength. Purposeful antigravity movements on the left. Plantars are downgoing. Sensation is intact.  Gait was not tested.  ASSESSMENT [name] is a 79 y.o. female presenting with acute confusion in the setting of pancreatitis. Imaging confirms bilateral hypodensities in the anterior and posterior circulation - embolic infarcts likely. PRES  less likelygiven malignant hypertension prior to transfer here.  On aspirin 81 mg orally every day  prior to admission. Now on aspirin suppository for secondary stroke prevention.   Malignant hypertension at [facility], leading to dx of possible PRES Hyperlipidemia, LDL 77, not on statin PTA, at goal LDL < 100  Hospital day # 4  TREATMENT/PLAN  Continue aspirin suppository for secondary stroke prevention.  Ongoing aggressive BP management  MRI today if patient can tolerate lying flat (trial in unit before going downstairs)  Dr. Leonie Man discussed diagnosis, prognosis and plan of care with Dr. Titus Mould.  Regenia Skeeter. Owens Shark, [facility], MBA, [facility] Pager: [phone] 01/11/2013 8:10 AM  I have personally obtained a history, examined the patient, evaluated imaging  results, and formulated the assessment and plan of care. I agree with the above.   Antony Contras, MD

## 2013-01-11 NOTE — Progress Notes (Signed)
PULMONARY  / CRITICAL CARE MEDICINE  Name: [name] [name] MRN: [id number] DOB: 1933-01-23    ADMISSION DATE:  01/07/2013 CONSULTATION DATE:  01/07/2013  REFERRING MD :  Transfer from [place]:  PCCM  CHIEF COMPLAINT:  Acute encephalopathy  BRIEF PATIENT DESCRIPTION: 79 yo without significant medical history admitted to [facility] with acute pancreatitis.  Imaging demonstrated dilated CBD and gallbladder stone.  Course was complicated by oliguria and acute encephalopathy.   SIGNIFICANT EVENTS / STUDIES:  5/4  Admitted to [facility] with acute pancreatitis 5/4  Abdominal US >>>  Gallbladder stones, CBD 4 mm 5/4  Abdomen CT >>>  Diffuse acute pancreatitis, CBD mildly dilated, no mass, dilated gallbladder with gallstones, no hydronephrosis 5/5  Transferred to [facility] 5/6 Head CT>>>Scattered gray and white matter edema. Posterior circulation predominant but also left MCA involvement suspected. No associated mass effect or hemorrhage. Top differential considerations include subacute embolic infarcts and posterior reversible encephalopathy syndrome. 5/7 patient intubated due to increasing WOB and patient in significant pain.  5/8 extubated  LINES / TUBES: 5/5 Urine catheter>>>reinsterted needed 5.8>>> 5/6 OETT>>>5/8 5/6 CVC RIJ>>>5/9  CULTURES: 5/5  Blood >>>NGTD  ANTIBIOTICS: None at this time  Subjective: pos balance, improved crt, responds intermittently and continues to groan in pain  VITAL SIGNS: Temp:  [98.6 F (37 C)-99 F (37.2 C)] 98.6 F (37 C) (05/09 0800) Pulse Rate:  [85-120] 97 (05/09 0800) Resp:  [11-37] 30 (05/09 0800) BP: (116-186)/(41-86) 154/55 mmHg (05/09 0800) SpO2:  [91 %-100 %] 96 % (05/09 0800) FiO2 (%):  [30 %] 30 % (05/08 0900) Weight:  [168 lb 10.4 oz (76.5 kg)] 168 lb 10.4 oz (76.5 kg) (05/09 0459) HEMODYNAMICS:   VENTILATOR SETTINGS: Vent Mode:  [-]  FiO2 (%):  [30 %] 30 % INTAKE / OUTPUT: Intake/Output     05/08 0701 -  05/09 0700 05/09 0701 - 05/10 0700   I.V. (mL/kg) 2117.5 (27.7) 75 (1)   NG/GT 20    IV Piggyback     Total Intake(mL/kg) 2137.5 (27.9) 75 (1)   Urine (mL/kg/hr) 2020 (1.1) 200 (1.4)   Total Output 2020 200   Net +117.5 -125          PHYSICAL EXAMINATION: General:  Well nourished elderly white female Neuro: follos commands intermittentlyw HEENT:  PERRL. Pulses equal Cardiovascular:  s1 s2 Tachycardic mild, regular rate Lungs:  Bilateral diminished air entry, no w/r/r Abdomen:  No distension. Bowel sounds diminished, tender vol guard reduced Musculoskeletal:  No obvious deformities, edema RUE Skin:  Intact  LABS:  Recent Labs Lab 01/07/13 0210  01/07/13 2240 01/08/13 0207  01/08/13 0535 01/08/13 0947 01/08/13 1500 01/08/13 1946 01/09/13 0405 01/09/13 0513 01/10/13 0355 01/11/13 0500  HGB 10.7*  --   --   --   --  10.3*  --   --   --  9.0*  --  8.1* 8.2*  WBC 20.9*  --   --   --   --  20.8*  --   --   --  10.6*  --  11.3* 9.9  PLT 119*  --   --   --   --  103*  --   --   --  114*  --  133* 159  NA 138  --   --   --   --  139  --  140  --  144  --  144 145  K 4.5  --   --   --   --  4.5  --  3.9  --  3.6  --  3.7 3.7  CL 107  --   --   --   --  109  --  109  --  111  --  110 109  CO2 18*  --   --   --   --  17*  --  19  --  23  --  23 23  GLUCOSE 92  --   --   --   --  94  --  112*  --  103*  --  108* 103*  BUN 66*  --   --   --   --  69*  --  64*  --  59*  --  45* 34*  CREATININE 3.17*  --   --   --   --  3.12*  --  2.55*  --  2.13*  --  1.52* 1.23*  CALCIUM 7.3*  --   --   --   --  7.2*  --  7.0*  --  6.6*  --  7.2* 7.9*  MG 1.7  --   --   --   --  1.7  --   --   --  1.7  --   --   --   PHOS 4.7*  --   --   --   --   --   --   --   --  3.0  --   --   --   AST 219*  --   --   --   < >  --   --  355*  --  185*  --  98*  --   ALT 130*  --   --   --   < >  --   --  259*  --  185*  --  136*  --   ALKPHOS 59  --   --   --   < >  --   --  68  --  67  --  68  --   BILITOT  0.6  --   --   --   < >  --   --  0.6  --  0.6  --  0.5  --   PROT 5.9*  --   --   --   < >  --   --  5.7*  --  5.1*  --  5.0*  --   ALBUMIN 1.9*  --   --   --   < >  --   --  1.9*  --  1.6*  --  1.4*  --   APTT 32  --   --   --   --   --   --   --   --   --   --   --   --   INR 1.50*  --   --   --   --   --   --   --   --   --   --   --   --   LATICACIDVEN 2.2  --   --   --   --   --   --   --   --   --   --   --   --   TROPONINI  --   --  <0.30  --   --  <0.30  --   --   --   --   --   --   --  PROCALCITON  --   --   --  0.45  --   --   --   --   --   --   --   --   --   PHART  --   < >  --   --   --   --  7.342*  --  7.263*  --  7.392  --   --   PCO2ART  --   < >  --   --   --   --  27.5*  --  41.7  --  36.0  --   --   PO2ART  --   < >  --   --   --   --  71.0*  --  86.2  --  79.2*  --   --   < > = values in this interval not displayed.  Recent Labs Lab 01/10/13 1453 01/10/13 2004 01/10/13 2353 01/11/13 0354 01/11/13 0747  GLUCAP 95 100* 109* 97 95    CXR: 5/8- atx, int prom increased, mild, ett wnl 5/9-bilat effusions, right greater than left  ASSESSMENT / PLAN:  PULMONARY A: Acute Respiratory Failure-secondary to pain  Recently treated for pneumonia.  At risk for hypoxemic (fluid overload) and hypercarbic (opioids) respiratory failure. Effusions P:   -with pancreatitis, sit upright -pain improved -consider diuresis -IS if able  CARDIOVASCULAR A: SIRS Chest pain-resolved Echo 5/6 showed grade 1 diastolic dysfunction, LVEF 80% hypovolemic P:  Goal MAP>65-70 with cva concern, can have slow lowering of goals further to MAP 60  RENAL A:  Acute renal failure / AKI-secondary to contrast nephropathy and severe acute pancreatitis Oliguria and  Hypovolemia-improving, positive 5 L past 24 hrs Hypocalcemia Edema, effusion P:    Trend BMET, BUN/Creat continuing to improve  Lasix kvo k supp  GASTROINTESTINAL A:  Cholelithiasis  Acute pancreatitis (likely gallstone).  Passed? dysphagia P:   Protonix-GI Px lft encouraging Lipase WNL NG tube Concern for aspiration of feeds with swallow slp involvement  HEMATOLOGIC A:  Thrombocytopenia (sirs related)-resolved Anemia dilutional P:  Add sub q heparin today (plts WNL)  INFECTIOUS A:  No evidence of pancreatic abscess / NECROSIS Leukocytosis resolved P:   Fever resolved  ENDOCRINE  A:  Hypothyroidism, pancreatitis, r/o sick euthyroid P:   TSH last wnl, repeat 0.375 Synthroid  IV   cbg WNL  NEUROLOGIC A:  Acute encephalopathy subacute embolic infarcts questionable-carotid doppler normal R/o PRES, favor left CVA P:   Neuro on board ASA 325 MRI/MRA re attempt today if can lay flat  Marlyce Huge, PA-S  To sdu, [facility]   I have personally obtained a history, examined the patient, evaluated laboratory and imaging results, formulated the assessment and plan and placed orders.  Lavon Paganini. Titus Mould, MD, [place] Pgr: [facility]

## 2013-01-11 NOTE — Progress Notes (Signed)
Unable to attain PIV access and unable to discontinue central line. Titus Mould, MD aware

## 2013-01-11 NOTE — Progress Notes (Signed)
Pt. Admitted to rm. 3314 from 2100; follows some commands; vss; family at bedside.  Will continue to monitor.  Shelby Cellar RN

## 2013-01-11 NOTE — Evaluation (Signed)
Physical Therapy Evaluation Patient Details Name: [name] [name] MRN: [id number] DOB: 1932-11-09 Today's Date: 01/11/2013 Time: [id number] PT Time Calculation (min): 30 min  PT Assessment / Plan / Recommendation Clinical Impression    79 yo without significant medical history admitted to [facility] with acute pancreatitis. Imaging demonstrated dilated CBD and gallbladder stone. Course was complicated by oliguria and acute encephalopathy and suspected CVA presents with significant deficits in functional mobility secondary to weakness, inattention, neglect, decreased tone and decreased arousal. Will benefit from skilled PT to address deficits as indicated and improve overall function. Rec CIR upon discharge. Will continue to see as indicated.    PT Assessment  Patient needs continued PT services    Follow Up Recommendations  CIR    Does the patient have the potential to tolerate intense rehabilitation    TBD  Barriers to Discharge        Equipment Recommendations   (TBD)    Recommendations for Other Services Rehab consult   Frequency Min 3X/week    Precautions / Restrictions     Pertinent Vitals/Pain VSS      Mobility  Bed Mobility Bed Mobility: Supine to Sit;Sitting - Scoot to Edge of Bed Supine to Sit: 1: +2 Total assist;HOB flat Supine to Sit: Patient Percentage: 0% Sitting - Scoot to Edge of Bed: 1: +2 Total assist Sitting - Scoot to Edge of Bed: Patient Percentage: 0% Details for Bed Mobility Assistance: Total +2 assist, minimal response to movement (eyes open, minimal vocalizations) Transfers Transfers: Not assessed Ambulation/Gait Ambulation/Gait Assistance: Not tested (comment)        PT Diagnosis: Altered mental status  PT Problem List: Decreased strength;Decreased range of motion;Decreased activity tolerance;Decreased balance;Decreased mobility;Decreased coordination;Decreased cognition;Impaired tone PT Treatment Interventions: Gait training;Functional  mobility training;Therapeutic activities;Therapeutic exercise;Balance training;Cognitive remediation;Patient/family education   PT Goals Acute Rehab PT Goals PT Goal Formulation: Patient unable to participate in goal setting Time For Goal Achievement: 01/25/13 Potential to Achieve Goals: Fair Pt will go Supine/Side to Sit: with supervision PT Goal: Supine/Side to Sit - Progress: Goal set today Pt will Sit at Edge of Bed: with supervision PT Goal: Sit at [facility] Of Bed - Progress: Goal set today Pt will go Sit to Stand: with min assist PT Goal: Sit to Stand - Progress: Goal set today Pt will Transfer Bed to Chair/Chair to Bed: with min assist PT Transfer Goal: Bed to Chair/Chair to Bed - Progress: Goal set today  Visit Information  Last PT Received On: 01/11/13 Assistance Needed: +2    Subjective Data  Subjective: pt  with uninteligible moans on occasion Patient Stated Goal: none stated   Prior Functioning  Home Living Lives With: Spouse Available Help at Discharge: Family Type of Home: House Home Access: Stairs to enter Technical brewer of Steps: 4 Entrance Stairs-Rails: None Home Layout: One level Bathroom Shower/Tub: Tub/shower unit;Walk-in shower Bathroom Toilet: Handicapped height Home Adaptive Equipment: Wheelchair - manual Prior Function Level of Independence: Independent Able to Take Stairs?: Yes Driving: Yes Vocation: Retired    Solicitor Arousal/Alertness: Lethargic Overall Cognitive Status: Difficult to assess Difficult to assess due to: Level of arousal    Extremity/Trunk Assessment Right Upper Extremity Assessment RUE ROM/Strength/Tone: Deficits;Unable to fully assess RUE ROM/Strength/Tone Deficits: trace movements noted, otherwise flaccid appearance Left Upper Extremity Assessment LUE ROM/Strength/Tone: [facility] for tasks assessed Right Lower Extremity Assessment RLE ROM/Strength/Tone: Deficits;Unable to fully assess RLE ROM/Strength/Tone  Deficits: noted extension and flexion as response to activity Left Lower Extremity Assessment LLE ROM/Strength/Tone:  Unable to fully assess   Balance Balance Balance Assessed: Yes Static Sitting Balance Static Sitting - Balance Support: Left upper extremity supported;Bilateral upper extremity supported;Feet supported Static Sitting - Level of Assistance: 1: +2 Total assist Static Sitting - Comment/# of Minutes: 14 minutes EOB, Arousal techniques employed, significant posterior pushing, left gaze, minimal self support through LUE. Patient with no attention to R side.  End of Session PT - End of Session Activity Tolerance: Patient limited by fatigue;Other (comment) (minimally responsive) Patient left: in bed;with call bell/phone within reach;with nursing in room Nurse Communication: Mobility status  GP     Duncan Dull 01/11/2013, [place], [place] DPT  [phone]

## 2013-01-11 NOTE — Progress Notes (Signed)
Pt. Transported to MRI; vss.  [place] Cellar RN

## 2013-01-11 NOTE — Progress Notes (Signed)
I was called by radiology.   Unable to tolerate the MRI/MRA brain, not tolerating supine position; only diffusion images performed; confirmed multiple strokes;   Defere further management to neurology and rounding team.

## 2013-01-11 NOTE — Progress Notes (Signed)
MRI only able to complete part of the MRI; MRA on hold for now due to patient moving and coughing and images being undiagnostic due to movement.  [place] Cellar RN

## 2013-01-12 ENCOUNTER — Other Ambulatory Visit: Payer: Self-pay | Admitting: Internal Medicine

## 2013-01-12 DIAGNOSIS — E039 Hypothyroidism, unspecified: Secondary | ICD-10-CM

## 2013-01-12 LAB — CBC WITH DIFFERENTIAL/PLATELET
Basophils Absolute: 0 10*3/uL (ref 0.0–0.1)
Basophils Relative: 0 % (ref 0–1)
Eosinophils Absolute: 0 10*3/uL (ref 0.0–0.7)
HCT: 25.1 % — ABNORMAL LOW (ref 36.0–46.0)
Hemoglobin: 8.8 g/dL — ABNORMAL LOW (ref 12.0–15.0)
MCH: 31.1 pg (ref 26.0–34.0)
MCHC: 35.1 g/dL (ref 30.0–36.0)
Monocytes Absolute: 0.8 10*3/uL (ref 0.1–1.0)
Monocytes Relative: 9 % (ref 3–12)
Neutro Abs: 7.1 10*3/uL (ref 1.7–7.7)
Neutrophils Relative %: 80 % — ABNORMAL HIGH (ref 43–77)
RDW: 14.6 % (ref 11.5–15.5)

## 2013-01-12 LAB — GLUCOSE, CAPILLARY
Glucose-Capillary: 117 mg/dL — ABNORMAL HIGH (ref 70–99)
Glucose-Capillary: 142 mg/dL — ABNORMAL HIGH (ref 70–99)

## 2013-01-12 LAB — COMPREHENSIVE METABOLIC PANEL
ALT: 81 U/L — ABNORMAL HIGH (ref 0–35)
AST: 46 U/L — ABNORMAL HIGH (ref 0–37)
Alkaline Phosphatase: 68 U/L (ref 39–117)
CO2: 31 mEq/L (ref 19–32)
GFR calc Af Amer: 48 mL/min — ABNORMAL LOW (ref >90)
GFR calc non Af Amer: 42 mL/min — ABNORMAL LOW (ref >90)
Glucose, Bld: 152 mg/dL — ABNORMAL HIGH (ref 70–99)
Potassium: 3.1 mEq/L — ABNORMAL LOW (ref 3.5–5.1)
Sodium: 143 mEq/L (ref 135–145)

## 2013-01-12 MED ORDER — SODIUM CHLORIDE 0.9 % IJ SOLN
INTRAMUSCULAR | Status: AC
  Administered 2013-01-12: 10 mL
  Filled 2013-01-12: qty 20

## 2013-01-12 MED ORDER — GABAPENTIN 100 MG PO CAPS
200.0000 mg | ORAL_CAPSULE | Freq: Every day | ORAL | Status: DC
  Administered 2013-01-12 – 2013-01-13 (×2): 200 mg via ORAL
  Filled 2013-01-12 (×3): qty 2

## 2013-01-12 MED ORDER — HYDROCHLOROTHIAZIDE 12.5 MG PO CAPS
12.5000 mg | ORAL_CAPSULE | Freq: Every day | ORAL | Status: DC
  Administered 2013-01-12 – 2013-01-14 (×3): 12.5 mg via ORAL
  Filled 2013-01-12 (×4): qty 1

## 2013-01-12 MED ORDER — SODIUM CHLORIDE 0.9 % IJ SOLN
INTRAMUSCULAR | Status: AC
  Administered 2013-01-12: 30 mL
  Filled 2013-01-12: qty 30

## 2013-01-12 MED ORDER — POTASSIUM CHLORIDE 20 MEQ/15ML (10%) PO LIQD
ORAL | Status: AC
  Filled 2013-01-12: qty 30

## 2013-01-12 MED ORDER — HYDROCODONE-ACETAMINOPHEN 10-325 MG PO TABS
1.0000 | ORAL_TABLET | Freq: Four times a day (QID) | ORAL | Status: DC | PRN
  Administered 2013-01-12 – 2013-01-14 (×5): 1 via ORAL
  Filled 2013-01-12 (×5): qty 1

## 2013-01-12 MED ORDER — POTASSIUM CHLORIDE 20 MEQ/15ML (10%) PO LIQD
40.0000 meq | Freq: Every day | ORAL | Status: DC
  Administered 2013-01-12 – 2013-01-14 (×3): 40 meq via ORAL
  Filled 2013-01-12 (×3): qty 30

## 2013-01-12 MED ORDER — HALOPERIDOL LACTATE 5 MG/ML IJ SOLN: 2.0000 mg | Freq: Four times a day (QID) | INTRAMUSCULAR | Status: DC | PRN

## 2013-01-12 NOTE — Progress Notes (Addendum)
[facility] Progress Note [place] TEAM 1 - Stepdown/ICU TEAM   [name] [name] [id number] DOB: 05/07/1933 DOA: 01/07/2013 PCP: Walker Kehr, MD  Brief narrative: 5/4 Admitted to [facility] with acute pancreatitis  5/4 Abdominal US >>> Gallbladder stones, CBD 4 mm  5/4 Abdomen CT >>> Diffuse acute pancreatitis, CBD mildly dilated, no mass, dilated gallbladder with gallstones, no hydronephrosis  5/5 Transferred to [facility]  5/6 Head CT>>>Scattered gray and white matter edema. Posterior circulation predominant but also left MCA involvement suspected. No associated mass effect or hemorrhage. Top differential considerations include subacute embolic infarcts and posterior reversible encephalopathy syndrome.  5/7 patient intubated due to increasing WOB and patient in significant pain.  5/8 extubated  This is a 79 y/o who was admitted to [facility] with acute pancreatitis later diagnosed to be gallstone pancreatitis. She developed oliguric renal failure, malignant HTN at [facility].  She became confused on 5/5 and on 5/6 was noted to have slurred speech and right sided facial weakness. Code stroke was called by pt was not a candidate for TPA due to inability to pinpoint when she was last seen without these symptoms. MRI reveals embolic infarcts.    Assessment/Plan: Active Problems:   Acute pancreatitis - related to passed gallstone? -will need surgical eval eventually for cholecystectomy - lipase normalized and pain resolved -advanced tube feeds to 40 /hr for now which she is tolerating well -requested re-eval from nutritionist to establish goal rate    Acute renal failure -Resolved -baseline Cr about 1.5    CVA -limited MRI but embolic infarcts noted - carotid dopplers without significant stenosis - 2 D Echo without noted thrombus - currently on ASA -TEE recommended- I have contacted [place] cardiology-will be performed on Monday - Swallow eval done- NPO recommended- if  accepted at CIR, can cont NG and cont working with SLP  Hypertrophic cardiomyopathy (septal) - with SAM of Mitral valve -cont B Blocker and maintain adequate preload - will need ECHO with definity contrast- ordered -EF 80%   Code Status: full code Family Communication: with daughter Disposition Plan: follow in SDU until TEE complete  Consultants: neurology   DVT prophylaxis: Heparin  HPI/Subjective: Pt awake and more alert today, still minimally communicative- one word answers- smiling today- daughter quite encouraged by pt's improvement over the past couple of days.    Objective: Blood pressure 148/52, pulse 85, temperature 98 F (36.7 C), temperature source Oral, resp. rate 25, height 5\' 3"  (1.6 m), weight 74.9 kg (165 lb 2 oz), SpO2 92.00%.  Intake/Output Summary (Last 24 hours) at 01/12/13 1827 Last data filed at 01/12/13 1700  Gross per 24 hour  Intake    660 ml  Output   6025 ml  Net  -5365 ml     Exam: General: Elderly lady laying in bed, No acute respiratory distress Lungs: Clear to auscultation bilaterally without wheezes or crackles Cardiovascular: Regular rate and rhythm without murmur gallop or rub normal S1 and S2 Abdomen: Nontender, nondistended, soft, bowel sounds positive, no rebound, no ascites, no appreciable mass Extremities: No significant cyanosis, clubbing, or edema bilateral lower extremities Neuro: verbalizes with one word answers only, follows some commands, right arm 0/5, right leg moving spontaneously today and lifting off on bed. Moves left side spontaneously as well.   Data Reviewed: Basic Metabolic Panel:  Recent Labs Lab 01/07/13 0210 01/08/13 0535 01/08/13 1500 01/09/13 0405 01/10/13 0355 01/11/13 0500 01/12/13 0340  NA 138 139 140 144 144 145 143  K 4.5 4.5 3.9  3.6 3.7 3.7 3.1*  CL 107 109 109 111 110 109 101  CO2 18* 17* 19 23 23 23 31   GLUCOSE 92 94 112* 103* 108* 103* 152*  BUN 66* 69* 64* 59* 45* 34* 35*  CREATININE  3.17* 3.12* 2.55* 2.13* 1.52* 1.23* 1.21*  CALCIUM 7.3* 7.2* 7.0* 6.6* 7.2* 7.9* 7.7*  MG 1.7 1.7  --  1.7  --   --  1.7  PHOS 4.7*  --   --  3.0  --   --   --    Liver Function Tests:  Recent Labs Lab 01/08/13 0501 01/08/13 1500 01/09/13 0405 01/10/13 0355 01/12/13 0340  AST 339* 355* 185* 98* 46*  ALT 188* 259* 185* 136* 81*  ALKPHOS 62 68 67 68 68  BILITOT 0.6 0.6 0.6 0.5 0.5  PROT 5.8* 5.7* 5.1* 5.0* 5.7*  ALBUMIN 1.9* 1.9* 1.6* 1.4* 1.7*    Recent Labs Lab 01/07/13 0210 01/08/13 1500 01/11/13 0500  LIPASE 1434* 720* 52  AMYLASE  --  857*  --    No results found for this basename: AMMONIA,  in the last 168 hours CBC:  Recent Labs Lab 01/07/13 0210 01/08/13 0535 01/09/13 0405 01/10/13 0355 01/11/13 0500 01/12/13 0340  WBC 20.9* 20.8* 10.6* 11.3* 9.9 8.8  NEUTROABS 18.7*  --  8.9* 9.4* 7.8* 7.1  HGB 10.7* 10.3* 9.0* 8.1* 8.2* 8.8*  HCT 30.5* 29.6* 25.4* 23.2* 23.8* 25.1*  MCV 87.4 87.1 87.0 87.5 88.8 88.7  PLT 119* 103* 114* 133* 159 181   Cardiac Enzymes:  Recent Labs Lab 01/07/13 2240 01/08/13 0535  TROPONINI <0.30 <0.30   BNP (last 3 results) No results found for this basename: PROBNP,  in the last 8760 hours CBG:  Recent Labs Lab 01/11/13 2344 01/12/13 0335 01/12/13 0812 01/12/13 1200 01/12/13 1608  GLUCAP 117* 132* 142* 135* 145*    Recent Results (from the past 240 hour(s))  MRSA PCR SCREENING     Status: None   Collection Time    01/07/13  9:25 PM      Result Value Range Status   MRSA by PCR NEGATIVE  NEGATIVE Final   Comment:            The GeneXpert MRSA Assay (FDA     approved for NASAL specimens     only), is one component of a     comprehensive MRSA colonization     surveillance program. It is not     intended to diagnose MRSA     infection nor to guide or     monitor treatment for     MRSA infections.  CULTURE, BLOOD (ROUTINE X 2)     Status: None   Collection Time    01/07/13 10:55 PM      Result Value Range  Status   Specimen Description BLOOD RIGHT HAND   Final   Special Requests BOTTLES DRAWN AEROBIC ONLY 5CC   Final   Culture  Setup Time 01/08/2013 03:50   Final   Culture     Final   Value:        BLOOD CULTURE RECEIVED NO GROWTH TO DATE CULTURE WILL BE HELD FOR 5 DAYS BEFORE ISSUING A FINAL NEGATIVE REPORT   Report Status PENDING   Incomplete  CULTURE, BLOOD (ROUTINE X 2)     Status: None   Collection Time    01/07/13 11:50 PM      Result Value Range Status   Specimen Description BLOOD LEFT ARM  Final   Special Requests BOTTLES DRAWN AEROBIC ONLY 5CC   Final   Culture  Setup Time 01/08/2013 03:49   Final   Culture     Final   Value:        BLOOD CULTURE RECEIVED NO GROWTH TO DATE CULTURE WILL BE HELD FOR 5 DAYS BEFORE ISSUING A FINAL NEGATIVE REPORT   Report Status PENDING   Incomplete     Studies:  Recent x-ray studies have been reviewed in detail by the Attending Physician  Scheduled Meds:  Scheduled Meds: . antiseptic oral rinse  15 mL Mouth Rinse QID  . aspirin  325 mg Oral Daily  . chlorhexidine  15 mL Mouth Rinse BID  . feeding supplement (VITAL AF 1.2 CAL)  1,000 mL Per Tube Q24H  . furosemide  40 mg Intravenous Q12H  . gabapentin  200 mg Oral QHS  . heparin subcutaneous  5,000 Units Subcutaneous Q8H  . hydrochlorothiazide  12.5 mg Oral Daily  . levothyroxine  44 mcg Intravenous Daily  . pantoprazole (PROTONIX) IV  40 mg Intravenous QHS  . potassium chloride  40 mEq Oral Daily   Continuous Infusions: . sodium chloride 20 mL/hr at 01/12/13 1700    Time spent on care of this patient: 25 min   Debbe Odea, [place]  [facility] Office  [phone] Pager - Text Page per Amion as per below:  On-Call/Text Page:      [email or web address]      password [email or web address]  If 7PM-7AM, please contact night-coverage [email or web address] Password [facility] 01/12/2013, 6:27 PM   LOS: 5 days

## 2013-01-12 NOTE — Progress Notes (Signed)
Stroke Team Progress Note  HISTORY [name] [name] is a 79 y.o. female who was admitted to [facility] 01/06/2013 for pancreatitis. Imaging demonstrated dilated CBD and gallbladder stone. Course was complicated by oliguria and acute encephalopathy.  She became confused yesterday 01/07/2013, but it was attributed to her infection and pain medications. She was transferred to [facility] (arrived 2130). Today 01/08/2013 at 0300, it was noted that she had an increase in her slurred speech and at that time some right sided facial weakness was also noted and therefore a code stroke was called. Patient was not a TPA candidate secondary to time last known well unknown - it was at least prior to transfer to [facility]. She was admitted for further evaluation and treatment.  SUBJECTIVE The patient's daughters at the bedside today. The patient is lying in bed. Dr. Leonie Man had a long talk with the patient's daughter regarding the patient's condition, overall prognosis, and treatment plan. He felt the patient could possibly be a candidate for inpatient rehabilitation.   OBJECTIVE Most recent Vital Signs: Filed Vitals:   01/12/13 0500 01/12/13 0800 01/12/13 0810 01/12/13 0835  BP:  164/55 161/50 139/46  Pulse:  91 84 72  Temp:  98.1 F (36.7 C)    TempSrc:  Oral    Resp:  18 26 31   Height:      Weight: 74.9 kg (165 lb 2 oz)     SpO2:  91% 92% 94%   CBG (last 3)   Recent Labs  01/11/13 2137 01/11/13 2344 01/12/13 0335  GLUCAP 111* 117* 132*    IV Fluid Intake:   . sodium chloride 20 mL/hr at 01/12/13 0800    MEDICATIONS  . antiseptic oral rinse  15 mL Mouth Rinse QID  . aspirin  325 mg Oral Daily  . chlorhexidine  15 mL Mouth Rinse BID  . feeding supplement (VITAL AF 1.2 CAL)  1,000 mL Per Tube Q24H  . furosemide  40 mg Intravenous Q12H  . heparin subcutaneous  5,000 Units Subcutaneous Q8H  . levothyroxine  44 mcg Intravenous Daily  . pantoprazole (PROTONIX) IV  40 mg Intravenous QHS  . potassium  chloride  40 mEq Oral Daily  . potassium chloride      . sodium chloride       PRN:  albuterol, fentaNYL, labetalol  Diet:  NPO  Activity:  Bedrest DVT Prophylaxis:  SCDs, heparin SQ   CLINICALLY SIGNIFICANT STUDIES Basic Metabolic Panel:   Recent Labs Lab 01/07/13 0210  01/09/13 0405  01/11/13 0500 01/12/13 0340  NA 138  < > 144  < > 145 143  K 4.5  < > 3.6  < > 3.7 3.1*  CL 107  < > 111  < > 109 101  CO2 18*  < > 23  < > 23 31  GLUCOSE 92  < > 103*  < > 103* 152*  BUN 66*  < > 59*  < > 34* 35*  CREATININE 3.17*  < > 2.13*  < > 1.23* 1.21*  CALCIUM 7.3*  < > 6.6*  < > 7.9* 7.7*  MG 1.7  < > 1.7  --   --  1.7  PHOS 4.7*  --  3.0  --   --   --   < > = values in this interval not displayed. Liver Function Tests:   Recent Labs Lab 01/10/13 0355 01/12/13 0340  AST 98* 46*  ALT 136* 81*  ALKPHOS 68 68  BILITOT 0.5 0.5  PROT 5.0* 5.7*  ALBUMIN 1.4* 1.7*   CBC:   Recent Labs Lab 01/11/13 0500 01/12/13 0340  WBC 9.9 8.8  NEUTROABS 7.8* 7.1  HGB 8.2* 8.8*  HCT 23.8* 25.1*  MCV 88.8 88.7  PLT 159 181   Coagulation:   Recent Labs Lab 01/07/13 0210  LABPROT 17.7*  INR 1.50*   Cardiac Enzymes:   Recent Labs Lab 01/07/13 2240 01/08/13 0535  TROPONINI <0.30 <0.30   Urinalysis:   Recent Labs Lab 01/07/13 2343 01/11/13 0058  COLORURINE YELLOW YELLOW  LABSPEC 1.028 1.016  PHURINE 5.0 5.5  GLUCOSEU NEGATIVE NEGATIVE  HGBUR TRACE* MODERATE*  BILIRUBINUR SMALL* NEGATIVE  KETONESUR 15* 40*  PROTEINUR 30* 30*  UROBILINOGEN 1.0 0.2  NITRITE NEGATIVE NEGATIVE  LEUKOCYTESUR SMALL* NEGATIVE   Lipid Panel    Component Value Date/Time   CHOL 110 01/08/2013 0535   TRIG 102 01/08/2013 0535   HDL 13* 01/08/2013 0535   CHOLHDL 8.5 01/08/2013 0535   VLDL 20 01/08/2013 0535   LDLCALC 77 01/08/2013 0535   HgbA1C  Lab Results  Component Value Date   HGBA1C 5.7* 01/08/2013    Urine Drug Screen:   No results found for this basename: labopia,  cocainscrnur,   labbenz,  amphetmu,  thcu,  labbarb    Alcohol Level: No results found for this basename: ETH,  in the last 168 hours  CT of the brain  01/08/2013  Scattered gray and white matter edema.  Posterior circulation predominant but also left MCA involvement suspected.  No associated mass effect or hemorrhage. Top differential considerations include subacute embolic infarcts and posterior reversible encephalopathy syndrome.    MRI of the brain  01/11/13  Bilateral supratentorial and cerebellar acute / subacute infarcts are confirmed.  2. Only diffusion weighted imaging was obtained as the patient was unable tolerate further exam due to difficulty lying flat with cough and risk for aspiration. Marland Kitchen MRA of the brain    2D Echocardiogram  EF 80% There is severe septal thickening consistent with hypertrophic cardiomyopathy.  Carotid Doppler  Bilateral: No evidence of hemodynamically significant internal carotid artery stenosis. Vertebral artery flow is antegrade.   CXR  01/09/2013   1. The endotracheal tube appears to be in good position. 2. No change in pleural effusions and interstitial edema. 01/11/2013. Slightly increased pulmonary vascular congestion now bordering on mild interstitial edema  2. Interval extubation and removal of nasogastric tube  3. Otherwise similar appearance of small bilateral effusions, bibasilar atelectasis and right hemidiaphragm elevation   EKG  sinus tachycardia.   Therapy Recommendations possible inpatient rehabilitation - screening ordered.  Physical Exam   Pleasant middle aged caucasian lady not in distress.Awake alert. Afebrile. Head is nontraumatic. Neck is supple without bruit. Hearing is normal. Cardiac exam no murmur or gallop. Lungs are clear to auscultation. Distal pulses are well felt. Neurological Exam ;  Awake alert globally aphasic Diminished attention, easy distractibility.   Follows only occasional midline commands.  Extraocular movements are full range without  nystagmus. Blinks to threat bilaterally. Face is symmetric without weakness. Tongue is midline. Motor system exam reveals dense right hemiplegia with 0/5 right upper extremity and 1-2/5 right lower extremity strength. Purposeful antigravity movements on the left. Plantars are downgoing. Sensation is intact.  Gait was not tested.  ASSESSMENT [name] [name] is a 78 y.o. female presenting with acute confusion in the setting of pancreatitis. Imaging confirms bilateral hypodensities in the anterior and posterior circulation - embolic infarcts likely. PRES  less  likelygiven malignant hypertension prior to transfer here.  On aspirin 81 mg orally every day prior to admission. Now on aspirin suppository for secondary stroke prevention.   Malignant hypertension at [facility], leading to dx of possible PRES Hyperlipidemia, LDL 77, not on statin PTA, at goal LDL < 100  Hospital day # 5  TREATMENT/PLAN  Continue aspirin suppository for secondary stroke prevention.  Ongoing aggressive BP management  Will probably need gastrostomy feeding tube placement.  Will need TEE to evaluate for source of embolic infarcts. If Coumadin is indicated will need to postpone until PEG is placed.  Dr. Leonie Man discussed the above plan with the patient's daughter today.  Mikey Bussing PA-C [facility] Pager [phone] 01/12/2013, 10:29 AM  I have personally obtained a history, examined the patient, evaluated imaging results, and formulated the assessment and plan of care. I agree with the above.  Antony Contras, MD

## 2013-01-13 DIAGNOSIS — R030 Elevated blood-pressure reading, without diagnosis of hypertension: Secondary | ICD-10-CM

## 2013-01-13 LAB — GLUCOSE, CAPILLARY
Glucose-Capillary: 173 mg/dL — ABNORMAL HIGH (ref 70–99)
Glucose-Capillary: 180 mg/dL — ABNORMAL HIGH (ref 70–99)
Glucose-Capillary: 182 mg/dL — ABNORMAL HIGH (ref 70–99)
Glucose-Capillary: 185 mg/dL — ABNORMAL HIGH (ref 70–99)

## 2013-01-13 LAB — BASIC METABOLIC PANEL
BUN: 41 mg/dL — ABNORMAL HIGH (ref 6–23)
Calcium: 7.3 mg/dL — ABNORMAL LOW (ref 8.4–10.5)
Creatinine, Ser: 1.26 mg/dL — ABNORMAL HIGH (ref 0.50–1.10)
GFR calc Af Amer: 46 mL/min — ABNORMAL LOW (ref >90)
GFR calc non Af Amer: 40 mL/min — ABNORMAL LOW (ref >90)
Glucose, Bld: 196 mg/dL — ABNORMAL HIGH (ref 70–99)

## 2013-01-13 MED ORDER — SODIUM CHLORIDE 0.9 % IJ SOLN
INTRAMUSCULAR | Status: AC
  Administered 2013-01-13: 10 mL
  Filled 2013-01-13: qty 10

## 2013-01-13 MED ORDER — SODIUM CHLORIDE 0.9 % IJ SOLN
INTRAMUSCULAR | Status: AC
  Administered 2013-01-13: 10 mL
  Filled 2013-01-13: qty 20

## 2013-01-13 NOTE — Evaluation (Signed)
Clinical/Bedside Swallow Evaluation Patient Details  Name: [name] [name] MRN: [id number] Date of Birth: 11-Dec-1932  Today's Date: 01/13/2013 Time: 1600-1630 SLP Time Calculation (min): 30 min  Past Medical History:  Past Medical History  Diagnosis Date  . Hypertension   . Hyperlipidemia   . Palpitations   . Thyroid disease   . Weakness generalized   . Anemia   . Other urinary problems   . Lightheadedness   . Dyspnea     w/ atypical upper airway symptoms? all vcd/lpr/gerd  . Breast pain   . Vasovagal syncope   . Cellulitis and abscess of unspecified digit   . Urinary tract infection, site not specified   . Urgency of urination   . Other symptoms involving abdomen and pelvis   . Rash and other nonspecific skin eruption   . Unspecified venous (peripheral) insufficiency   . Edema   . Osteoarthrosis, unspecified whether generalized or localized, unspecified site   . Monoclonal paraproteinemia   . Irritable bowel syndrome   . Other B-complex deficiencies   . Unspecified hypothyroidism   . GERD (gastroesophageal reflux disease)   . Anxiety   . Torus palatinus    Past Surgical History:  Past Surgical History  Procedure Laterality Date  . Back surgery    . Shoulder surgery    . Cholecystectomy    . Abdominal hysterectomy    . Vaginosacropexy    . Suprapubic bladder suspension     HPI:   5 /4 Admitted to [place] with acute pancreatitis  5/4 Abdominal US >>> Gallbladder stones, CBD 4 mm  5/4 Abdomen CT >>> Diffuse acute pancreatitis, CBD mildly dilated, no mass, dilated gallbladder with gallstones, no hydronephrosis  5/5 Transferred to [facility]  5/6 Head CT>>>Scattered gray and white matter edema. Posterior circulation predominant but also left MCA involvement suspected. No associated mass effect or hemorrhage. Top differential considerations include subacute embolic infarcts and posterior reversible encephalopathy syndrome.  5/7 patient intubated due to increasing WOB and  patient in significant pain.  5/8 extubated  This is a 79 y/o who was admitted to [facility] with acute pancreatitis later diagnosed to be gallstone pancreatitis. She developed oliguric renal failure, malignant HTN at [facility].  Referred for BSE per Stroke Protocol.  She became confused on 5/5 and on 5/6 was noted to have slurred speech and right sided facial weakness. Code stroke was called by pt was not a candidate for TPA due to inability to pinpoint when she was last seen without these symptoms. MRI reveals embolic infarcts.      Assessment / Plan / Recommendation Clinical Impression  Oropharyngeal dysphagia indicated marked by delay in initiation with decreased hyoid laryngeal elevation as noted by palpation.  No immediate s/s of aspiraiton noted until moderately after swallow of trial ice chips and thin water by spoon.  Cough wet and congested.  Initially, patient unable to complete volitional throat clear or cough but able to complete at end of evaluation.  Recommend to continue NPO status with temporary means of nutrition and hydration as patient presents with reduced ability to protect airway with PO's at this time.  ST to reasses swallow bedside when warranted as per Nursing patient to have TEE completed on 01/14/13. Completion of objective evaluation to be determined. ST to follow in acute care setting for dysphagia management.     Aspiration Risk  Moderate    Diet Recommendation Alternative means - temporary;NPO           Follow Up  Recommendations    CIR   Frequency and Duration min 2x/week  2 weeks       SLP Swallow Goals Goal #3: Consume diagnostic PO trials of various consistencies administered by SLP only to determine PO/MBS readiness.     Swallow Study Prior Functional Status   Lived at home with spouse    General Date of Onset: 01/07/13 Type of Study: Bedside swallow evaluation Diet Prior to this Study: NPO;Panda Temperature Spikes Noted: No Respiratory  Status: Supplemental O2 delivered via (comment) (nasal cannula ) History of Recent Intubation: Yes Length of Intubations (days): 1 days Date extubated: 01/10/13 Behavior/Cognition: Alert;Cooperative;Pleasant mood;Requires cueing Oral Cavity - Dentition: Adequate natural dentition Self-Feeding Abilities: Total assist Patient Positioning: Upright in bed Baseline Vocal Quality: Clear Volitional Cough: Cognitively unable to elicit Volitional Swallow: Unable to elicit    Oral/Motor/Sensory Function Overall Oral Motor/Sensory Function: Impaired Labial ROM: Within Functional Limits Labial Symmetry: Within Functional Limits Labial Strength: Within Functional Limits Labial Sensation: Reduced Lingual ROM: Reduced right Lingual Symmetry: Abnormal symmetry right Lingual Strength: Reduced Lingual Sensation: Reduced Facial ROM: Within Functional Limits Facial Symmetry: Within Functional Limits Facial Strength: Reduced Facial Sensation: Reduced Velum: Within Functional Limits Mandible: Within Functional Limits   Ice Chips Ice chips: Impaired Presentation: Self Fed Oral Phase Impairments: Impaired anterior to posterior transit Pharyngeal Phase Impairments: Suspected delayed Swallow;Decreased hyoid-laryngeal movement;Cough - Delayed   Thin Liquid Thin Liquid: Impaired Presentation: Spoon Pharyngeal  Phase Impairments: Suspected delayed Swallow;Decreased hyoid-laryngeal movement;Cough - Delayed    Nectar Thick Nectar Thick Liquid: Not tested   Honey Thick Honey Thick Liquid: Not tested   Puree Puree: Not tested   Solid   GO    Solid: Not tested      Sharman Crate [place], CCC-SLP [phone] [facility] 01/13/2013,4:50 PM

## 2013-01-13 NOTE — Progress Notes (Signed)
Stroke Team Progress Note  HISTORY [name] [name] is a 79 y.o. female who was admitted to [facility] 01/06/2013 for pancreatitis. Imaging demonstrated dilated CBD and gallbladder stone. Course was complicated by oliguria and acute encephalopathy.  She became confused yesterday 01/07/2013, but it was attributed to her infection and pain medications. She was transferred to [facility] (arrived 2130). Today 01/08/2013 at 0300, it was noted that she had an increase in her slurred speech and at that time some right sided facial weakness was also noted and therefore a code stroke was called. Patient was not a TPA candidate secondary to time last known well unknown - it was at least prior to transfer to [facility]. She was admitted for further evaluation and treatment.  SUBJECTIVE Showing slight improvement in speech and comprehension today.   OBJECTIVE Most recent Vital Signs: Filed Vitals:   01/12/13 2012 01/13/13 0047 01/13/13 0400 01/13/13 0800  BP: 146/44 144/47 142/46 111/39  Pulse: 87 89 103 80  Temp:  97.8 F (36.6 C) 97.9 F (36.6 C) 97.4 F (36.3 C)  TempSrc:  Oral Oral Oral  Resp: 22 22 26 22   Height:      Weight:   74.5 kg (164 lb 3.9 oz)   SpO2: 93% 94% 91% 93%   CBG (last 3)   Recent Labs  01/13/13 0044 01/13/13 0448 01/13/13 0728  GLUCAP 156* 169* 180*    IV Fluid Intake:   . sodium chloride 20 mL/hr at 01/13/13 0800    MEDICATIONS  . antiseptic oral rinse  15 mL Mouth Rinse QID  . aspirin  325 mg Oral Daily  . chlorhexidine  15 mL Mouth Rinse BID  . feeding supplement (VITAL AF 1.2 CAL)  1,000 mL Per Tube Q24H  . gabapentin  200 mg Oral QHS  . heparin subcutaneous  5,000 Units Subcutaneous Q8H  . hydrochlorothiazide  12.5 mg Oral Daily  . levothyroxine  44 mcg Intravenous Daily  . pantoprazole (PROTONIX) IV  40 mg Intravenous QHS  . potassium chloride  40 mEq Oral Daily   PRN:  albuterol, fentaNYL, haloperidol lactate, HYDROcodone-acetaminophen, labetalol  Diet:  NPO   Activity:  Bedrest DVT Prophylaxis:  SCDs, heparin SQ   CLINICALLY SIGNIFICANT STUDIES Basic Metabolic Panel:   Recent Labs Lab 01/07/13 0210  01/09/13 0405  01/12/13 0340 01/13/13 0600  NA 138  < > 144  < > 143 141  K 4.5  < > 3.6  < > 3.1* 2.9*  CL 107  < > 111  < > 101 96  CO2 18*  < > 23  < > 31 37*  GLUCOSE 92  < > 103*  < > 152* 196*  BUN 66*  < > 59*  < > 35* 41*  CREATININE 3.17*  < > 2.13*  < > 1.21* 1.26*  CALCIUM 7.3*  < > 6.6*  < > 7.7* 7.3*  MG 1.7  < > 1.7  --  1.7  --   PHOS 4.7*  --  3.0  --   --   --   < > = values in this interval not displayed. Liver Function Tests:   Recent Labs Lab 01/10/13 0355 01/12/13 0340  AST 98* 46*  ALT 136* 81*  ALKPHOS 68 68  BILITOT 0.5 0.5  PROT 5.0* 5.7*  ALBUMIN 1.4* 1.7*   CBC:   Recent Labs Lab 01/11/13 0500 01/12/13 0340  WBC 9.9 8.8  NEUTROABS 7.8* 7.1  HGB 8.2* 8.8*  HCT 23.8*  25.1*  MCV 88.8 88.7  PLT 159 181   Coagulation:   Recent Labs Lab 01/07/13 0210  LABPROT 17.7*  INR 1.50*   Cardiac Enzymes:   Recent Labs Lab 01/07/13 2240 01/08/13 0535  TROPONINI <0.30 <0.30   Urinalysis:   Recent Labs Lab 01/07/13 2343 01/11/13 0058  COLORURINE YELLOW YELLOW  LABSPEC 1.028 1.016  PHURINE 5.0 5.5  GLUCOSEU NEGATIVE NEGATIVE  HGBUR TRACE* MODERATE*  BILIRUBINUR SMALL* NEGATIVE  KETONESUR 15* 40*  PROTEINUR 30* 30*  UROBILINOGEN 1.0 0.2  NITRITE NEGATIVE NEGATIVE  LEUKOCYTESUR SMALL* NEGATIVE   Lipid Panel    Component Value Date/Time   CHOL 110 01/08/2013 0535   TRIG 102 01/08/2013 0535   HDL 13* 01/08/2013 0535   CHOLHDL 8.5 01/08/2013 0535   VLDL 20 01/08/2013 0535   LDLCALC 77 01/08/2013 0535   HgbA1C  Lab Results  Component Value Date   HGBA1C 5.7* 01/08/2013    Urine Drug Screen:   No results found for this basename: labopia,  cocainscrnur,  labbenz,  amphetmu,  thcu,  labbarb    Alcohol Level: No results found for this basename: ETH,  in the last 168 hours  CT of the brain   01/08/2013  Scattered gray and white matter edema.  Posterior circulation predominant but also left MCA involvement suspected.  No associated mass effect or hemorrhage. Top differential considerations include subacute embolic infarcts and posterior reversible encephalopathy syndrome.    MRI of the brain  01/11/13  Bilateral supratentorial and cerebellar acute / subacute infarcts are confirmed.  2. Only diffusion weighted imaging was obtained as the patient was unable tolerate further exam due to difficulty lying flat with cough and risk for aspiration. Marland Kitchen MRA of the brain    2D Echocardiogram  EF 80% There is severe septal thickening consistent with hypertrophic cardiomyopathy.  Carotid Doppler  Bilateral: No evidence of hemodynamically significant internal carotid artery stenosis. Vertebral artery flow is antegrade.   CXR  01/09/2013   1. The endotracheal tube appears to be in good position. 2. No change in pleural effusions and interstitial edema. 01/11/2013. Slightly increased pulmonary vascular congestion now bordering on mild interstitial edema  2. Interval extubation and removal of nasogastric tube  3. Otherwise similar appearance of small bilateral effusions, bibasilar atelectasis and right hemidiaphragm elevation   EKG  sinus tachycardia.   Therapy Recommendations possible inpatient rehabilitation - screening ordered.  Physical Exam   Pleasant middle aged caucasian lady not in distress.Awake alert. Afebrile. Head is nontraumatic. Neck is supple without bruit. Hearing is normal. Cardiac exam no murmur or gallop. Lungs are clear to auscultation. Distal pulses are well felt. Neurological Exam ;  Awake alert globally aphasic.Speaks a few words only. Diminished attention, easy distractibility.   Follows now midline commands.  Extraocular movements are full range without nystagmus. Blinks to threat bilaterally. Face is symmetric without weakness. Tongue is midline. Motor system exam reveals dense  right hemiplegia with 0/5 right upper extremity and 1-2/5 right lower extremity strength. Purposeful antigravity movements on the left. Plantars are downgoing. Sensation is intact.  Gait was not tested.  ASSESSMENT [name] [name] is a 79 y.o. female presenting with acute confusion in the setting of pancreatitis. Imaging confirms bilateral hypodensities in the anterior and posterior circulation - embolic infarcts likely. PRES  less likelygiven malignant hypertension prior to transfer here.  On aspirin 81 mg orally every day prior to admission. Now on aspirin suppository for secondary stroke prevention.   Malignant hypertension at  [place], leading to dx of possible PRES Hyperlipidemia, LDL 77, not on statin PTA, at goal LDL < 100  Hospital day # 6  TREATMENT/PLAN  Continue aspirin suppository for secondary stroke prevention.  Ongoing aggressive BP management  Will probably need gastrostomy feeding tube placement.  Will need TEE to evaluate for source of embolic infarcts. If Coumadin is indicated will need to postpone until PEG is placed.  Mobilize out of bed. PT,OT,ST  Rehab consult  D/w daughter    Lowry Ram [facility] Pager [phone] 01/13/2013, 10:34 AM  I have personally obtained a history, examined the patient, evaluated imaging results, and formulated the assessment and plan of care. I agree with the above.  Antony Contras, MD

## 2013-01-14 DIAGNOSIS — I6340 Cerebral infarction due to embolism of unspecified cerebral artery: Secondary | ICD-10-CM

## 2013-01-14 LAB — CULTURE, BLOOD (ROUTINE X 2): Culture: NO GROWTH

## 2013-01-14 LAB — GLUCOSE, CAPILLARY
Glucose-Capillary: 123 mg/dL — ABNORMAL HIGH (ref 70–99)
Glucose-Capillary: 117 mg/dL — ABNORMAL HIGH (ref 70–99)

## 2013-01-14 MED ORDER — ASPIRIN 325 MG PO TABS
325.0000 mg | ORAL_TABLET | Freq: Every day | ORAL | Status: DC
  Administered 2013-01-15 – 2013-01-18 (×4): 325 mg
  Filled 2013-01-14 (×4): qty 1

## 2013-01-14 MED ORDER — SODIUM CHLORIDE 0.9 % IV SOLN
INTRAVENOUS | Status: DC
  Administered 2013-01-14 – 2013-01-15 (×2): via INTRAVENOUS
  Administered 2013-01-16: 250 mL via INTRAVENOUS
  Administered 2013-01-16: 17:00:00 via INTRAVENOUS
  Administered 2013-01-17 – 2013-01-20 (×4): 50 mL/h via INTRAVENOUS

## 2013-01-14 MED ORDER — LACTULOSE 10 GM/15ML PO SOLN
10.0000 g | Freq: Two times a day (BID) | ORAL | Status: DC
  Administered 2013-01-14 – 2013-01-18 (×8): 10 g
  Filled 2013-01-14 (×9): qty 15

## 2013-01-14 MED ORDER — POTASSIUM CHLORIDE 10 MEQ/100ML IV SOLN
10.0000 meq | INTRAVENOUS | Status: AC
  Administered 2013-01-14 (×3): 10 meq via INTRAVENOUS

## 2013-01-14 MED ORDER — POTASSIUM CHLORIDE 10 MEQ/50ML IV SOLN
10.0000 meq | Freq: Once | INTRAVENOUS | Status: AC
  Administered 2013-01-14: 10 meq via INTRAVENOUS

## 2013-01-14 MED ORDER — LEVOTHYROXINE SODIUM 88 MCG PO TABS
88.0000 ug | ORAL_TABLET | Freq: Every day | ORAL | Status: DC
Start: 2013-01-15 — End: 2013-01-18
  Administered 2013-01-15 – 2013-01-18 (×4): 88 ug
  Filled 2013-01-14 (×5): qty 1

## 2013-01-14 MED ORDER — HYDROCHLOROTHIAZIDE 12.5 MG PO CAPS: 12.5000 mg | ORAL_CAPSULE | Freq: Every day | ORAL | Status: DC

## 2013-01-14 MED ORDER — SODIUM CHLORIDE 0.9 % IV SOLN: INTRAVENOUS | Status: DC

## 2013-01-14 MED ORDER — PANTOPRAZOLE SODIUM 40 MG PO PACK
40.0000 mg | PACK | Freq: Every day | ORAL | Status: DC
  Administered 2013-01-14 – 2013-01-15 (×2): 40 mg
  Filled 2013-01-14 (×3): qty 20

## 2013-01-14 NOTE — Progress Notes (Addendum)
Stroke Team Progress Note  HISTORY [name] [name] is a 79 y.o. female who was admitted to [facility] 01/06/2013 for pancreatitis. Imaging demonstrated dilated CBD and gallbladder stone. Course was complicated by oliguria and acute encephalopathy.  She became confused yesterday 01/07/2013, but it was attributed to her infection and pain medications. She was transferred to [facility] (arrived 2130). Today 01/08/2013 at 0300, it was noted that she had an increase in her slurred speech and at that time some right sided facial weakness was also noted and therefore a code stroke was called. Patient was not a TPA candidate secondary to time last known well unknown - it was at least prior to transfer to [facility]. She was admitted for further evaluation and treatment.  SUBJECTIVE Showing slight improvement in speech and comprehension today. TCD done results [ending. TEE later today.   OBJECTIVE Most recent Vital Signs: Filed Vitals:   01/14/13 0400 01/14/13 0500 01/14/13 0728 01/14/13 0733  BP: 133/39     Pulse: 101     Temp: 97.8 F (36.6 C)   98 F (36.7 C)  TempSrc: Oral   Oral  Resp: 24     Height:      Weight:  74.9 kg (165 lb 2 oz)    SpO2: 93%  93%    CBG (last 3)   Recent Labs  01/13/13 2359 01/14/13 0529 01/14/13 0721  GLUCAP 185* 122* 128*    IV Fluid Intake:   . sodium chloride 20 mL/hr at 01/13/13 1800  . sodium chloride      MEDICATIONS  . antiseptic oral rinse  15 mL Mouth Rinse QID  . aspirin  325 mg Oral Daily  . chlorhexidine  15 mL Mouth Rinse BID  . feeding supplement (VITAL AF 1.2 CAL)  1,000 mL Per Tube Q24H  . gabapentin  200 mg Oral QHS  . heparin subcutaneous  5,000 Units Subcutaneous Q8H  . hydrochlorothiazide  12.5 mg Oral Daily  . levothyroxine  44 mcg Intravenous Daily  . pantoprazole (PROTONIX) IV  40 mg Intravenous QHS  . potassium chloride  40 mEq Oral Daily   PRN:  albuterol, fentaNYL, haloperidol lactate, HYDROcodone-acetaminophen, labetalol  Diet:   NPO  Activity:  Bedrest DVT Prophylaxis:  SCDs, heparin SQ   CLINICALLY SIGNIFICANT STUDIES Basic Metabolic Panel:   Recent Labs Lab 01/09/13 0405  01/12/13 0340 01/13/13 0600  NA 144  < > 143 141  K 3.6  < > 3.1* 2.9*  CL 111  < > 101 96  CO2 23  < > 31 37*  GLUCOSE 103*  < > 152* 196*  BUN 59*  < > 35* 41*  CREATININE 2.13*  < > 1.21* 1.26*  CALCIUM 6.6*  < > 7.7* 7.3*  MG 1.7  --  1.7  --   PHOS 3.0  --   --   --   < > = values in this interval not displayed. Liver Function Tests:   Recent Labs Lab 01/10/13 0355 01/12/13 0340  AST 98* 46*  ALT 136* 81*  ALKPHOS 68 68  BILITOT 0.5 0.5  PROT 5.0* 5.7*  ALBUMIN 1.4* 1.7*   CBC:   Recent Labs Lab 01/11/13 0500 01/12/13 0340  WBC 9.9 8.8  NEUTROABS 7.8* 7.1  HGB 8.2* 8.8*  HCT 23.8* 25.1*  MCV 88.8 88.7  PLT 159 181   Coagulation:  No results found for this basename: LABPROT, INR,  in the last 168 hours Cardiac Enzymes:   Recent Labs  Lab 01/07/13 2240 01/08/13 0535  TROPONINI <0.30 <0.30   Urinalysis:   Recent Labs Lab 01/07/13 2343 01/11/13 0058  COLORURINE YELLOW YELLOW  LABSPEC 1.028 1.016  PHURINE 5.0 5.5  GLUCOSEU NEGATIVE NEGATIVE  HGBUR TRACE* MODERATE*  BILIRUBINUR SMALL* NEGATIVE  KETONESUR 15* 40*  PROTEINUR 30* 30*  UROBILINOGEN 1.0 0.2  NITRITE NEGATIVE NEGATIVE  LEUKOCYTESUR SMALL* NEGATIVE   Lipid Panel    Component Value Date/Time   CHOL 110 01/08/2013 0535   TRIG 102 01/08/2013 0535   HDL 13* 01/08/2013 0535   CHOLHDL 8.5 01/08/2013 0535   VLDL 20 01/08/2013 0535   LDLCALC 77 01/08/2013 0535   HgbA1C  Lab Results  Component Value Date   HGBA1C 5.7* 01/08/2013    Urine Drug Screen:   No results found for this basename: labopia,  cocainscrnur,  labbenz,  amphetmu,  thcu,  labbarb    Alcohol Level: No results found for this basename: ETH,  in the last 168 hours  CT of the brain  01/08/2013  Scattered gray and white matter edema.  Posterior circulation predominant but also  left MCA involvement suspected.  No associated mass effect or hemorrhage. Top differential considerations include subacute embolic infarcts and posterior reversible encephalopathy syndrome.    MRI of the brain  01/11/13  Bilateral supratentorial and cerebellar acute / subacute infarcts are confirmed.  2. Only diffusion weighted imaging was obtained as the patient was unable tolerate further exam due to difficulty lying flat with cough and risk for aspiration. Marland Kitchen MRA of the brain    2D Echocardiogram  EF 80% There is severe septal thickening consistent with hypertrophic cardiomyopathy.  Carotid Doppler  Bilateral: No evidence of hemodynamically significant internal carotid artery stenosis. Vertebral artery flow is antegrade.  Transcranial Dopplers Normal mean flow velocities. Globally elevated pulsatility indices suggest diffuse intracranial atherosclerosis  CXR  01/09/2013   1. The endotracheal tube appears to be in good position. 2. No change in pleural effusions and interstitial edema. 01/11/2013. Slightly increased pulmonary vascular congestion now bordering on mild interstitial edema  2. Interval extubation and removal of nasogastric tube  3. Otherwise similar appearance of small bilateral effusions, bibasilar atelectasis and right hemidiaphragm elevation   EKG  sinus tachycardia.   Therapy Recommendations possible inpatient rehabilitation - screening ordered.  Physical Exam   Pleasant middle aged caucasian lady not in distress.Awake alert. Afebrile. Head is nontraumatic. Neck is supple without bruit. Hearing is normal. Cardiac exam no murmur or gallop. Lungs are clear to auscultation. Distal pulses are well felt. Neurological Exam ;  Awake alert globally aphasic.Speaks a few words and short sentences only. Diminished attention, easy distractibility.   Follows now midline commands.  Extraocular movements are full range without nystagmus. Blinks to threat left more than right. Face is symmetric  without weakness. Tongue is midline. Motor system exam reveals dense right hemiplegia with 0/5 right upper extremity and  3/5 right lower extremity strength. Purposeful antigravity movements on the left. Plantars are downgoing. Sensation is intact.  Gait was not tested.  ASSESSMENT Ms. RANATA CLABORN is a 79 y.o. female presenting with acute confusion in the setting of pancreatitis. Imaging confirms bilateral hypodensities in the anterior and posterior circulation - embolic infarcts likely. PRES  less likelygiven malignant hypertension prior to transfer here.  On aspirin 81 mg orally every day prior to admission. Now on aspirin suppository for secondary stroke prevention.   Malignant hypertension at [facility], leading to dx of possible PRES Hyperlipidemia, LDL 77, not on  statin PTA, at goal LDL < 100  Hospital day # 7  TREATMENT/PLAN  Continue aspirin suppository for secondary stroke prevention.  Ongoing aggressive BP management  Will probably need gastrostomy feeding tube placement.  Will need TEE to evaluate for source of embolic infarcts. If Coumadin is indicated will need to postpone until PEG is placed.  Mobilize out of bed. PT,OT,ST  Rehab consult  D/w daughter  Antony Contras, MD     01/14/2013, 10:31 AM

## 2013-01-14 NOTE — Progress Notes (Signed)
TCD completed. 

## 2013-01-14 NOTE — Progress Notes (Signed)
[facility] Progress Note [facility] TEAM 1 - Stepdown/ICU TEAM   Tzipa Appiah Snelling [id number] DOB: 03/19/1933 DOA: 01/07/2013 PCP: Walker Kehr, MD  Brief narrative: 79 y/o who was admitted to [facility] with acute pancreatitis later diagnosed to be gallstone pancreatitis. She developed oliguric renal failure and malignant HTN at [facility].  She became confused on 5/5 and on 5/6 was noted to have slurred speech and right sided facial weakness. Code Stroke was called but pt was not a candidate for TPA due to inability to pinpoint when she was last seen without these symptoms. MRI revealed embolic infarcts.   5/4 Admitted to [place] with acute pancreatitis  5/4 Abdominal US >>> Gallbladder stones, CBD 4 mm  5/4 Abdomen CT >>> Diffuse acute pancreatitis, CBD mildly dilated, no mass, dilated gallbladder with gallstones, no hydronephrosis  5/5 Transferred to [facility]  5/6 slurred speech and R facial weakness noted >> Code Stroke 5/6 Head CT>>>Scattered gray and white matter edema. Posterior circulation predominant but also left MCA involvement suspected. No associated mass effect or hemorrhage. Top differential considerations include subacute embolic infarcts and posterior reversible encephalopathy syndrome.  5/7 patient intubated due to increasing WOB  5/8 extubated  Assessment/Plan:  Acute gallstone pancreatitis - felt to be related to passed gallstone - will need surgical eval eventually for cholecystectomy (as outpt) - pain resolved -Tolerating tube feeds without difficulty  Acute renal failure -Resolved -baseline Cr about 1.5  CVA -limited MRI but embolic infarcts noted -carotid dopplers without significant stenosis -TTE without noted thrombus -currently on ASA -TEE to be completed by [facility] cardiology 01/15/2013 -SLP following - NPO recommended initially - with some improvement in speech today I am hopeful that the patient's swallowing function will have also improved  - repeat SLP evaluation with modified barium swallow to be accomplished after TEE completed   Malignant HTN BP now well controlled   Hypertrophic cardiomyopathy  -cont B Blocker and maintain adequate preload -EF 80%  Hyperlipidemia   Hypokalemia Replaced via IV to prevent clogging of the NG tube - follow trend - check magnesium  Code Status: FULL Family Communication:  Plan discussed with patient and daughter at bedside Disposition Plan: SDU   Consultants: [facility] cardiology for TEE  DVT prophylaxis: SQ Heparin  HPI/Subjective: The patient is awake and somewhat interactive.  She will follow simple commands.  She answers questions with short often one word answers.  She denies abdominal pain chest pain or shortness of breath.   Objective: Blood pressure 108/27, pulse 101, temperature 98.3 F (36.8 C), temperature source Oral, resp. rate 24, height 5\' 3"  (1.6 m), weight 74.9 kg (165 lb 2 oz), SpO2 93.00%.  Intake/Output Summary (Last 24 hours) at 01/14/13 1500 Last data filed at 01/14/13 1233  Gross per 24 hour  Intake    680 ml  Output   1450 ml  Net   -770 ml    Exam: General: No acute respiratory distress Lungs: Clear to auscultation bilaterally without wheezes or crackles Cardiovascular: Regular rate and rhythm without murmur gallop or rub  Abdomen: Nontender, nondistended, soft, bowel sounds positive, no rebound, no ascites, no appreciable mass Extremities: No significant cyanosis, clubbing, or edema bilateral lower extremities Neuro: verbalizes with one word answers only, follows some commands, right arm 0/5 - moves left side spontaneously  Data Reviewed: Basic Metabolic Panel:  Recent Labs Lab 01/08/13 0535  01/09/13 0405 01/10/13 0355 01/11/13 0500 01/12/13 0340 01/13/13 0600  NA 139  < > 144 144 145 143  141  K 4.5  < > 3.6 3.7 3.7 3.1* 2.9*  CL 109  < > 111 110 109 101 96  CO2 17*  < > 23 23 23 31  37*  GLUCOSE 94  < > 103* 108* 103*  152* 196*  BUN 69*  < > 59* 45* 34* 35* 41*  CREATININE 3.12*  < > 2.13* 1.52* 1.23* 1.21* 1.26*  CALCIUM 7.2*  < > 6.6* 7.2* 7.9* 7.7* 7.3*  MG 1.7  --  1.7  --   --  1.7  --   PHOS  --   --  3.0  --   --   --   --   < > = values in this interval not displayed. Liver Function Tests:  Recent Labs Lab 01/08/13 0501 01/08/13 1500 01/09/13 0405 01/10/13 0355 01/12/13 0340  AST 339* 355* 185* 98* 46*  ALT 188* 259* 185* 136* 81*  ALKPHOS 62 68 67 68 68  BILITOT 0.6 0.6 0.6 0.5 0.5  PROT 5.8* 5.7* 5.1* 5.0* 5.7*  ALBUMIN 1.9* 1.9* 1.6* 1.4* 1.7*    Recent Labs Lab 01/08/13 1500 01/11/13 0500  LIPASE 720* 52  AMYLASE 857*  --    CBC:  Recent Labs Lab 01/08/13 0535 01/09/13 0405 01/10/13 0355 01/11/13 0500 01/12/13 0340  WBC 20.8* 10.6* 11.3* 9.9 8.8  NEUTROABS  --  8.9* 9.4* 7.8* 7.1  HGB 10.3* 9.0* 8.1* 8.2* 8.8*  HCT 29.6* 25.4* 23.2* 23.8* 25.1*  MCV 87.1 87.0 87.5 88.8 88.7  PLT 103* 114* 133* 159 181   Cardiac Enzymes:  Recent Labs Lab 01/07/13 2240 01/08/13 0535  TROPONINI <0.30 <0.30   CBG:  Recent Labs Lab 01/13/13 2018 01/13/13 2359 01/14/13 0529 01/14/13 0721 01/14/13 1227  GLUCAP 171* 185* 122* 128* 123*    Recent Results (from the past 240 hour(s))  MRSA PCR SCREENING     Status: None   Collection Time    01/07/13  9:25 PM      Result Value Range Status   MRSA by PCR NEGATIVE  NEGATIVE Final   Comment:            The GeneXpert MRSA Assay (FDA     approved for NASAL specimens     only), is one component of a     comprehensive MRSA colonization     surveillance program. It is not     intended to diagnose MRSA     infection nor to guide or     monitor treatment for     MRSA infections.  CULTURE, BLOOD (ROUTINE X 2)     Status: None   Collection Time    01/07/13 10:55 PM      Result Value Range Status   Specimen Description BLOOD RIGHT HAND   Final   Special Requests BOTTLES DRAWN AEROBIC ONLY 5CC   Final   Culture  Setup Time  01/08/2013 03:50   Final   Culture NO GROWTH 5 DAYS   Final   Report Status 01/14/2013 FINAL   Final  CULTURE, BLOOD (ROUTINE X 2)     Status: None   Collection Time    01/07/13 11:50 PM      Result Value Range Status   Specimen Description BLOOD LEFT ARM   Final   Special Requests BOTTLES DRAWN AEROBIC ONLY 5CC   Final   Culture  Setup Time 01/08/2013 03:49   Final   Culture NO GROWTH 5 DAYS   Final   Report  Status 01/14/2013 FINAL   Final     Studies:  Recent x-ray studies have been reviewed in detail by the Attending Physician  Scheduled Meds:  Scheduled Meds: . antiseptic oral rinse  15 mL Mouth Rinse QID  . aspirin  325 mg Oral Daily  . chlorhexidine  15 mL Mouth Rinse BID  . feeding supplement (VITAL AF 1.2 CAL)  1,000 mL Per Tube Q24H  . gabapentin  200 mg Oral QHS  . heparin subcutaneous  5,000 Units Subcutaneous Q8H  . hydrochlorothiazide  12.5 mg Oral Daily  . levothyroxine  44 mcg Intravenous Daily  . pantoprazole (PROTONIX) IV  40 mg Intravenous QHS  . potassium chloride  40 mEq Oral Daily   Continuous Infusions: . sodium chloride 20 mL/hr at 01/13/13 1800  . sodium chloride      Time spent on care of this patient: 35 mins   MCCLUNG,JEFFREY T, MD  [facility] Office  [phone] Pager - Text Page per Shea Evans as per below:  On-Call/Text Page:      [email or web address]      password [email or web address]  If 7PM-7AM, please contact night-coverage [email or web address] Password [email or web address] 01/14/2013, 3:00 PM   LOS: 7 days

## 2013-01-14 NOTE — Progress Notes (Addendum)
Physical Therapy Treatment Patient Details Name: [name] [name] MRN: [id number] DOB: 03-02-1933 Today's Date: 01/14/2013 Time: [id number] PT Time Calculation (min): 20 min  PT Assessment / Plan / Recommendation Comments on Treatment Session  Patient much improved over evaluation.  Able to state one word names, able to move right LE, but buckles with weight bearing.  Still with decreased awaress of right side and needs support of right arm for standing balance.  Feel appropriate for inpatient rehab level therapies and will continue to follow acutely.    Follow Up Recommendations  CIR           Equipment Recommendations  Other (comment) (TBA)       Frequency Min 4X/week   Plan Discharge plan remains appropriate    Precautions / Restrictions Precautions Precautions: Fall Precaution Comments: expressive aphasia; dense right UE hemiplegia   Pertinent Vitals/Pain No pain complaints    Mobility  Bed Mobility Bed Mobility: Rolling Right;Right Sidelying to Sit Right Sidelying to Sit: 4: Min assist;With rails Supine to Sit: 2: Max assist;With rails Sitting - Scoot to [employer] of Bed: 3: Mod assist Details for Bed Mobility Assistance: Increased time to respond to command, decreased right side strength, awareness increased time to find COG in sitting Transfers Transfers: Sit to Stand;Stand Pivot Transfers;Stand to Sit Sit to Stand: From bed;1: +2 Total assist Sit to Stand: Patient Percentage: 40% Stand to Sit: 3: Mod assist;With armrests;With upper extremity assist Stand Pivot Transfers: 1: +2 Total assist Stand Pivot Transfers: Patient Percentage: 40% Details for Transfer Assistance: cues for safety, support under right UE, and to right LE due to buckling a little with weight bearing Ambulation/Gait Ambulation/Gait Assistance: Not tested (comment)  Educated family to sit on pt's right side and to encourage her to reach across midline to right side with left UE     PT Goals Acute  Rehab PT Goals Pt will go Supine/Side to Sit: with supervision PT Goal: Supine/Side to Sit - Progress: Progressing toward goal Pt will Sit at [facility] of Bed: with supervision PT Goal: Sit at [facility] Of Bed - Progress: Progressing toward goal Pt will go Sit to Stand: with min assist PT Goal: Sit to Stand - Progress: Progressing toward goal Pt will Transfer Bed to Chair/Chair to Bed: with min assist PT Transfer Goal: Bed to Chair/Chair to Bed - Progress: Progressing toward goal  Visit Information  Last PT Received On: 01/14/13    Subjective Data  Subjective: "[name] [name]," able to state names of family in room   Cognition  Cognition Arousal/Alertness: Awake/alert Behavior During Therapy: [name] for tasks assessed/performed Overall Cognitive Status: Impaired/Different from baseline Area of Impairment: Awareness;Attention Current Attention Level: Sustained Following Commands: Follows one step commands inconsistently;Follows one step commands with increased time Awareness: Intellectual General Comments: decreased right side awareness    Balance  Static Sitting Balance Static Sitting - Balance Support: Feet supported;Left upper extremity supported Static Sitting - Level of Assistance: 5: Stand by assistance Static Sitting - Comment/# of Minutes: sat edge of bed approx 3 minutes while chair being pushed close.  End of Session PT - End of Session Equipment Utilized During Treatment: Gait belt Activity Tolerance: Patient limited by fatigue Patient left: in chair;with call bell/phone within reach Nurse Communication: Mobility status   GP     [facility] 01/14/2013, 4:10 PM [place], [place] 01/14/2013

## 2013-01-14 NOTE — Consult Note (Signed)
Physical Medicine and Rehabilitation Consult Reason for Consult: CVA Referring Physician: [facility]   HPI: [name] [name] is a 79 y.o. right-handed female with history of hypertension. Admitted from [facility] 01/07/2013 with acute pancreatitis. Imaging demonstrated dilated CBD and gallbladder stones. Noted on 01/08/2013 with aphasia/dysphagia and right facial weakness. Neurology services consulted MRI showed bilateral supratentorial and cerebellar acute subacute infarcts. Echocardiogram with ejection fraction of 80% severe septal thickening consistent with hypertrophic cardiomyopathy.  carotid Dopplers with no ICA stenosis. Patient did not receive TPA. Maintained on aspirin therapy with the addition of subcutaneous heparin for DVT prophylaxis. Reported plan for TEE. Patient is n.p.o. we'll follow per speech therapy there is question of possible need for PEG tube. Physical and occupational therapy evaluations completed an ongoing with recommendations for physical medicine rehabilitation consult to consider inpatient rehabilitation services.  Review of Systems  Gastrointestinal: Positive for nausea and vomiting.  Neurological: Positive for dizziness and weakness.  Psychiatric/Behavioral:       Anxiety  All other systems reviewed and are negative.   Past Medical History  Diagnosis Date  . Hypertension   . Hyperlipidemia   . Palpitations   . Thyroid disease   . Weakness generalized   . Anemia   . Other urinary problems   . Lightheadedness   . Dyspnea     w/ atypical upper airway symptoms? all vcd/lpr/gerd  . Breast pain   . Vasovagal syncope   . Cellulitis and abscess of unspecified digit   . Urinary tract infection, site not specified   . Urgency of urination   . Other symptoms involving abdomen and pelvis   . Rash and other nonspecific skin eruption   . Unspecified venous (peripheral) insufficiency   . Edema   . Osteoarthrosis, unspecified whether generalized or localized,  unspecified site   . Monoclonal paraproteinemia   . Irritable bowel syndrome   . Other B-complex deficiencies   . Unspecified hypothyroidism   . GERD (gastroesophageal reflux disease)   . Anxiety   . Torus palatinus    Past Surgical History  Procedure Laterality Date  . Back surgery    . Shoulder surgery    . Cholecystectomy    . Abdominal hysterectomy    . Vaginosacropexy    . Suprapubic bladder suspension     Family History  Problem Relation Age of Onset  . Hypertension Other   . Hypertension Father    Social History:  reports that she has never smoked. She does not have any smokeless tobacco history on file. She reports that she does not drink alcohol or use illicit drugs. Allergies:  Allergies  Allergen Reactions  . Amlodipine Besylate     REACTION: edema  . Cefuroxime Axetil     REACTION: nausea  . Nitrofurantoin     REACTION: nausea  . Sulfadiazine    Medications Prior to Admission  Medication Sig Dispense Refill  . albuterol (PROVENTIL HFA;VENTOLIN HFA) 108 (90 BASE) MCG/ACT inhaler Inhale into the lungs every 4 (four) hours as needed for wheezing.      Marland Kitchen aspirin 81 MG tablet Take 81 mg by mouth daily.        Marland Kitchen BIOTIN 5000 PO Take 5,000 mg by mouth daily.      . cholecalciferol (VITAMIN D) 1000 UNITS tablet Take 1,000 Units by mouth daily.        . folic acid (FOLVITE) [id number] MCG tablet Take 400 mcg by mouth daily.        . hydrochlorothiazide (MICROZIDE)  12.5 MG capsule Take 12.5 mg by mouth daily.      Marland Kitchen levothyroxine (SYNTHROID, LEVOTHROID) 88 MCG tablet Take 1 tablet (88 mcg total) by mouth daily before breakfast.  90 tablet  3  . omeprazole (PRILOSEC) 40 MG capsule Take 1 capsule (40 mg total) by mouth daily.  90 capsule  3  . cyanocobalamin (,VITAMIN B-12,) 1000 MCG/ML injection Inject 1 mL (1,000 mcg total) into the skin every 14 (fourteen) days.  30 mL  1  . gabapentin (NEURONTIN) 100 MG capsule 1-2 at hs prn restless legs  90 capsule  3  .  HYDROcodone-acetaminophen (NORCO) 10-325 MG per tablet Take 1 tablet by mouth every 6 (six) hours as needed.  100 tablet  2    Home: Home Living Lives With: Spouse Available Help at Discharge: Family Type of Home: House Home Access: Stairs to enter [employer] of Steps: 4 Entrance Stairs-Rails: None Home Layout: One level Bathroom Shower/Tub: Tub/shower unit;Walk-in shower Bathroom Toilet: Handicapped height Home Adaptive Equipment: Wheelchair - manual  Functional History: Prior Function Able to Take Stairs?: Yes Driving: Yes Vocation: Retired Functional Status:  Mobility: Bed Mobility Bed Mobility: Supine to Sit;Sitting - Scoot to Edge of Bed Supine to Sit: 1: +2 Total assist;HOB flat Supine to Sit: Patient Percentage: 0% Sitting - Scoot to Edge of Bed: 1: +2 Total assist Sitting - Scoot to Edge of Bed: Patient Percentage: 0% Transfers Transfers: Not assessed Ambulation/Gait Ambulation/Gait Assistance: Not tested (comment)    ADL: ADL Eating/Feeding: NPO Grooming: +1 Total assistance;Wash/dry hands;Wash/dry face;Brushing hair Where Assessed - Grooming: Supported sitting Upper Body Bathing: +1 Total assistance Where Assessed - Upper Body Bathing: Supported sitting Lower Body Bathing: +1 Total assistance Where Assessed - Lower Body Bathing: Supine, head of bed up;Rolling right and/or left Upper Body Dressing: +1 Total assistance Where Assessed - Upper Body Dressing: Supported sitting Lower Body Dressing: +1 Total assistance Where Assessed - Lower Body Dressing: Supine, head of bed up;Rolling right and/or left Toilet Transfer: +1 Total assistance (unable) Transfers/Ambulation Related to ADLs: n/a ADL Comments: Pt unable to follow one step commands to engage in ADL activity  Cognition: Cognition Overall Cognitive Status: Impaired/Different from baseline Arousal/Alertness: Lethargic Orientation Level: Other (comment) (UTA, patient moans but does not  talk) Cognition Arousal/Alertness: Lethargic Behavior During Therapy: Flat affect Overall Cognitive Status: Impaired/Different from baseline Area of Impairment: Attention;Following commands Current Attention Level: Focused Following Commands:  (Follows no commands) General Comments: Pt does not follow commands.  Will open eyes briefly to stimuli Difficult to assess due to: Level of arousal  Blood pressure 133/39, pulse 101, temperature 98 F (36.7 C), temperature source Oral, resp. rate 24, height 5\' 3"  (1.6 m), weight 74.9 kg (165 lb 2 oz), SpO2 93.00%. Physical Exam  Vitals reviewed. Constitutional: She appears well-developed and well-nourished.  79 year old white female with nasogastric tube in place  HENT:  Head: Normocephalic and atraumatic.  Eyes: Conjunctivae and EOM are normal. Pupils are equal, round, and reactive to light.  Pupils reactive to light  Neck: Neck supple. No tracheal deviation present. No thyromegaly present.  Cardiovascular: Normal rate and regular rhythm.   Pulmonary/Chest: Effort normal and breath sounds normal. No respiratory distress. She has no wheezes.  Abdominal: Bowel sounds are normal. She exhibits no distension.  Neurological: She is alert.  Aphasic and would not follow commands. Limited movement of RUE and RLE. Does seem to sense pain.   Skin: Skin is dry.  Psychiatric:  flat    Results for  orders placed during the hospital encounter of 01/07/13 (from the past 24 hour(s))  GLUCOSE, CAPILLARY     Status: Abnormal   Collection Time    01/13/13 11:40 AM      Result Value Range   Glucose-Capillary 182 (*) 70 - 99 mg/dL  GLUCOSE, CAPILLARY     Status: Abnormal   Collection Time    01/13/13  4:19 PM      Result Value Range   Glucose-Capillary 173 (*) 70 - 99 mg/dL   Comment 1 Notify RN    GLUCOSE, CAPILLARY     Status: Abnormal   Collection Time    01/13/13  8:18 PM      Result Value Range   Glucose-Capillary 171 (*) 70 - 99 mg/dL    Comment 1 Notify RN    GLUCOSE, CAPILLARY     Status: Abnormal   Collection Time    01/13/13 11:59 PM      Result Value Range   Glucose-Capillary 185 (*) 70 - 99 mg/dL   Comment 1 Notify RN    GLUCOSE, CAPILLARY     Status: Abnormal   Collection Time    01/14/13  5:29 AM      Result Value Range   Glucose-Capillary 122 (*) 70 - 99 mg/dL   Comment 1 Notify RN     No results found.  Assessment/Plan: Diagnosis: bilateral embolic infarcts 1. Does the need for close, 24 hr/day medical supervision in concert with the patient's rehab needs make it unreasonable for this patient to be served in a less intensive setting? Yes 2. Co-Morbidities requiring supervision/potential complications: pancreatitis, Acute renal failure 3. Due to bladder management, bowel management, safety, skin/wound care, disease management, medication administration and pain management, does the patient require 24 hr/day rehab nursing? Yes 4. Does the patient require coordinated care of a physician, rehab nurse, PT (1-2 hrs/day, 5 days/week), OT (1-2 hrs/day, 5 days/week) and SLP (1-2 hrs/day, 5 days/week) to address physical and functional deficits in the context of the above medical diagnosis(es)? Yes Addressing deficits in the following areas: balance, endurance, locomotion, strength, transferring, bowel/bladder control, bathing, dressing, feeding, grooming, toileting, cognition, speech, language, swallowing and psychosocial support 5. Can the patient actively participate in an intensive therapy program of at least 3 hrs of therapy per day at least 5 days per week? Yes 6. The potential for patient to make measurable gains while on inpatient rehab is excellent and good 7. Anticipated functional outcomes upon discharge from inpatient rehab are mod assist with PT, mod to max assist with OT, mod to max assist with SLP. 8. Estimated rehab length of stay to reach the above functional goals is: 3-4 weeks? 9. Does the patient have  adequate social supports to accommodate these discharge functional goals? Potentially 10. Anticipated D/C setting: Home 11. Anticipated post D/C treatments: [place] therapy 12. Overall Rehab/Functional Prognosis: good  RECOMMENDATIONS: This patient's condition is appropriate for continued rehabilitative care in the following setting: CIR Patient has agreed to participate in recommended program. Yes Note that insurance prior authorization may be required for reimbursement for recommended care.  Comment: Can family provide for projected care needs? Rehab RN to follow up.   Meredith Staggers, MD, Mellody Drown     01/14/2013

## 2013-01-14 NOTE — Progress Notes (Addendum)
NUTRITION FOLLOW UP  Intervention:    Resume Vital AF 1.2 TF after procedure at 40 ml/h, increase by 10 ml every 4 hours to goal rate of 65 ml/h to provide 1872 kcals, 117 gm protein, 1265 ml free water daily.  When IVF discontinued, recommend free water flushes: 150 ml QID.  Nutrition Dx:   Inadequate oral intake related to inability to eat as evidenced by NPO status. Ongoing.  Goal:   Intake to meet >90% of estimated nutrition needs. Unmet.  Monitor:   TF tolerance/adequacy, weight trend, labs, ability to advance PO diet.  Assessment:   Patient was extubated 5/8. Patient with acute pancreatitis with a lot of pain likely related to passed gallstone. NG tube is in place with tip in the gastric fundus. TF rate has been advanced to 40 ml/h since patient's pain has resolved. Patient is tolerating well. SLP working with patient for dysphagia management. Plans for MBS soon. Patient to remain NPO for now until able to complete MBS. Received MD Consult for TF advancement to goal rate. TF on hold today per RN for TEE, which has not yet been completed.    Height: Ht Readings from Last 1 Encounters:  01/08/13 5\' 3"  (1.6 m)    Weight Status:   Wt Readings from Last 1 Encounters:  01/14/13 165 lb 2 oz (74.9 kg)  01/09/13  164 lb 3.9 oz (74.5 kg)  12/21/12  156 lb (70.761 kg)   Weight stable since admission.  Re-estimated needs:  Kcal: 1700-1900 Protein: 110-120 gm Fluid: 1.7-2 L  Skin: no problems noted  Diet Order: NPO  TF Order: Vital AF 1.2 at 40 ml/h   Intake/Output Summary (Last 24 hours) at 01/14/13 1217 Last data filed at 01/14/13 1100  Gross per 24 hour  Intake    860 ml  Output   1150 ml  Net   -290 ml    Last BM: PTA   Labs:   Recent Labs Lab 01/08/13 0535  01/09/13 0405  01/11/13 0500 01/12/13 0340 01/13/13 0600  NA 139  < > 144  < > 145 143 141  K 4.5  < > 3.6  < > 3.7 3.1* 2.9*  CL 109  < > 111  < > 109 101 96  CO2 17*  < > 23  < > 23 31 37*   BUN 69*  < > 59*  < > 34* 35* 41*  CREATININE 3.12*  < > 2.13*  < > 1.23* 1.21* 1.26*  CALCIUM 7.2*  < > 6.6*  < > 7.9* 7.7* 7.3*  MG 1.7  --  1.7  --   --  1.7  --   PHOS  --   --  3.0  --   --   --   --   GLUCOSE 94  < > 103*  < > 103* 152* 196*  < > = values in this interval not displayed.  CBG (last 3)   Recent Labs  01/13/13 2359 01/14/13 0529 01/14/13 0721  GLUCAP 185* 122* 128*    Scheduled Meds: . antiseptic oral rinse  15 mL Mouth Rinse QID  . aspirin  325 mg Oral Daily  . chlorhexidine  15 mL Mouth Rinse BID  . feeding supplement (VITAL AF 1.2 CAL)  1,000 mL Per Tube Q24H  . gabapentin  200 mg Oral QHS  . heparin subcutaneous  5,000 Units Subcutaneous Q8H  . hydrochlorothiazide  12.5 mg Oral Daily  . levothyroxine  44 mcg  Intravenous Daily  . pantoprazole (PROTONIX) IV  40 mg Intravenous QHS  . potassium chloride  40 mEq Oral Daily    Continuous Infusions: . sodium chloride 20 mL/hr at 01/13/13 1800  . sodium chloride      Molli Barrows, RD, LDN, [place] Pager [phone] After Hours Pager [phone]

## 2013-01-14 NOTE — Progress Notes (Signed)
Met with patient, her spouse and her daughter at bedside to discuss rehabilitation options. Family reports that they are interested in [facility], as they are friends with the owners. Explained to family that pt's insurance, [facility] will approve pt for either CIR or SNF, but not for both. Provided CIR handouts and answered questions re: CIR vs SNF. Family is not certain what they want for pt. Noted pt's work-up not complete -- needs TEE. Will follow-up with pt and family. I can be reached at [phone]

## 2013-01-14 NOTE — Progress Notes (Signed)
Pt flushed, afebrile, poss narc flush? Pain med, NPO. Applied cool cloths, will continue to monitor.

## 2013-01-14 NOTE — Progress Notes (Signed)
SLP Cancellation Note  Patient Details Name: [name] [name] MRN: [id number] DOB: Aug 27, 1933   Cancelled treatment:       Reason Eval/Treat Not Completed: Patient at procedure or test/unavailable. Pt NPO for TEE? Per RN TEE may be for 5/14? Will f/u in am.   Herbie Baltimore, [place] CCC-SLP [phone]  Lynann Beaver 01/14/2013, 4:26 PM

## 2013-01-14 NOTE — Progress Notes (Signed)
Rehab Admissions Coordinator Note:  Patient was screened by Cleatrice Burke for appropriateness for an Inpatient Acute Rehab Consult. Rehab consult is pending today.   Cleatrice Burke 01/14/2013, 8:14 AM  I can be reached at [phone].

## 2013-01-15 ENCOUNTER — Inpatient Hospital Stay (HOSPITAL_COMMUNITY): Payer: Medicare Other

## 2013-01-15 ENCOUNTER — Encounter (HOSPITAL_COMMUNITY): Payer: Self-pay | Admitting: Radiology

## 2013-01-15 DIAGNOSIS — R509 Fever, unspecified: Secondary | ICD-10-CM

## 2013-01-15 LAB — GLUCOSE, CAPILLARY
Glucose-Capillary: 98 mg/dL (ref 70–99)
Glucose-Capillary: 108 mg/dL — ABNORMAL HIGH (ref 70–99)
Glucose-Capillary: 110 mg/dL — ABNORMAL HIGH (ref 70–99)
Glucose-Capillary: 142 mg/dL — ABNORMAL HIGH (ref 70–99)

## 2013-01-15 LAB — URINALYSIS, ROUTINE W REFLEX MICROSCOPIC
Glucose, UA: NEGATIVE mg/dL
Leukocytes, UA: NEGATIVE
Protein, ur: 30 mg/dL — AB
Specific Gravity, Urine: 1.019 (ref 1.005–1.030)

## 2013-01-15 LAB — URINE MICROSCOPIC-ADD ON

## 2013-01-15 LAB — BASIC METABOLIC PANEL
CO2: 31 mEq/L (ref 19–32)
Chloride: 103 mEq/L (ref 96–112)
Glucose, Bld: 107 mg/dL — ABNORMAL HIGH (ref 70–99)
Potassium: 4.6 mEq/L (ref 3.5–5.1)
Sodium: 143 mEq/L (ref 135–145)

## 2013-01-15 LAB — MAGNESIUM: Magnesium: 2.1 mg/dL (ref 1.5–2.5)

## 2013-01-15 MED ORDER — BISACODYL 10 MG RE SUPP
10.0000 mg | Freq: Every day | RECTAL | Status: DC | PRN
  Administered 2013-01-16: 10 mg via RECTAL
  Filled 2013-01-15 (×2): qty 1

## 2013-01-15 MED ORDER — SODIUM CHLORIDE 0.9 % IV SOLN
3.0000 g | Freq: Three times a day (TID) | INTRAVENOUS | Status: AC
  Administered 2013-01-15 – 2013-01-19 (×13): 3 g via INTRAVENOUS
  Filled 2013-01-15 (×13): qty 3

## 2013-01-15 MED ORDER — FLEET ENEMA 7-19 GM/118ML RE ENEM
1.0000 | ENEMA | RECTAL | Status: DC | PRN
  Filled 2013-01-15: qty 1

## 2013-01-15 MED ORDER — STARCH (THICKENING) PO POWD
ORAL | Status: DC | PRN
  Filled 2013-01-15: qty 227

## 2013-01-15 MED ORDER — IOHEXOL 350 MG/ML SOLN
100.0000 mL | Freq: Once | INTRAVENOUS | Status: AC | PRN
  Administered 2013-01-15: 100 mL via INTRAVENOUS

## 2013-01-15 MED ORDER — ACETAMINOPHEN 325 MG PO TABS
650.0000 mg | ORAL_TABLET | ORAL | Status: DC | PRN
  Administered 2013-01-23 (×2): 650 mg via ORAL
  Filled 2013-01-15 (×2): qty 2

## 2013-01-15 NOTE — Progress Notes (Signed)
PT Cancellation Note  Patient Details Name: [name] [name] MRN: [id number] DOB: [birth year]   Cancelled Treatment:    Reason Eval/Treat Not Completed: Fatigue/lethargy limiting ability to participate; patient declined out of bed to chair today and RN reports low grade fever and pt not feeling well.  Will try back at later date.   Ivan Maskell,CYNDI 01/15/2013, 1:35 PM

## 2013-01-15 NOTE — Progress Notes (Addendum)
[facility] Progress Note [place] TEAM 1 - Stepdown/ICU TEAM   [name] [name] [id number] DOB: 1933-09-01 DOA: 01/07/2013 PCP: Walker Kehr, MD  Brief narrative: 79 y/o who was admitted to [facility] with acute pancreatitis later diagnosed to be gallstone pancreatitis. She developed oliguric renal failure and malignant HTN at [facility].  She became confused on 5/5 and on 5/6 was noted to have slurred speech and right sided facial weakness. Code Stroke was called but pt was not a candidate for TPA due to inability to pinpoint when she was last seen without these symptoms. MRI revealed embolic infarcts.   5/4 Admitted to [place] with acute pancreatitis  5/4 Abdominal US >>> Gallbladder stones, CBD 4 mm  5/4 Abdomen CT >>> Diffuse acute pancreatitis, CBD mildly dilated, no mass, dilated gallbladder with gallstones, no hydronephrosis  5/5 Transferred to [facility]  5/6 slurred speech and R facial weakness noted >> Code Stroke 5/6 Head CT>>>Scattered gray and white matter edema. Posterior circulation predominant but also left MCA involvement suspected. No associated mass effect or hemorrhage. Top differential considerations include subacute embolic infarcts and posterior reversible encephalopathy syndrome.  5/7 patient intubated due to increasing WOB  5/8 extubated  Assessment/Plan:  Acute gallstone pancreatitis - felt to be related to passed gallstone - will need surgical eval eventually for cholecystectomy (as outpt) - pain resolved -Tolerating tube feeds without difficulty  Acute renal failure -Resolved -baseline Cr about 1.5  CVA -limited MRI but embolic infarcts noted -carotid dopplers without significant stenosis -TTE without noted thrombus -currently on ASA -TEE to be completed by [facility] cardiology today or tomorrow -SLP following - NPO recommended initially - with some improvement in speech today I am hopeful that the patient's swallowing function will have also  improved - r  Dysphagia? -passed SLP eval and d1 with nectar thick ordered per recommendations - increase IVF for today until PO intake improves.    FUO - f/u on blood cx (one culture obtained from central line) - UA and CXR negative for source of infection - hold off on starting antibiotics today unless source found - attempt to find peripheral line and pull central line if able to - check CT chest for PE - sit up in chair as often as possible- pt unable to use IS  Clogged NG tube - removed tube and replaced today (did not expect SLP eval to be completed today)  Malignant HTN BP now well controlled   Hypertrophic cardiomyopathy  -cont B Blocker and maintain adequate preload -EF 80%  Hyperlipidemia   Hypokalemia Replaced via IV to prevent clogging of the NG tube - follow trend - normal magnesium  Code Status: FULL Family Communication:  Plan discussed with patient and daughter at bedside Disposition Plan: SDU   Consultants: Neurology [place] cardiology for TEE  DVT prophylaxis: SQ Heparin  HPI/Subjective: The patient is awake and somewhat interactive.  She will follow simple commands.  She answers questions with short often one word answers.  She denies abdominal pain chest pain or shortness of breath.   Objective: Blood pressure 165/43, pulse 96, temperature 100.2 F (37.9 C), temperature source Oral, resp. rate 24, height 5\' 3"  (1.6 m), weight 71 kg (156 lb 8.4 oz), SpO2 92.00%.  Intake/Output Summary (Last 24 hours) at 01/15/13 1559 Last data filed at 01/15/13 1200  Gross per 24 hour  Intake 1239.17 ml  Output   1750 ml  Net -510.83 ml    Exam: General: No acute respiratory distress Lungs: Clear to  auscultation bilaterally without wheezes or crackles- noted to have a congested cough today Cardiovascular: Regular rate and rhythm without murmur gallop or rub  Abdomen: Nontender, nondistended, soft, bowel sounds positive, no rebound, no ascites, no  appreciable mass Extremities: No significant cyanosis, clubbing, or edema bilateral lower extremities Neuro: verbalizes with one word answers only, follows some commands, right arm 0/5 - moves left side spontaneously  Data Reviewed: Basic Metabolic Panel:  Recent Labs Lab 01/09/13 0405 01/10/13 0355 01/11/13 0500 01/12/13 0340 01/13/13 0600 01/15/13 0530  NA 144 144 145 143 141 143  K 3.6 3.7 3.7 3.1* 2.9* 4.6  CL 111 110 109 101 96 103  CO2 23 23 23 31  37* 31  GLUCOSE 103* 108* 103* 152* 196* 107*  BUN 59* 45* 34* 35* 41* 35*  CREATININE 2.13* 1.52* 1.23* 1.21* 1.26* 1.11*  CALCIUM 6.6* 7.2* 7.9* 7.7* 7.3* 8.0*  MG 1.7  --   --  1.7  --  2.1  PHOS 3.0  --   --   --   --   --    Liver Function Tests:  Recent Labs Lab 01/09/13 0405 01/10/13 0355 01/12/13 0340  AST 185* 98* 46*  ALT 185* 136* 81*  ALKPHOS 67 68 68  BILITOT 0.6 0.5 0.5  PROT 5.1* 5.0* 5.7*  ALBUMIN 1.6* 1.4* 1.7*    Recent Labs Lab 01/11/13 0500  LIPASE 52   CBC:  Recent Labs Lab 01/09/13 0405 01/10/13 0355 01/11/13 0500 01/12/13 0340  WBC 10.6* 11.3* 9.9 8.8  NEUTROABS 8.9* 9.4* 7.8* 7.1  HGB 9.0* 8.1* 8.2* 8.8*  HCT 25.4* 23.2* 23.8* 25.1*  MCV 87.0 87.5 88.8 88.7  PLT 114* 133* 159 181   Cardiac Enzymes: No results found for this basename: CKTOTAL, CKMB, CKMBINDEX, TROPONINI,  in the last 168 hours CBG:  Recent Labs Lab 01/14/13 2007 01/15/13 0004 01/15/13 0403 01/15/13 0840 01/15/13 1147  GLUCAP 112* 116* 98 108* 110*    Recent Results (from the past 240 hour(s))  MRSA PCR SCREENING     Status: None   Collection Time    01/07/13  9:25 PM      Result Value Range Status   MRSA by PCR NEGATIVE  NEGATIVE Final   Comment:            The GeneXpert MRSA Assay (FDA     approved for NASAL specimens     only), is one component of a     comprehensive MRSA colonization     surveillance program. It is not     intended to diagnose MRSA     infection nor to guide or      monitor treatment for     MRSA infections.  CULTURE, BLOOD (ROUTINE X 2)     Status: None   Collection Time    01/07/13 10:55 PM      Result Value Range Status   Specimen Description BLOOD RIGHT HAND   Final   Special Requests BOTTLES DRAWN AEROBIC ONLY 5CC   Final   Culture  Setup Time 01/08/2013 03:50   Final   Culture NO GROWTH 5 DAYS   Final   Report Status 01/14/2013 FINAL   Final  CULTURE, BLOOD (ROUTINE X 2)     Status: None   Collection Time    01/07/13 11:50 PM      Result Value Range Status   Specimen Description BLOOD LEFT ARM   Final   Special Requests BOTTLES DRAWN AEROBIC ONLY 5CC  Final   Culture  Setup Time 01/08/2013 03:49   Final   Culture NO GROWTH 5 DAYS   Final   Report Status 01/14/2013 FINAL   Final     Studies:  Recent x-ray studies have been reviewed in detail by the Attending Physician  Scheduled Meds:  Scheduled Meds: . antiseptic oral rinse  15 mL Mouth Rinse QID  . aspirin  325 mg Per Tube Daily  . chlorhexidine  15 mL Mouth Rinse BID  . feeding supplement (VITAL AF 1.2 CAL)  1,000 mL Per Tube Q24H  . heparin subcutaneous  5,000 Units Subcutaneous Q8H  . lactulose  10 g Per Tube BID  . levothyroxine  88 mcg Per Tube QAC breakfast  . pantoprazole sodium  40 mg Per Tube Daily   Continuous Infusions: . sodium chloride 50 mL/hr at 01/15/13 1413    Time spent on care of this patient: 15 mins   Debbe Odea, [place]  [facility] Office  [phone] Pager - Text Page per Amion as per below:  On-Call/Text Page:      [email or web address]      password [email or web address]  If 7PM-7AM, please contact night-coverage [email or web address] Password [email or web address] 01/15/2013, 3:59 PM   LOS: 8 days

## 2013-01-15 NOTE — Evaluation (Signed)
Speech Language Pathology Evaluation Patient Details Name: NOUF SIPE MRN: [id number] DOB: [birth year] Today's Date: 01/15/2013 Time: [id number] SLP Time Calculation (min): 100 min  Problem List:  Patient Active Problem List   Diagnosis Date Noted  . Acute pancreatitis 01/08/2013  . Acute renal failure 01/08/2013  . Encephalopathy acute 01/08/2013  . CVA (cerebral infarction) 01/08/2013  . Acute respiratory failure 01/08/2013  . UTI (lower urinary tract infection) 09/15/2011  . Abdominal  pain, other specified site 09/15/2011  . ELEVATED BLOOD PRESSURE 09/22/2010  . NEOPLASM UNCERTAIN BHV LIP ORAL CAVITY&PHARYNX 08/02/2010  . ANXIETY 02/12/2010  . DEPRESSION/ANXIETY 02/12/2010  . GERD 09/17/2009  . OSTEOPENIA 09/17/2009  . DYSPNEA 09/17/2009  . Palpitations 07/07/2009  . Mastodynia 11/19/2008  . VASOVAGAL SYNCOPE 11/17/2008  . URINARY TRACT INFECTION (UTI) 10/27/2008  . PARONYCHIA 10/27/2008  . URINARY URGENCY 10/27/2008  . OTHER SYMPTOMS INVOLVING ABDOMEN AND PELVIS 09/26/2008  . VENOUS INSUFFICIENCY 01/01/2008  . RASH AND OTHER NONSPECIFIC SKIN ERUPTION 01/01/2008  . EDEMA 12/21/2007  . HYPERLIPIDEMIA 07/08/2007  . OSTEOARTHRITIS 07/08/2007  . HYPOTHYROIDISM 04/10/2007  . B12 DEFICIENCY 04/10/2007  . MONOCLONAL GAMMOPATHY 04/10/2007  . ANEMIA-NOS 04/10/2007  . HYPERTENSION 04/10/2007  . IBS 04/10/2007   Past Medical History:  Past Medical History  Diagnosis Date  . Hypertension   . Hyperlipidemia   . Palpitations   . Thyroid disease   . Weakness generalized   . Anemia   . Other urinary problems   . Lightheadedness   . Dyspnea     w/ atypical upper airway symptoms? all vcd/lpr/gerd  . Breast pain   . Vasovagal syncope   . Cellulitis and abscess of unspecified digit   . Urinary tract infection, site not specified   . Urgency of urination   . Other symptoms involving abdomen and pelvis   . Rash and other nonspecific skin eruption   . Unspecified venous  (peripheral) insufficiency   . Edema   . Osteoarthrosis, unspecified whether generalized or localized, unspecified site   . Monoclonal paraproteinemia   . Irritable bowel syndrome   . Other B-complex deficiencies   . Unspecified hypothyroidism   . GERD (gastroesophageal reflux disease)   . Anxiety   . Torus palatinus    Past Surgical History:  Past Surgical History  Procedure Laterality Date  . Back surgery    . Shoulder surgery    . Cholecystectomy    . Abdominal hysterectomy    . Vaginosacropexy    . Suprapubic bladder suspension     HPI:  This is a 79 y/o who was admitted to [facility] with acute pancreatitis later diagnosed to be gallstone pancreatitis. She developed oliguric renal failure, malignant HTN at [facility].  Referred for BSE per Stroke Protocol.     Assessment / Plan / Recommendation Clinical Impression  Pt demonstrates a moderate to severe aphasia with expressive and receptive deficits. At this time her attention is impacting her stimulability for cues. She requires verbal and tactile cues to attend to speaker, but while making eye contact, she was able to verbalize in automatic tasks with max cues (name, counting).  She requires additional cueing to follow one step commands (tactile, visual). Suspect pt understands only very basic language. Pt will need continued SLP therapy for functional communication, pt would benefit from CIR consult depending on progress.     SLP Assessment  Patient needs continued Speech Lanaguage Pathology Services    Follow Up Recommendations  Inpatient Rehab    Frequency and Duration  min 2x/week  2 weeks   Pertinent Vitals/Pain NA   SLP Goals  SLP Goals Potential to Achieve Goals: Good SLP Goal #1: Pt will verbalize in automatic speech tasks with max verbal cues in 80% of opportunites.  SLP Goal #1 - Progress: Progressing toward goal SLP Goal #2: Pt will sustain attention to basic functional tasks with moderate verbal cues  for 1 minute. SLP Goal #2 - Progress: Progressing toward goal SLP Goal #3: Pt will follow one stap commands during functional task with mod verbal cues.  SLP Goal #3 - Progress: Progressing toward goal  SLP Evaluation Prior Functioning  Cognitive/Linguistic Baseline: Within functional limits Type of Home: House Lives With: Spouse Available Help at Discharge: Family   Cognition  Overall Cognitive Status: Impaired/Different from baseline Arousal/Alertness: Awake/alert Orientation Level: Oriented to person;Disoriented to place;Disoriented to time;Disoriented to situation Attention: Focused;Sustained Focused Attention: Impaired Focused Attention Impairment: Verbal basic;Functional basic (Needs max verbal/tactile/visual cues to focus attention. ) Sustained Attention: Impaired Sustained Attention Impairment: Verbal basic;Functional basic (once focused sustains only momentarily, 5 seconds. )    Comprehension  Auditory Comprehension Overall Auditory Comprehension: Impaired Yes/No Questions: Impaired Basic Biographical Questions: 26-50% accurate Commands: Impaired One Step Basic Commands: 25-49% accurate    Expression Verbal Expression Overall Verbal Expression: Impaired Initiation: Impaired Automatic Speech: Name;Social Response;Counting Level of Generative/Spontaneous Verbalization: Word Repetition: Impaired Level of Impairment: Word level Naming: Impairment Responsive: Not tested Confrontation: Impaired Convergent: Not tested Verbal Errors: Jargon;Aware of errors Pragmatics: Impairment Impairments: Eye contact Interfering Components: Attention;Speech intelligibility   Oral / Motor Oral Motor/Sensory Function Overall Oral Motor/Sensory Function: Impaired Labial ROM: Reduced right Labial Symmetry: Abnormal symmetry right Labial Strength: Reduced Labial Sensation: Reduced Lingual ROM: Other (Comment) (UTS, pt could not fully protrude tongue with max cues. ) Facial ROM:  Reduced right Facial Symmetry: Right droop Facial Strength: Reduced Facial Sensation: Reduced Motor Speech Overall Motor Speech: Impaired Articulation: Impaired Level of Impairment: Word Intelligibility: Intelligibility reduced Word: 50-74% accurate   GO    Herbie Baltimore, MA CCC-SLP [phone]  Lynann Beaver 01/15/2013, 9:20 AM

## 2013-01-15 NOTE — Progress Notes (Signed)
OT Cancellation Note  Patient Details Name: [name] [name] MRN: [id number] DOB: [birth year]   Cancelled Treatment:    Reason Eval/Treat Not Completed: Other (comment) (pt not feeling well )  [place], Thereasa Parkin 01/15/2013, 12:55 PM

## 2013-01-15 NOTE — Procedures (Signed)
Objective Swallowing Evaluation: Modified Barium Swallowing Study  Patient Details  Name: NYVEAH JUNGMANN MRN: [id number] Date of Birth: 06/03/1933  Today's Date: 01/15/2013 Time: [id number] SLP Time Calculation (min): 27 min  Past Medical History:  Past Medical History  Diagnosis Date  . Hypertension   . Hyperlipidemia   . Palpitations   . Thyroid disease   . Weakness generalized   . Anemia   . Other urinary problems   . Lightheadedness   . Dyspnea     w/ atypical upper airway symptoms? all vcd/lpr/gerd  . Breast pain   . Vasovagal syncope   . Cellulitis and abscess of unspecified digit   . Urinary tract infection, site not specified   . Urgency of urination   . Other symptoms involving abdomen and pelvis   . Rash and other nonspecific skin eruption   . Unspecified venous (peripheral) insufficiency   . Edema   . Osteoarthrosis, unspecified whether generalized or localized, unspecified site   . Monoclonal paraproteinemia   . Irritable bowel syndrome   . Other B-complex deficiencies   . Unspecified hypothyroidism   . GERD (gastroesophageal reflux disease)   . Anxiety   . Torus palatinus    Past Surgical History:  Past Surgical History  Procedure Laterality Date  . Back surgery    . Shoulder surgery    . Cholecystectomy    . Abdominal hysterectomy    . Vaginosacropexy    . Suprapubic bladder suspension     HPI:  This is a 79 y/o who was admitted to [facility] with acute pancreatitis later diagnosed to be gallstone pancreatitis. She developed oliguric renal failure, malignant HTN at [facility].  Referred for BSE per Stroke Protocol.       Assessment / Plan / Recommendation Clinical Impression  Dysphagia Diagnosis: Mild pharyngeal phase dysphagia Clinical impression: Pt presents with a mild oral dysphagia with slow manipulation and mastication of solid textures. Pt is functionally able to consume soft solids, but in order to facilitate intake, will begin with  puree. There is also a mild pharyngeal dysphagia with sensory motor defictis. Pt demosntrate slightly sluggish, incomplete laryngeal closure with trace frank penetration of thin liquids. Pt does not initially sense penetration, but will clear her throat after a few seconds. Nectar thick liquids is improved with only very trace penetration. Recommend pt initate a Dys 1/nectar thick liquid texture with full supervision. Will need pills crushed (pt could nto transit in puree) and she will need to occasionally clear her throat, which she can do on command.     Treatment Recommendation  Therapy as outlined in treatment plan below    Diet Recommendation Dysphagia 1 (Puree);Nectar-thick liquid   Liquid Administration via: Cup;Straw Medication Administration: Crushed with puree Supervision: Full supervision/cueing for compensatory strategies Compensations: Slow rate;Small sips/bites;Clear throat intermittently Postural Changes and/or Swallow Maneuvers: Seated upright 90 degrees    Other  Recommendations Oral Care Recommendations: Oral care BID Other Recommendations: Order thickener from pharmacy   Follow Up Recommendations  Inpatient Rehab    Frequency and Duration min 2x/week  2 weeks   Pertinent Vitals/Pain NA    SLP Swallow Goals Patient will utilize recommended strategies during swallow to increase swallowing safety with: Moderate cueing Swallow Study Goal #2 - Progress: Progressing toward goal   General HPI: This is a 79 y/o who was admitted to [facility] with acute pancreatitis later diagnosed to be gallstone pancreatitis. She developed oliguric renal failure, malignant HTN at [facility].  Referred for BSE  per Stroke Protocol.   Type of Study: Modified Barium Swallowing Study Reason for Referral: Objectively evaluate swallowing function Diet Prior to this Study: NPO;Panda Temperature Spikes Noted: No Respiratory Status: Supplemental O2 delivered via (comment) History of Recent  Intubation: Yes Length of Intubations (days): 1 days Date extubated: 01/10/13 Behavior/Cognition: Alert;Requires cueing Oral Cavity - Dentition: Adequate natural dentition Oral Motor / Sensory Function: Impaired - see Bedside swallow eval Self-Feeding Abilities: Needs assist Patient Positioning: Upright in chair Baseline Vocal Quality: Clear Volitional Cough: Strong Volitional Swallow: Able to elicit Anatomy: Within functional limits Pharyngeal Secretions: Not observed secondary MBS    Reason for Referral Objectively evaluate swallowing function   Oral Phase Oral Preparation/Oral Phase Oral Phase: Impaired Oral - Solids Oral - Puree: Within functional limits Oral - Mechanical Soft: Delayed oral transit (slow manipulation of bolus for mastication)   Pharyngeal Phase Pharyngeal Phase Pharyngeal Phase: Impaired Pharyngeal - Nectar Pharyngeal - Nectar Cup: Reduced airway/laryngeal closure;Penetration/Aspiration during swallow Penetration/Aspiration details (nectar cup): Material enters airway, remains ABOVE vocal cords then ejected out;Material does not enter airway Pharyngeal - Nectar Straw: Reduced airway/laryngeal closure;Penetration/Aspiration during swallow Penetration/Aspiration details (nectar straw): Material enters airway, remains ABOVE vocal cords then ejected out;Material does not enter airway Pharyngeal - Thin Pharyngeal - Thin Cup: Reduced airway/laryngeal closure;Penetration/Aspiration during swallow;Trace aspiration Penetration/Aspiration details (thin cup): Material enters airway, CONTACTS cords and not ejected out;Material enters airway, CONTACTS cords then ejected out;Material does not enter airway Pharyngeal - Thin Straw: Reduced airway/laryngeal closure;Penetration/Aspiration during swallow;Trace aspiration Penetration/Aspiration details (thin straw): Material enters airway, CONTACTS cords and not ejected out;Material enters airway, CONTACTS cords then ejected  out;Material does not enter airway Pharyngeal - Solids Pharyngeal - Puree: Within functional limits Pharyngeal - Mechanical Soft: Within functional limits Pharyngeal - Pill: Not tested (pt could not orall y transit)  Cervical Esophageal Phase    GO             Herbie Baltimore, MA CCC-SLP [phone]  Lynann Beaver 01/15/2013, 3:13 PM

## 2013-01-15 NOTE — Progress Notes (Signed)
Clinical Social Work Department BRIEF PSYCHOSOCIAL ASSESSMENT 01/15/2013  Patient:  [name] [name], [name] [name]     Account Number:  [id number]     Admit date:  01/07/2013  Clinical Social Worker:  Katrinka Blazing  Date/Time:  01/15/2013 10:45 AM  Referred by:  Care Management  Date Referred:  01/15/2013 Referred for  SNF Placement   Other Referral:   Interview type:  Family Other interview type:    PSYCHOSOCIAL DATA Living Status:  FAMILY Admitted from facility:   Level of care:   Primary support name:  Meshelle Dunklee: [phone] Primary support relationship to patient:  SPOUSE Degree of support available:   Family reports spouse with early stages of Dementia.    Hiram Gash Clauson: [phone]    CURRENT CONCERNS Current Concerns  Post-Acute Placement   Other Concerns:    SOCIAL WORK ASSESSMENT / PLAN Clinical Social Worker received referral for potential SNF placement.  CSW reviewed chart and met with pt and pt's dtr at bedside; pt did not participate in this assessment.  CSW introduced self, explained role, and provided support.  CSW reviewed insurance authorization and the need for a disposition plan to be made.  Dtr shared that [facility] is "One mile from pt's house" and shared concern with pt remaining in hospital due to spouse's "early dementia". Dtr shared "He'll want to come and see her everyday and drive".  Spouse arrived to room and hugged this CSW and said, "Hey there".  CSW introduced self to spouse and explained role.  Spouse and dtr to tour CIR and [facility] today.  Dtr to inform this CSW tomorrow morning which facility family would like to pursue.   Assessment/plan status:  Information/Referral to [employer] Other assessment/ plan:   Information/referral to community resources:   CIR  SNF    PATIENT'S/FAMILY'S RESPONSE TO PLAN OF CARE: Pt did not participate in assessment.  Dtr and spouse thanked CSW for intervention.

## 2013-01-15 NOTE — Progress Notes (Signed)
Utilization review completed.  

## 2013-01-15 NOTE — Progress Notes (Signed)
Speech Language Pathology Dysphagia Treatment Patient Details Name: [name] [name] MRN: [id number] DOB: [birth year] Today's Date: 01/15/2013 Time: [id number] SLP Time Calculation (min): 40 min  Assessment / Plan / Recommendation Clinical Impression  Pt continues to demonstrate evidence of possible pharyngeal weakness and suspected residual with multiple swallows and delayed evidence of penetration to the airway. Pt will need MBS prior to initiating diet, but tenuous plan for TEE still in place. Burnetta Sabin will confirm that pt will have TEE today. If not, could complete MBS anytime prior to 2pm.     Diet Recommendation  Continue with Current Diet: NPO    SLP Plan MBS   Pertinent Vitals/Pain NA   Swallowing Goals  SLP Swallowing Goals Goal #3: Consume diagnostic PO trials of various consistencies administered by SLP only to determine PO/MBS readiness.   Swallow Study Goal #3 - Progress: Progressing toward goal  General Temperature Spikes Noted: Yes Respiratory Status: Supplemental O2 delivered via (comment) Behavior/Cognition: Alert;Requires cueing Oral Cavity - Dentition: Adequate natural dentition Patient Positioning: Upright in bed  Oral Cavity - Oral Hygiene Patient is HIGH RISK - Oral Care Protocol followed (see row info): Yes   Dysphagia Treatment Treatment focused on: Upgraded PO texture trials Treatment Methods/Modalities: Skilled observation Patient observed directly with PO's: Yes Type of PO's observed: Dysphagia 1 (puree);Thin liquids Feeding: Needs assist Liquids provided via: Cup Pharyngeal Phase Signs & Symptoms: Multiple swallows;Delayed throat clear;Delayed cough Type of cueing: Verbal;Tactile Amount of cueing: Maximal   GO    Herbie Baltimore, MA CCC-SLP [phone]  Lynann Beaver 01/15/2013, 9:07 AM

## 2013-01-15 NOTE — Progress Notes (Signed)
Stroke Team Progress Note  HISTORY [name] [name] is a 79 y.o. female who was admitted to [facility] 01/06/2013 for pancreatitis. Imaging demonstrated dilated CBD and gallbladder stone. Course was complicated by oliguria and acute encephalopathy.  She became confused yesterday 01/07/2013, but it was attributed to her infection and pain medications. She was transferred to [facility] (arrived 2130). Today 01/08/2013 at 0300, it was noted that she had an increase in her slurred speech and at that time some right sided facial weakness was also noted and therefore a code stroke was called. Patient was not a TPA candidate secondary to time last known well unknown - it was at least prior to transfer to [facility]. She was admitted for further evaluation and treatment.  SUBJECTIVE Daughter reports that patient is more sleepy and had a low temp of 100.3 earlier this AM.  Right upper extremity still plegic  OBJECTIVE Most recent Vital Signs: Filed Vitals:   01/15/13 0420 01/15/13 0555 01/15/13 0630 01/15/13 0839  BP:  128/34  137/62  Pulse:  85  96  Temp: 100.3 F (37.9 C)   99.3 F (37.4 C)  TempSrc: Oral   Oral  Resp:  23  24  Height:      Weight:   156 lb 8.4 oz (71 kg)   SpO2:  94%  92%   CBG (last 3)   Recent Labs  01/15/13 0004 01/15/13 0403 01/15/13 0840  GLUCAP 116* 98 108*    IV Fluid Intake:   . sodium chloride 50 mL/hr at 01/14/13 1901    MEDICATIONS  . antiseptic oral rinse  15 mL Mouth Rinse QID  . aspirin  325 mg Per Tube Daily  . chlorhexidine  15 mL Mouth Rinse BID  . feeding supplement (VITAL AF 1.2 CAL)  1,000 mL Per Tube Q24H  . heparin subcutaneous  5,000 Units Subcutaneous Q8H  . lactulose  10 g Per Tube BID  . levothyroxine  88 mcg Per Tube QAC breakfast  . pantoprazole sodium  40 mg Per Tube Daily   PRN:  albuterol, fentaNYL, labetalol  Diet:  NPO  Activity:  Out of bed to chair DVT Prophylaxis:  SCDs, heparin SQ   CLINICALLY SIGNIFICANT STUDIES Basic Metabolic  Panel:   Recent Labs Lab 01/09/13 0405  01/12/13 0340 01/13/13 0600 01/15/13 0530  NA 144  < > 143 141 143  K 3.6  < > 3.1* 2.9* 4.6  CL 111  < > 101 96 103  CO2 23  < > 31 37* 31  GLUCOSE 103*  < > 152* 196* 107*  BUN 59*  < > 35* 41* 35*  CREATININE 2.13*  < > 1.21* 1.26* 1.11*  CALCIUM 6.6*  < > 7.7* 7.3* 8.0*  MG 1.7  --  1.7  --  2.1  PHOS 3.0  --   --   --   --   < > = values in this interval not displayed. Liver Function Tests:   Recent Labs Lab 01/10/13 0355 01/12/13 0340  AST 98* 46*  ALT 136* 81*  ALKPHOS 68 68  BILITOT 0.5 0.5  PROT 5.0* 5.7*  ALBUMIN 1.4* 1.7*   CBC:   Recent Labs Lab 01/11/13 0500 01/12/13 0340  WBC 9.9 8.8  NEUTROABS 7.8* 7.1  HGB 8.2* 8.8*  HCT 23.8* 25.1*  MCV 88.8 88.7  PLT 159 181   Urinalysis:   Recent Labs Lab 01/11/13 0058  COLORURINE YELLOW  LABSPEC 1.016  PHURINE 5.5  GLUCOSEU  NEGATIVE  HGBUR MODERATE*  BILIRUBINUR NEGATIVE  KETONESUR 40*  PROTEINUR 30*  UROBILINOGEN 0.2  NITRITE NEGATIVE  LEUKOCYTESUR NEGATIVE   Lipid Panel    Component Value Date/Time   CHOL 110 01/08/2013 0535   TRIG 102 01/08/2013 0535   HDL 13* 01/08/2013 0535   CHOLHDL 8.5 01/08/2013 0535   VLDL 20 01/08/2013 0535   LDLCALC 77 01/08/2013 0535   HgbA1C  Lab Results  Component Value Date   HGBA1C 5.7* 01/08/2013    CT of the brain  01/08/2013  Scattered gray and white matter edema.  Posterior circulation predominant but also left MCA involvement suspected.  No associated mass effect or hemorrhage. Top differential considerations include subacute embolic infarcts and posterior reversible encephalopathy syndrome.    MRI of the brain  01/11/13  Bilateral supratentorial and cerebellar acute / subacute infarcts are confirmed.  2. Only diffusion weighted imaging was obtained as the patient was unable tolerate further exam due to difficulty lying flat with cough and risk for aspiration. Marland Kitchen MRA of the brain    2D Echocardiogram  EF 80% There is  severe septal thickening consistent with hypertrophic cardiomyopathy.  Carotid Doppler  Bilateral: No evidence of hemodynamically significant internal carotid artery stenosis. Vertebral artery flow is antegrade.  Transcranial Dopplers Normal mean flow velocities. Globally elevated pulsatility indices suggest diffuse intracranial atherosclerosis  CXR  01/09/2013   1. The endotracheal tube appears to be in good position. 2. No change in pleural effusions and interstitial edema. 01/11/2013. Slightly increased pulmonary vascular congestion now bordering on mild interstitial edema  2. Interval extubation and removal of nasogastric tube  3. Otherwise similar appearance of small bilateral effusions, bibasilar atelectasis and right hemidiaphragm elevation   EKG  sinus tachycardia.   Therapy Recommendations possible inpatient rehabilitation -pending.  Physical Exam   Pleasant middle aged caucasian lady not in distress.Awake alert. Afebrile. Head is nontraumatic. Neck is supple without bruit. Hearing is normal. Cardiac exam no murmur or gallop. Lungs are clear to auscultation. Distal pulses are well felt. Neurological Exam ;  Awake alert globally aphasic.Speaks a few words and short sentences only. Diminished attention, easy distractibility.   Follows now midline commands.  Extraocular movements are full range without nystagmus. Blinks to threat left more than right. Face is symmetric without weakness. Tongue is midline. Motor system exam reveals dense right hemiplegia with 0/5 right upper extremity and  3/5 right lower extremity strength. Purposeful antigravity movements on the left. Plantars are downgoing. Sensation is intact.  Gait was not tested.  ASSESSMENT [name] [name] is a 79 y.o. female presenting with acute confusion in the setting of pancreatitis. Imaging confirms bilateral hypodensities in the anterior and posterior circulation - embolic infarcts likely. PRES  less likelygiven malignant  hypertension prior to transfer here.  On aspirin 81 mg orally every day prior to admission. Now on aspirin suppository for secondary stroke prevention.   Malignant hypertension at [facility], leading to dx of possible PRES Hyperlipidemia, LDL 77, not on statin PTA, at goal LDL < 100  Hospital day # 8  TREATMENT/PLAN  Continue aspirin suppository for secondary stroke prevention.  Ongoing aggressive BP management  Will probably need gastrostomy feeding tube placement.  Will need TEE to evaluate for source of embolic infarcts-- on schedule for 5/14. If Coumadin is indicated will need to postpone until PEG is placed.  Mobilize out of bed. Continue PT,OT,ST  Modified swallow eval today  Awaiting for CIR  D/w daughter and husband  Julius Bowels,  PGy-3 Internal Medicine 01/15/2013, 10:00 AM   I personally examined this patient, reviewed pertinent data the plan of care and agree with the above  Antony Contras, MD

## 2013-01-15 NOTE — Progress Notes (Signed)
ANTIBIOTIC CONSULT NOTE - INITIAL  Pharmacy Consult for Ampicillin/Sulbactam Indication: Possible Aspiration PNA, Fever  Allergies  Allergen Reactions  . Amlodipine Besylate     REACTION: edema  . Cefuroxime Axetil     REACTION: nausea  . Nitrofurantoin     REACTION: nausea  . Sulfadiazine    Patient Measurements: Height: 5\' 3"  (160 cm) Weight: 156 lb 8.4 oz (71 kg) IBW/kg (Calculated) : 52.4  Vital Signs: Temp: 99.7 F (37.6 C) (05/13 1700) Temp src: Oral (05/13 1700) BP: 141/42 mmHg (05/13 1719) Pulse Rate: 80 (05/13 1719) Intake/Output from previous day: 05/12 0701 - 05/13 0700 In: 1189.2 [I.V.:839.2; NG/GT:200; IV Piggyback:150] Out: 1300 [Urine:1300] Intake/Output from this shift:  Labs:  Recent Labs  01/13/13 0600 01/15/13 0530  CREATININE 1.26* 1.11*   Estimated Creatinine Clearance: 39.4 ml/min (by C-G formula based on Cr of 1.11).  Microbiology: Recent Results (from the past 720 hour(s))  MRSA PCR SCREENING     Status: None   Collection Time    01/07/13  9:25 PM      Result Value Range Status   MRSA by PCR NEGATIVE  NEGATIVE Final   Comment:            The GeneXpert MRSA Assay (FDA     approved for NASAL specimens     only), is one component of a     comprehensive MRSA colonization     surveillance program. It is not     intended to diagnose MRSA     infection nor to guide or     monitor treatment for     MRSA infections.  CULTURE, BLOOD (ROUTINE X 2)     Status: None   Collection Time    01/07/13 10:55 PM      Result Value Range Status   Specimen Description BLOOD RIGHT HAND   Final   Special Requests BOTTLES DRAWN AEROBIC ONLY 5CC   Final   Culture  Setup Time 01/08/2013 03:50   Final   Culture NO GROWTH 5 DAYS   Final   Report Status 01/14/2013 FINAL   Final  CULTURE, BLOOD (ROUTINE X 2)     Status: None   Collection Time    01/07/13 11:50 PM      Result Value Range Status   Specimen Description BLOOD LEFT ARM   Final   Special  Requests BOTTLES DRAWN AEROBIC ONLY 5CC   Final   Culture  Setup Time 01/08/2013 03:49   Final   Culture NO GROWTH 5 DAYS   Final   Report Status 01/14/2013 FINAL   Final   Medical History: Past Medical History  Diagnosis Date  . Hypertension   . Hyperlipidemia   . Palpitations   . Thyroid disease   . Weakness generalized   . Anemia   . Other urinary problems   . Lightheadedness   . Dyspnea     w/ atypical upper airway symptoms? all vcd/lpr/gerd  . Breast pain   . Vasovagal syncope   . Cellulitis and abscess of unspecified digit   . Urinary tract infection, site not specified   . Urgency of urination   . Other symptoms involving abdomen and pelvis   . Rash and other nonspecific skin eruption   . Unspecified venous (peripheral) insufficiency   . Edema   . Osteoarthrosis, unspecified whether generalized or localized, unspecified site   . Monoclonal paraproteinemia   . Irritable bowel syndrome   . Other B-complex deficiencies   .  Unspecified hypothyroidism   . GERD (gastroesophageal reflux disease)   . Anxiety   . Torus palatinus    Assessment: 79 yo female initially admitted to [facility] with acute pancreatitis.  She was transferred to [facility] for further work up.  She developed some renal failure, encephalopathy and right sided facial weakness.  She did not received TPA due to time interval.  She has spiked some fever and we are asked to initiate IV antibiotics for potential aspiration pneumonia.  Her renal function has improved with a creatinine of 1.1 and an estimated clearance ~ 40 ml/min.  CXR reveals patchy mixed densitites in the lungs suggestive of infection or inflammation.  Goal of Therapy:  Therapeutic response to IV antibiotics  Plan:  1.  Begin IV Ampicillin/Sulbactam 3 gm every 8 hours 2.  Monitor renal function and adjust dose as needed 3.  F/U culutre data to ensure appropriate antimicrobial coverage.  Rober Minion, PharmD., MS Clinical  Pharmacist Pager:  [phone] Thank you for allowing pharmacy to be part of this patients care team. 01/15/2013,7:22 PM

## 2013-01-16 ENCOUNTER — Encounter (HOSPITAL_COMMUNITY)
Admission: AD | Disposition: A | Discharge status: Skilled Nursing Facility | Payer: Self-pay | Source: Other Acute Inpatient Hospital | Attending: Internal Medicine

## 2013-01-16 DIAGNOSIS — I10 Essential (primary) hypertension: Secondary | ICD-10-CM

## 2013-01-16 DIAGNOSIS — J189 Pneumonia, unspecified organism: Secondary | ICD-10-CM

## 2013-01-16 HISTORY — PX: TEE WITHOUT CARDIOVERSION: SHX5443

## 2013-01-16 LAB — GLUCOSE, CAPILLARY
Glucose-Capillary: 122 mg/dL — ABNORMAL HIGH (ref 70–99)
Glucose-Capillary: 107 mg/dL — ABNORMAL HIGH (ref 70–99)
Glucose-Capillary: 101 mg/dL — ABNORMAL HIGH (ref 70–99)

## 2013-01-16 SURGERY — ECHOCARDIOGRAM, TRANSESOPHAGEAL
Anesthesia: Moderate Sedation

## 2013-01-16 MED ORDER — FENTANYL CITRATE 0.05 MG/ML IJ SOLN
INTRAMUSCULAR | Status: AC
  Filled 2013-01-16: qty 2

## 2013-01-16 MED ORDER — MIDAZOLAM HCL 10 MG/2ML IJ SOLN
INTRAMUSCULAR | Status: DC | PRN
  Administered 2013-01-16: 1 mg via INTRAVENOUS
  Administered 2013-01-16: 2 mg via INTRAVENOUS
  Administered 2013-01-16 (×2): 1 mg via INTRAVENOUS

## 2013-01-16 MED ORDER — PANTOPRAZOLE SODIUM 40 MG PO TBEC
40.0000 mg | DELAYED_RELEASE_TABLET | Freq: Every day | ORAL | Status: DC
  Administered 2013-01-16 – 2013-01-17 (×2): 40 mg via ORAL
  Filled 2013-01-16 (×3): qty 1

## 2013-01-16 MED ORDER — MIDAZOLAM HCL 5 MG/ML IJ SOLN
INTRAMUSCULAR | Status: AC
  Filled 2013-01-16: qty 2

## 2013-01-16 MED ORDER — FENTANYL CITRATE 0.05 MG/ML IJ SOLN
INTRAMUSCULAR | Status: DC | PRN
  Administered 2013-01-16 (×2): 25 ug via INTRAVENOUS

## 2013-01-16 MED ORDER — BUTAMBEN-TETRACAINE-BENZOCAINE 2-2-14 % EX AERO
INHALATION_SPRAY | CUTANEOUS | Status: DC | PRN
  Administered 2013-01-16: 2 via TOPICAL

## 2013-01-16 MED ORDER — ENSURE PUDDING PO PUDG
1.0000 | Freq: Three times a day (TID) | ORAL | Status: DC
  Administered 2013-01-17 – 2013-01-23 (×12): 1 via ORAL

## 2013-01-16 MED ORDER — LIDOCAINE VISCOUS 2 % MT SOLN
OROMUCOSAL | Status: AC
  Filled 2013-01-16: qty 15

## 2013-01-16 NOTE — Interval H&P Note (Signed)
History and Physical Interval Note:  01/16/2013 12:11 PM  [name] [name]  has presented today for surgery, with the diagnosis of stroke  The various methods of treatment have been discussed with the patient and family. After consideration of risks, benefits and other options for treatment, the patient has consented to  Procedure(s): TRANSESOPHAGEAL ECHOCARDIOGRAM (TEE) (N/A) as a surgical intervention .  The patient's history has been reviewed, patient examined, no change in status, stable for surgery.  I have reviewed the patient's chart and labs.  Questions were answered to the patient's satisfaction.     Jenkins Rouge

## 2013-01-16 NOTE — Progress Notes (Signed)
Physical Therapy Treatment Patient Details Name: [name] [name] MRN: [id number] DOB: [birth year] Today's Date: 01/16/2013 Time: 0752-0820 PT Time Calculation (min): 28 min  PT Assessment / Plan / Recommendation Comments on Treatment Session  Pt slowly progressing with mobility at this date, but is very pleasant & willing to participate.  Pt cont's to require significant (A) for bed mobility & bed>chair but is able to maintain sitting balance EOB with SBA<>Min (A).       Follow Up Recommendations  CIR     Does the patient have the potential to tolerate intense rehabilitation     Barriers to Discharge        Equipment Recommendations   (TBD)    Recommendations for Other Services    Frequency Min 4X/week   Plan Discharge plan remains appropriate    Precautions / Restrictions Precautions Precautions: Fall Precaution Comments: expressive aphasia; dense right UE hemiplegia Restrictions Weight Bearing Restrictions: No       Mobility  Bed Mobility Bed Mobility: Rolling Right;Right Sidelying to Sit;Sitting - Scoot to [employer] of Bed Rolling Right: 1: +2 Total assist Rolling Right: Patient Percentage: 10% Rolling Left: 1: +2 Total assist Rolling Left: Patient Percentage: 10% Right Sidelying to Sit: 1: +2 Total assist Right Sidelying to Sit: Patient Percentage: 0% Left Sidelying to Sit: 1: +2 Total assist;HOB flat Left Sidelying to Sit: Patient Percentage: 0% Sitting - Scoot to Edge of Bed: 1: +1 Total assist Details for Bed Mobility Assistance: Max directional cues for technique.  Pt able to bend Lt knee once initiated by therapist but required total (A) for all other components of transitional movement.   Transfers Transfers: Sit to Stand;Stand to Sit;Stand Pivot Transfers Sit to Stand: 1: +2 Total assist;From bed Sit to Stand: Patient Percentage: 40% Stand to Sit: 1: +2 Total assist Stand to Sit: Patient Percentage: 20% Stand Pivot Transfers: 1: +2 Total assist Stand Pivot  Transfers: Patient Percentage: 40% Details for Transfer Assistance: Cues for sequencing & technique.  (A) to lift hips off bed, blocking of Rt knee to prevent buckling, support of R UE, rotation of hips from bed>recliner, & controlled descent.       PT Goals Acute Rehab PT Goals Time For Goal Achievement: 01/25/13 Potential to Achieve Goals: Fair Pt will go Supine/Side to Sit: with supervision PT Goal: Supine/Side to Sit - Progress: Not met Pt will Sit at [facility] of Bed: with supervision PT Goal: Sit at [facility] Of Bed - Progress: Progressing toward goal Pt will go Sit to Stand: with min assist PT Goal: Sit to Stand - Progress: Progressing toward goal Pt will Transfer Bed to Chair/Chair to Bed: with min assist PT Transfer Goal: Bed to Chair/Chair to Bed - Progress: Progressing toward goal  Visit Information  Last PT Received On: 01/16/13 Assistance Needed: +2    Subjective Data      Cognition  Cognition Arousal/Alertness: Awake/alert Behavior During Therapy: [facility] for tasks assessed/performed Overall Cognitive Status: Impaired/Different from baseline Area of Impairment: Attention;Following commands;Problem solving Current Attention Level: Sustained Following Commands: Follows one step commands inconsistently Problem Solving: Slow processing;Decreased initiation;Difficulty sequencing;Requires verbal cues;Requires tactile cues General Comments: Pt answers "yes" to most "yes"/"no" questions most of time.  Follows commands ~25% of time with multimodal cueing.       Balance  Balance Balance Assessed: Yes Static Sitting Balance Static Sitting - Balance Support: No upper extremity supported;Left upper extremity supported;Feet supported Static Sitting - Level of Assistance: 5: Stand by assistance;4: Min assist Static Sitting -  Comment/# of Minutes: (A) varied between [place] (A)<>SBA due to posterior push & leaning to Rt at times.  (A) for midline orientation.    End of Session PT - End of  Session Equipment Utilized During Treatment: Gait belt Patient left: in chair;with call bell/phone within reach;with nursing in room Nurse Communication: Mobility status     Sarajane Marek, [place] [phone] 01/16/2013

## 2013-01-16 NOTE — Progress Notes (Signed)
Occupational Therapy Treatment Note  01/16/13 0800  OT Visit Information  Last OT Received On 01/16/13  Assistance Needed +2  PT/OT Co-Evaluation/Treatment Yes  OT Time Calculation  OT Start Time 0752  OT Stop Time 0820  OT Time Calculation (min) 28 min  Precautions  Precautions Fall  Precaution Comments expressive aphasia; dense right UE hemiplegia  ADL  Eating/Feeding NPO  Grooming Performed;Maximal assistance;Wash/dry face;Brushing hair  Where Assessed - Grooming Unsupported sitting  Lower Body Dressing Performed;+1 Total assistance  Where Assessed - Lower Body Dressing Supported sitting  Toilet Transfer Simulated;+2 Total assistance  Toilet Transfer: Patient Percentage 40%  Radiation protection practitioner (bed to chair)  Equipment Used Gait belt  Transfers/Ambulation Related to ADLs +2 total assist for squat pivot from bed to chair. Manual assist to block Right knee to prevent buckling and to guide hips over to chair.  ADL Comments Max cueing to locate objects on right side. continues to demonstrate right neglect. Pt requiring multimodal cues to initiate and carry through with grooming tasks while sitting EOB. Pt able to follow some one step commands (touch nose, raise L UE) but with increased time. Not consistently following one step commands though.  Cognition  Arousal/Alertness Lethargic  Behavior During Therapy WFL for tasks assessed/performed  Overall Cognitive Status Impaired/Different from baseline  Area of Impairment Awareness;Attention  Current Attention Level Sustained  Following Commands Follows one step commands inconsistently;Follows one step commands with increased time  Bed Mobility  Bed Mobility Rolling Left;Left Sidelying to Sit;Sitting - Scoot to Edge of Bed  Rolling Left 1: +2 Total assist  Rolling Left: Patient Percentage 10%  Left Sidelying to Sit 1: +2 Total assist;HOB flat  Left Sidelying to Sit: Patient Percentage 0%   Sitting - Scoot to Edge of Bed 1: +1 Total assist  Details for Bed Mobility Assistance Assist to initiate and follow through with rolling and to support trunk OOB.   Transfers  Transfers Sit to Stand;Stand to Sit  Sit to Stand 1: +2 Total assist;From bed  Sit to Stand: Patient Percentage 40%  Stand to Sit 1: +2 Total assist;To chair/3-in-1  Stand to Sit: Patient Percentage 20%  Details for Transfer Assistance Multimodal cues for sequencing,  Manual assist to block right knee, support RUE and guide hips to chair.  Pt unable to achieve full upright stand.  Balance  Balance Assessed Yes  Static Sitting Balance  Static Sitting - Balance Support No upper extremity supported;Feet supported  Static Sitting - Level of Assistance 5: Stand by assistance;4: Min assist  Static Sitting - Comment/# of Minutes Pt sat EOB 12 minutes.  Initially requiring min assist due to posterior pushing but then able to obtain balance and midline orientation with stand by assist (stand by ~9 minutes).  OT - End of Session  Equipment Utilized During Treatment Gait belt  Activity Tolerance Patient limited by fatigue  Patient left in chair;with call bell/phone within reach;with nursing in room  Nurse Communication Mobility status  OT Assessment/Plan  Comments on Treatment Session Pt progressing toward goals. Demo'ing improved static sitting balance today.  OT Plan Discharge plan remains appropriate  OT Frequency Min 3X/week  Recommendations for Other Services Rehab consult  Follow Up Recommendations CIR;Supervision/Assistance - 24 hour  ADL Goals  Pt Will Perform Grooming with mod assist;Sitting, chair;Sitting, edge of bed;Supported  ADL Goal: Grooming - Progress Progressing toward goals  Pt Will Transfer to Toilet with max assist;3-in-1;Squat pivot transfer;Stand pivot transfer  ADL  Goal: Toilet Transfer - Progress Progressing toward goals  Additional ADL Goal #2 Pt will follow one step commands consistently  ADL  Goal: Additional Goal #2 - Progress Progressing toward goals  Miscellaneous OT Goals  Miscellaneous OT Goal #1 Pt will sit EOB x 10 mins with min A/facilitation  OT Goal: Miscellaneous Goal #1 - Progress Met  Miscellaneous OT Goal #3 Pt will locate items on Rt. during simple grooming task with min verbal cues  OT Goal: Miscellaneous Goal #3 - Progress Progressing toward goals                01/16/2013 Darrol Jump OTR/L Pager [phone] Office [phone]

## 2013-01-16 NOTE — CV Procedure (Signed)
TEE:  Patient received 5mg  of versed and 50ug of fentanyl Unable to pass probe and concerned with copious  Secretions and  Unprotected airway.  TEE aborted  TTE is normal with CVP catheter in right side of heart   Would not pursue TEE further   [employer]

## 2013-01-16 NOTE — Progress Notes (Signed)
NUTRITION FOLLOW UP  Intervention:    Magic cups TID with all meals. Ensure Pudding PO TID, each supplement provides 170 kcal and 4 grams of protein.   Nutrition Dx:   Inadequate oral intake related to dysphagia as evidenced by poor intake of meals. Ongoing.  Goal:   Intake to meet >90% of estimated nutrition needs. Unmet.  Monitor:   PO intake, weight trend, labs.  Assessment:   S/P MBS with SLP 5/13. Diet advanced to Dysphagia 1 with nectar thick liquids. PO intake has been poor; per discussion with RN, patient consumed ~10% of meals yesterday. Likes the [employer]. S/P TEE today and is very sleepy per RN, therefore, did not eat lunch today. TF has been discontinued.   Height: Ht Readings from Last 1 Encounters:  01/08/13 5\' 3"  (1.6 m)    Weight Status:   Wt Readings from Last 1 Encounters:  01/15/13 156 lb 8.4 oz (71 kg)  01/14/13  165 lb 2 oz (74.9 kg)  01/09/13  164 lb 3.9 oz (74.5 kg)  12/21/12  156 lb (70.761 kg)   Weight stable since admission.  Re-estimated needs:  Kcal: 1700-1900 Protein: 110-120 gm Fluid: 1.7-2 L  Skin: no problems noted  Diet Order: Dysphagia 1 with nectar thick liquids.    Intake/Output Summary (Last 24 hours) at 01/16/13 1429 Last data filed at 01/16/13 1414  Gross per 24 hour  Intake 3233.17 ml  Output   1200 ml  Net 2033.17 ml    Last BM: PTA   Labs:   Recent Labs Lab 01/12/13 0340 01/13/13 0600 01/15/13 0530  NA 143 141 143  K 3.1* 2.9* 4.6  CL 101 96 103  CO2 31 37* 31  BUN 35* 41* 35*  CREATININE 1.21* 1.26* 1.11*  CALCIUM 7.7* 7.3* 8.0*  MG 1.7  --  2.1  GLUCOSE 152* 196* 107*    CBG (last 3)   Recent Labs  01/16/13 0039 01/16/13 0422 01/16/13 0808  GLUCAP 125* 125* 122*    Scheduled Meds: . ampicillin-sulbactam (UNASYN) IV  3 g Intravenous Q8H  . antiseptic oral rinse  15 mL Mouth Rinse QID  . aspirin  325 mg Per Tube Daily  . chlorhexidine  15 mL Mouth Rinse BID  . heparin subcutaneous   5,000 Units Subcutaneous Q8H  . lactulose  10 g Per Tube BID  . levothyroxine  88 mcg Per Tube QAC breakfast  . pantoprazole  40 mg Oral Daily    Continuous Infusions: . sodium chloride 125 mL/hr at 01/16/13 1414    Molli Barrows, RD, LDN, [place] Pager [phone] After Hours Pager [phone]

## 2013-01-16 NOTE — Progress Notes (Signed)
Clinical Social Worker staffed case with [employer].  Per report, family is currently interested in [place].  CSW to continue to follow and assist as needed.    Dala Dock, MSW, [place]

## 2013-01-16 NOTE — Progress Notes (Signed)
Stroke Team Progress Note  HISTORY [name] [name] is a 79 y.o. female who was admitted to [facility] 01/06/2013 for pancreatitis. Imaging demonstrated dilated CBD and gallbladder stone. Course was complicated by oliguria and acute encephalopathy.  She became confused yesterday 01/07/2013, but it was attributed to her infection and pain medications. She was transferred to [facility] (arrived 2130). Today 01/08/2013 at 0300, it was noted that she had an increase in her slurred speech and at that time some right sided facial weakness was also noted and therefore a code stroke was called. Patient was not a TPA candidate secondary to time last known well unknown - it was at least prior to transfer to [facility]. She was admitted for further evaluation and treatment.  SUBJECTIVE Patient is feeling better, no significant event overnight.  OBJECTIVE Most recent Vital Signs: Filed Vitals:   01/16/13 1235 01/16/13 1240 01/16/13 1247 01/16/13 1250  BP: 119/23 119/38 137/42 137/42  Pulse:  73 74 73  Temp:   98.6 F (37 C)   TempSrc:   Oral   Resp: 32 32 22 32  Height:      Weight:      SpO2: 94% 94%  95%   CBG (last 3)   Recent Labs  01/16/13 0039 01/16/13 0422 01/16/13 0808  GLUCAP 125* 125* 122*    IV Fluid Intake:   . sodium chloride 250 mL (01/16/13 1132)    MEDICATIONS  . ampicillin-sulbactam (UNASYN) IV  3 g Intravenous Q8H  . antiseptic oral rinse  15 mL Mouth Rinse QID  . aspirin  325 mg Per Tube Daily  . chlorhexidine  15 mL Mouth Rinse BID  . heparin subcutaneous  5,000 Units Subcutaneous Q8H  . lactulose  10 g Per Tube BID  . levothyroxine  88 mcg Per Tube QAC breakfast  . pantoprazole  40 mg Oral Daily   PRN:  acetaminophen, albuterol, bisacodyl, butamben-tetracaine-benzocaine, fentaNYL, fentaNYL, food thickener, labetalol, midazolam, sodium phosphate  Diet:  Dysphagia  Activity:  Out of bed to chair DVT Prophylaxis:  SCDs, heparin SQ   CLINICALLY SIGNIFICANT STUDIES Basic  Metabolic Panel:   Recent Labs Lab 01/12/13 0340 01/13/13 0600 01/15/13 0530  NA 143 141 143  K 3.1* 2.9* 4.6  CL 101 96 103  CO2 31 37* 31  GLUCOSE 152* 196* 107*  BUN 35* 41* 35*  CREATININE 1.21* 1.26* 1.11*  CALCIUM 7.7* 7.3* 8.0*  MG 1.7  --  2.1   Liver Function Tests:   Recent Labs Lab 01/10/13 0355 01/12/13 0340  AST 98* 46*  ALT 136* 81*  ALKPHOS 68 68  BILITOT 0.5 0.5  PROT 5.0* 5.7*  ALBUMIN 1.4* 1.7*   CBC:   Recent Labs Lab 01/11/13 0500 01/12/13 0340  WBC 9.9 8.8  NEUTROABS 7.8* 7.1  HGB 8.2* 8.8*  HCT 23.8* 25.1*  MCV 88.8 88.7  PLT 159 181   Urinalysis:   Recent Labs Lab 01/11/13 0058 01/15/13 1405  COLORURINE YELLOW YELLOW  LABSPEC 1.016 1.019  PHURINE 5.5 8.0  GLUCOSEU NEGATIVE NEGATIVE  HGBUR MODERATE* TRACE*  BILIRUBINUR NEGATIVE NEGATIVE  KETONESUR 40* 15*  PROTEINUR 30* 30*  UROBILINOGEN 0.2 2.0*  NITRITE NEGATIVE NEGATIVE  LEUKOCYTESUR NEGATIVE NEGATIVE   Lipid Panel    Component Value Date/Time   CHOL 110 01/08/2013 0535   TRIG 102 01/08/2013 0535   HDL 13* 01/08/2013 0535   CHOLHDL 8.5 01/08/2013 0535   VLDL 20 01/08/2013 0535   LDLCALC 77 01/08/2013 0535  HgbA1C  Lab Results  Component Value Date   HGBA1C 5.7* 01/08/2013    CT of the brain  01/08/2013  Scattered gray and white matter edema.  Posterior circulation predominant but also left MCA involvement suspected.  No associated mass effect or hemorrhage. Top differential considerations include subacute embolic infarcts and posterior reversible encephalopathy syndrome.    MRI of the brain  01/11/13  Bilateral supratentorial and cerebellar acute / subacute infarcts are confirmed.  2. Only diffusion weighted imaging was obtained as the patient was unable tolerate further exam due to difficulty lying flat with cough and risk for aspiration. Marland Kitchen MRA of the brain    2D Echocardiogram  EF 80% There is severe septal thickening consistent with hypertrophic cardiomyopathy.  Carotid  Doppler  Bilateral: No evidence of hemodynamically significant internal carotid artery stenosis. Vertebral artery flow is antegrade.  Transcranial Dopplers Normal mean flow velocities. Globally elevated pulsatility indices suggest diffuse intracranial atherosclerosis  CXR  01/09/2013   1. The endotracheal tube appears to be in good position. 2. No change in pleural effusions and interstitial edema. 01/11/2013. Slightly increased pulmonary vascular congestion now bordering on mild interstitial edema  2. Interval extubation and removal of nasogastric tube  3. Otherwise similar appearance of small bilateral effusions, bibasilar atelectasis and right hemidiaphragm elevation   EKG  sinus tachycardia.   Therapy Recommendations - inpatient rehabilitation   Physical Exam   Pleasant middle aged caucasian lady not in distress.Awake alert. Afebrile. Head is nontraumatic. Neck is supple without bruit. Hearing is normal. Cardiac exam no murmur or gallop. Lungs are clear to auscultation. Distal pulses are well felt. Neurological Exam ;  Awake alert globally aphasic.Speaks a few words and short sentences only. Diminished attention, easy distractibility.   Follows now midline commands.  Extraocular movements are full range without nystagmus. Blinks to threat left more than right. Face is symmetric without weakness. Tongue is midline. Motor system exam reveals dense right hemiplegia with 0/5 right upper extremity and  3/5 right lower extremity strength. Purposeful antigravity movements on the left. Plantars are downgoing. Sensation is intact.  Gait was not tested.  ASSESSMENT [name] is a 79 y.o. female presenting with acute confusion in the setting of pancreatitis. Imaging confirms bilateral hypodensities in the anterior and posterior circulation - embolic infarcts likely. PRES  less likelygiven malignant hypertension prior to transfer here. On aspirin 81 mg orally every day prior to admission. Now on  aspirin suppository for secondary stroke prevention.   Malignant hypertension at [facility] Hyperlipidemia, LDL 77, not on statin PTA, at goal LDL < 100  Hospital day # 9  TREATMENT/PLAN  Continue aspirin suppository for secondary stroke prevention.  Ongoing aggressive BP management--improving  Will probably need gastrostomy feeding tube placement.  TEE to evaluate for source of embolic infarcts today by Dr. Johnsie Cancel. If Coumadin is indicated will need to postpone until PEG is placed.  Mobilize out of bed. Continue PT,OT,ST  Modified swallow eval today after TEE  Awaiting for CIR  D/w daughter and husband  Julius Bowels, PGy-3 Internal Medicine 01/16/2013, 1:03 PM   I personally examined this patient, reviewed pertinent data the plan of care and agree with the above Antony Contras, MD

## 2013-01-16 NOTE — Progress Notes (Addendum)
Spoke w/ pt's daughter, Vedia Pereyra.  She says family is meeting together this evening to discuss plans for pt [CIR vs SNF] She is to call in the a.m. w/ their decision.  [phone]

## 2013-01-16 NOTE — Progress Notes (Signed)
[facility] Progress Note [name] TEAM 1 - Stepdown/ICU TEAM   [name] [name] [id number] DOB: 1933-05-11 DOA: 01/07/2013 PCP: Walker Kehr, MD  Brief narrative: 79 y/o who was admitted to [facility] with acute pancreatitis later diagnosed to be gallstone pancreatitis. She developed oliguric renal failure and malignant HTN at [facility]  She became confused on 5/5 and on 5/6 was noted to have slurred speech and right sided facial weakness. Code Stroke was called but pt was not a candidate for TPA due to inability to pinpoint when she was last seen without these symptoms. MRI revealed embolic infarcts.   5/4 Admitted to [place] with acute pancreatitis  5/4 Abdominal US >>> Gallbladder stones, CBD 4 mm  5/4 Abdomen CT >>> Diffuse acute pancreatitis, CBD mildly dilated, no mass, dilated gallbladder with gallstones, no hydronephrosis  5/5 Transferred to [facility]  5/6 slurred speech and R facial weakness noted >> Code Stroke 5/6 Head CT>>>Scattered gray and white matter edema. Posterior circulation predominant but also left MCA involvement suspected. No associated mass effect or hemorrhage. Top differential considerations include subacute embolic infarcts and posterior reversible encephalopathy syndrome.  5/7 patient intubated due to increasing WOB  5/8 extubated  Assessment/Plan:  Acute gallstone pancreatitis - felt to be related to passed gallstone - will need surgical eval eventually for cholecystectomy (as outpt) - pain resolved  Acute renal failure -Resolved -baseline Cr about 1.5  CVA -limited MRI but embolic infarcts noted -carotid dopplers without significant stenosis -TTE without noted thrombus -currently on ASA -TEE could not be completed due to excessive secretions in airway.   Dysphagia -passed SLP eval and d1 with nectar thick ordered per recommendations - cont slow hydration until PO intake is adequate  Pneumonia - possible aspiration- covering with  Unasyn for now- fevers resolved and O2 requirements have decreased from 3 to 1 L - follow need to broaden antibiotics  Clogged NG tube - removed   Malignant HTN BP now well controlled   Hypertrophic cardiomyopathy  -cont B Blocker and maintain adequate preload -EF 80%  Hyperlipidemia   Hypokalemia Replaced via IV to prevent clogging of the NG tube - follow trend - normal magnesium  Code Status: FULL Family Communication:  Plan discussed with patient and daughter at bedside Disposition Plan: SDU   Consultants: Neurology [place] cardiology for TEE  DVT prophylaxis: SQ Heparin  HPI/Subjective: The patient is awake and more interactive- continues to have congested cough.    Objective: Blood pressure 115/36, pulse 72, temperature 97.9 F (36.6 C), temperature source Axillary, resp. rate 25, height 5\' 3"  (1.6 m), weight 71 kg (156 lb 8.4 oz), SpO2 94.00%.  Intake/Output Summary (Last 24 hours) at 01/16/13 1537 Last data filed at 01/16/13 1414  Gross per 24 hour  Intake 3233.17 ml  Output   1200 ml  Net 2033.17 ml    Exam: General: No acute respiratory distress Lungs: Clear to auscultation bilaterally without wheezes or crackles- noted to have a congested cough  Cardiovascular: Regular rate and rhythm without murmur gallop or rub  Abdomen: Nontender, nondistended, soft, bowel sounds positive, no rebound, no ascites, no appreciable mass Extremities: No significant cyanosis, clubbing, or edema bilateral lower extremities Neuro: verbalizes with one word answers only, follows some commands, right arm 0/5 - moves left side spontaneously  Data Reviewed: Basic Metabolic Panel:  Recent Labs Lab 01/10/13 0355 01/11/13 0500 01/12/13 0340 01/13/13 0600 01/15/13 0530  NA 144 145 143 141 143  K 3.7 3.7 3.1* 2.9* 4.6  CL  110 109 101 96 103  CO2 23 23 31  37* 31  GLUCOSE 108* 103* 152* 196* 107*  BUN 45* 34* 35* 41* 35*  CREATININE 1.52* 1.23* 1.21* 1.26* 1.11*  CALCIUM  7.2* 7.9* 7.7* 7.3* 8.0*  MG  --   --  1.7  --  2.1   Liver Function Tests:  Recent Labs Lab 01/10/13 0355 01/12/13 0340  AST 98* 46*  ALT 136* 81*  ALKPHOS 68 68  BILITOT 0.5 0.5  PROT 5.0* 5.7*  ALBUMIN 1.4* 1.7*    Recent Labs Lab 01/11/13 0500  LIPASE 52   CBC:  Recent Labs Lab 01/10/13 0355 01/11/13 0500 01/12/13 0340  WBC 11.3* 9.9 8.8  NEUTROABS 9.4* 7.8* 7.1  HGB 8.1* 8.2* 8.8*  HCT 23.2* 23.8* 25.1*  MCV 87.5 88.8 88.7  PLT 133* 159 181   Cardiac Enzymes: No results found for this basename: CKTOTAL, CKMB, CKMBINDEX, TROPONINI,  in the last 168 hours CBG:  Recent Labs Lab 01/15/13 1658 01/15/13 2030 01/16/13 0039 01/16/13 0422 01/16/13 0808  GLUCAP 123* 142* 125* 125* 122*    Recent Results (from the past 240 hour(s))  MRSA PCR SCREENING     Status: None   Collection Time    01/07/13  9:25 PM      Result Value Range Status   MRSA by PCR NEGATIVE  NEGATIVE Final   Comment:            The GeneXpert MRSA Assay (FDA     approved for NASAL specimens     only), is one component of a     comprehensive MRSA colonization     surveillance program. It is not     intended to diagnose MRSA     infection nor to guide or     monitor treatment for     MRSA infections.  CULTURE, BLOOD (ROUTINE X 2)     Status: None   Collection Time    01/07/13 10:55 PM      Result Value Range Status   Specimen Description BLOOD RIGHT HAND   Final   Special Requests BOTTLES DRAWN AEROBIC ONLY 5CC   Final   Culture  Setup Time 01/08/2013 03:50   Final   Culture NO GROWTH 5 DAYS   Final   Report Status 01/14/2013 FINAL   Final  CULTURE, BLOOD (ROUTINE X 2)     Status: None   Collection Time    01/07/13 11:50 PM      Result Value Range Status   Specimen Description BLOOD LEFT ARM   Final   Special Requests BOTTLES DRAWN AEROBIC ONLY 5CC   Final   Culture  Setup Time 01/08/2013 03:49   Final   Culture NO GROWTH 5 DAYS   Final   Report Status 01/14/2013 FINAL    Final     Studies:  Recent x-ray studies have been reviewed in detail by the Attending Physician  Scheduled Meds:  Scheduled Meds: . ampicillin-sulbactam (UNASYN) IV  3 g Intravenous Q8H  . antiseptic oral rinse  15 mL Mouth Rinse QID  . aspirin  325 mg Per Tube Daily  . chlorhexidine  15 mL Mouth Rinse BID  . feeding supplement  1 Container Oral TID BM  . heparin subcutaneous  5,000 Units Subcutaneous Q8H  . lactulose  10 g Per Tube BID  . levothyroxine  88 mcg Per Tube QAC breakfast  . pantoprazole  40 mg Oral Daily   Continuous Infusions: . sodium  chloride 125 mL/hr at 01/16/13 1414    Time spent on care of this patient: 60 mins   Debbe Odea, [name]  [facility] Office  [phone] Pager - Text Page per Amion as per below:  On-Call/Text Page:      [email or web address]      password [email or web address]  If 7PM-7AM, please contact night-coverage [email or web address] Password [email or web address] 01/16/2013, 3:37 PM   LOS: 9 days

## 2013-01-16 NOTE — Progress Notes (Signed)
Speech Language Pathology Dysphagia Treatment Patient Details Name: [name] [name] MRN: [id number] DOB: October 03, 1932 Today's Date: 01/16/2013 Time: [id number] SLP Time Calculation (min): 35 min  Assessment / Plan / Recommendation Clinical Impression  Pt seen briefly with two sips of nectar thick liquids with pills given by RN. Otherwise she is NPO for TEE today. Pt demonstrated good awareness of POs and tolerance. Will f/u tomorrow for observation with meal and reinforcement of throat clear strategy.   SLP also provided max verbal and visual cues to facilitate receptive language. Pt continues to struggle to attend, due to fever, constipation. However she did demonstrate 100% accuracy with basic biographical questions, would not attend to basic enviornmental questions to test. Pt would not follow any distal commands with max cues, but did follow through with basic ADL with max tactile cues (brushing hair). She followed 4/4 oral motor commands with single verbal cues. Much more verbal with automatic question cues, name, daugther's name, yes/no questions regarding pain. Suspect attention playing a role in reducing pts attention to more complex information. Pt will need intensive cognitve linguistic rehab, recommend CIR.     Diet Recommendation  Continue with Current Diet: Dysphagia 1 (puree);Nectar-thick liquid    SLP Plan Continue with current plan of care   Pertinent Vitals/Pain NA   Swallowing Goals  SLP Swallowing Goals Patient will utilize recommended strategies during swallow to increase swallowing safety with: Moderate cueing Swallow Study Goal #2 - Progress: Progressing toward goal  General Temperature Spikes Noted: Yes Respiratory Status: Supplemental O2 delivered via (comment) Behavior/Cognition: Alert;Requires cueing Oral Cavity - Dentition: Adequate natural dentition Patient Positioning: Upright in chair  Oral Cavity - Oral Hygiene Patient is AT RISK - Oral Care Protocol followed  (see row info): Yes   Dysphagia Treatment Treatment focused on: Skilled observation of diet tolerance;Patient/family/caregiver Research officer, political party Educated: daughter [name] [name] Treatment Methods/Modalities: Skilled observation Patient observed directly with PO's: Yes Type of PO's observed: Nectar-thick liquids Feeding: Total assist Liquids provided via: Cup Type of cueing: Verbal;Tactile Amount of cueing: Minimal   GO     [name] [name], [name] [name] 01/16/2013, 9:55 AM

## 2013-01-17 ENCOUNTER — Inpatient Hospital Stay (HOSPITAL_COMMUNITY): Payer: Medicare Other

## 2013-01-17 ENCOUNTER — Encounter (HOSPITAL_COMMUNITY): Payer: Self-pay | Admitting: Cardiovascular Disease

## 2013-01-17 DIAGNOSIS — I422 Other hypertrophic cardiomyopathy: Secondary | ICD-10-CM | POA: Diagnosis present

## 2013-01-17 DIAGNOSIS — I10 Essential (primary) hypertension: Secondary | ICD-10-CM | POA: Diagnosis not present

## 2013-01-17 DIAGNOSIS — R1310 Dysphagia, unspecified: Secondary | ICD-10-CM | POA: Diagnosis not present

## 2013-01-17 LAB — BASIC METABOLIC PANEL
Calcium: 7.9 mg/dL — ABNORMAL LOW (ref 8.4–10.5)
Creatinine, Ser: 1.02 mg/dL (ref 0.50–1.10)
GFR calc Af Amer: 59 mL/min — ABNORMAL LOW (ref >90)
GFR calc non Af Amer: 51 mL/min — ABNORMAL LOW (ref >90)
Sodium: 145 mEq/L (ref 135–145)

## 2013-01-17 LAB — GLUCOSE, CAPILLARY: Glucose-Capillary: 106 mg/dL — ABNORMAL HIGH (ref 70–99)

## 2013-01-17 LAB — CBC
MCH: 30.1 pg (ref 26.0–34.0)
MCHC: 32.8 g/dL (ref 30.0–36.0)
MCV: 91.9 fL (ref 78.0–100.0)
Platelets: 435 10*3/uL — ABNORMAL HIGH (ref 150–400)
RBC: 2.59 MIL/uL — ABNORMAL LOW (ref 3.87–5.11)
RDW: 15 % (ref 11.5–15.5)

## 2013-01-17 NOTE — Progress Notes (Signed)
Pt admitted to the unit with dx cva, pna. Pt alert and oriented x3. Ambulatory x1 assist. High fall risk bed alarm in place. Placed on telemetry. Oriented to staff and unit. Vital signs stable. Family at bedside. Will cont to monitor.

## 2013-01-17 NOTE — Progress Notes (Signed)
Patient has been transferred to [facility] via wheelchair. Phone report was called to Ty,RN. Patient and family are aware of the transfer. And patient's daughter followed [place] over.

## 2013-01-17 NOTE — Progress Notes (Signed)
Pt's family has decided on SNF at [place] for pt's f/up therapy.  CIR will sign off.  [phone]

## 2013-01-17 NOTE — Progress Notes (Signed)
Physical Therapy Treatment Patient Details Name: [name] [name] MRN: [id number] DOB: 08/19/1933 Today's Date: 01/17/2013 Time: [id number] PT Time Calculation (min): 29 min  PT Assessment / Plan / Recommendation Comments on Treatment Session  Patient progressing with standing and taking steps today with walker.  Still right UE hemiplegic and heavy, but better balance when supported by walker.  Good rehab candidate.    Follow Up Recommendations  CIR     Does the patient have the potential to tolerate intense rehabilitation   Yes  Barriers to Discharge  None      Equipment Recommendations  Other (comment) (TBA)    Recommendations for Other Services    Frequency Min 4X/week   Plan Discharge plan remains appropriate    Precautions / Restrictions Precautions Precautions: Fall Precaution Comments: expressive aphasia; dense right UE hemiplegia   Pertinent Vitals/Pain Min c/o abdominal pain during BM    Mobility  Bed Mobility Supine to Sit: HOB elevated;2: Max assist Sitting - Scoot to Edge of Bed: 2: Max assist Details for Bed Mobility Assistance: able to initiate moving legs to edge of bed with lots of extra time and three cues. Transfers Sit to Stand: From chair/3-in-1;With armrests;2: Max assist Stand to Sit: 2: Max assist Stand Pivot Transfers: 1: +2 Total assist;2: Max assist Stand Pivot Transfers: Patient Percentage: 40% Details for Transfer Assistance: bed to [facility] with two assist, cues for stepping and hand placement, BSC to recliner with RW and max of one.  Able to take side steps with walker right knee buckling to recliner. Ambulation/Gait Ambulation/Gait Assistance: 2: Max assist Ambulation Distance (Feet): 1 Feet Assistive device: Rolling walker Ambulation/Gait Assistance Details: side steps to chair from [facility]      PT Goals Acute Rehab PT Goals Pt will go Supine/Side to Sit: with supervision PT Goal: Supine/Side to Sit - Progress: Progressing toward goal Pt will  Sit at [facility] of Bed: with supervision PT Goal: Sit at [facility] Of Bed - Progress: Progressing toward goal Pt will go Sit to Stand: with min assist PT Goal: Sit to Stand - Progress: Progressing toward goal Pt will Transfer Bed to Chair/Chair to Bed: with min assist PT Transfer Goal: Bed to Chair/Chair to Bed - Progress: Progressing toward goal  Visit Information  Last PT Received On: 01/17/13    Subjective Data  Subjective: I don't feel good.   Cognition  Cognition Arousal/Alertness: Awake/alert Behavior During Therapy: WFL for tasks assessed/performed Overall Cognitive Status: Impaired/Different from baseline Area of Impairment: Attention;Problem solving Following Commands: Follows one step commands inconsistently Problem Solving: Slow processing;Decreased initiation;Requires verbal cues;Requires tactile cues General Comments: improving but still limited due to global aphasia    Balance  Static Standing Balance Static Standing - Balance Support: Bilateral upper extremity supported;During functional activity Static Standing - Level of Assistance: 3: Mod assist Static Standing - Comment/# of Minutes: standing for hygiene after toileting with mod assist, cues for upright posture about 1 1/2 minutes  End of Session PT - End of Session Equipment Utilized During Treatment: Gait belt Activity Tolerance: Patient limited by fatigue Patient left: in chair;with call bell/phone within reach Nurse Communication: Mobility status   GP     [facility] 01/17/2013, 5:29 PM [place], [place] 01/17/2013

## 2013-01-17 NOTE — Progress Notes (Signed)
Stroke Team Progress Note  HISTORY [name] [name] is a 79 y.o. female who was admitted to [facility] 01/06/2013 for pancreatitis. Imaging demonstrated dilated CBD and gallbladder stone. Course was complicated by oliguria and acute encephalopathy.  She became confused yesterday 01/07/2013, but it was attributed to her infection and pain medications. She was transferred to [facility] (arrived 2130). Today 01/08/2013 at 0300, it was noted that she had an increase in her slurred speech and at that time some right sided facial weakness was also noted and therefore a code stroke was called. Patient was not a TPA candidate secondary to time last known well unknown - it was at least prior to transfer to [facility]. She was admitted for further evaluation and treatment.  SUBJECTIVE No significant event overnight. TEE was unable to perform due to copious secretion.    OBJECTIVE Most recent Vital Signs: Filed Vitals:   01/16/13 1600 01/16/13 2000 01/17/13 0000 01/17/13 0321  BP: 124/40 146/52 165/54 169/57  Pulse: 74 83 87 88  Temp: 98 F (36.7 C) 99.1 F (37.3 C) 99.6 F (37.6 C) 99.5 F (37.5 C)  TempSrc: Axillary Oral Oral Oral  Resp: 27 25 31 30   Height:      Weight:      SpO2: 92% 95% 94% 94%   CBG (last 3)   Recent Labs  01/16/13 1931 01/16/13 2358 01/17/13 0320  GLUCAP 101* 109* 104*    IV Fluid Intake:   . sodium chloride 50 mL/hr at 01/17/13 0700    MEDICATIONS  . ampicillin-sulbactam (UNASYN) IV  3 g Intravenous Q8H  . antiseptic oral rinse  15 mL Mouth Rinse QID  . aspirin  325 mg Per Tube Daily  . chlorhexidine  15 mL Mouth Rinse BID  . feeding supplement  1 Container Oral TID BM  . heparin subcutaneous  5,000 Units Subcutaneous Q8H  . lactulose  10 g Per Tube BID  . levothyroxine  88 mcg Per Tube QAC breakfast  . pantoprazole  40 mg Oral Daily   PRN:  acetaminophen, albuterol, bisacodyl, fentaNYL, food thickener, labetalol, sodium phosphate  Diet:  Dysphagia  Activity:  Out  of bed to chair DVT Prophylaxis:  SCDs, heparin SQ   CLINICALLY SIGNIFICANT STUDIES Basic Metabolic Panel:   Recent Labs Lab 01/12/13 0340  01/15/13 0530 01/17/13 0330  NA 143  < > 143 145  K 3.1*  < > 4.6 3.9  CL 101  < > 103 110  CO2 31  < > 31 24  GLUCOSE 152*  < > 107* 106*  BUN 35*  < > 35* 25*  CREATININE 1.21*  < > 1.11* 1.02  CALCIUM 7.7*  < > 8.0* 7.9*  MG 1.7  --  2.1  --   < > = values in this interval not displayed. Liver Function Tests:   Recent Labs Lab 01/12/13 0340  AST 46*  ALT 81*  ALKPHOS 68  BILITOT 0.5  PROT 5.7*  ALBUMIN 1.7*   CBC:   Recent Labs Lab 01/11/13 0500 01/12/13 0340 01/17/13 0330  WBC 9.9 8.8 10.6*  NEUTROABS 7.8* 7.1  --   HGB 8.2* 8.8* 7.8*  HCT 23.8* 25.1* 23.8*  MCV 88.8 88.7 91.9  PLT 159 181 435*   Urinalysis:   Recent Labs Lab 01/11/13 0058 01/15/13 1405  COLORURINE YELLOW YELLOW  LABSPEC 1.016 1.019  PHURINE 5.5 8.0  GLUCOSEU NEGATIVE NEGATIVE  HGBUR MODERATE* TRACE*  BILIRUBINUR NEGATIVE NEGATIVE  KETONESUR 40* 15*  PROTEINUR  30* 30*  UROBILINOGEN 0.2 2.0*  NITRITE NEGATIVE NEGATIVE  LEUKOCYTESUR NEGATIVE NEGATIVE   Lipid Panel    Component Value Date/Time   CHOL 110 01/08/2013 0535   TRIG 102 01/08/2013 0535   HDL 13* 01/08/2013 0535   CHOLHDL 8.5 01/08/2013 0535   VLDL 20 01/08/2013 0535   LDLCALC 77 01/08/2013 0535   HgbA1C  Lab Results  Component Value Date   HGBA1C 5.7* 01/08/2013    CT of the brain  01/08/2013  Scattered gray and white matter edema.  Posterior circulation predominant but also left MCA involvement suspected.  No associated mass effect or hemorrhage. Top differential considerations include subacute embolic infarcts and posterior reversible encephalopathy syndrome.    MRI of the brain  01/11/13  Bilateral supratentorial and cerebellar acute / subacute infarcts are confirmed.  2. Only diffusion weighted imaging was obtained as the patient was unable tolerate further exam due to difficulty  lying flat with cough and risk for aspiration. Marland Kitchen MRA of the brain    2D Echocardiogram  EF 80% There is severe septal thickening consistent with hypertrophic cardiomyopathy.  Carotid Doppler  Bilateral: No evidence of hemodynamically significant internal carotid artery stenosis. Vertebral artery flow is antegrade.  Transcranial Dopplers Normal mean flow velocities. Globally elevated pulsatility indices suggest diffuse intracranial atherosclerosis  CXR  01/09/2013   1. The endotracheal tube appears to be in good position. 2. No change in pleural effusions and interstitial edema. 01/11/2013. Slightly increased pulmonary vascular congestion now bordering on mild interstitial edema  2. Interval extubation and removal of nasogastric tube  3. Otherwise similar appearance of small bilateral effusions, bibasilar atelectasis and right hemidiaphragm elevation   EKG  sinus tachycardia.   Therapy Recommendations - inpatient rehabilitation   Physical Exam   Pleasant middle aged caucasian lady not in distress.Awake alert. Afebrile. Head is nontraumatic. Neck is supple without bruit. Hearing is normal. Cardiac exam no murmur or gallop. Lungs are clear to auscultation. Distal pulses are well felt. Neurological Exam ;  Awake alert globally aphasic.Speaks a few words and short sentences only. Diminished attention, easy distractibility.   Follows now midline commands.  Extraocular movements are full range without nystagmus. Blinks to threat left more than right. Face is symmetric without weakness. Tongue is midline. Motor system exam reveals dense right hemiplegia with 0/5 right upper extremity and  3/5 right lower extremity strength. Purposeful antigravity movements on the left. Plantars are downgoing. Sensation is intact.  Gait was not tested.  ASSESSMENT [name] [name] is a 79 y.o. female presenting with acute confusion in the setting of pancreatitis. Imaging confirms bilateral hypodensities in the anterior  and posterior circulation - embolic infarcts likely.  On aspirin 81 mg orally every day prior to admission. Now on aspirin suppository for secondary stroke prevention.   Malignant hypertension at [facility] Hyperlipidemia, LDL 77, not on statin PTA, at goal LDL < 100  Hospital day # 10  TREATMENT/PLAN  Continue aspirin suppository for secondary stroke prevention.  Ongoing aggressive BP management--improving  Mobilize out of bed. Continue PT,OT,ST  [facility]  Will not re-attempt TEE again but recommend 3wks heart monitor as outpatient  D/w daughter and husband  Will sign off. Continue current management and follow up with Dr. Leonie Man in 2 months  Julius Bowels, PGy-3 Internal Medicine 01/17/2013, 8:19 AM  I have personally examined this patient, reviewed pertinent data, the above plan of care and agree with the above Antony Contras, MD

## 2013-01-17 NOTE — Progress Notes (Signed)
[facility] Progress Note [place] TEAM 1 - Stepdown/ICU TEAM   [name] [name] [id number] DOB: 1933-06-28 DOA: 01/07/2013 PCP: Walker Kehr, MD  Brief narrative: 79 y/o who was admitted to [facility] with acute pancreatitis later diagnosed to be gallstone pancreatitis. She developed oliguric renal failure and malignant HTN at [facility].  She became confused on 5/5 and on 5/6 was noted to have slurred speech and right sided facial weakness. Code Stroke was called but pt was not a candidate for TPA due to inability to pinpoint when she was last seen without these symptoms. MRI revealed embolic infarcts.   5/4 Admitted to [facility] with acute pancreatitis  5/4 Abdominal US >>> Gallbladder stones, CBD 4 mm  5/4 Abdomen CT >>> Diffuse acute pancreatitis, CBD mildly dilated, no mass, dilated gallbladder with gallstones, no hydronephrosis  5/5 Transferred to [facility]  5/6 slurred speech and R facial weakness noted >> Code Stroke 5/6 Head CT>>>Scattered gray and white matter edema. Posterior circulation predominant but also left MCA involvement suspected. No associated mass effect or hemorrhage. Top differential considerations include subacute embolic infarcts and posterior reversible encephalopathy syndrome.  5/7 patient intubated due to increasing WOB  5/8 extubated  Assessment/Plan:  Acute gallstone pancreatitis - felt to be related to passed gallstone - will need surgical eval eventually for cholecystectomy (as outpt) - pain resolved  Acute renal failure -Resolved but pt still dehydrated based upon BUN/cr ratio -baseline Cr about 1.5  CVA/ dysarthria -limited MRI but embolic infarcts noted 2D Echocardiogram EF 80% There is severe septal thickening consistent with hypertrophic cardiomyopathy.  Carotid Doppler Bilateral: No evidence of hemodynamically significant internal carotid artery stenosis. Vertebral artery flow is antegrade.  Transcranial Dopplers Normal mean flow  velocities. Globally elevated pulsatility indices suggest diffuse intracranial atherosclerosis -currently on ASA 325 (was on 81 at home) -TEE could not be completed due to excessive secretions in airway -Per Neuro, pt will need event monitor for 3 wks rather than re-attempting TEE- I have discussed it with Trish from [employer] and it will be set up as oupt (takes 3-4 days to get approved)  - cont SLP    Dysphagia -passed SLP eval and d1 with nectar thick ordered per recommendations - cont slow hydration for at least another 24 hrs until PO intake is adequate- BUN/Cr ratio still elevated and suggestive of dehydration - nutrition consult   Pneumonia - possible aspiration- covering with Unasyn for now- fevers resolved and O2 requirements have decreased from 3 to 1 L - follow for need to broaden antibiotics  Clogged NG tube - removed   Malignant HTN BP now better controlled   Hypertrophic cardiomyopathy  -cont B Blocker and maintain adequate preload -EF 80%  Hyperlipidemia   Hypokalemia Replaced  Code Status: FULL Family Communication:  Plan discussed with patient and daughter at bedside Disposition Plan: pt has bed at nursing facility when ready   Consultants: Neurology [place] [facility] for TEE  DVT prophylaxis: SQ Heparin  HPI/Subjective: The patient is awake - no complaints- pat able to verbalize need for BMs therefore, discussed removing foley (and also central line) with family- discussed need for increased PO fluid intake- PO intake is poor this morning.    Objective: Blood pressure 141/44, pulse 88, temperature 98.3 F (36.8 C), temperature source Oral, resp. rate 30, height 5\' 3"  (1.6 m), weight 71 kg (156 lb 8.4 oz), SpO2 94.00%.  Intake/Output Summary (Last 24 hours) at 01/17/13 1622 Last data filed at 01/17/13 1227  Gross per  24 hour  Intake    210 ml  Output   1300 ml  Net  -1090 ml    Exam: General: No acute respiratory distress Lungs: Clear  to auscultation bilaterally without wheezes or crackles- noted to have a congested cough  Cardiovascular: Regular rate and rhythm without murmur gallop or rub  Abdomen: Nontender, nondistended, soft, bowel sounds positive, no rebound, no ascites, no appreciable mass Extremities: No significant cyanosis, clubbing, or edema bilateral lower extremities Neuro: verbalizes with one word answers only, follows commands, right arm 0/5 - moves left side spontaneously  Data Reviewed: Basic Metabolic Panel:  Recent Labs Lab 01/11/13 0500 01/12/13 0340 01/13/13 0600 01/15/13 0530 01/17/13 0330  NA 145 143 141 143 145  K 3.7 3.1* 2.9* 4.6 3.9  CL 109 101 96 103 110  CO2 23 31 37* 31 24  GLUCOSE 103* 152* 196* 107* 106*  BUN 34* 35* 41* 35* 25*  CREATININE 1.23* 1.21* 1.26* 1.11* 1.02  CALCIUM 7.9* 7.7* 7.3* 8.0* 7.9*  MG  --  1.7  --  2.1  --    Liver Function Tests:  Recent Labs Lab 01/12/13 0340  AST 46*  ALT 81*  ALKPHOS 68  BILITOT 0.5  PROT 5.7*  ALBUMIN 1.7*    Recent Labs Lab 01/11/13 0500  LIPASE 52   CBC:  Recent Labs Lab 01/11/13 0500 01/12/13 0340 01/17/13 0330  WBC 9.9 8.8 10.6*  NEUTROABS 7.8* 7.1  --   HGB 8.2* 8.8* 7.8*  HCT 23.8* 25.1* 23.8*  MCV 88.8 88.7 91.9  PLT 159 181 435*   Cardiac Enzymes: No results found for this basename: CKTOTAL, CKMB, CKMBINDEX, TROPONINI,  in the last 168 hours CBG:  Recent Labs Lab 01/16/13 1655 01/16/13 1931 01/16/13 2358 01/17/13 0320 01/17/13 0831  GLUCAP 107* 101* 109* 104* 106*    Recent Results (from the past 240 hour(s))  MRSA PCR SCREENING     Status: None   Collection Time    01/07/13  9:25 PM      Result Value Range Status   MRSA by PCR NEGATIVE  NEGATIVE Final   Comment:            The GeneXpert MRSA Assay (FDA     approved for NASAL specimens     only), is one component of a     comprehensive MRSA colonization     surveillance program. It is not     intended to diagnose MRSA      infection nor to guide or     monitor treatment for     MRSA infections.  CULTURE, BLOOD (ROUTINE X 2)     Status: None   Collection Time    01/07/13 10:55 PM      Result Value Range Status   Specimen Description BLOOD RIGHT HAND   Final   Special Requests BOTTLES DRAWN AEROBIC ONLY 5CC   Final   Culture  Setup Time 01/08/2013 03:50   Final   Culture NO GROWTH 5 DAYS   Final   Report Status 01/14/2013 FINAL   Final  CULTURE, BLOOD (ROUTINE X 2)     Status: None   Collection Time    01/07/13 11:50 PM      Result Value Range Status   Specimen Description BLOOD LEFT ARM   Final   Special Requests BOTTLES DRAWN AEROBIC ONLY 5CC   Final   Culture  Setup Time 01/08/2013 03:49   Final   Culture NO GROWTH 5  DAYS   Final   Report Status 01/14/2013 FINAL   Final  CULTURE, BLOOD (ROUTINE X 2)     Status: None   Collection Time    01/15/13  4:30 PM      Result Value Range Status   Specimen Description BLOOD HAND RIGHT   Final   Special Requests BOTTLES DRAWN AEROBIC AND ANAEROBIC 10CC   Final   Culture  Setup Time 01/16/2013 00:44   Final   Culture     Final   Value:        BLOOD CULTURE RECEIVED NO GROWTH TO DATE CULTURE WILL BE HELD FOR 5 DAYS BEFORE ISSUING A FINAL NEGATIVE REPORT   Report Status PENDING   Incomplete  CULTURE, BLOOD (ROUTINE X 2)     Status: None   Collection Time    01/15/13  4:35 PM      Result Value Range Status   Specimen Description BLOOD CENTRAL LINE   Final   Special Requests     Final   Value: BOTTLES DRAWN AEROBIC AND ANAEROBIC 10CC AER 5CC ANA   Culture  Setup Time 01/16/2013 00:44   Final   Culture     Final   Value:        BLOOD CULTURE RECEIVED NO GROWTH TO DATE CULTURE WILL BE HELD FOR 5 DAYS BEFORE ISSUING A FINAL NEGATIVE REPORT   Report Status PENDING   Incomplete     Studies:  Recent x-ray studies have been reviewed in detail by the Attending Physician  Scheduled Meds:  Scheduled Meds: . ampicillin-sulbactam (UNASYN) IV  3 g Intravenous Q8H   . antiseptic oral rinse  15 mL Mouth Rinse QID  . aspirin  325 mg Per Tube Daily  . chlorhexidine  15 mL Mouth Rinse BID  . feeding supplement  1 Container Oral TID BM  . heparin subcutaneous  5,000 Units Subcutaneous Q8H  . lactulose  10 g Per Tube BID  . levothyroxine  88 mcg Per Tube QAC breakfast  . pantoprazole  40 mg Oral Daily   Continuous Infusions: . sodium chloride 50 mL/hr (01/17/13 1222)    Time spent on care of this patient: 76 mins   Debbe Odea, [place]  [facility] Office  [phone] Pager - Text Page per Amion as per below:  On-Call/Text Page:      [email or web address]      password [email or web address]  If 7PM-7AM, please contact night-coverage [email or web address] Password [email or web address] 01/17/2013, 4:22 PM   LOS: 10 days

## 2013-01-17 NOTE — Progress Notes (Signed)
Speech Language Pathology Treatment Patient Details Name: [name] [name] MRN: [id number] DOB: September 10, 1932 Today's Date: 01/17/2013 Time: [id number] SLP Time Calculation (min): 25 min  Assessment / Plan / Recommendation Clinical Impression  Treatment session focused on facilitation of expressive and receptive language abilities as well as compensatory strategies for dysphagia. Pt with congested cough at baseline, productive. With mod assist for cup sips pt with appearance of functional swallow. Responded to slp cues for throat clear ith 100% accuracy. Pt refused trials of solids textures.    Pt much more reponsive to cues today, sustained attention improving. Pt verbalized in automatic tasks with 80% accuracy, repeated 4/4 sentences with min cues. Read single words and phrases with max verbal and visual cues. Pts receptive language much improved, able to respond with 80% accuracy to y/n questions (basic enviormental and biographical). With min verbal cues pt able to name 4/4 family members and name three basic objects.     SLP Plan  Continue with current plan of care    Pertinent Vitals/Pain NA  SLP Goals  SLP Goals SLP Goal #1: Pt will verbalize in automatic speech tasks with max verbal cues in 80% of opportunites.  SLP Goal #1 - Progress: Progressing toward goal SLP Goal #2: Pt will sustain attention to basic functional tasks with moderate verbal cues for 1 minute. SLP Goal #2 - Progress: Progressing toward goal SLP Goal #3: Pt will follow one stap commands during functional task with mod verbal cues.  SLP Goal #3 - Progress: Progressing toward goal  General Temperature Spikes Noted: No Respiratory Status: Supplemental O2 delivered via (comment) Behavior/Cognition: Alert;Requires cueing Oral Cavity - Dentition: Adequate natural dentition Patient Positioning: Upright in bed  Oral Cavity - Oral Hygiene     Treatment Treatment focused on: Aphasia (dysphagia)   GO    [name] Baltimore,  MA CCC-SLP [phone]  [name] [name] 01/17/2013, 3:16 PM

## 2013-01-17 NOTE — Progress Notes (Signed)
Clinical Social Worker submitted clinical information to [employer] and [employer].  CSW to continue to follow and assist as needed.   Dala Dock, MSW, [place]

## 2013-01-18 ENCOUNTER — Inpatient Hospital Stay (HOSPITAL_COMMUNITY): Payer: Medicare Other

## 2013-01-18 MED ORDER — PANTOPRAZOLE SODIUM 40 MG IV SOLR
40.0000 mg | Freq: Every day | INTRAVENOUS | Status: DC
  Administered 2013-01-18: 40 mg via INTRAVENOUS
  Filled 2013-01-18: qty 40

## 2013-01-18 MED ORDER — LABETALOL HCL 100 MG PO TABS
100.0000 mg | ORAL_TABLET | Freq: Two times a day (BID) | ORAL | Status: DC
  Filled 2013-01-18: qty 1

## 2013-01-18 MED ORDER — LABETALOL HCL 100 MG PO TABS
100.0000 mg | ORAL_TABLET | Freq: Two times a day (BID) | ORAL | Status: DC
  Administered 2013-01-18: 100 mg via ORAL
  Filled 2013-01-18 (×3): qty 1

## 2013-01-18 MED ORDER — ENSURE COMPLETE PO LIQD
237.0000 mL | Freq: Two times a day (BID) | ORAL | Status: DC
  Administered 2013-01-19 – 2013-01-22 (×4): 237 mL via ORAL

## 2013-01-18 MED ORDER — LACTULOSE 10 GM/15ML PO SOLN
10.0000 g | Freq: Two times a day (BID) | ORAL | Status: DC
  Administered 2013-01-18 – 2013-01-20 (×4): 10 g via ORAL
  Filled 2013-01-18 (×5): qty 15

## 2013-01-18 MED ORDER — ASPIRIN 325 MG PO TABS
325.0000 mg | ORAL_TABLET | Freq: Every day | ORAL | Status: DC
  Administered 2013-01-19 – 2013-01-23 (×5): 325 mg via ORAL
  Filled 2013-01-18 (×5): qty 1

## 2013-01-18 MED ORDER — LEVOTHYROXINE SODIUM 88 MCG PO TABS
88.0000 ug | ORAL_TABLET | Freq: Every day | ORAL | Status: DC
  Administered 2013-01-19 – 2013-01-23 (×5): 88 ug via ORAL
  Filled 2013-01-18 (×6): qty 1

## 2013-01-18 NOTE — Progress Notes (Signed)
[facility] Progress Note [place] TEAM 1 - Stepdown/ICU TEAM   [name] [name] Vida [id number] DOB: 22-Jun-1933 DOA: 01/07/2013 PCP: Walker Kehr, MD  Brief narrative: 79 y/o who was admitted to [facility] 5/4 with acute pancreatitis later diagnosed to be gallstone pancreatitis. She developed oliguric renal failure and malignant HTN at [facility], and was transferred to [facility] on 5/5.  She became confused on 5/5 and on 5/6 was noted to have slurred speech and right sided facial weakness. Code Stroke was called but pt was not a candidate for TPA due to inability to pinpoint when she was last seen without these symptoms. MRI revealed embolic infarcts.   5/4 Admitted to [place] with acute pancreatitis  5/4 Abdominal US >>> Gallbladder stones, CBD 4 mm  5/4 Abdomen CT >>> Diffuse acute pancreatitis, CBD mildly dilated, no mass, dilated gallbladder with gallstones, no hydronephrosis  5/5 Transferred to [facility]  5/6 slurred speech and R facial weakness noted >> Code Stroke 5/6 Head CT>>>Scattered gray and white matter edema. Posterior circulation predominant but also left MCA involvement suspected. No associated mass effect or hemorrhage. Top differential considerations include subacute embolic infarcts and posterior reversible encephalopathy syndrome.  5/7 patient intubated due to increasing WOB  5/8 extubated  Assessment/Plan:  Acute gallstone pancreatitis - felt to be related to passed gallstone - will need eventual outpt surgical eval for cholecystectomy - pain resolved  Acute renal failure -Resolved but pt still dehydrated based upon BUN/cr ratio -baseline Cr about 1.5 -recheck in AM - cont IVF for now   Bilateral supratentorial and cerebellar acute / subacute infarcts with dysarthria -limited MRI but probable embolic infarcts noted   2D Echocardiogram EF 80% There is severe septal thickening consistent with hypertrophic cardiomyopathy.    Carotid Doppler Bilateral: No evidence  of hemodynamically significant internal carotid artery stenosis. Vertebral artery flow is antegrade.    Transcranial Dopplers Normal mean flow velocities. Globally elevated pulsatility indices suggest diffuse intracranial atherosclerosis -currently on ASA 325 (was on 81 at home) -TEE could not be completed due to excessive secretions in airway -Per Neuro, pt will need event monitor for 3 wks rather than re-attempting TEE - Dr. Wynelle Cleveland discussed this with Trish from [facility] and it will be set up as oupt (takes 3-4 days to get approved)  - cont SLP   Dysphagia -passed SLP eval and D1 with nectar thick ordered per recommendations - cont slow hydration for at least another 24 hrs until PO intake is adequate - BUN/Cr ratio still elevated and suggestive of dehydration - nutrition consult   Pneumonia - possible aspiration pna vs/ pneumonitis vs/ atx - covering with Unasyn for now - fevers resolved and O2 requirements have decreased from 3 to 1 L - follow for need to broaden antibiotics - attempt to wean to RA   Clogged NG tube - removed   Malignant HTN - BP now better controlled, but having occasional spikes - adjust tx and follow   Hypertrophic cardiomyopathy  -cont B Blocker and maintain adequate preload -EF 80%  Hyperlipidemia  -LDL 77, not on statin PTA, at goal LDL < 100  Hypokalemia -replaced/corrected  Normocytic Anemia Drop in Hgb likely due to hydration - recheck in AM  Code Status: FULL Family Communication:  Plan discussed with patient and daughters at bedside Disposition Plan: plan for d/c to [facility] in [place] on Monday    Consultants: Neurology [place] [facility]   DVT prophylaxis: SQ Heparin  HPI/Subjective: Pt is alert and conversant.  Speech  is signif improved since my last visit.  She denies cp, n/v, sob, or abdom pain.    Objective: Blood pressure 136/70, pulse 70, temperature 98.5 F (36.9 C), temperature source Oral, resp. rate 17, height 5\' 3"   (1.6 m), weight 71 kg (156 lb 8.4 oz), SpO2 94.00%.  Intake/Output Summary (Last 24 hours) at 01/18/13 1349 Last data filed at 01/18/13 0900  Gross per 24 hour  Intake    120 ml  Output    150 ml  Net    -30 ml    Exam: General: No acute respiratory distress Lungs: Clear to auscultation bilaterally without wheezes or crackles Cardiovascular: Regular rate and rhythm without murmur gallop or rub  Abdomen: Nontender, nondistended, soft, bowel sounds positive, no rebound, no ascites, no appreciable mass Extremities: No significant cyanosis, clubbing, or edema bilateral lower extremities   Data Reviewed: Basic Metabolic Panel:  Recent Labs Lab 01/12/13 0340 01/13/13 0600 01/15/13 0530 01/17/13 0330  NA 143 141 143 145  K 3.1* 2.9* 4.6 3.9  CL 101 96 103 110  CO2 31 37* 31 24  GLUCOSE 152* 196* 107* 106*  BUN 35* 41* 35* 25*  CREATININE 1.21* 1.26* 1.11* 1.02  CALCIUM 7.7* 7.3* 8.0* 7.9*  MG 1.7  --  2.1  --    Liver Function Tests:  Recent Labs Lab 01/12/13 0340  AST 46*  ALT 81*  ALKPHOS 68  BILITOT 0.5  PROT 5.7*  ALBUMIN 1.7*   CBC:  Recent Labs Lab 01/12/13 0340 01/17/13 0330  WBC 8.8 10.6*  NEUTROABS 7.1  --   HGB 8.8* 7.8*  HCT 25.1* 23.8*  MCV 88.7 91.9  PLT 181 435*   CBG:  Recent Labs Lab 01/16/13 1655 01/16/13 1931 01/16/13 2358 01/17/13 0320 01/17/13 0831  GLUCAP 107* 101* 109* 104* 106*    Recent Results (from the past 240 hour(s))  CULTURE, BLOOD (ROUTINE X 2)     Status: None   Collection Time    01/15/13  4:30 PM      Result Value Range Status   Specimen Description BLOOD HAND RIGHT   Final   Special Requests BOTTLES DRAWN AEROBIC AND ANAEROBIC 10CC   Final   Culture  Setup Time 01/16/2013 00:44   Final   Culture     Final   Value:        BLOOD CULTURE RECEIVED NO GROWTH TO DATE CULTURE WILL BE HELD FOR 5 DAYS BEFORE ISSUING A FINAL NEGATIVE REPORT   Report Status PENDING   Incomplete  CULTURE, BLOOD (ROUTINE X 2)      Status: None   Collection Time    01/15/13  4:35 PM      Result Value Range Status   Specimen Description BLOOD CENTRAL LINE   Final   Special Requests     Final   Value: BOTTLES DRAWN AEROBIC AND ANAEROBIC 10CC AER 5CC ANA   Culture  Setup Time 01/16/2013 00:44   Final   Culture     Final   Value:        BLOOD CULTURE RECEIVED NO GROWTH TO DATE CULTURE WILL BE HELD FOR 5 DAYS BEFORE ISSUING A FINAL NEGATIVE REPORT   Report Status PENDING   Incomplete     Studies:  Recent x-ray studies have been reviewed in detail by the Attending Physician  Scheduled Meds:  Scheduled Meds: . ampicillin-sulbactam (UNASYN) IV  3 g Intravenous Q8H  . antiseptic oral rinse  15 mL Mouth Rinse QID  .  aspirin  325 mg Per Tube Daily  . chlorhexidine  15 mL Mouth Rinse BID  . feeding supplement  237 mL Oral BID WC  . feeding supplement  1 Container Oral TID BM  . heparin subcutaneous  5,000 Units Subcutaneous Q8H  . lactulose  10 g Per Tube BID  . levothyroxine  88 mcg Per Tube QAC breakfast  . pantoprazole (PROTONIX) IV  40 mg Intravenous Daily   Continuous Infusions: . sodium chloride 50 mL/hr at 01/17/13 1600    Time spent on care of this patient: 35 mins   Leata Dominy T, MD  [facility] Office  [phone] Pager - Text Page per Shea Evans as per below:  On-Call/Text Page:      [email or web address]      password [email or web address]  If 7PM-7AM, please contact night-coverage [email or web address] Password [email or web address] 01/18/2013, 1:49 PM   LOS: 11 days

## 2013-01-18 NOTE — Clinical Social Work Note (Signed)
Patient was not medically stable for discharge to [facility] in [place] today. Facility admissions staff person Ivin Booty was contacted and advised. CSW advised by MD that patient should be ready for d/c on Monday, 5/19. CSW will continue to follow and facilitate discharge to SNF when medically stable.  Keiron Iodice Givens, MSW, LCSW [phone]

## 2013-01-18 NOTE — Progress Notes (Signed)
OT Cancellation Note  Patient Details Name: [name] [name] MRN: [id number] DOB: November 16, 1932   Cancelled Treatment:    Reason Eval/Treat Not Completed: Patient's level of consciousness--per family in room, pt had just fallen asleep and needed to rest.  Almon Register [id number] 01/18/2013, 4:05 PM

## 2013-01-18 NOTE — Progress Notes (Signed)
NUTRITION FOLLOW UP/CONSULT  Intervention:    Magic cups TID with all meals. Ensure Pudding PO TID, each supplement provides 170 kcal and 4 grams of protein.  Add Ensure Complete po BID, each supplement provides 350 kcal and 13 grams of protein. RD to add yogurt to meal trays RD to continue to follow nutrition care plan  Nutrition Dx:   Inadequate oral intake related to dysphagia as evidenced by poor intake of meals. Ongoing.  Goal:   Intake to meet >90% of estimated nutrition needs. Unmet.  Monitor:   PO intake, weight trend, labs, supplement tolerance/acceptance  Assessment:   Admitted to [facility] with acute pancreatitis on 5/4. Transferred to [facility] 5/5 and Code Stroke called 5/6.  S/P MBS with SLP 5/13. Diet advanced to Dysphagia 1 with nectar thick liquids. PO intake has been poor; consuming 10 - 15% of meals. Likes the [employer] and receiving those on trays. Pt with orders for Ensure Pudding.  RD consulted for poor po intake. Pt currently eating very little of meals, discussed with daughter about oral intake. She reports that pt's oral intake is poor 2/2 texture preferences - pt does not like pureed foods. Pt is drinking well per daughter and she is in agreement of trying Ensure Complete to help with calorie/protein intake.   Height: Ht Readings from Last 1 Encounters:  01/08/13 5\' 3"  (1.6 m)    Weight Status:   Wt Readings from Last 1 Encounters:  01/15/13 156 lb 8.4 oz (71 kg)  01/14/13  165 lb 2 oz (74.9 kg)  01/09/13  164 lb 3.9 oz (74.5 kg)  12/21/12  156 lb (70.761 kg)   Weight stable since admission.  Re-estimated needs:  Kcal: 1700-1900 Protein: 110-120 gm Fluid: 1.7-2 L  Skin: no problems noted  Diet Order: Dysphagia 1 with nectar thick liquids.    Intake/Output Summary (Last 24 hours) at 01/18/13 0912 Last data filed at 01/18/13 0211  Gross per 24 hour  Intake     10 ml  Output    350 ml  Net   -340 ml    Last BM:  5/15   Labs:   Recent Labs Lab 01/12/13 0340 01/13/13 0600 01/15/13 0530 01/17/13 0330  NA 143 141 143 145  K 3.1* 2.9* 4.6 3.9  CL 101 96 103 110  CO2 31 37* 31 24  BUN 35* 41* 35* 25*  CREATININE 1.21* 1.26* 1.11* 1.02  CALCIUM 7.7* 7.3* 8.0* 7.9*  MG 1.7  --  2.1  --   GLUCOSE 152* 196* 107* 106*    CBG (last 3)   Recent Labs  01/16/13 2358 01/17/13 0320 01/17/13 0831  GLUCAP 109* 104* 106*    Scheduled Meds: . ampicillin-sulbactam (UNASYN) IV  3 g Intravenous Q8H  . antiseptic oral rinse  15 mL Mouth Rinse QID  . aspirin  325 mg Per Tube Daily  . chlorhexidine  15 mL Mouth Rinse BID  . feeding supplement  1 Container Oral TID BM  . heparin subcutaneous  5,000 Units Subcutaneous Q8H  . lactulose  10 g Per Tube BID  . levothyroxine  88 mcg Per Tube QAC breakfast  . pantoprazole  40 mg Oral Daily    Continuous Infusions: . sodium chloride 50 mL/hr at 01/17/13 [address] [place], [place], [place] Pager: [phone] After-hours pager: [phone]

## 2013-01-18 NOTE — Progress Notes (Signed)
ANTIBIOTIC CONSULT NOTE - FOLLOW UP  Pharmacy Consult for Unasyn Indication: aspiration PNA  Allergies  Allergen Reactions  . Amlodipine Besylate     REACTION: edema  . Cefuroxime Axetil     REACTION: nausea  . Nitrofurantoin     REACTION: nausea  . Sulfadiazine     Patient Measurements: Height: 5\' 3"  (160 cm) Weight: 156 lb 8.4 oz (71 kg) IBW/kg (Calculated) : 52.4  Vital Signs: Temp: 98.5 F (36.9 C) (05/16 0931) Temp src: Oral (05/16 0931) BP: 136/70 mmHg (05/16 0931) Pulse Rate: 70 (05/16 0931) Intake/Output from previous day: 05/15 0701 - 05/16 0700 In: 10 [P.O.:10] Out: 850 [Urine:850] Intake/Output from this shift: Total I/O In: 120 [P.O.:120] Out: -   Labs:  Recent Labs  01/17/13 0330  WBC 10.6*  HGB 7.8*  PLT 435*  CREATININE 1.02   Estimated Creatinine Clearance: 42.9 ml/min (by C-G formula based on Cr of 1.02). No results found for this basename: VANCOTROUGH, Corlis Leak, VANCORANDOM, [place], GENTPEAK, GENTRANDOM, TOBRATROUGH, TOBRAPEAK, TOBRARND, AMIKACINPEAK, AMIKACINTROU, AMIKACIN,  in the last 72 hours   Microbiology: Recent Results (from the past 720 hour(s))  MRSA PCR SCREENING     Status: None   Collection Time    01/07/13  9:25 PM      Result Value Range Status   MRSA by PCR NEGATIVE  NEGATIVE Final   Comment:            The GeneXpert MRSA Assay (FDA     approved for NASAL specimens     only), is one component of a     comprehensive MRSA colonization     surveillance program. It is not     intended to diagnose MRSA     infection nor to guide or     monitor treatment for     MRSA infections.  CULTURE, BLOOD (ROUTINE X 2)     Status: None   Collection Time    01/07/13 10:55 PM      Result Value Range Status   Specimen Description BLOOD RIGHT HAND   Final   Special Requests BOTTLES DRAWN AEROBIC ONLY 5CC   Final   Culture  Setup Time 01/08/2013 03:50   Final   Culture NO GROWTH 5 DAYS   Final   Report Status 01/14/2013 FINAL    Final  CULTURE, BLOOD (ROUTINE X 2)     Status: None   Collection Time    01/07/13 11:50 PM      Result Value Range Status   Specimen Description BLOOD LEFT ARM   Final   Special Requests BOTTLES DRAWN AEROBIC ONLY 5CC   Final   Culture  Setup Time 01/08/2013 03:49   Final   Culture NO GROWTH 5 DAYS   Final   Report Status 01/14/2013 FINAL   Final  CULTURE, BLOOD (ROUTINE X 2)     Status: None   Collection Time    01/15/13  4:30 PM      Result Value Range Status   Specimen Description BLOOD HAND RIGHT   Final   Special Requests BOTTLES DRAWN AEROBIC AND ANAEROBIC 10CC   Final   Culture  Setup Time 01/16/2013 00:44   Final   Culture     Final   Value:        BLOOD CULTURE RECEIVED NO GROWTH TO DATE CULTURE WILL BE HELD FOR 5 DAYS BEFORE ISSUING A FINAL NEGATIVE REPORT   Report Status PENDING   Incomplete  CULTURE, BLOOD (ROUTINE X  2)     Status: None   Collection Time    01/15/13  4:35 PM      Result Value Range Status   Specimen Description BLOOD CENTRAL LINE   Final   Special Requests     Final   Value: BOTTLES DRAWN AEROBIC AND ANAEROBIC 10CC AER 5CC ANA   Culture  Setup Time 01/16/2013 00:44   Final   Culture     Final   Value:        BLOOD CULTURE RECEIVED NO GROWTH TO DATE CULTURE WILL BE HELD FOR 5 DAYS BEFORE ISSUING A FINAL NEGATIVE REPORT   Report Status PENDING   Incomplete    Anti-infectives   Start     Dose/Rate Route Frequency Ordered Stop   01/15/13 2000  Ampicillin-Sulbactam (UNASYN) 3 g in sodium chloride 0.9 % 100 mL IVPB     3 g 100 mL/hr over 60 Minutes Intravenous Every 8 hours 01/15/13 1933        Assessment: 79 year old female on Day #4 of Unasyn for aspiration PNA.  Her renal function is stable, and she is afebrile.    Plan:  Continue Unasyn 3gm IV q8h Monitor renal function, micro data, and clinical condition  Legrand Como, Pharm.D., BCPS Clinical Pharmacist Phone: [phone] or [phone] Pager: [phone] 01/18/2013, 1:55 PM

## 2013-01-19 MED ORDER — LABETALOL HCL 200 MG PO TABS
200.0000 mg | ORAL_TABLET | Freq: Two times a day (BID) | ORAL | Status: DC
  Administered 2013-01-19 – 2013-01-23 (×9): 200 mg via ORAL
  Filled 2013-01-19 (×10): qty 1

## 2013-01-19 MED ORDER — PRO-STAT SUGAR FREE PO LIQD
30.0000 mL | Freq: Three times a day (TID) | ORAL | Status: DC
  Administered 2013-01-19 – 2013-01-23 (×6): 30 mL via ORAL
  Filled 2013-01-19 (×13): qty 30

## 2013-01-19 NOTE — Progress Notes (Signed)
Occupational Therapy Note  OT order received and appreciated.  Per LCSW note, pt has chosen to d/c to [facility] and should be ready for d/c to SNF on Monday 5/19. Will defer OT eval to next venue of care. Thanks.  01/19/2013 Darrol Jump OTR/L Pager [phone] Office [phone]

## 2013-01-19 NOTE — Progress Notes (Signed)
[facility] Progress Note [place] TEAM 1 - Stepdown/ICU TEAM   [name] [name] [id number] DOB: 06/14/1933 DOA: 01/07/2013 PCP: Walker Kehr, MD  Brief narrative: 79 y/o who was admitted to [facility] 5/4 with acute pancreatitis later diagnosed to be gallstone pancreatitis. She developed oliguric renal failure and malignant HTN at [facility], and was transferred to [facility] on 5/5.  She became confused on 5/5 and on 5/6 was noted to have slurred speech and right sided facial weakness. Code Stroke was called but pt was not a candidate for TPA due to inability to pinpoint when she was last seen without these symptoms. MRI revealed embolic infarcts.   5/4 Admitted to [facility] with acute pancreatitis  5/4 Abdominal US >>> Gallbladder stones, CBD 4 mm  5/4 Abdomen CT >>> Diffuse acute pancreatitis, CBD mildly dilated, no mass, dilated gallbladder with gallstones, no hydronephrosis  5/5 Transferred to [facility]  5/6 slurred speech and R facial weakness noted >> Code Stroke 5/6 Head CT>>>Scattered gray and white matter edema. Posterior circulation predominant but also left MCA involvement suspected. No associated mass effect or hemorrhage. Top differential considerations include subacute embolic infarcts and posterior reversible encephalopathy syndrome.  5/7 patient intubated due to increasing WOB  5/8 extubated  Assessment/Plan:  Acute gallstone pancreatitis - felt to be related to passed gallstone - will need eventual outpt surgical eval for cholecystectomy - pain resolved  Acute renal failure -Resolved but pt still dehydrated based upon BUN/cr ratio -baseline Cr about 1.5 -recheck in AM (ordered for today but not done for unclear reasons) - cont IVF for now   Bilateral supratentorial and cerebellar acute / subacute infarcts with dysarthria -limited MRI but probable embolic infarcts noted   2D Echocardiogram EF 80% There is severe septal thickening consistent with hypertrophic  cardiomyopathy.    Carotid Doppler Bilateral: No evidence of hemodynamically significant internal carotid artery stenosis. Vertebral artery flow is antegrade.    Transcranial Dopplers Normal mean flow velocities. Globally elevated pulsatility indices suggest diffuse intracranial atherosclerosis -currently on ASA 325 (was on 81 at home) -TEE could not be completed due to excessive secretions in airway -Per Neuro, pt will need event monitor for 3 wks rather than re-attempting TEE - Dr. Wynelle Cleveland discussed this with Trish from [facility] and it will be set up as oupt (takes 3-4 days to get approved)  - cont SLP   Dysphagia -passed SLP eval and D1 with nectar thick ordered per recommendations - cont slow hydration until PO intake is adequate - BUN/Cr ratio still elevated and suggestive of dehydration - nutrition consult  -family hoping to be able to advance liquids - will ask SLP to comment - speech has markedly improved  Pneumonia - possible aspiration pna vs/ pneumonitis vs/ atx - will complete course of Unasyn today - fevers resolved and O2 requirements have resolved (on RA today)  Malignant HTN - BP now better controlled, but having occasional spikes - adjust tx further and follow   Hypertrophic cardiomyopathy  -cont B Blocker and maintain adequate preload -EF 80%  Hyperlipidemia  -LDL 77, not on statin PTA, at goal LDL < 100  Hypokalemia -replaced/corrected - recheck in AM  Normocytic Anemia Drop in Hgb likely due to hydration - recheck ordered for today but not done for unclear reasons - will recheck in AM  Code Status: FULL Family Communication:  Plan discussed with patient and daughter at bedside Disposition Plan: plan for d/c to [facility] in [place] on Monday    Consultants: Neurology  [facility]   DVT prophylaxis: SQ Heparin  HPI/Subjective: Pt is alert and conversant.  No new complaints.  She denies cp, n/v, sob, or abdom pain.    Objective: Blood pressure  150/46, pulse 75, temperature 98.1 F (36.7 C), temperature source Oral, resp. rate 18, height 5\' 3"  (1.6 m), weight 71 kg (156 lb 8.4 oz), SpO2 94.00%.  Intake/Output Summary (Last 24 hours) at 01/19/13 1115 Last data filed at 01/19/13 0900  Gross per 24 hour  Intake    480 ml  Output      0 ml  Net    480 ml    Exam: General: No acute respiratory distress Lungs: Clear to auscultation bilaterally without wheezes or crackles Cardiovascular: Regular rate and rhythm without murmur gallop or rub  Abdomen: Nontender, nondistended, soft, bowel sounds positive, no rebound, no ascites, no appreciable mass Extremities: No significant cyanosis, clubbing, or edema bilateral lower extremities   Data Reviewed: Basic Metabolic Panel:  Recent Labs Lab 01/13/13 0600 01/15/13 0530 01/17/13 0330  NA 141 143 145  K 2.9* 4.6 3.9  CL 96 103 110  CO2 37* 31 24  GLUCOSE 196* 107* 106*  BUN 41* 35* 25*  CREATININE 1.26* 1.11* 1.02  CALCIUM 7.3* 8.0* 7.9*  MG  --  2.1  --    Liver Function Tests: No results found for this basename: AST, ALT, ALKPHOS, BILITOT, PROT, ALBUMIN,  in the last 168 hours CBC:  Recent Labs Lab 01/17/13 0330  WBC 10.6*  HGB 7.8*  HCT 23.8*  MCV 91.9  PLT 435*   CBG:  Recent Labs Lab 01/16/13 1655 01/16/13 1931 01/16/13 2358 01/17/13 0320 01/17/13 0831  GLUCAP 107* 101* 109* 104* 106*    Recent Results (from the past 240 hour(s))  CULTURE, BLOOD (ROUTINE X 2)     Status: None   Collection Time    01/15/13  4:30 PM      Result Value Range Status   Specimen Description BLOOD HAND RIGHT   Final   Special Requests BOTTLES DRAWN AEROBIC AND ANAEROBIC 10CC   Final   Culture  Setup Time 01/16/2013 00:44   Final   Culture     Final   Value:        BLOOD CULTURE RECEIVED NO GROWTH TO DATE CULTURE WILL BE HELD FOR 5 DAYS BEFORE ISSUING A FINAL NEGATIVE REPORT   Report Status PENDING   Incomplete  CULTURE, BLOOD (ROUTINE X 2)     Status: None   Collection  Time    01/15/13  4:35 PM      Result Value Range Status   Specimen Description BLOOD CENTRAL LINE   Final   Special Requests     Final   Value: BOTTLES DRAWN AEROBIC AND ANAEROBIC 10CC AER 5CC ANA   Culture  Setup Time 01/16/2013 00:44   Final   Culture     Final   Value:        BLOOD CULTURE RECEIVED NO GROWTH TO DATE CULTURE WILL BE HELD FOR 5 DAYS BEFORE ISSUING A FINAL NEGATIVE REPORT   Report Status PENDING   Incomplete     Studies:  Recent x-ray studies have been reviewed in detail by the Attending Physician  Scheduled Meds:  Scheduled Meds: . ampicillin-sulbactam (UNASYN) IV  3 g Intravenous Q8H  . antiseptic oral rinse  15 mL Mouth Rinse QID  . aspirin  325 mg Oral Daily  . chlorhexidine  15 mL Mouth Rinse BID  .  feeding supplement  237 mL Oral BID WC  . feeding supplement  1 Container Oral TID BM  . heparin subcutaneous  5,000 Units Subcutaneous Q8H  . labetalol  100 mg Oral BID  . lactulose  10 g Oral BID  . levothyroxine  88 mcg Oral QAC breakfast   Continuous Infusions: . sodium chloride 50 mL/hr (01/18/13 1414)    Time spent on care of this patient: 25 mins   MCCLUNG,JEFFREY T, MD  [facility] Office  [phone] Pager - Text Page per Shea Evans as per below:  On-Call/Text Page:      [email or web address]      password [email or web address]  If 7PM-7AM, please contact night-coverage [email or web address] Password [email or web address] 01/19/2013, 11:15 AM   LOS: 12 days

## 2013-01-20 LAB — COMPREHENSIVE METABOLIC PANEL
Albumin: 1.5 g/dL — ABNORMAL LOW (ref 3.5–5.2)
Alkaline Phosphatase: 63 U/L (ref 39–117)
BUN: 14 mg/dL (ref 6–23)
Creatinine, Ser: 0.99 mg/dL (ref 0.50–1.10)
Potassium: 3.5 mEq/L (ref 3.5–5.1)
Total Protein: 5.4 g/dL — ABNORMAL LOW (ref 6.0–8.3)

## 2013-01-20 LAB — CBC
HCT: 21.2 % — ABNORMAL LOW (ref 36.0–46.0)
MCHC: 34 g/dL (ref 30.0–36.0)
Platelets: 512 10*3/uL — ABNORMAL HIGH (ref 150–400)
RDW: 14.8 % (ref 11.5–15.5)

## 2013-01-20 LAB — ABO/RH: ABO/RH(D): AB NEG

## 2013-01-20 MED ORDER — LORAZEPAM 0.5 MG PO TABS
0.5000 mg | ORAL_TABLET | Freq: Once | ORAL | Status: AC
  Administered 2013-01-20: 0.5 mg via ORAL
  Filled 2013-01-20: qty 1

## 2013-01-20 MED ORDER — OXYCODONE HCL 5 MG PO TABS
5.0000 mg | ORAL_TABLET | ORAL | Status: DC | PRN
  Administered 2013-01-21: 5 mg via ORAL
  Filled 2013-01-20: qty 1

## 2013-01-20 NOTE — Progress Notes (Signed)
[facility] Progress Note  TEAM 1 - Stepdown/ICU TEAM   [name] [name] [id number] DOB: 02/28/1933 DOA: 01/07/2013 PCP: Walker Kehr, MD  Brief narrative: 79 y/o who was admitted to [facility] 5/4 with acute pancreatitis later diagnosed to be gallstone pancreatitis. She developed oliguric renal failure and malignant HTN at [facility], and was transferred to [facility] on 5/5.  She became confused on 5/5 and on 5/6 was noted to have slurred speech and right sided facial weakness. Code Stroke was called but pt was not a candidate for TPA due to inability to pinpoint when she was last seen without these symptoms. MRI revealed embolic infarcts.   5/4 Admitted to [place] with acute pancreatitis  5/4 Abdominal US >>> Gallbladder stones, CBD 4 mm  5/4 Abdomen CT >>> Diffuse acute pancreatitis, CBD mildly dilated, no mass, dilated gallbladder with gallstones, no hydronephrosis  5/5 Transferred to [facility]  5/6 slurred speech and R facial weakness noted >> Code Stroke 5/6 Head CT>>>Scattered gray and white matter edema. Posterior circulation predominant but also left MCA involvement suspected. No associated mass effect or hemorrhage. Top differential considerations include subacute embolic infarcts and posterior reversible encephalopathy syndrome.  5/7 patient intubated due to increasing WOB  5/8 extubated  Assessment/Plan:  Acute gallstone pancreatitis - felt to be related to passed gallstone - will need eventual outpt surgical eval for cholecystectomy - discussed need to delay "elective" surgery in post CVA period with family - pain resolved  Acute renal failure -Resolved  -baseline Cr reportedly ~1.5  Bilateral supratentorial and cerebellar acute / subacute infarcts with dysarthria -limited MRI but probable embolic infarcts noted   2D Echocardiogram EF 80% There is severe septal thickening consistent with hypertrophic cardiomyopathy.    Carotid Doppler Bilateral: No evidence  of hemodynamically significant internal carotid artery stenosis. Vertebral artery flow is antegrade.    Transcranial Dopplers Normal mean flow velocities. Globally elevated pulsatility indices suggest diffuse intracranial atherosclerosis -currently on ASA 325 (was on 81 at home) -TEE could not be completed due to excessive secretions in airway -Per Neuro, pt will need event monitor for 3 wks rather than re-attempting TEE - Dr. Wynelle Cleveland discussed this with Trish from [facility] and it will be set up as oupt (takes 3-4 days to get approved)  - cont SLP   Dysphagia -passed SLP eval and D1 with nectar thick ordered per recommendations - a followup SLP eval has cleared patient for dysphagia 2 with nectar thick liquids -nutrition consult   Pneumonia - possible aspiration pna vs/ pneumonitis vs/ atx - completed course of Unasyn - fevers resolved and O2 requirements have resolved (on RA)  Malignant HTN - BP now better controlled  Hypertrophic cardiomyopathy  -cont B Blocker and maintain adequate preload -EF 80%  Hyperlipidemia  -LDL 77, not on statin PTA, at goal LDL < 100  Hypokalemia -replaced/corrected - recheck in AM  Normocytic Anemia Drop in Hgb likely due to hydration as well as poor intake in setting of acute disease - transfuse 1 unit packed red blood cells today  Code Status: FULL Family Communication:  Plan discussed with patient and daughter at bedside Disposition Plan: plan for d/c to [facility] in [place] on Monday    Consultants: Neurology [facility]   DVT prophylaxis: SQ Heparin  HPI/Subjective: Pt is alert and conversant.  No new complaints.  She denies cp, n/v, sob, or abdom pain.  She is anxious to begin her intense rehabilitation.  She complains of a very poor appetite.  Objective: Blood pressure 132/50, pulse 72, temperature 98.6 F (37 C), temperature source Oral, resp. rate 18, height 5\' 3"  (1.6 m), weight 69.4 kg (153 lb), SpO2  96.00%.  Intake/Output Summary (Last 24 hours) at 01/20/13 1711 Last data filed at 01/20/13 0900  Gross per 24 hour  Intake 4536.25 ml  Output      0 ml  Net 4536.25 ml    Exam: General: No acute respiratory distress - some airway congestion  Lungs: Very faint expiratory wheeze with good air movement throughout all fields  Cardiovascular: Regular rate and rhythm without murmur gallop or rub  Abdomen: Nontender, nondistended, soft, bowel sounds positive, no rebound, no ascites, no appreciable mass Extremities: No significant cyanosis, clubbing, or edema bilateral lower extremities   Data Reviewed: Basic Metabolic Panel:  Recent Labs Lab 01/15/13 0530 01/17/13 0330 01/20/13 0558  NA 143 145 139  K 4.6 3.9 3.5  CL 103 110 107  CO2 31 24 19   GLUCOSE 107* 106* 120*  BUN 35* 25* 14  CREATININE 1.11* 1.02 0.99  CALCIUM 8.0* 7.9* 7.2*  MG 2.1  --   --    Liver Function Tests:  Recent Labs Lab 01/20/13 0558  AST 27  ALT 24  ALKPHOS 63  BILITOT 0.2*  PROT 5.4*  ALBUMIN 1.5*   CBC:  Recent Labs Lab 01/17/13 0330 01/20/13 0558  WBC 10.6* 9.9  HGB 7.8* 7.2*  HCT 23.8* 21.2*  MCV 91.9 89.5  PLT 435* 512*   CBG:  Recent Labs Lab 01/16/13 1655 01/16/13 1931 01/16/13 2358 01/17/13 0320 01/17/13 0831  GLUCAP 107* 101* 109* 104* 106*    Recent Results (from the past 240 hour(s))  CULTURE, BLOOD (ROUTINE X 2)     Status: None   Collection Time    01/15/13  4:30 PM      Result Value Range Status   Specimen Description BLOOD HAND RIGHT   Final   Special Requests BOTTLES DRAWN AEROBIC AND ANAEROBIC 10CC   Final   Culture  Setup Time 01/16/2013 00:44   Final   Culture     Final   Value:        BLOOD CULTURE RECEIVED NO GROWTH TO DATE CULTURE WILL BE HELD FOR 5 DAYS BEFORE ISSUING A FINAL NEGATIVE REPORT   Report Status PENDING   Incomplete  CULTURE, BLOOD (ROUTINE X 2)     Status: None   Collection Time    01/15/13  4:35 PM      Result Value Range Status    Specimen Description BLOOD CENTRAL LINE   Final   Special Requests     Final   Value: BOTTLES DRAWN AEROBIC AND ANAEROBIC 10CC AER 5CC ANA   Culture  Setup Time 01/16/2013 00:44   Final   Culture     Final   Value:        BLOOD CULTURE RECEIVED NO GROWTH TO DATE CULTURE WILL BE HELD FOR 5 DAYS BEFORE ISSUING A FINAL NEGATIVE REPORT   Report Status PENDING   Incomplete     Studies:  Recent x-ray studies have been reviewed in detail by the Attending Physician  Scheduled Meds:  Scheduled Meds: . antiseptic oral rinse  15 mL Mouth Rinse QID  . aspirin  325 mg Oral Daily  . chlorhexidine  15 mL Mouth Rinse BID  . feeding supplement  237 mL Oral BID WC  . feeding supplement  1 Container Oral TID BM  . feeding supplement  30 mL  Oral TID WC  . heparin subcutaneous  5,000 Units Subcutaneous Q8H  . labetalol  200 mg Oral BID  . lactulose  10 g Oral BID  . levothyroxine  88 mcg Oral QAC breakfast    Time spent on care of this patient: 35 mins including discussion with patient and family regarding treatment plan   Cherene Altes, MD  [facility] Office  [phone] Pager - Text Page per Shea Evans as per below:  On-Call/Text Page:      [email or web address]      password [email or web address]  If 7PM-7AM, please contact night-coverage [email or web address] Password [email or web address] 01/20/2013, 5:11 PM   LOS: 13 days

## 2013-01-20 NOTE — Progress Notes (Signed)
Speech Language Pathology Dysphagia Treatment Patient Details Name: [name] [name] MRN: [id number] DOB: June 15, 1933 Today's Date: 01/20/2013 Time: [id number] SLP Time Calculation (min): 20 min  Assessment / Plan / Recommendation Clinical Impression  SLP paged to reassess swallow for possible diet upgrade from dysphagia 1 and nectar thick liquids.  Oral care prior to PO's indicated oral residue from pocketing right buccal area from noon meal.  Mastication extended but functional for soft solids with min to moderate verbal cues required to complete strategies of tongue sweep to reduce oral residue.  Continues with delay in initiation resulting in delayed s/s of penetration vs. Aspiration with cup sips of thin liquids consistent with documentation from MBS.   Patient tolerated thin water by teaspoon with no outward s/s of aspiration. Recommend to upgrade to dysphagia 2 and continue nectar thick liquids due to continued s/s of aspiration with thin liquid by cup sips and continued required cues to complete strategies.  Continue with full supervision with all meals to provide necessary cues and monitor for pocketing to ensure safety.  Defer upgrade of solids to treating SLP at next level of care.  Recommend repeat objective evaluation prior to upgrade of liquids.    Diet Recommendation    Dysphagia 2/nectar thick liquids    SLP Plan   Continued ST treatment at next level of care      Swallowing Goals  SLP Swallowing Goals Swallow Study Goal #2 - Progress: Progressing toward goal  General Temperature Spikes Noted: No Respiratory Status: Room air Behavior/Cognition: Alert;Cooperative;Pleasant mood Oral Cavity - Dentition: Adequate natural dentition  Oral Cavity - Oral Hygiene Does patient have any of the following "at risk" factors?: Other - dysphagia;Diet - patient on thickened liquids Patient is HIGH RISK - Oral Care Protocol followed (see row info): Yes Patient is AT RISK - Oral Care Protocol  followed (see row info): Yes   Dysphagia Treatment Treatment focused on: Skilled observation of diet tolerance;Upgraded PO texture trials;Facilitation of pharyngeal phase;Facilitation of oral preparatory phase;Facilitation of oral phase Family/Caregiver Educated: Daughters/spouse Treatment Methods/Modalities: Skilled observation;Differential diagnosis Patient observed directly with PO's: Yes Type of PO's observed: Dysphagia 3 (soft);Dysphagia 2 (chopped);Dysphagia 1 (puree);Thin liquids;Nectar-thick liquids Feeding: Able to feed self;Needs assist Liquids provided via: Cup;Teaspoon Oral Phase Signs & Symptoms: Right pocketing Pharyngeal Phase Signs & Symptoms: Suspected delayed swallow initiation;Delayed throat clear Type of cueing: Verbal;Visual Amount of cueing: Minimal   GO    Sharman Crate [place], [place] [facility] 01/20/2013, 6:16 PM

## 2013-01-21 LAB — BASIC METABOLIC PANEL
BUN: 14 mg/dL (ref 6–23)
Chloride: 107 mEq/L (ref 96–112)
Glucose, Bld: 108 mg/dL — ABNORMAL HIGH (ref 70–99)
Potassium: 3.9 mEq/L (ref 3.5–5.1)

## 2013-01-21 LAB — TYPE AND SCREEN
ABO/RH(D): AB NEG
Antibody Screen: NEGATIVE

## 2013-01-21 LAB — CBC
HCT: 25.5 % — ABNORMAL LOW (ref 36.0–46.0)
Hemoglobin: 8.6 g/dL — ABNORMAL LOW (ref 12.0–15.0)
MCH: 30.2 pg (ref 26.0–34.0)
MCHC: 33.7 g/dL (ref 30.0–36.0)

## 2013-01-21 LAB — LIPASE, BLOOD: Lipase: 42 U/L (ref 11–59)

## 2013-01-21 LAB — TROPONIN I: Troponin I: <0.3 ng/mL (ref <0.30)

## 2013-01-21 MED ORDER — OXYCODONE HCL 5 MG PO TABS
5.0000 mg | ORAL_TABLET | ORAL | Status: DC | PRN
  Administered 2013-01-21 – 2013-01-22 (×2): 5 mg via ORAL
  Filled 2013-01-21 (×2): qty 1

## 2013-01-21 NOTE — Progress Notes (Signed)
[facility] Progress Note [place] TEAM 1 - Stepdown/ICU TEAM   [name] [name] [id number] DOB: [birth year] DOA: 01/07/2013 PCP: Walker Kehr, MD  Brief narrative: 79 y/o who was admitted to [facility] 5/4 with acute pancreatitis later diagnosed to be gallstone pancreatitis. She developed oliguric renal failure and malignant HTN at [facility], and was transferred to [facility] on 5/5.  She became confused on 5/5 and on 5/6 was noted to have slurred speech and right sided facial weakness. Code Stroke was called but pt was not a candidate for TPA due to inability to pinpoint when she was last seen without these symptoms. MRI revealed embolic infarcts.   5/4 Admitted to [place] with acute pancreatitis  5/4 Abdominal US >>> Gallbladder stones, CBD 4 mm  5/4 Abdomen CT >>> Diffuse acute pancreatitis, CBD mildly dilated, no mass, dilated gallbladder with gallstones, no hydronephrosis  5/5 Transferred to [facility]  5/6 slurred speech and R facial weakness noted >> Code Stroke 5/6 Head CT>>>Scattered gray and white matter edema. Posterior circulation predominant but also left MCA involvement suspected. No associated mass effect or hemorrhage. Top differential considerations include subacute embolic infarcts and posterior reversible encephalopathy syndrome.  5/7 patient intubated due to increasing WOB  5/8 extubated  Assessment/Plan:  Bilateral chest pain  -Symptoms occurred shortly after eating - suspect this was esophageal spasm related to recent advancement of diet - check lipase to assure this does not represent recurrence of pancreatitis - EKG does reveal T-wave inversions in lateral leads - will therefore cycle cardiac enzymes and recheck EKG in a.m. - if these measures are unrevealing hopefully the patient will be stable for discharge 01/22/2013  Acute gallstone pancreatitis - felt to be related to passed gallstone - will need eventual outpt surgical eval for cholecystectomy - skilled  nursing facility will need to arrange outpatient general surgery clinic visit - discussed need to delay "elective" surgery in post CVA period with family - pain resolved  Acute renal failure -Resolved  -baseline Cr reportedly ~1.5  Bilateral supratentorial and cerebellar acute / subacute infarcts with dysarthria -limited MRI but probable embolic infarcts noted   2D Echocardiogram EF 80% There is severe septal thickening consistent with hypertrophic cardiomyopathy.    Carotid Doppler Bilateral: No evidence of hemodynamically significant internal carotid artery stenosis. Vertebral artery flow is antegrade.    Transcranial Dopplers Normal mean flow velocities. Globally elevated pulsatility indices suggest diffuse intracranial atherosclerosis -currently on ASA 325 (was on 81 at home) -TEE could not be completed due to excessive secretions in airway -Per Neuro, pt will need event monitor for 3 wks rather than re-attempting TEE - Dr. Wynelle Cleveland discussed this with Trish from [facility] and it will be set up as oupt (takes 3-4 days to get approved)  - cont SLP   Dysphagia -passed SLP eval and D1 with nectar thick ordered per recommendations - a followup SLP eval has cleared patient for dysphagia 2 with nectar thick liquids -nutrition consult   Pneumonia - possible aspiration pna vs/ pneumonitis vs/ atx - completed course of Unasyn - fevers resolved and O2 requirements have resolved (on RA)  Malignant HTN - BP now better controlled  Hypertrophic cardiomyopathy  -cont B Blocker and maintain adequate preload -EF 80%  Hyperlipidemia  -LDL 77, not on statin PTA, at goal LDL < 100  Hypokalemia -replaced/corrected   Normocytic Anemia Drop in Hgb likely due to hydration as well as poor intake in setting of acute disease - transfuse 1 unit packed red blood  cells with greater than expected response - will need to be followed in the outpatient setting  Code Status: FULL Family Communication:   Plan discussed with patient and daughter at bedside Disposition Plan: plan for d/c to [facility] in [place] on Tuesday    Consultants: Neurology [facility]   DVT prophylaxis: SQ Heparin  HPI/Subjective: This morning, shortly after eating breakfast, the patient experienced severe squeezing type chest pain across both the right and left chest.  An EKG was accomplished and revealed only T-wave inversions in the lateral leads with no ST segment changes.  The patient was given pain medication and her symptoms rapidly improved.  Objective: Blood pressure 115/39, pulse 63, temperature 98.5 F (36.9 C), temperature source Oral, resp. rate 20, height 5\' 3"  (1.6 m), weight 70.1 kg (154 lb 8.7 oz), SpO2 97.00%.  Intake/Output Summary (Last 24 hours) at 01/21/13 1535 Last data filed at 01/21/13 0900  Gross per 24 hour  Intake 930.17 ml  Output    150 ml  Net 780.17 ml    Exam: General: No acute respiratory distress - resting comfortably at time of my exam Lungs: No wheezing - good air movement throughout all fields  Cardiovascular: Regular rate and rhythm without murmur gallop or rub  Abdomen: Nontender, nondistended, soft, bowel sounds positive, no rebound, no ascites, no appreciable mass Extremities: No significant cyanosis, clubbing, or edema bilateral lower extremities   Data Reviewed: Basic Metabolic Panel:  Recent Labs Lab 01/15/13 0530 01/17/13 0330 01/20/13 0558 01/21/13 0615  NA 143 145 139 138  K 4.6 3.9 3.5 3.9  CL 103 110 107 107  CO2 31 24 19  18*  GLUCOSE 107* 106* 120* 108*  BUN 35* 25* 14 14  CREATININE 1.11* 1.02 0.99 0.94  CALCIUM 8.0* 7.9* 7.2* 7.4*  MG 2.1  --   --   --    Liver Function Tests:  Recent Labs Lab 01/20/13 0558  AST 27  ALT 24  ALKPHOS 63  BILITOT 0.2*  PROT 5.4*  ALBUMIN 1.5*   CBC:  Recent Labs Lab 01/17/13 0330 01/20/13 0558 01/21/13 0615  WBC 10.6* 9.9 9.6  HGB 7.8* 7.2* 8.6*  HCT 23.8* 21.2* 25.5*  MCV 91.9 89.5  89.5  PLT 435* 512* 534*   CBG:  Recent Labs Lab 01/16/13 1655 01/16/13 1931 01/16/13 2358 01/17/13 0320 01/17/13 0831  GLUCAP 107* 101* 109* 104* 106*    Recent Results (from the past 240 hour(s))  CULTURE, BLOOD (ROUTINE X 2)     Status: None   Collection Time    01/15/13  4:30 PM      Result Value Range Status   Specimen Description BLOOD HAND RIGHT   Final   Special Requests BOTTLES DRAWN AEROBIC AND ANAEROBIC 10CC   Final   Culture  Setup Time 01/16/2013 00:44   Final   Culture     Final   Value:        BLOOD CULTURE RECEIVED NO GROWTH TO DATE CULTURE WILL BE HELD FOR 5 DAYS BEFORE ISSUING A FINAL NEGATIVE REPORT   Report Status PENDING   Incomplete  CULTURE, BLOOD (ROUTINE X 2)     Status: None   Collection Time    01/15/13  4:35 PM      Result Value Range Status   Specimen Description BLOOD CENTRAL LINE   Final   Special Requests     Final   Value: BOTTLES DRAWN AEROBIC AND ANAEROBIC 10CC AER 5CC ANA   Culture  Setup Time 01/16/2013 00:44   Final   Culture     Final   Value:        BLOOD CULTURE RECEIVED NO GROWTH TO DATE CULTURE WILL BE HELD FOR 5 DAYS BEFORE ISSUING A FINAL NEGATIVE REPORT   Report Status PENDING   Incomplete  CLOSTRIDIUM DIFFICILE BY PCR     Status: None   Collection Time    01/20/13  5:41 PM      Result Value Range Status   C difficile by pcr NEGATIVE  NEGATIVE Final     Studies:  Recent x-ray studies have been reviewed in detail by the Attending Physician  Scheduled Meds:  Scheduled Meds: . antiseptic oral rinse  15 mL Mouth Rinse QID  . aspirin  325 mg Oral Daily  . chlorhexidine  15 mL Mouth Rinse BID  . feeding supplement  237 mL Oral BID WC  . feeding supplement  1 Container Oral TID BM  . feeding supplement  30 mL Oral TID WC  . heparin subcutaneous  5,000 Units Subcutaneous Q8H  . labetalol  200 mg Oral BID  . levothyroxine  88 mcg Oral QAC breakfast    Time spent on care of this patient: 35 mins including discussion  with patient and daughter/husband regarding treatment plan   Cherene Altes, MD  [facility] Office  [phone] Pager - Text Page per Shea Evans as per below:  On-Call/Text Page:      [email or web address]      password [email or web address]  If 7PM-7AM, please contact night-coverage [email or web address] Password [email or web address] 01/21/2013, 3:35 PM   LOS: 14 days

## 2013-01-21 NOTE — Progress Notes (Signed)
Pt refusing Pro stat, ensure pudding and ensure shake. Family in room and aware.

## 2013-01-21 NOTE — Progress Notes (Signed)
Pt c/o all over excruciating cp. 92 Saton ra. Placedpt on 2L sat 99. Bp 135/45 HR 69 no changes noted on 12 lead bedside EKG. Oyxcodone 5 mg po given. Pt resting quietly coices no complaints

## 2013-01-21 NOTE — Progress Notes (Signed)
PT Cancellation Note  Patient Details Name: [name] [name] MRN: [id number] DOB: 16-Jul-1933   Cancelled Treatment:    Reason Eval/Treat Not Completed: Fatigue/lethargy limiting ability to participate.   [place] 01/21/2013, 1:38 PM

## 2013-01-22 DIAGNOSIS — R1310 Dysphagia, unspecified: Secondary | ICD-10-CM

## 2013-01-22 LAB — CBC
MCH: 30.4 pg (ref 26.0–34.0)
MCHC: 33.6 g/dL (ref 30.0–36.0)
MCV: 90.4 fL (ref 78.0–100.0)
Platelets: 505 10*3/uL — ABNORMAL HIGH (ref 150–400)
RBC: 2.93 MIL/uL — ABNORMAL LOW (ref 3.87–5.11)
RDW: 14.8 % (ref 11.5–15.5)

## 2013-01-22 LAB — BASIC METABOLIC PANEL
BUN: 13 mg/dL (ref 6–23)
CO2: 18 mEq/L — ABNORMAL LOW (ref 19–32)
Calcium: 7.7 mg/dL — ABNORMAL LOW (ref 8.4–10.5)
Creatinine, Ser: 1.04 mg/dL (ref 0.50–1.10)
GFR calc non Af Amer: 50 mL/min — ABNORMAL LOW (ref >90)
Glucose, Bld: 109 mg/dL — ABNORMAL HIGH (ref 70–99)
Sodium: 135 mEq/L (ref 135–145)

## 2013-01-22 LAB — CULTURE, BLOOD (ROUTINE X 2)
Culture: NO GROWTH
Culture: NO GROWTH

## 2013-01-22 MED ORDER — ENSURE COMPLETE PO LIQD
237.0000 mL | Freq: Three times a day (TID) | ORAL | Status: DC
  Administered 2013-01-22 – 2013-01-23 (×2): 237 mL via ORAL

## 2013-01-22 NOTE — Progress Notes (Signed)
Speech Language Pathology Dysphagia Treatment Patient Details Name: EUN SURRATT MRN: [id number] DOB: 07-20-1933 Today's Date: 01/22/2013 Time: 0820-0910 SLP Time Calculation (min): 50 min  Assessment / Plan / Recommendation Clinical Impression  Treatment session focused on facilitation of functional communication and cognition. Pt required max tactile and contextual cues for basic problem solving and initation with use of call bell and self feeding. Pt responded well to min question cues and choice cues to express basic wants and needs. Automatic speech at phrase level is improving though min verbal cues were occasionally required. The pt was observed to tolerate a dys 2/fine chopped diet well, no s/s of aspiration observed with nectar thick liquids. Suspect pt will need to continue this diet after d/c to SNF. SLp will contineu to follow in acute care.     Diet Recommendation       SLP Plan     Pertinent Vitals/Pain NA   Swallowing Goals     General Temperature Spikes Noted: No Respiratory Status: Room air Behavior/Cognition: Alert;Cooperative;Pleasant mood Oral Cavity - Dentition: Adequate natural dentition Patient Positioning: Upright in bed  Oral Cavity - Oral Hygiene Does patient have any of the following "at risk" factors?: Other - dysphagia;Diet - patient on thickened liquids Patient is AT RISK - Oral Care Protocol followed (see row info): Yes   Dysphagia Treatment     GO    Herbie Baltimore, MA CCC-SLP [phone]  Lynann Beaver 01/22/2013, 9:39 AM

## 2013-01-22 NOTE — Progress Notes (Signed)
Physical Therapy Treatment Patient Details Name: [name] [name] MRN: [id number] DOB: 1932-10-09 Today's Date: 01/22/2013 Time: 1015-1100 PT Time Calculation (min): 45 min  PT Assessment / Plan / Recommendation Comments on Treatment Session  Pt unable to tolerate standing secondary to dizziness.  Uanble to assess pt blood pressure in sitting.  Pt BP in supine was 109/39 aftrer approximately 10 minutes lying in supine.       Follow Up Recommendations  CIR     Does the patient have the potential to tolerate intense rehabilitation     Barriers to Discharge        Equipment Recommendations  Other (comment)    Recommendations for Other Services Rehab consult  Frequency Min 4X/week   Plan Discharge plan remains appropriate    Precautions / Restrictions Precautions Precautions: Fall Precaution Comments: expressive aphasia; dense right UE hemiplegia Restrictions Weight Bearing Restrictions: No   Pertinent Vitals/Pain     Mobility  Bed Mobility Bed Mobility: Rolling Right;Rolling Left;Left Sidelying to Sit;Sit to Sidelying Left Rolling Right: 4: Min assist;With rail Rolling Right: Patient Percentage: 90% Rolling Left: 3: Mod assist Rolling Left: Patient Percentage: 70% Right Sidelying to Sit: Not tested (comment) Left Sidelying to Sit: 3: Mod assist Left Sidelying to Sit: Patient Percentage: 50% Sit to Sidelying Left: 3: Mod assist;HOB flat;With rail Details for Bed Mobility Assistance: VCs to roll to right; Manual facilitation to manage RUE and initiate trunk rotation; assist to raise shoulders from bed to sit up from L side; Assist for bilateral LEs to return to L sidelying.    Transfers Transfers: Sit to Stand;Stand to Sit Sit to Stand: 2: Max assist;From bed;With upper extremity assist Sit to Stand: Patient Percentage: 40% Stand to Sit: 2: Max assist Stand to Sit: Patient Percentage: 50% Stand Pivot Transfers: Not tested (comment) Details for Transfer Assistance: Assist  to initiate standing, assist to manage R UE; pt only able to stand for a few seconds before sitting.  PT managed RUE for pt.    Ambulation/Gait Ambulation/Gait Assistance: Not tested (comment)    Exercises     PT Diagnosis:    PT Problem List:   PT Treatment Interventions:     PT Goals Acute Rehab PT Goals PT Goal Formulation: Patient unable to participate in goal setting Time For Goal Achievement: 01/25/13 Potential to Achieve Goals: Fair Pt will go Supine/Side to Sit: with supervision PT Goal: Supine/Side to Sit - Progress: Progressing toward goal Pt will Sit at Edge of Bed: with supervision PT Goal: Sit at Edge Of Bed - Progress: Progressing toward goal Pt will go Sit to Stand: with min assist PT Goal: Sit to Stand - Progress: Progressing toward goal Pt will Transfer Bed to Chair/Chair to Bed: with min assist PT Transfer Goal: Bed to Chair/Chair to Bed - Progress: Progressing toward goal  Visit Information  Last PT Received On: 01/22/13 Assistance Needed: +2    Subjective Data  Subjective: I don't feel good. Patient Stated Goal: none stated   Cognition  Cognition Arousal/Alertness: Lethargic Behavior During Therapy: WFL for tasks assessed/performed Overall Cognitive Status: Impaired/Different from baseline Area of Impairment: Attention;Problem solving Current Attention Level: Sustained Following Commands: Follows one step commands inconsistently Awareness: Intellectual Problem Solving: Slow processing;Decreased initiation;Requires verbal cues;Requires tactile cues General Comments: improving but still limited due to global aphasia Difficult to assess due to: Level of arousal    Balance  Balance Balance Assessed: Yes Static Sitting Balance Static Sitting - Balance Support: No upper extremity supported;Left upper extremity  supported;Feet supported Static Sitting - Level of Assistance: 4: Min assist Static Sitting - Comment/# of Minutes: pt sate on EOB for  15+minutes with VCs for upright posture.  Pt repeatedly leaning to the right secondary to fatigure and dizzinesss.    End of Session PT - End of Session Equipment Utilized During Treatment: Gait belt Activity Tolerance: Patient limited by fatigue Patient left: in bed;with call bell/phone within reach;with family/visitor present Nurse Communication: Mobility status   GP     [name] [name] 01/22/2013, 1:51 PM [name] [name] DPT  Pager [phone]     Cell [phone]

## 2013-01-22 NOTE — Progress Notes (Signed)
Physical Therapy Treatment Patient Details Name: [name] [name] MRN: [id number] DOB: 11-26-1932 Today's Date: 01/22/2013 Time: [id number] PT Time Calculation (min): 45 min  PT Assessment / Plan / Recommendation Comments on Treatment Session  Pt much improved from morning session.  Able to ambulate with encouragement.      Follow Up Recommendations  CIR     Does the patient have the potential to tolerate intense rehabilitation     Barriers to Discharge        Equipment Recommendations  Other (comment)    Recommendations for Other Services Rehab consult  Frequency Min 4X/week   Plan Discharge plan remains appropriate    Precautions / Restrictions Precautions Precautions: Fall Precaution Comments: expressive aphasia; dense right UE hemiplegia Restrictions Weight Bearing Restrictions: No   Pertinent Vitals/Pain No c/o pain.       Mobility  Bed Mobility Bed Mobility: Supine to Sit Supine to Sit: 3: Mod assist;HOB elevated Supine to Sit: Patient Percentage: 50% Details for Bed Mobility Assistance: Assist to raise trunk from bed and manage R UE.   Transfers Transfers: Sit to Stand;Stand to Sit Sit to Stand: 3: Mod assist;From bed;From chair/3-in-1;With upper extremity assist (3 trials) Sit to Stand: Patient Percentage: 60% Stand to Sit: 3: Mod assist;To chair/3-in-1;With upper extremity assist (3 trials) Stand to Sit: Patient Percentage: 60% Stand Pivot Transfers: 3: Mod assist Stand Pivot Transfers: Patient Percentage: 60% Details for Transfer Assistance: Assist to initiate standing, pt wearing sling on RUE.  Cues for sequencing to stand pivot from bed to 3 in 1 and from 3 in 1 to recliner.  Pt attempts to sit prematurely required manual facilitation for hand placement on armrest.   Ambulation/Gait Ambulation/Gait Assistance: 2: Max assist Ambulation Distance (Feet): 10 Feet Assistive device: Rolling walker Ambulation/Gait Assistance Details: manual faciliation via  anterior wt shift to initiate gait.  Manual facilitation to steady pt for balance. Close contact with pt to relieve anxiety.   Gait Pattern: Step-to pattern;Decreased stride length;Festinating General Gait Details: Pt required max encouragement to progress distance.   Stairs: No Wheelchair Mobility Wheelchair Mobility: No    Exercises     PT Diagnosis:    PT Problem List:   PT Treatment Interventions:     PT Goals Acute Rehab PT Goals PT Goal Formulation: With patient Time For Goal Achievement: 01/25/13 Potential to Achieve Goals: Fair Pt will go Supine/Side to Sit: with supervision PT Goal: Supine/Side to Sit - Progress: Progressing toward goal Pt will Sit at Edge of Bed: with supervision PT Goal: Sit at Edge Of Bed - Progress: Progressing toward goal Pt will go Sit to Stand: with min assist PT Goal: Sit to Stand - Progress: Progressing toward goal Pt will Transfer Bed to Chair/Chair to Bed: with min assist PT Transfer Goal: Bed to Chair/Chair to Bed - Progress: Progressing toward goal Pt will Ambulate: 16 - 50 feet;with mod assist;with least restrictive assistive device PT Goal: Ambulate - Progress: Goal set today  Visit Information  Last PT Received On: 01/22/13    Subjective Data      Cognition  Cognition Arousal/Alertness: Awake/alert Behavior During Therapy: [facility] for tasks assessed/performed Overall Cognitive Status: Impaired/Different from baseline Area of Impairment: Attention;Problem solving Current Attention Level: Sustained Following Commands: Follows one step commands inconsistently Awareness: Intellectual Problem Solving: Slow processing;Decreased initiation;Requires verbal cues;Requires tactile cues    Balance     End of Session PT - End of Session Equipment Utilized During Treatment: Gait belt Activity Tolerance: Patient limited by  fatigue Patient left: in chair;with call bell/phone within reach;with family/visitor present Nurse Communication:  Mobility status   GP     Zedekiah Hinderman 01/22/2013, 5:54 PM Jlon Betker L. Nyoka Cowden DPT  pager [phone]    Cell [phone]

## 2013-01-22 NOTE — Progress Notes (Addendum)
[facility] Progress Note [place] TEAM 1 - Stepdown/ICU TEAM   Lyla Skora Jaskulski [id number] DOB: 03/26/1933 DOA: 01/07/2013 PCP: Walker Kehr, MD  Brief narrative: 79 y/o who was admitted to [facility] 5/4 with acute pancreatitis later diagnosed to be gallstone pancreatitis. She developed oliguric renal failure and malignant HTN at [facility], and was transferred to [facility] on 5/5.  She became confused on 5/5 and on 5/6 was noted to have slurred speech and right sided facial weakness. Code Stroke was called but pt was not a candidate for TPA due to inability to pinpoint when she was last seen without these symptoms. MRI revealed embolic infarcts.   5/4 Admitted to [facility] with acute pancreatitis  5/4 Abdominal US >>> Gallbladder stones, CBD 4 mm  5/4 Abdomen CT >>> Diffuse acute pancreatitis, CBD mildly dilated, no mass, dilated gallbladder with gallstones, no hydronephrosis  5/5 Transferred to [facility]  5/6 slurred speech and R facial weakness noted >> Code Stroke 5/6 Head CT>>>Scattered gray and white matter edema. Posterior circulation predominant but also left MCA involvement suspected. No associated mass effect or hemorrhage. Top differential considerations include subacute embolic infarcts and posterior reversible encephalopathy syndrome.  5/7 patient intubated due to increasing WOB  5/8 extubated  Assessment/Plan:  Bilateral chest pain 5/19  -Symptoms occurred shortly after eating - suspect this was esophageal spasm related to recent advancement of diet -  lipase normal- EKG does reveal T-wave inversions in lateral leads but Troponins normal  Acute gallstone pancreatitis - felt to be related to passed gallstone - will need eventual outpt surgical eval for cholecystectomy - skilled nursing facility will need to arrange outpatient general surgery clinic visit - discussed need to delay "elective" surgery in post CVA period with family - pain resolved  Acute renal  failure -Resolved  -baseline Cr reportedly ~1.5  Bilateral supratentorial and cerebellar acute / subacute infarcts with dysarthria -limited MRI but probable embolic infarcts noted   2D Echocardiogram EF 80% There is severe septal thickening consistent with hypertrophic cardiomyopathy.    Carotid Doppler Bilateral: No evidence of hemodynamically significant internal carotid artery stenosis. Vertebral artery flow is antegrade.    Transcranial Dopplers Normal mean flow velocities. Globally elevated pulsatility indices suggest diffuse intracranial atherosclerosis -currently on ASA 325 (was on 81 at home) -TEE could not be completed due to excessive secretions in airway -Per Neuro, pt will need event monitor for 3 wks rather than re-attempting TEE - Dr. Wynelle Cleveland discussed this with Trish from [facility] and it will be set up as oupt (takes 3-4 days to get approved)  - cont SLP   Dysphagia -passed SLP eval and D1 with nectar thick ordered per recommendations - a followup SLP eval has cleared patient for dysphagia 2 with nectar thick liquids -nutrition consult   Pneumonia - possible aspiration pna vs/ pneumonitis vs/ atx - completed course of Unasyn - fevers resolved and O2 requirements have resolved (on RA)  Malignant HTN - BP now better controlled  Hypertrophic cardiomyopathy  -cont B Blocker and maintain adequate preload -EF 80%  Hyperlipidemia  -LDL 77, not on statin PTA, at goal LDL < 100  Hypokalemia -replaced/corrected   Normocytic Anemia Drop in Hgb likely due to hydration as well as poor intake in setting of acute disease - transfused 1 unit packed red blood cells with greater than expected response - will need to be followed in the outpatient setting  Code Status: FULL Family Communication:  Plan discussed with patient and daughter at bedside  Disposition Plan: plan for d/c to [facility] in [place] on Wednesday   Consultants: Neurology [facility]   DVT  prophylaxis: SQ Heparin  HPI/Subjective: Very somnolent this AM after receiving one Oxycodone at 5 AM therefore narcotic discontinued- did not wake up until close to 3 PM. Pt is not in any pain now. Daughter hesitant to let her leave today - have asked PT to come back and ambulate pt today. Will need to be discharged in AM- have discussed with daughter that patient will benefit from more rehab at [facility] rather than staying in the hospital further. Encouraging PO intake esp of milkshake-like products- will need to d/c on TID or QID Ensure. Have also discussed with social worker, Lorriane Shire, who has spoken with [facility] to ensure that they will hold the bed until tomorrow.   Objective: Blood pressure 96/55, pulse 63, temperature 97 F (36.1 C), temperature source Oral, resp. rate 18, height 5\' 3"  (1.6 m), weight 70.8 kg (156 lb 1.4 oz), SpO2 92.00%.  Intake/Output Summary (Last 24 hours) at 01/22/13 1830 Last data filed at 01/22/13 1300  Gross per 24 hour  Intake    570 ml  Output      0 ml  Net    570 ml    Exam: General: No acute respiratory distress - resting comfortably at time of my exam- much more verbal Lungs: No wheezing - good air movement throughout all fields  Cardiovascular: Regular rate and rhythm without murmur gallop or rub  Abdomen: Nontender, nondistended, soft, bowel sounds positive, no rebound, no ascites, no appreciable mass Extremities: No significant cyanosis, clubbing, or edema bilateral lower extremities   Data Reviewed: Basic Metabolic Panel:  Recent Labs Lab 01/17/13 0330 01/20/13 0558 01/21/13 0615 01/22/13 0625  NA 145 139 138 135  K 3.9 3.5 3.9 3.9  CL 110 107 107 105  CO2 24 19 18* 18*  GLUCOSE 106* 120* 108* 109*  BUN 25* 14 14 13   CREATININE 1.02 0.99 0.94 1.04  CALCIUM 7.9* 7.2* 7.4* 7.7*   Liver Function Tests:  Recent Labs Lab 01/20/13 0558  AST 27  ALT 24  ALKPHOS 63  BILITOT 0.2*  PROT 5.4*  ALBUMIN 1.5*   CBC:  Recent Labs Lab  01/17/13 0330 01/20/13 0558 01/21/13 0615 01/22/13 0625  WBC 10.6* 9.9 9.6 8.9  HGB 7.8* 7.2* 8.6* 8.9*  HCT 23.8* 21.2* 25.5* 26.5*  MCV 91.9 89.5 89.5 90.4  PLT 435* 512* 534* 505*   CBG:  Recent Labs Lab 01/16/13 1655 01/16/13 1931 01/16/13 2358 01/17/13 0320 01/17/13 0831  GLUCAP 107* 101* 109* 104* 106*    Recent Results (from the past 240 hour(s))  CULTURE, BLOOD (ROUTINE X 2)     Status: None   Collection Time    01/15/13  4:30 PM      Result Value Range Status   Specimen Description BLOOD HAND RIGHT   Final   Special Requests BOTTLES DRAWN AEROBIC AND ANAEROBIC 10CC   Final   Culture  Setup Time 01/16/2013 00:44   Final   Culture NO GROWTH 5 DAYS   Final   Report Status 01/22/2013 FINAL   Final  CULTURE, BLOOD (ROUTINE X 2)     Status: None   Collection Time    01/15/13  4:35 PM      Result Value Range Status   Specimen Description BLOOD CENTRAL LINE   Final   Special Requests     Final   Value: BOTTLES DRAWN AEROBIC AND  ANAEROBIC 10CC AER 5CC ANA   Culture  Setup Time 01/16/2013 00:44   Final   Culture NO GROWTH 5 DAYS   Final   Report Status 01/22/2013 FINAL   Final  CLOSTRIDIUM DIFFICILE BY PCR     Status: None   Collection Time    01/20/13  5:41 PM      Result Value Range Status   C difficile by pcr NEGATIVE  NEGATIVE Final     Studies:  Recent x-ray studies have been reviewed in detail by the Attending Physician  Scheduled Meds:  Scheduled Meds: . antiseptic oral rinse  15 mL Mouth Rinse QID  . aspirin  325 mg Oral Daily  . chlorhexidine  15 mL Mouth Rinse BID  . feeding supplement  237 mL Oral BID WC  . feeding supplement  1 Container Oral TID BM  . feeding supplement  30 mL Oral TID WC  . heparin subcutaneous  5,000 Units Subcutaneous Q8H  . labetalol  200 mg Oral BID  . levothyroxine  88 mcg Oral QAC breakfast    Time spent on care of this patient: 35 mins including discussion with patient and daughter   Debbe Odea,  [place]  [facility] Office  [phone] Pager - Text Page per Shea Evans as per below:  On-Call/Text Page:      [email or web address]      password [email or web address]  If 7PM-7AM, please contact night-coverage [email or web address] Password [email or web address] 01/22/2013, 6:30 PM   LOS: 15 days

## 2013-01-22 NOTE — Progress Notes (Signed)
Visited pt as per charge nurse's recommendation. Pt appeared alert and seemed to have some difficulty articulating her thoughts. Pt was with her husband and two daughters. Pt's daughter stated that the pt had a bad experience last night with a nurse tech, and they had informed the charge nurse so that she would not get the same nurse tech again tonight. Pt appeared pleased by the end of my stay.   Elmarie Shiley Counselor Intern [facility]

## 2013-01-23 MED ORDER — ASPIRIN 325 MG PO TABS: 325.0000 mg | ORAL_TABLET | Freq: Every day | ORAL | Status: AC

## 2013-01-23 MED ORDER — LABETALOL HCL 200 MG PO TABS: 200.0000 mg | ORAL_TABLET | Freq: Two times a day (BID) | ORAL | Status: DC

## 2013-01-23 MED ORDER — STARCH (THICKENING) PO POWD: ORAL | Status: DC

## 2013-01-23 NOTE — Progress Notes (Signed)
Pt  D/c to  Rehab  . Family members  At  Bedside  To  Follow  Transport.. Report  Called  To  Receiving  facility

## 2013-01-23 NOTE — Discharge Summary (Signed)
Physician Discharge Summary  [name] [name] [id number] DOB: 29-Jun-1933 DOA: 01/07/2013  PCP: [name] Kehr, MD  Admit date: 01/07/2013 Discharge date: 01/23/2013  Time spent: 40 minutes  Recommendations for Outpatient Follow-up:  1. 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   Diet recommendation: Dysphagia II diet nectar thick liquids   Filed Weights   01/21/13 0450 01/22/13 0411 01/22/13 2124  Weight: 70.1 kg (154 lb 8.7 oz) 70.8 kg (156 lb 1.4 oz) 69.3 kg (152 lb 12.5 oz)    History of present illness:  79 yo patient with biliary pancreatitis admitted as a transfer from [place] after she developed ARF and AMS there .   Hospital Course:  Acute gallstone pancreatitis  - felt to be related to passed gallstone. LFTS WNL prior to discharge.   - will need eventual outpt surgical eval for cholecystectomy - skilled nursing facility will need to arrange outpatient general surgery clinic visit once she has improved post stroke.  - discussed need to delay "elective" surgery in post CVA period with family  - abdominal pain has resolved  Acute renal failure  -Resolved  -baseline Cr reportedly ~1.5  Bilateral supratentorial and cerebellar acute / subacute infarcts with dysarthria  -limited MRI  but probable embolic infarcts noted  2D Echocardiogram EF 80% There is severe septal thickening consistent with hypertrophic cardiomyopathy.  Carotid Doppler Bilateral: No evidence of hemodynamically significant internal carotid artery stenosis. Vertebral artery flow is antegrade.  Transcranial Dopplers Normal mean flow velocities. Globally elevated pulsatility indices suggest diffuse intracranial atherosclerosis  -currently on ASA 325 (was on 81 at home)  -TEE could not be completed due to excessive secretions in airway  -Per Neuro, pt will need event monitor for 3 wks rather than re-attempting TEE - Dr. Wynelle [name] discussed this with [name] [name] from [facility] and it will be set up as oupt (takes 3-4 days to get approved) - call [phone] to coordinate.  - cont PT Dysphagia  -passed SLP eval and was started on D1 with nectar thick liquids - did well then was advanced to dysphagia 2 nectar thick liquids. - needs Speech therapy f/u at SNF Pneumonia  - possible aspiration pna vs/ pneumonitis vs/ atx - completed course of Unasyn - fevers resolved and O2 requirements have resolved (on RA)  Malignant HTN  - BP now better controlled  Hypertrophic cardiomyopathy  -cont B Blocker and maintain adequate preload  -EF 80%  Hyperlipidemia  -LDL 77, not on statin PTA,  - at goal LDL < 100  Hypokalemia  -replaced/corrected  Normocytic Anemia  Drop in Hgb likely due to hydration as well as poor intake in setting of acute disease - transfused 1 unit packed red blood cells with greater than expected response - will need to be followed in the outpatient setting No overt bleeding  seen.    Procedures:  See above  Consultations:  PCCM  GI - [name] [name]  Neuro - Dr. Antony [name]   Discharge Exam: Filed Vitals:   01/22/13 1331 01/22/13 1849 01/22/13 2124 01/23/13 0447  BP: 96/55 126/61 155/54 135/43  Pulse: 63 69 68 69  Temp: 97 F (36.1 C) 98.5 F (36.9 C) 99.1 F (37.3 C) 99 F (37.2 C)   TempSrc: Oral Oral Oral Oral  Resp: 18 18 18 17   Height:      Weight:   69.3 kg (152 lb 12.5 oz)   SpO2: 92% 96% 95% 91%    General: alert Cardiovascular: rrr Respiratory: ctab    Discharge Instructions  Discharge Orders   Future Appointments Provider Department Dept Phone   04/22/2013 9:15 AM [name] Anger, MD [facility] [place] [phone]   04/22/2013 9:30 AM [name] Anger, MD [facility] [phone]   Future Orders Complete By Expires     Increase activity slowly  As directed         Medication List    STOP taking these medications       BIOTIN 5000 PO     gabapentin 100 MG capsule  Commonly known as:  NEURONTIN     hydrochlorothiazide 12.5 MG capsule  Commonly known as:  MICROZIDE     HYDROcodone-acetaminophen 10-325 MG per tablet  Commonly known as:  NORCO      TAKE these medications       albuterol 108 (90 BASE) MCG/ACT inhaler  Commonly known as:  PROVENTIL HFA;VENTOLIN HFA  Inhale into the lungs every 4 (four) hours as needed for wheezing.     aspirin 325 MG tablet  Take 1 tablet (325 mg total) by mouth daily.     cholecalciferol 1000 UNITS tablet  Commonly known as:  VITAMIN D  Take 1,000 Units by mouth daily.     cyanocobalamin 1000 MCG/ML injection  Commonly known as:  (VITAMIN B-12)  Inject 1 mL (1,000 mcg total) into the skin every 14 (fourteen) days.     folic acid Q000111Q MCG tablet  Commonly known as:  FOLVITE  Take 400 mcg by mouth daily.     food thickener Powd  Commonly known as:  THICK IT  Thick all liquids     labetalol 200 MG tablet  Commonly known as:  NORMODYNE  Take 1 tablet (200 mg total) by mouth 2 (two) times daily.     levothyroxine 88 MCG tablet  Commonly known as:  SYNTHROID, LEVOTHROID  TAKE 1 TABLET DAILY     omeprazole 40 MG capsule  Commonly known as:  PRILOSEC  Take 1 capsule (40 mg total) by mouth daily.       Allergies  Allergen Reactions  .  Amlodipine Besylate     REACTION: edema  . Cefuroxime Axetil     REACTION: nausea  . Nitrofurantoin     REACTION: nausea  . Sulfadiazine        Follow-up Information   Schedule an appointment as soon as possible for a visit with [name] Kehr, MD. (after released from rehab )    Contact information:   [place] [address] [place] [address] [phone]       Follow up with Forbes Cellar, MD. Schedule an appointment as soon as possible for a visit in 1 month.   Contact information:   [place] [address]  [phone]       Please follow up.   Contact information:   SNF MD       The results of significant diagnostics from this hospitalization (including imaging, microbiology, ancillary and laboratory) are listed below for reference.    Significant Diagnostic Studies: Ct Head Wo Contrast  01/08/2013   *RADIOLOGY REPORT*  Clinical Data: 79 year old female Code stroke.  Facial droop and slurred speech.  CT HEAD WITHOUT CONTRAST  Technique:  Contiguous axial images were obtained from the base of the skull through the vertex without contrast.  Comparison: 10/30/2009 and earlier.  Findings: Minor maxillary sinus mucosal thickening.  Other Visualized paranasal sinuses and mastoids are clear.  No acute orbit or scalp soft tissue findings. No acute osseous abnormality identified.  Calcified atherosclerosis at the skull base.  Scattered posterior circulation hypodensity involving both cortex and white matter. Confluent area of cytotoxic edema pattern hypodensity in the right occipital pole, but bilateral occipital and parietal involvement. Also suspect early subcortical white matter involvement in the left frontal lobe, most confluent area on image 19.  Hypodensity also in the central cerebellum left greater than right is new.  No definite acute intracranial hemorrhage.  No mass effect associated with the above.  No ventriculomegaly.   Basilar cisterns remain patent.,  No suspicious intracranial vascular hyperdensity.  IMPRESSION: Scattered gray and white matter edema.  Posterior circulation predominant but also left MCA involvement suspected.  No associated mass effect or hemorrhage. Top differential considerations include subacute embolic infarcts and posterior reversible encephalopathy syndrome.  Critical Value/emergent results were called by telephone at the time of interpretation on 01/08/2013 at 0341 hours to Dr. Leonel Ramsay, who verbally acknowledged these results.   Original Report Authenticated By: Roselyn Reef, M.D.   Ct Angio Chest Pe W/cm &/or Wo Cm  01/15/2013   *RADIOLOGY REPORT*  Clinical Data: Cough.  CT ANGIOGRAPHY CHEST  Technique:  Multidetector CT imaging of the chest using the standard protocol during bolus administration of intravenous contrast. Multiplanar reconstructed images including MIPs were obtained and reviewed to evaluate the vascular anatomy.  Contrast: 184mL OMNIPAQUE IOHEXOL 350 MG/ML SOLN  Comparison: Chest x-ray 01/15/2013  Findings: There are small bilateral pleural effusions.  Airspace opacities are noted dependently in both lower lobes which represent atelectasis or infiltrates/pneumonia.  No filling defects in the pulmonary arteries to suggest pulmonary emboli.  There are rounded airspace opacities noted peripherally in both upper lobes, right greater than left.  I suspect these are infectious or inflammatory.  Heart is borderline enlarged.  Dense coronary artery calcifications are noted.  No evidence of aortic aneurysm or dissection. No mediastinal, hilar, or axillary adenopathy.  Visualized thyroid and chest wall soft tissues unremarkable.  No acute bony abnormality.  IMPRESSION: Small bilateral pleural effusions.  Bilateral lower lobe atelectasis or consolidation.  Patchy mixed solid and ground-glass rounded densities peripherally in the lungs, most pronounced in the upper lobes, right greater than  left.  I favor these are infectious or inflammatory.  These can be followed after treatment to assure resolution.  Coronary artery disease.   Original Report Authenticated By: Rolm Baptise, M.D.   Mr Brain Wo Contrast  01/11/2013   *RADIOLOGY REPORT*  Clinical Data: Stroke.  The examination had to be discontinued prior to completion due to the the patient could not lie flat without significant coughing and risk of aspiration.  MRI HEAD WITHOUT CONTRAST  Technique:  Multiplanar, multiecho pulse sequences of the brain and surrounding structures were obtained according to  standard protocol without intravenous contrast.  Comparison: CT head without contrast 01/08/2013.  Findings: The diffusion weighted images confirm multifocal acute / subacute and likely non hemorrhagic infarcts.  There are scattered infarcts in the left frontal and parietal lobe.  There are infarcts in the occipital lobe bilaterally, right greater than left. Bilateral acute cerebellar infarcts are noted, left greater than right.  IMPRESSION:  1.  Bilateral supratentorial and cerebellar acute / subacute infarcts are confirmed. 2.  Only diffusion weighted imaging was obtained as the patient was unable tolerate further exam due to difficulty lying flat with cough and risk for aspiration.These results were called by telephone on 01/11/2013 at 06:45 p.m. to Dr. Conception Chancy, who verbally acknowledged these results.   Original Report Authenticated By: San Morelle, M.D.   Dg Chest Port 1 View  01/18/2013   *RADIOLOGY REPORT*  Clinical Data: Pulmonary infiltrates  PORTABLE CHEST - 1 VIEW  Comparison: 01/15/2013; 01/11/2013; chest CT - 01/15/2013  Findings: Grossly unchanged cardiac silhouette and mediastinal contours with persistent mild elevation of the right hemidiaphragm. Interval removal of support apparatus.  No pneumothorax.  A small/trace right-sided pleural effusion is grossly unchanged. Grossly unchanged right perihilar and basilar heterogeneous  opacities.  No new focal airspace opacity.  No definite evidence of edema.  No pneumothorax.  Unchanged bones.  IMPRESSION: Grossly unchanged small/trace right-sided effusion and associated right perihilar and basilar opacities, atelectasis versus infiltrate. Further evaluation with a PA and lateral chest radiograph may be obtained as clinically indicated.   Original Report Authenticated By: Jake Seats, MD   Dg Chest Port 1 View  01/15/2013   *RADIOLOGY REPORT*  Clinical Data: Repositioning of NG tube.  PORTABLE CHEST - 1 VIEW  Comparison: Single view of the chest 01/15/2013 1344 hours.  Findings: NG tube is in place with tip in the distal stomach near the pylorus.  Right IJ catheter again noted.  Elevation right hemidiaphragm again seen.  Right basilar atelectasis has improved. No pneumothorax.  Heart size normal.  IMPRESSION:  1.  NG tube in good position. 2.  Improved right basilar atelectasis.   Original Report Authenticated By: Orlean Patten, M.D.   Dg Chest Port 1 View  01/15/2013   *RADIOLOGY REPORT*  Clinical Data: Fever.  Recent stroke with congestion.  Evaluate for aspiration pneumonia.  Nasogastric tube exchanged, check placement.  PORTABLE CHEST - 1 VIEW  Comparison: Chest radiograph 01/11/2013.  Findings: The nasogastric tube is coiled over the pharynx.  Suggest repositioning.  A right IJ central venous catheter remains in satisfactory position, terminating at the level of the mid superior vena cava.  Mild chronic elevation of the right hemidiaphragm.  There is mild pulmonary vascular congestion.  Faint patchy opacities in the peripheral right upper lung and right lung base appear unchanged compared to 01/11/2013.  There has been some improvement aeration at the left lung base.  The right costophrenic angle is blunted, for which a right pleural effusion cannot be excluded.  No visible pleural effusion on the left by portable technique.  Negative for pneumothorax.  The visualized bowel gas  pattern is nonobstructive. No acute bony abnormality identified.  IMPRESSION:  1.  Nasogastric tube is coiled in the pharynx.  This finding was relayed to the patient's nurse, Arbie Cookey, at approximately 1:50 p.m. 01/15/2013, by the radiology technologist, Lenna Sciara. 2. Faint patchy opacities in the right lung and improving aeration of the left lung base compared to the chest radiograph of 01/11/2013.  Aspiration pneumonia on the right cannot be  excluded. 3.  Mild pulmonary vascular congestion without definite edema. 4.  Suspect small right pleural effusion.   Original Report Authenticated By: Curlene Dolphin, M.D.   Dg Chest Port 1 View  01/11/2013   *RADIOLOGY REPORT*  Clinical Data: Assess atelectasis  PORTABLE CHEST - 1 VIEW  Comparison: Prior chest x-ray 01/10/2013  Findings: Interval extubation and removal of nasogastric tube. Right IJ approach central venous catheter remains in unchanged position with the tip in the distal SVC.  Minimal change in the appearance of the lungs of the last 24 hours.  There may be slightly increased pulmonary vascular congestion now bordering on mild interstitial edema.  Otherwise, stable small bilateral layering effusions and associated bibasilar atelectasis with elevation of the right hemidiaphragm.  Cardiac and mediastinal contours are unchanged.  IMPRESSION:  1.  Slightly increased pulmonary vascular congestion now bordering on mild interstitial edema 2.  Interval extubation and removal of nasogastric tube 3.  Otherwise similar appearance of small bilateral effusions, bibasilar atelectasis and right hemidiaphragm elevation   Original Report Authenticated By: Jacqulynn Cadet, M.D.   Dg Chest Port 1 View  01/10/2013   *RADIOLOGY REPORT*  Clinical Data: 79 year old female with respiratory failure. Intubated.  PORTABLE CHEST - 1 VIEW  Comparison: 01/09/2013 and earlier.  Findings: AP portable semi upright view 0450 hours.  Stable endotracheal tube position just below the level of  clavicles. Stable right IJ central line.  Enteric tube courses to the abdomen and across midline, tip likely in the distal stomach or proximal duodenum.  Mildly improved lung volumes.  Continued patchy confluent bibasilar opacity.  Probable small pleural effusions.  No pneumothorax or pulmonary edema.  Cardiac size and mediastinal contours are within normal limits.  Overall, ventilation not significantly changed.  IMPRESSION: 1. Stable lines and tubes. 2.  No significant interval change in ventilation.   Original Report Authenticated By: Roselyn Reef, M.D.   Dg Chest Port 1 View  01/09/2013   *RADIOLOGY REPORT*  Clinical Data: Evaluate endotracheal tube placement  PORTABLE CHEST - 1 VIEW  Comparison: 01/09/2013  Findings: The endotracheal tube tip is stable in position approximately 3.5 cm above the carina.  There is a right IJ catheter with tip in the cavoatrial junction. There is a nasogastric tube which is coiled in the stomach.  Again noted are bilateral pleural effusions.  Atelectasis is noted in both lung bases.  Similar appearance of interstitial edema.  IMPRESSION:  1.  The endotracheal tube appears to be in good position. 2.  No change in pleural effusions and interstitial edema.   Original Report Authenticated By: Kerby Moors, M.D.   Dg Chest Port 1 View  01/09/2013   *RADIOLOGY REPORT*  Clinical Data: Check endotracheal tube position  PORTABLE CHEST - 1 VIEW  Comparison: None.  Findings: Endotracheal tube is not changed in position and is approximately 4 cm from carina.  NG tube extends the stomach. Right central venous line is unchanged.  Stable cardiac silhouette. Bilateral pleural effusions unchanged from prior.  No pneumothorax.  IMPRESSION:  1.  Endotracheal tube appears in good position.  2.  Stable bilateral pleural effusions.   Original Report Authenticated By: Suzy Bouchard, M.D.   Dg Chest Port 1 View  01/08/2013   *RADIOLOGY REPORT*  Clinical Data: Endotracheal tube placement.   PORTABLE CHEST - 1 VIEW  Comparison: 01/08/2013 14 22  Findings: Endotracheal tube enters the right mainstem bronchus. This needs be retracted.  Right central line tip distal superior vena cava level.  Nasogastric tube curled upon itself with the tip at the level of the antrum.  Elevated right hemidiaphragm.  Limited evaluation right lung base.  No gross pneumothorax.  Pulmonary vascular congestion.  Pleural effusions not excluded.  IMPRESSION: Endotracheal tube enters the right mainstem bronchus.  This needs be retracted.  Elevated right hemidiaphragm.  Limited evaluation right lung base.  Pulmonary vascular congestion.  Pleural effusions not excluded.  Critical Value/emergent results were called by telephone at the time of interpretation on 01/08/2013 at 6:22 p.m. to [facility] the patient's nurse, who verbally acknowledged these results.   Original Report Authenticated By: Genia Del, M.D.   Dg Chest Port 1 View  01/08/2013   *RADIOLOGY REPORT*  Clinical Data: Central line placement.  PORTABLE CHEST - 1 VIEW  Comparison: 01/07/2013.  Findings: Right internal jugular catheter placed with the tip at the level of the right atrium.  To be at the level of the distal superior vena cava, this would need to be retracted by 3.5 cm.  No gross pneumothorax.  Elevated right hemidiaphragm with limited evaluation of the right lung base.  Pulmonary vascular congestion.  Heart size top normal.  IMPRESSION: Right internal jugular catheter placed with the tip at the level of the right atrium.  To be at the level of the distal superior vena cava, this would need to be retracted by 3.5 cm.  No gross pneumothorax.  Elevated right hemidiaphragm with limited evaluation of the right lung base.  Pulmonary vascular congestion.  This is a call report.   Original Report Authenticated By: Genia Del, M.D.   Dg Chest Port 1 View  01/07/2013   *RADIOLOGY REPORT*  Clinical Data: Tachycardia, hypoxia  PORTABLE CHEST - 1 VIEW  Comparison:  01/07/2013; 09/19/2012; 04/02/2012  Findings: Grossly unchanged cardiac silhouette and mediastinal contours gave an persistently reduced lung volumes.  There is persistent mild elevation of right hemidiaphragm.  Grossly unchanged bilateral perihilar heterogeneous opacities.  No new focal airspace opacity.  No definite evidence of pulmonary edema. No definite pleural effusion or pneumothorax.  Unchanged bones.  IMPRESSION: Persistently reduced lung volumes with grossly unchanged perihilar and bibasilar opacities, atelectasis versus infiltrate.  Further evaluation with a PA and lateral chest radiograph may be obtained as clinically indicated.   Original Report Authenticated By: Jake Seats, MD   Dg Abd Portable 1v  01/17/2013   *RADIOLOGY REPORT*  Clinical Data: History of abdominal pain, constipation, hypertension, and previous cholecystectomy.  PORTABLE ABDOMEN - 1 VIEW  Comparison: 01/11/2013.  Findings: Contrast is seen within the colon and rectum.  There is diverticulosis coli.  Previous posterior fusion is seen at the level of L4-L5.  No disruption of hardware is evident.  No opaque calculi or abnormal intra-abdominal calcifications are seen.  IMPRESSION: Residual contrast is seen within colon and rectum.  Diverticulosis coli.  Previous posterior fusion at L4-L5 level with no evidence of disruption of hardware.  No acute superimposed abnormality is evident.   Original Report Authenticated By: Shanon Brow Call   Dg Abd Portable 1v  01/11/2013   *RADIOLOGY REPORT*  Clinical Data: Nasogastric tube placement.  PORTABLE ABDOMEN - 1 VIEW  Comparison: None.  Findings: Nasogastric tube is present in the mid gastric fundus. Progression of previously seen contrast which is now outlining the colon. L4-L5 PLIF.  IMPRESSION: Nasogastric tube tip in the gastric fundus.   Original Report Authenticated By: Dereck Ligas, M.D.   Dg Abd Portable 1v  01/09/2013   *RADIOLOGY REPORT*  Clinical Data: OG tube  placement.  PORTABLE  ABDOMEN - 1 VIEW  Comparison: None.  Findings: The tip of the OG tube is in the distal antrum just proximal to the pylorus. The injected contrast is in the fundus of the stomach extending into the body of the stomach as well as some contrast in the distal esophagus.  IMPRESSION: Tip of the NG tube is in the distal antrum.  Bowel gas pattern is normal.  Calcified gallstone.   Original Report Authenticated By: Lorriane Shire, M.D.   Dg Swallowing Func-speech Pathology  01/15/2013   Katherene Ponto Deblois, Bells     01/15/2013  3:14 PM Objective Swallowing Evaluation: Modified Barium Swallowing Study   Patient Details  Name: [name] [name] MRN: [id number] Date of Birth: 1933-07-07  Today's Date: 01/15/2013 Time: [id number] SLP Time Calculation (min): 27 min  Past Medical History:  Past Medical History  Diagnosis Date  . Hypertension   . Hyperlipidemia   . Palpitations   . Thyroid disease   . Weakness generalized   . Anemia   . Other urinary problems   . Lightheadedness   . Dyspnea     w/ atypical upper airway symptoms? all vcd/lpr/gerd  . Breast pain   . Vasovagal syncope   . Cellulitis and abscess of unspecified digit   . Urinary tract infection, site not specified   . Urgency of urination   . Other symptoms involving abdomen and pelvis   . Rash and other nonspecific skin eruption   . Unspecified venous (peripheral) insufficiency   . Edema   . Osteoarthrosis, unspecified whether generalized or localized,  unspecified site   . Monoclonal paraproteinemia   . Irritable bowel syndrome   . Other B-complex deficiencies   . Unspecified hypothyroidism   . GERD (gastroesophageal reflux disease)   . Anxiety   . Torus palatinus    Past Surgical History:  Past Surgical History  Procedure Laterality Date  . Back surgery    . Shoulder surgery    . Cholecystectomy    . Abdominal hysterectomy    . Vaginosacropexy    . Suprapubic bladder suspension     HPI:  This is a 79 y/o who was admitted to [facility] with acute   pancreatitis later diagnosed to be gallstone pancreatitis. She  developed oliguric renal failure, malignant HTN at [facility].   Referred for BSE per Stroke Protocol.       Assessment / Plan / Recommendation Clinical Impression  Dysphagia Diagnosis: Mild pharyngeal phase dysphagia Clinical impression: Pt presents with a mild oral dysphagia with  slow manipulation and mastication of solid textures. Pt is  functionally able to consume soft solids, but in order to  facilitate intake, will begin with puree. There is also a mild  pharyngeal dysphagia with sensory motor defictis. Pt demosntrate  slightly sluggish, incomplete laryngeal closure with trace frank  penetration of thin liquids. Pt does not initially sense  penetration, but will clear her throat after a few seconds.  Nectar thick liquids is improved with only very trace  penetration. Recommend pt initate a Dys 1/nectar thick liquid  texture with full supervision. Will need pills crushed (pt could  nto transit in puree) and she will need to occasionally clear her  throat, which she can do on command.     Treatment Recommendation  Therapy as outlined in treatment plan below    Diet Recommendation Dysphagia 1 (Puree);Nectar-thick liquid   Liquid Administration via: Cup;Straw Medication Administration: Crushed with puree Supervision: Full supervision/cueing for compensatory  strategies Compensations: Slow rate;Small sips/bites;Clear throat  intermittently Postural Changes and/or Swallow Maneuvers: Seated upright 90  degrees    Other  Recommendations Oral Care Recommendations: Oral care BID Other Recommendations: Order thickener from pharmacy   Follow Up Recommendations  Inpatient Rehab    Frequency and Duration min 2x/week  2 weeks   Pertinent Vitals/Pain NA    SLP Swallow Goals Patient will utilize recommended strategies during swallow to  increase swallowing safety with: Moderate cueing Swallow Study Goal #2 - Progress: Progressing toward goal   General HPI: This is  a 79 y/o who was admitted to [facility] with acute pancreatitis later diagnosed to be gallstone  pancreatitis. She developed oliguric renal failure, malignant HTN  at [facility].  Referred for BSE per Stroke Protocol.   Type of Study: Modified Barium Swallowing Study Reason for Referral: Objectively evaluate swallowing function Diet Prior to this Study: NPO;Panda Temperature Spikes Noted: No Respiratory Status: Supplemental O2 delivered via (comment) History of Recent Intubation: Yes Length of Intubations (days): 1 days Date extubated: 01/10/13 Behavior/Cognition: Alert;Requires cueing Oral Cavity - Dentition: Adequate natural dentition Oral Motor / Sensory Function: Impaired - see Bedside swallow  eval Self-Feeding Abilities: Needs assist Patient Positioning: Upright in chair Baseline Vocal Quality: Clear Volitional Cough: Strong Volitional Swallow: Able to elicit Anatomy: Within functional limits Pharyngeal Secretions: Not observed secondary MBS    Reason for Referral Objectively evaluate swallowing function   Oral Phase Oral Preparation/Oral Phase Oral Phase: Impaired Oral - Solids Oral - Puree: Within functional limits Oral - Mechanical Soft: Delayed oral transit (slow manipulation  of bolus for mastication)   Pharyngeal Phase Pharyngeal Phase Pharyngeal Phase: Impaired Pharyngeal - Nectar Pharyngeal - Nectar Cup: Reduced airway/laryngeal  closure;Penetration/Aspiration during swallow Penetration/Aspiration details (nectar cup): Material enters  airway, remains ABOVE vocal cords then ejected out;Material does  not enter airway Pharyngeal - Nectar Straw: Reduced airway/laryngeal  closure;Penetration/Aspiration during swallow Penetration/Aspiration details (nectar straw): Material enters  airway, remains ABOVE vocal cords then ejected out;Material does  not enter airway Pharyngeal - Thin Pharyngeal - Thin Cup: Reduced airway/laryngeal  closure;Penetration/Aspiration during swallow;Trace aspiration  Penetration/Aspiration details (thin cup): Material enters  airway, CONTACTS cords and not ejected out;Material enters  airway, CONTACTS cords then ejected out;Material does not enter  airway Pharyngeal - Thin Straw: Reduced airway/laryngeal  closure;Penetration/Aspiration during swallow;Trace aspiration Penetration/Aspiration details (thin straw): Material enters  airway, CONTACTS cords and not ejected out;Material enters  airway, CONTACTS cords then ejected out;Material does not enter  airway Pharyngeal - Solids Pharyngeal - Puree: Within functional limits Pharyngeal - Mechanical Soft: Within functional limits Pharyngeal - Pill: Not tested (pt could not orall y transit)  Cervical Esophageal Phase    GO             Herbie Baltimore, MA CCC-SLP [phone]  DeBlois, Katherene Ponto 01/15/2013, 3:13 PM     Microbiology: Recent Results (from the past 240 hour(s))  CULTURE, BLOOD (ROUTINE X 2)     Status: None   Collection Time    01/15/13  4:30 PM      Result Value Range Status   Specimen Description BLOOD HAND RIGHT   Final   Special Requests BOTTLES DRAWN AEROBIC AND ANAEROBIC 10CC   Final   Culture  Setup Time 01/16/2013 00:44   Final   Culture NO GROWTH 5 DAYS   Final   Report Status 01/22/2013 FINAL   Final  CULTURE, BLOOD (ROUTINE X 2)     Status: None   Collection  Time    01/15/13  4:35 PM      Result Value Range Status   Specimen Description BLOOD CENTRAL LINE   Final   Special Requests     Final   Value: BOTTLES DRAWN AEROBIC AND ANAEROBIC 10CC AER 5CC ANA   Culture  Setup Time 01/16/2013 00:44   Final   Culture NO GROWTH 5 DAYS   Final   Report Status 01/22/2013 FINAL   Final  CLOSTRIDIUM DIFFICILE BY PCR     Status: None   Collection Time    01/20/13  5:41 PM      Result Value Range Status   C difficile by pcr NEGATIVE  NEGATIVE Final     Labs: Basic Metabolic Panel:  Recent Labs Lab 01/17/13 0330 01/20/13 0558 01/21/13 0615 01/22/13 0625  NA 145 139 138 135  K 3.9 3.5 3.9  3.9  CL 110 107 107 105  CO2 24 19 18* 18*  GLUCOSE 106* 120* 108* 109*  BUN 25* 14 14 13   CREATININE 1.02 0.99 0.94 1.04  CALCIUM 7.9* 7.2* 7.4* 7.7*   Liver Function Tests:  Recent Labs Lab 01/20/13 0558  AST 27  ALT 24  ALKPHOS 63  BILITOT 0.2*  PROT 5.4*  ALBUMIN 1.5*    Recent Labs Lab 01/21/13 1321  LIPASE 42   No results found for this basename: AMMONIA,  in the last 168 hours CBC:  Recent Labs Lab 01/17/13 0330 01/20/13 0558 01/21/13 0615 01/22/13 0625  WBC 10.6* 9.9 9.6 8.9  HGB 7.8* 7.2* 8.6* 8.9*  HCT 23.8* 21.2* 25.5* 26.5*  MCV 91.9 89.5 89.5 90.4  PLT 435* 512* 534* 505*   Cardiac Enzymes:  Recent Labs Lab 01/21/13 1321 01/21/13 1648 01/21/13 2340  TROPONINI <0.30 <0.30 <0.30   BNP: BNP (last 3 results) No results found for this basename: PROBNP,  in the last 8760 hours CBG:  Recent Labs Lab 01/16/13 1655 01/16/13 1931 01/16/13 2358 01/17/13 0320 01/17/13 0831  GLUCAP 107* 101* 109* 104* 106*       Signed:  Jayel Inks  [facility] 01/23/2013, 8:54 AM

## 2013-02-01 ENCOUNTER — Other Ambulatory Visit: Payer: Self-pay | Admitting: Neurology

## 2013-02-01 ENCOUNTER — Telehealth: Payer: Self-pay | Admitting: *Deleted

## 2013-02-01 DIAGNOSIS — I4891 Unspecified atrial fibrillation: Secondary | ICD-10-CM

## 2013-02-01 DIAGNOSIS — I6350 Cerebral infarction due to unspecified occlusion or stenosis of unspecified cerebral artery: Secondary | ICD-10-CM

## 2013-02-01 NOTE — Telephone Encounter (Signed)
[facility] calling and needing order for prolonged cardiac event monitor, to rule out a fib.

## 2013-02-07 ENCOUNTER — Other Ambulatory Visit: Payer: Self-pay | Admitting: Neurology

## 2013-02-07 DIAGNOSIS — I4891 Unspecified atrial fibrillation: Secondary | ICD-10-CM

## 2013-02-07 NOTE — Telephone Encounter (Signed)
Ok will order

## 2013-02-07 NOTE — Addendum Note (Signed)
Addended byOliver Hum on: 02/07/2013 10:01 AM   Modules accepted: Orders

## 2013-02-18 ENCOUNTER — Ambulatory Visit (INDEPENDENT_AMBULATORY_CARE_PROVIDER_SITE_OTHER): Payer: Medicare Other

## 2013-02-18 DIAGNOSIS — I4891 Unspecified atrial fibrillation: Secondary | ICD-10-CM

## 2013-02-18 DIAGNOSIS — I6350 Cerebral infarction due to unspecified occlusion or stenosis of unspecified cerebral artery: Secondary | ICD-10-CM

## 2013-02-18 NOTE — Progress Notes (Signed)
Enrolled patient in ecardio for montior to be mailed to [facility] in [place]

## 2013-02-20 ENCOUNTER — Telehealth: Payer: Self-pay | Admitting: *Deleted

## 2013-02-20 NOTE — Telephone Encounter (Signed)
I returned her call about they received a cardiac event monitor for this pt at [facility].  I told her , Vickii Chafe who is nurse for pt today.  That this was ordered by Dr. Leonie Man and pt to wear for 3 wks.  The monitor itself is sent by instruction fro [facility] , and I gave her this number.  [id number] if she had questions that were not answered in the box that it came in.  She is to call back if questions.

## 2013-03-07 NOTE — Clinical Social Work Placement (Signed)
Clinical Social Work Department CLINICAL SOCIAL WORK PLACEMENT NOTE 03/07/2013  Patient:  JORAH, FURSE  Account Number:  [id number] Admit date:  01/07/2013  Discharge date: 01/23/13  Clinical Social Worker:  Kajuan Guyton Givens, LCSW  Date/time:  03/07/2013 07:38 AM  Clinical Social Work is seeking post-discharge placement for this patient at the following level of care:   [employer]   (*CSW will update this form in Epic as items are completed)   01/15/2013  Patient/family provided with [facility] Department of Clinical Social Work's list of facilities offering this level of care within the geographic area requested by the patient (or if unable, by the patient's family).  01/15/2013  Patient/family informed of their freedom to choose among providers that offer the needed level of care, that participate in Medicare, Medicaid or managed care program needed by the patient, have an available bed and are willing to accept the patient.    Patient/family informed of [facility]' ownership interest in [facility], as well as of the fact that they are under no obligation to receive care at this facility.  PASARR submitted to EDS on 01/17/13 PASARR number received from EDS on 01/17/13 [id number]   FL2 transmitted to all facilities in geographic area requested by pt/family on  01/17/2013 FL2 transmitted to all facilities within larger geographic area on   Patient informed that his/her managed care company has contracts with or will negotiate with  certain facilities, including the following:     Patient/family informed of bed offers received:   Patient chooses bed at [facility] recommends and patient chooses bed at    Patient to be transferred to [facility] on 01/23/2013 Patient to be transferred to facility by ambulance  The following physician request were entered in Epic:   Additional Comments:

## 2013-03-14 ENCOUNTER — Telehealth: Payer: Self-pay | Admitting: *Deleted

## 2013-03-14 NOTE — Telephone Encounter (Signed)
Ryan, physical therapist, called requesting order for pt's physical therapy be continued at [facility].  Please advise

## 2013-03-15 NOTE — Telephone Encounter (Signed)
Ok Thx 

## 2013-03-15 NOTE — Telephone Encounter (Signed)
Please fax order for PT at [facility] to [phone].  Physical Therapy, Occupational Therapy and Speech Language Pathology with ICD-9 code.

## 2013-03-18 ENCOUNTER — Encounter (HOSPITAL_COMMUNITY): Payer: Self-pay | Admitting: *Deleted

## 2013-03-18 ENCOUNTER — Inpatient Hospital Stay (HOSPITAL_COMMUNITY)
Admission: EM | Admit: 2013-03-18 | Discharge: 2013-03-21 | DRG: 392 | Disposition: A | Discharge status: Home / Self Care | Payer: Medicare Other | Attending: Internal Medicine | Admitting: Internal Medicine

## 2013-03-18 ENCOUNTER — Encounter: Payer: Self-pay | Admitting: Internal Medicine

## 2013-03-18 ENCOUNTER — Ambulatory Visit (INDEPENDENT_AMBULATORY_CARE_PROVIDER_SITE_OTHER)
Admission: RE | Admit: 2013-03-18 | Discharge: 2013-03-18 | Disposition: A | Discharge status: Home / Self Care | Payer: Medicare Other | Source: Ambulatory Visit | Attending: Internal Medicine | Admitting: Internal Medicine

## 2013-03-18 ENCOUNTER — Other Ambulatory Visit: Payer: Self-pay | Admitting: Internal Medicine

## 2013-03-18 ENCOUNTER — Ambulatory Visit (INDEPENDENT_AMBULATORY_CARE_PROVIDER_SITE_OTHER): Payer: Medicare Other | Admitting: Internal Medicine

## 2013-03-18 ENCOUNTER — Other Ambulatory Visit (INDEPENDENT_AMBULATORY_CARE_PROVIDER_SITE_OTHER): Payer: Medicare Other

## 2013-03-18 VITALS — BP 128/62 | HR 70 | Temp 98.0°F | Ht 63.0 in | Wt 139.0 lb

## 2013-03-18 DIAGNOSIS — R0602 Shortness of breath: Secondary | ICD-10-CM

## 2013-03-18 DIAGNOSIS — R609 Edema, unspecified: Secondary | ICD-10-CM

## 2013-03-18 DIAGNOSIS — K8689 Other specified diseases of pancreas: Secondary | ICD-10-CM

## 2013-03-18 DIAGNOSIS — D638 Anemia in other chronic diseases classified elsewhere: Secondary | ICD-10-CM | POA: Diagnosis present

## 2013-03-18 DIAGNOSIS — N390 Urinary tract infection, site not specified: Secondary | ICD-10-CM

## 2013-03-18 DIAGNOSIS — R198 Other specified symptoms and signs involving the digestive system and abdomen: Secondary | ICD-10-CM

## 2013-03-18 DIAGNOSIS — IMO0002 Reserved for concepts with insufficient information to code with codable children: Secondary | ICD-10-CM

## 2013-03-18 DIAGNOSIS — J189 Pneumonia, unspecified organism: Secondary | ICD-10-CM

## 2013-03-18 DIAGNOSIS — J9600 Acute respiratory failure, unspecified whether with hypoxia or hypercapnia: Secondary | ICD-10-CM

## 2013-03-18 DIAGNOSIS — R002 Palpitations: Secondary | ICD-10-CM

## 2013-03-18 DIAGNOSIS — N644 Mastodynia: Secondary | ICD-10-CM

## 2013-03-18 DIAGNOSIS — K219 Gastro-esophageal reflux disease without esophagitis: Secondary | ICD-10-CM | POA: Diagnosis present

## 2013-03-18 DIAGNOSIS — R935 Abnormal findings on diagnostic imaging of other abdominal regions, including retroperitoneum: Secondary | ICD-10-CM

## 2013-03-18 DIAGNOSIS — R1013 Epigastric pain: Secondary | ICD-10-CM

## 2013-03-18 DIAGNOSIS — I422 Other hypertrophic cardiomyopathy: Secondary | ICD-10-CM

## 2013-03-18 DIAGNOSIS — R030 Elevated blood-pressure reading, without diagnosis of hypertension: Secondary | ICD-10-CM

## 2013-03-18 DIAGNOSIS — N289 Disorder of kidney and ureter, unspecified: Secondary | ICD-10-CM

## 2013-03-18 DIAGNOSIS — R933 Abnormal findings on diagnostic imaging of other parts of digestive tract: Secondary | ICD-10-CM

## 2013-03-18 DIAGNOSIS — I872 Venous insufficiency (chronic) (peripheral): Secondary | ICD-10-CM

## 2013-03-18 DIAGNOSIS — K859 Acute pancreatitis without necrosis or infection, unspecified: Secondary | ICD-10-CM

## 2013-03-18 DIAGNOSIS — R109 Unspecified abdominal pain: Secondary | ICD-10-CM

## 2013-03-18 DIAGNOSIS — G9340 Encephalopathy, unspecified: Secondary | ICD-10-CM

## 2013-03-18 DIAGNOSIS — D472 Monoclonal gammopathy: Secondary | ICD-10-CM

## 2013-03-18 DIAGNOSIS — I10 Essential (primary) hypertension: Secondary | ICD-10-CM | POA: Diagnosis present

## 2013-03-18 DIAGNOSIS — R3915 Urgency of urination: Secondary | ICD-10-CM

## 2013-03-18 DIAGNOSIS — Z8719 Personal history of other diseases of the digestive system: Secondary | ICD-10-CM

## 2013-03-18 DIAGNOSIS — R55 Syncope and collapse: Secondary | ICD-10-CM

## 2013-03-18 DIAGNOSIS — E785 Hyperlipidemia, unspecified: Secondary | ICD-10-CM | POA: Diagnosis present

## 2013-03-18 DIAGNOSIS — F411 Generalized anxiety disorder: Secondary | ICD-10-CM

## 2013-03-18 DIAGNOSIS — K8020 Calculus of gallbladder without cholecystitis without obstruction: Secondary | ICD-10-CM | POA: Diagnosis present

## 2013-03-18 DIAGNOSIS — D518 Other vitamin B12 deficiency anemias: Secondary | ICD-10-CM | POA: Diagnosis present

## 2013-03-18 DIAGNOSIS — Z8673 Personal history of transient ischemic attack (TIA), and cerebral infarction without residual deficits: Secondary | ICD-10-CM

## 2013-03-18 DIAGNOSIS — E538 Deficiency of other specified B group vitamins: Secondary | ICD-10-CM

## 2013-03-18 DIAGNOSIS — R1310 Dysphagia, unspecified: Secondary | ICD-10-CM

## 2013-03-18 DIAGNOSIS — D649 Anemia, unspecified: Secondary | ICD-10-CM | POA: Diagnosis present

## 2013-03-18 DIAGNOSIS — E039 Hypothyroidism, unspecified: Secondary | ICD-10-CM | POA: Diagnosis present

## 2013-03-18 DIAGNOSIS — K589 Irritable bowel syndrome without diarrhea: Secondary | ICD-10-CM

## 2013-03-18 DIAGNOSIS — N179 Acute kidney failure, unspecified: Secondary | ICD-10-CM | POA: Diagnosis present

## 2013-03-18 DIAGNOSIS — F341 Dysthymic disorder: Secondary | ICD-10-CM

## 2013-03-18 DIAGNOSIS — I639 Cerebral infarction, unspecified: Secondary | ICD-10-CM

## 2013-03-18 DIAGNOSIS — M899 Disorder of bone, unspecified: Secondary | ICD-10-CM

## 2013-03-18 DIAGNOSIS — R21 Rash and other nonspecific skin eruption: Secondary | ICD-10-CM

## 2013-03-18 DIAGNOSIS — D3701 Neoplasm of uncertain behavior of lip: Secondary | ICD-10-CM

## 2013-03-18 DIAGNOSIS — M1990 Unspecified osteoarthritis, unspecified site: Secondary | ICD-10-CM

## 2013-03-18 HISTORY — DX: Cerebral infarction, unspecified: I63.9

## 2013-03-18 HISTORY — DX: Disorder of kidney and ureter, unspecified: N28.9

## 2013-03-18 HISTORY — DX: Acute pancreatitis without necrosis or infection, unspecified: K85.90

## 2013-03-18 LAB — COMPREHENSIVE METABOLIC PANEL
ALT: 14 U/L (ref 0–35)
ALT: 12 U/L (ref 0–35)
AST: 20 U/L (ref 0–37)
AST: 17 U/L (ref 0–37)
Albumin: 3.5 g/dL (ref 3.5–5.2)
CO2: 25 mEq/L (ref 19–32)
CO2: 26 mEq/L (ref 19–32)
Calcium: 9.3 mg/dL (ref 8.4–10.5)
Chloride: 103 mEq/L (ref 96–112)
Creatinine, Ser: 1.7 mg/dL — ABNORMAL HIGH (ref 0.4–1.2)
GFR: 31.94 mL/min — ABNORMAL LOW (ref >60.00)
Sodium: 137 mEq/L (ref 135–145)
Sodium: 135 mEq/L (ref 135–145)
Total Bilirubin: 0.5 mg/dL (ref 0.3–1.2)
Total Protein: 7.6 g/dL (ref 6.0–8.3)

## 2013-03-18 LAB — CBC WITH DIFFERENTIAL/PLATELET
Basophils Absolute: 0 10*3/uL (ref 0.0–0.1)
Eosinophils Absolute: 0.3 10*3/uL (ref 0.0–0.7)
Eosinophils Relative: 6 % — ABNORMAL HIGH (ref 0–5)
Lymphocytes Relative: 41 % (ref 12–46)
MCV: 91.4 fL (ref 78.0–100.0)
Platelets: 219 10*3/uL (ref 150–400)
RDW: 14.6 % (ref 11.5–15.5)
WBC: 5 10*3/uL (ref 4.0–10.5)

## 2013-03-18 LAB — CBC
Hemoglobin: 10.2 g/dL — ABNORMAL LOW (ref 12.0–15.0)
MCHC: 34.2 g/dL (ref 30.0–36.0)
RDW: 15 % — ABNORMAL HIGH (ref 11.5–14.6)
WBC: 6.4 10*3/uL (ref 4.5–10.5)

## 2013-03-18 LAB — POCT I-STAT TROPONIN I: Troponin i, poc: 0.01 ng/mL (ref 0.00–0.08)

## 2013-03-18 LAB — LIPASE: Lipase: 29 U/L (ref 11.0–59.0)

## 2013-03-18 LAB — AMYLASE: Amylase: 49 U/L (ref 27–131)

## 2013-03-18 MED ORDER — MORPHINE SULFATE 4 MG/ML IJ SOLN
4.0000 mg | Freq: Once | INTRAMUSCULAR | Status: DC
  Filled 2013-03-18: qty 1

## 2013-03-18 MED ORDER — ONDANSETRON HCL 4 MG PO TABS: 4.0000 mg | ORAL_TABLET | Freq: Four times a day (QID) | ORAL | Status: DC | PRN

## 2013-03-18 MED ORDER — FOLIC ACID 400 MCG PO TABS: 400.0000 ug | ORAL_TABLET | Freq: Every day | ORAL | Status: DC

## 2013-03-18 MED ORDER — CYANOCOBALAMIN 1000 MCG/ML IJ SOLN: 1000.0000 ug | INTRAMUSCULAR | Status: DC

## 2013-03-18 MED ORDER — ACETAMINOPHEN 650 MG RE SUPP: 650.0000 mg | Freq: Four times a day (QID) | RECTAL | Status: DC | PRN

## 2013-03-18 MED ORDER — SODIUM CHLORIDE 0.9 % IV BOLUS (SEPSIS)
500.0000 mL | Freq: Once | INTRAVENOUS | Status: AC
  Administered 2013-03-18: 500 mL via INTRAVENOUS

## 2013-03-18 MED ORDER — LABETALOL HCL 100 MG PO TABS
100.0000 mg | ORAL_TABLET | Freq: Two times a day (BID) | ORAL | Status: DC
  Administered 2013-03-19 – 2013-03-21 (×6): 100 mg via ORAL
  Filled 2013-03-18 (×7): qty 1

## 2013-03-18 MED ORDER — MORPHINE SULFATE 2 MG/ML IJ SOLN: 1.0000 mg | INTRAMUSCULAR | Status: DC | PRN

## 2013-03-18 MED ORDER — ONDANSETRON HCL 4 MG/2ML IJ SOLN
4.0000 mg | Freq: Four times a day (QID) | INTRAMUSCULAR | Status: DC | PRN
  Administered 2013-03-19: 4 mg via INTRAVENOUS
  Filled 2013-03-18: qty 2

## 2013-03-18 MED ORDER — KETOROLAC TROMETHAMINE 30 MG/ML IJ SOLN
30.0000 mg | Freq: Once | INTRAMUSCULAR | Status: AC
  Administered 2013-03-18: 30 mg via INTRAMUSCULAR

## 2013-03-18 MED ORDER — ACETAMINOPHEN 325 MG PO TABS: 650.0000 mg | ORAL_TABLET | Freq: Four times a day (QID) | ORAL | Status: DC | PRN

## 2013-03-18 MED ORDER — ZOLPIDEM TARTRATE 5 MG PO TABS: 5.0000 mg | ORAL_TABLET | Freq: Every evening | ORAL | Status: DC | PRN

## 2013-03-18 MED ORDER — LEVOTHYROXINE SODIUM 88 MCG PO TABS
88.0000 ug | ORAL_TABLET | Freq: Every day | ORAL | Status: DC
  Administered 2013-03-19 – 2013-03-21 (×3): 88 ug via ORAL
  Filled 2013-03-18 (×5): qty 1

## 2013-03-18 MED ORDER — PANTOPRAZOLE SODIUM 40 MG PO TBEC
80.0000 mg | DELAYED_RELEASE_TABLET | Freq: Every day | ORAL | Status: DC
  Administered 2013-03-19 – 2013-03-21 (×3): 80 mg via ORAL
  Filled 2013-03-18: qty 1
  Filled 2013-03-18: qty 2

## 2013-03-18 MED ORDER — STARCH (THICKENING) PO POWD
ORAL | Status: DC | PRN
  Filled 2013-03-18: qty 227

## 2013-03-18 MED ORDER — VITAMIN D3 25 MCG (1000 UNIT) PO TABS
1000.0000 [IU] | ORAL_TABLET | Freq: Every day | ORAL | Status: DC
  Administered 2013-03-19 – 2013-03-21 (×3): 1000 [IU] via ORAL
  Filled 2013-03-18 (×3): qty 1

## 2013-03-18 MED ORDER — SODIUM CHLORIDE 0.9 % IV SOLN
INTRAVENOUS | Status: DC
  Administered 2013-03-19 (×2): via INTRAVENOUS

## 2013-03-18 MED ORDER — ASPIRIN 325 MG PO TABS
325.0000 mg | ORAL_TABLET | Freq: Every day | ORAL | Status: DC
  Administered 2013-03-19 – 2013-03-20 (×2): 325 mg via ORAL
  Filled 2013-03-18 (×2): qty 1

## 2013-03-18 MED ORDER — ENOXAPARIN SODIUM 30 MG/0.3ML ~~LOC~~ SOLN
30.0000 mg | SUBCUTANEOUS | Status: DC
  Administered 2013-03-19 – 2013-03-21 (×3): 30 mg via SUBCUTANEOUS
  Filled 2013-03-18 (×3): qty 0.3

## 2013-03-18 NOTE — ED Provider Notes (Signed)
History    CSN: [id number] Arrival date & time 03/18/13  1737  First MD Initiated Contact with Patient 03/18/13 2220     Chief Complaint  Patient presents with  . Abdominal Pain   (Consider location/radiation/quality/duration/timing/severity/associated sxs/prior Treatment) Patient is a 79 y.o. female presenting with abdominal pain. The history is provided by the patient.  Abdominal Pain Associated symptoms include abdominal pain. Pertinent negatives include no chest pain, no headaches and no shortness of breath.   patient had pancreatitis presumably from gallstones 2 months ago. She had pain return a day or 2 ago. It is on the left side. She's had decreased oral intake. She states she has just been eating less and doesn't know if the pain is worse with eating. No fevers. She was seen by her primary care doctor had outpatient CT done that shows pancreatic thickening and possible pseudocyst. She was sent to the ER for further evaluation and treatment. The pain is constant in her upper abdomen. Past Medical History  Diagnosis Date  . Hypertension   . Hyperlipidemia   . Palpitations   . Thyroid disease   . Weakness generalized   . Anemia   . Other urinary problems   . Lightheadedness   . Dyspnea     w/ atypical upper airway symptoms? all vcd/lpr/gerd  . Breast pain   . Vasovagal syncope   . Cellulitis and abscess of unspecified digit   . Urinary tract infection, site not specified   . Urgency of urination   . Other symptoms involving abdomen and pelvis(789.9)   . Rash and other nonspecific skin eruption   . Unspecified venous (peripheral) insufficiency   . Edema   . Osteoarthrosis, unspecified whether generalized or localized, unspecified site   . Monoclonal paraproteinemia   . Irritable bowel syndrome   . Other B-complex deficiencies   . Unspecified hypothyroidism   . GERD (gastroesophageal reflux disease)   . Anxiety   . Torus palatinus   . Stroke   . Pancreatitis     Past Surgical History  Procedure Laterality Date  . Back surgery    . Shoulder surgery    . Cholecystectomy    . Abdominal hysterectomy    . Vaginosacropexy    . Suprapubic bladder suspension    . Tee without cardioversion N/A 01/16/2013    Procedure: TRANSESOPHAGEAL ECHOCARDIOGRAM (TEE);  Surgeon: Josue Hector, MD;  Location: [facility] ENDOSCOPY;  Service: Cardiovascular;  Laterality: N/A;   Family History  Problem Relation Age of Onset  . Hypertension Other   . Hypertension Father    History  Substance Use Topics  . Smoking status: Never Smoker   . Smokeless tobacco: Not on file  . Alcohol Use: No   OB History   Grav Para Term Preterm Abortions TAB SAB Ect Mult Living                 Review of Systems  Constitutional: Positive for appetite change. Negative for activity change.  HENT: Negative for neck stiffness.   Eyes: Negative for pain.  Respiratory: Negative for chest tightness and shortness of breath.   Cardiovascular: Negative for chest pain and leg swelling.  Gastrointestinal: Positive for abdominal pain. Negative for nausea, vomiting and diarrhea.  Genitourinary: Negative for flank pain.  Musculoskeletal: Negative for back pain.  Skin: Negative for rash.  Neurological: Negative for weakness, numbness and headaches.  Psychiatric/Behavioral: Negative for behavioral problems.    Allergies  Amlodipine besylate; Cefuroxime axetil; Nitrofurantoin; and Sulfadiazine  Home Medications   Current Outpatient Rx  Name  Route  Sig  Dispense  Refill  . aspirin 325 MG tablet   Oral   Take 1 tablet (325 mg total) by mouth daily.         . cholecalciferol (VITAMIN D) 1000 UNITS tablet   Oral   Take 1,000 Units by mouth daily.           . cyanocobalamin (,VITAMIN B-12,) 1000 MCG/ML injection   Subcutaneous   Inject 1 mL (1,000 mcg total) into the skin every 14 (fourteen) days.   30 mL   1   . folic acid (FOLVITE) A999333 MCG tablet   Oral   Take 400 mcg by mouth  daily.         Marland Kitchen labetalol (NORMODYNE) 100 MG tablet   Oral   Take 100 mg by mouth 2 (two) times daily.         Marland Kitchen levothyroxine (SYNTHROID, LEVOTHROID) 88 MCG tablet   Oral   Take 88 mcg by mouth daily before breakfast.         . omeprazole (PRILOSEC) 40 MG capsule   Oral   Take 1 capsule (40 mg total) by mouth daily.   90 capsule   3   . zaleplon (SONATA) 10 MG capsule   Oral   Take 10 mg by mouth at bedtime.         . food thickener (THICK IT) POWD      Thick all liquids      0    BP 186/72  Pulse 80  Temp(Src) 97.1 F (36.2 C) (Oral)  Resp 18  SpO2 98% Physical Exam  Nursing note and vitals reviewed. Constitutional: She is oriented to person, place, and time. She appears well-developed and well-nourished.  HENT:  Head: Normocephalic and atraumatic.  Eyes: EOM are normal. Pupils are equal, round, and reactive to light.  Neck: Normal range of motion. Neck supple.  Cardiovascular: Normal rate, regular rhythm and normal heart sounds.   No murmur heard. Pulmonary/Chest: Effort normal and breath sounds normal. No respiratory distress. She has no wheezes. She has no rales.  Abdominal: Soft. Bowel sounds are normal. She exhibits no distension. There is tenderness. There is no rebound and no guarding.  Moderate tenderness to left upper abdomen.  Musculoskeletal: Normal range of motion.  Neurological: She is alert and oriented to person, place, and time. No cranial nerve deficit.  Skin: Skin is warm and dry.  Psychiatric: She has a normal mood and affect. Her speech is normal.    ED Course  Procedures (including critical care time) Labs Reviewed  CBC WITH DIFFERENTIAL - Abnormal; Notable for the following:    RBC 3.14 (*)    Hemoglobin 9.7 (*)    HCT 28.7 (*)    Eosinophils Relative 6 (*)    All other components within normal limits  COMPREHENSIVE METABOLIC PANEL - Abnormal; Notable for the following:    Glucose, Bld 131 (*)    Creatinine, Ser 1.73 (*)     Albumin 3.2 (*)    GFR calc non Af Amer 27 (*)    GFR calc Af Amer 31 (*)    All other components within normal limits  LIPASE, BLOOD  URINALYSIS, ROUTINE W REFLEX MICROSCOPIC  POCT I-STAT TROPONIN I   Ct Abdomen Pelvis Wo Contrast  03/18/2013   *RADIOLOGY REPORT*  Clinical Data: Mid abdominal pain.  History of pancreatitis.  CT ABDOMEN AND PELVIS WITHOUT CONTRAST  Technique:  Multidetector CT imaging of the abdomen and pelvis was performed following the standard protocol without intravenous contrast.  Comparison: Abdominal pelvic CT 03/01/2010 and 01/06/2013.  Findings: There is stable left basilar and lingular scarring.  No significant pleural or pericardial effusion is present.  There is a small hiatal hernia.  An 11 mm calcified gallstone is again noted.  There is no gallbladder wall thickening or biliary dilatation.  The liver, spleen, adrenal glands and kidneys demonstrate no significant findings.  There has been interval development of marked diffuse enlargement of the pancreas, measuring up to 10.0 x 3.8 cm transverse.  This enlargement primarily involves the pancreatic body and tail and is associated with multiple new irregular nodular soft tissue densities in the surrounding retroperitoneal fat, extending inferiorly along the left paracolic gutter and base of the mesentery.  The soft tissue densities appear nodular rather than tubular on this noncontrasted study. There is a dominant 1.9 cm nodular density within the left upper quadrant omentum on image number 17.  There is no generalized ascites.  The stomach and small bowel appear normal.  The appendix is not visualized.  There are diverticular changes of the sigmoid colon without surrounding inflammation.  The uterus is surgically absent. There is no adnexal mass.  The bladder is nearly empty and suboptimally evaluated.  There is mild aorto iliac atherosclerosis.  Postsurgical changes are present within the lumbar spine status post  laminectomy and PLIF at L4-L5.  There are no acute osseous findings.  IMPRESSION:  1.  Interval development of extensive retroperitoneal nodularity asymmetric to the left, concerning for peritoneal carcinomatosis. There is no significant associated ascites.  Because these densities appear nodular rather than tubular, collateral vessel formation is considered less likely. The splenic vein was patent on the prior CT performed with contrast. 2.  Interval diffuse enlargement of the pancreatic body and tail, likely pancreatic necrosis and developing pseudocyst formation given the short interval since the prior study. Underlying pancreatic malignancy cannot be excluded.  MRI may be helpful for further evaluation, especially if the patient is able to receive contrast (the patient has renal insufficiency), prior to tissue sampling.  These results will be called to the ordering clinician or representative by the Radiologist Assistant, and communication documented in the PACS Dashboard.   Original Report Authenticated By: Richardean Sale, M.D.   1. Pain from pancreas   2. Renal insufficiency     MDM  Patient with abdominal pain. CT shows pancreatic necrosis and likely pseudocyst. Also is possible malignancy. She has bumped her creatinine. She'll be admitted to internal medicine for further pain control and evaluation  Jasper Riling. Alvino Chapel, MD 03/18/13 [phone]

## 2013-03-18 NOTE — Patient Instructions (Signed)
Cholelithiasis Cholelithiasis (also called gallstones) is a form of gallbladder disease where gallstones form in your gallbladder. The gallbladder is a non-essential organ that stores bile made in the liver, which helps digest fats. Gallstones begin as small crystals and slowly grow into stones. Gallstone pain occurs when the gallbladder spasms, and a gallstone is blocking the duct. Pain can also occur when a stone passes out of the duct.  Women are more likely to develop gallstones than men. Other factors that increase the risk of gallbladder disease are:  Having multiple pregnancies. Physicians sometimes advise removing diseased gallbladders before future pregnancies.  Obesity.  Diets heavy in fried foods and fat.  Increasing age (older than 60).  Prolonged use of medications containing female hormones.  Diabetes mellitus.  Rapid weight loss.  Family history of gallstones (heredity). SYMPTOMS  Feeling sick to your stomach (nauseous).  Abdominal pain.  Yellowing of the skin (jaundice).  Sudden pain. It may persist from several minutes to several hours.  Worsening pain with deep breathing or when jarred.  Fever.  Tenderness to the touch. In some cases, when gallstones do not move into the bile duct, people have no pain or symptoms. These are called "silent" gallstones. TREATMENT In severe cases, emergency surgery may be required. HOME CARE INSTRUCTIONS   Only take over-the-counter or prescription medicines for pain, discomfort, or fever as directed by your caregiver.  Follow a low-fat diet until seen again. Fat causes the gallbladder to contract, which can result in pain.  Follow up as instructed. Attacks are almost always recurrent and surgery is usually required for permanent treatment. SEEK IMMEDIATE MEDICAL CARE IF:   Your pain increases and is not controlled by medications.  You have an oral temperature above 102 F (38.9 C), not controlled by medication.  You  develop nausea and vomiting. MAKE SURE YOU:   Understand these instructions.  Will watch your condition.  Will get help right away if you are not doing well or get worse. Document Released: 08/18/2005 Document Revised: 11/14/2011 Document Reviewed: 10/21/2010 Exit[employer] Patient Information 2014 [employer].  

## 2013-03-18 NOTE — ED Notes (Signed)
Pt sitting in wc in lobby talking with family, states that she feels worse and that she has not had anything to eat tonight. Pt informed not to eat or drink anything until evaluated by the MD.  Pt skin warm/dry VSS

## 2013-03-18 NOTE — ED Notes (Signed)
Pt had ct scan today and showed she has swollen pancreas and had to come back because she was having so much pain

## 2013-03-18 NOTE — Addendum Note (Signed)
Addended by: Rebeca Alert C on: 03/18/2013 11:00 AM   Modules accepted: Orders

## 2013-03-18 NOTE — ED Notes (Signed)
PT is here with upper abdominal pain and has history of pancreatitis.  Pt has had recent stroke.

## 2013-03-18 NOTE — Addendum Note (Signed)
Addended by: Jearld Fenton on: 03/18/2013 10:45 AM   Modules accepted: Orders

## 2013-03-18 NOTE — H&P (Signed)
Patient's PCP: Walker Kehr, MD  Chief Complaint: Epigastric pain  History of Present Illness: [name] [name] is a 79 y.o. Caucasian female with history of hypertension, hyperlipidemia, hypothyroidism, anemia, recent history of CVA in May 2014 with dysarthria and right-sided deficits, recent history of pancreatitis due to common bile duct stone which has passed, history of cholelithiasis, acute renal failure, dysphagia due to CVA, and hypertrophic cardiomyopathy who presents with the above complaints.  Patient had a prolonged hospital stay from 01/07/2013 to 01/23/2013 due to CVA, acute pancreatitis from cholelithiasis with stone passing in her common bile duct.  Hospital course was complicated by acute renal failure.  Patient also had acute respiratory failure requiring intubation and mechanical ventilation from 01/09/2013 to 01/10/2013.  Hospital course was also complicated by pneumonia.  After discharge patient went to skilled nursing facility at [facility].  She was discharged home 2 weeks ago and has been living with her daughter.  She has made good progress at skilled nursing facility and at home, she had improvement in her right side from her stroke.  Her speech and also has improved.  Yesterday morning and during the night patient had epigastric pain which was sharp.  The pain was radiating to her left upper quadrant.  Patient did not have any nausea or vomiting.  However due to her history of cholelithiasis and pancreatitis from choledocholithiasis, she called her primary care physician who did labs and CT of her abdomen pelvis without contrast.  Lab showed she was in acute renal failure with creatinine of 1.7, and CT of abdomen and pelvis without contrast showed interval development of extensive retroperitoneal nodularity concerning for peritoneal carcinomatosis versus reactive changes from recent pancreatitis.  CT also showed interval diffuse enlargement of pancreatic body and tail and likely  pancreatic necrosis with developing pseudocyst.  Hospitalist service was asked to admit the patient for further care and management.  Patient denies any recent fevers but has chills at home.  Denies any chest pain, shortness of breath, diarrhea, headaches or new vision changes.  Currently denies any abdominal pain.  Patient admits that she does not drink adequate fluids at home to hydrate herself.  Review of Systems: All systems reviewed with the patient and positive as per history of present illness, otherwise all other systems are negative.  Past Medical History  Diagnosis Date  . Hypertension   . Hyperlipidemia   . Palpitations   . Thyroid disease   . Weakness generalized   . Anemia   . Other urinary problems   . Lightheadedness   . Dyspnea     w/ atypical upper airway symptoms? all vcd/lpr/gerd  . Breast pain   . Vasovagal syncope   . Cellulitis and abscess of unspecified digit   . Urinary tract infection, site not specified   . Urgency of urination   . Other symptoms involving abdomen and pelvis(789.9)   . Rash and other nonspecific skin eruption   . Unspecified venous (peripheral) insufficiency   . Edema   . Osteoarthrosis, unspecified whether generalized or localized, unspecified site   . Monoclonal paraproteinemia   . Irritable bowel syndrome   . Other B-complex deficiencies   . Unspecified hypothyroidism   . GERD (gastroesophageal reflux disease)   . Anxiety   . Torus palatinus   . Stroke   . Pancreatitis    Past Surgical History  Procedure Laterality Date  . Back surgery    . Shoulder surgery    . Cholecystectomy    . Abdominal  hysterectomy    . Vaginosacropexy    . Suprapubic bladder suspension    . Tee without cardioversion N/A 01/16/2013    Procedure: TRANSESOPHAGEAL ECHOCARDIOGRAM (TEE);  Surgeon: Josue Hector, MD;  Location: [facility] ENDOSCOPY;  Service: Cardiovascular;  Laterality: N/A;   Family History  Problem Relation Age of Onset  . Hypertension Other    . Hypertension Father   . Heart Problems Father    History   Social History  . Marital Status: Married    Spouse Name: N/A    Number of Children: N/A  . Years of Education: N/A   Occupational History  . Not on file.   Social History Main Topics  . Smoking status: Never Smoker   . Smokeless tobacco: Not on file  . Alcohol Use: Yes     Comment: Occasionally during the night to help sleep  . Drug Use: No  . Sexually Active: Not on file   Other Topics Concern  . Not on file   Social History Narrative   Retired   Married   Never Smoked   Alcohol use- no   Allergies: Amlodipine besylate; Cefuroxime axetil; Nitrofurantoin; and Sulfadiazine  Home Meds: Prior to Admission medications   Medication Sig Start Date End Date Taking? Authorizing Provider  aspirin 325 MG tablet Take 1 tablet (325 mg total) by mouth daily. 01/23/13  Yes Sorin June Leap, MD  cholecalciferol (VITAMIN D) 1000 UNITS tablet Take 1,000 Units by mouth daily.     Yes Historical Provider, MD  cyanocobalamin (,VITAMIN B-12,) 1000 MCG/ML injection Inject 1 mL (1,000 mcg total) into the skin every 14 (fourteen) days. 12/21/12  Yes Evie Lacks Plotnikov, MD  folic acid (FOLVITE) [id number] MCG tablet Take 400 mcg by mouth daily.   Yes Historical Provider, MD  labetalol (NORMODYNE) 100 MG tablet Take 100 mg by mouth 2 (two) times daily.   Yes Historical Provider, MD  levothyroxine (SYNTHROID, LEVOTHROID) 88 MCG tablet Take 88 mcg by mouth daily before breakfast.   Yes Historical Provider, MD  omeprazole (PRILOSEC) 40 MG capsule Take 1 capsule (40 mg total) by mouth daily. 12/21/12  Yes Evie Lacks Plotnikov, MD  zaleplon (SONATA) 10 MG capsule Take 10 mg by mouth at bedtime.   Yes Historical Provider, MD  food thickener (THICK IT) POWD Thick all liquids 01/23/13   Sorin June Leap, MD    Physical Exam: Blood pressure 164/70, pulse 65, temperature 97.1 F (36.2 C), temperature source Oral, resp. rate 20, SpO2 100.00%. General: Awake,  Oriented x3, No acute distress, dysarthric. HEENT: EOMI, Moist mucous membranes Neck: Supple CV: S1 and S2 Lungs: Clear to ascultation bilaterally Abdomen: Soft, Nontender, Nondistended, +bowel sounds. Ext: Good pulses. Trace edema. No clubbing or cyanosis noted. Neuro: Cranial Nerves II-XII grossly intact, slight right droopy lip. Has 5/5 motor strength in left upper and lower extremities.  Has -5/5 motor strength in right upper and lower extremities.  Lab results:  Recent Labs  03/18/13 1033 03/18/13 1830  NA 137 135  K 4.8 4.1  CL 103 99  CO2 25 26  GLUCOSE 117* 131*  BUN 18 19  CREATININE 1.7* 1.73*  CALCIUM 9.5 9.3    Recent Labs  03/18/13 1033 03/18/13 1830  AST 20 17  ALT 14 12  ALKPHOS 52 63  BILITOT 0.5 0.3  PROT 7.9 7.6  ALBUMIN 3.5 3.2*    Recent Labs  03/18/13 1033 03/18/13 1830  LIPASE 29.0 37  AMYLASE 49  --  Recent Labs  03/18/13 1033 03/18/13 1830  WBC 6.4 5.0  NEUTROABS  --  2.2  HGB 10.2* 9.7*  HCT 29.9* 28.7*  MCV 92.8 91.4  PLT 239.0 219   No results found for this basename: CKTOTAL, CKMB, CKMBINDEX, TROPONINI,  in the last 72 hours No components found with this basename: POCBNP,  No results found for this basename: DDIMER,  in the last 72 hours No results found for this basename: HGBA1C,  in the last 72 hours No results found for this basename: CHOL, HDL, LDLCALC, TRIG, CHOLHDL, LDLDIRECT,  in the last 72 hours No results found for this basename: TSH, T4TOTAL, FREET3, T3FREE, THYROIDAB,  in the last 72 hours No results found for this basename: VITAMINB12, FOLATE, FERRITIN, TIBC, IRON, RETICCTPCT,  in the last 72 hours Imaging results:  Ct Abdomen Pelvis Wo Contrast  03/18/2013   *RADIOLOGY REPORT*  Clinical Data: Mid abdominal pain.  History of pancreatitis.  CT ABDOMEN AND PELVIS WITHOUT CONTRAST  Technique:  Multidetector CT imaging of the abdomen and pelvis was performed following the standard protocol without intravenous  contrast.  Comparison: Abdominal pelvic CT 03/01/2010 and 01/06/2013.  Findings: There is stable left basilar and lingular scarring.  No significant pleural or pericardial effusion is present.  There is a small hiatal hernia.  An 11 mm calcified gallstone is again noted.  There is no gallbladder wall thickening or biliary dilatation.  The liver, spleen, adrenal glands and kidneys demonstrate no significant findings.  There has been interval development of marked diffuse enlargement of the pancreas, measuring up to 10.0 x 3.8 cm transverse.  This enlargement primarily involves the pancreatic body and tail and is associated with multiple new irregular nodular soft tissue densities in the surrounding retroperitoneal fat, extending inferiorly along the left paracolic gutter and base of the mesentery.  The soft tissue densities appear nodular rather than tubular on this noncontrasted study. There is a dominant 1.9 cm nodular density within the left upper quadrant omentum on image number 17.  There is no generalized ascites.  The stomach and small bowel appear normal.  The appendix is not visualized.  There are diverticular changes of the sigmoid colon without surrounding inflammation.  The uterus is surgically absent. There is no adnexal mass.  The bladder is nearly empty and suboptimally evaluated.  There is mild aorto iliac atherosclerosis.  Postsurgical changes are present within the lumbar spine status post laminectomy and PLIF at L4-L5.  There are no acute osseous findings.  IMPRESSION:  1.  Interval development of extensive retroperitoneal nodularity asymmetric to the left, concerning for peritoneal carcinomatosis. There is no significant associated ascites.  Because these densities appear nodular rather than tubular, collateral vessel formation is considered less likely. The splenic vein was patent on the prior CT performed with contrast. 2.  Interval diffuse enlargement of the pancreatic body and tail, likely  pancreatic necrosis and developing pseudocyst formation given the short interval since the prior study. Underlying pancreatic malignancy cannot be excluded.  MRI may be helpful for further evaluation, especially if the patient is able to receive contrast (the patient has renal insufficiency), prior to tissue sampling.  These results will be called to the ordering clinician or representative by the Radiologist Assistant, and communication documented in the PACS Dashboard.   Original Report Authenticated By: Richardean Sale, M.D.   Assessment & Plan by Problem: Acute renal failure Etiology unclear.  Suspect is likely due to poor oral intake.  Hydrate the patient on IV fluids,  if renal function does not improve tomorrow consider further workup.  Epigastric pain with new CT changes of interval development of extensive retroperitoneal nodularity , diffuse enlargement of pancreatic body and tail with likely pancreatic necrosis and developing pseudocyst formation Lipase is normal.  Do not suspect patient to be in acute pancreatitis at this time.  Patient's liver function tests are also normal.  Etiology for epigastric pain unclear at this time.  Regarding CT changes of abdomen and pelvis, uncertain if the changes are concerning due to peritoneal carcinomatosis versus due to reactive changes from recent pancreatitis.  Patient has been evaluated by Dr. Benson Norway, gastroenterology, in May of 2014.  Will likely benefit from a GI evaluation in the morning.  Will also have the patient on clear liquid diet (dysphasia nectar) until GI evaluation.  Cholelithiasis Likely will benefit from outpatient general surgery consultation.  If any clinical change, may consider inpatient general surgery consultation.  Hypertension Continue home antihypertensive medication of labetalol.  Hypothyroidism Continue levothyroxine.  Anemia due to B12 deficiency and due to chronic disease Hemoglobin stable at this time.  Continue to  monitor.  Continue B12 injections.  GERD Continue PPI.  History of CVA in May of 2014 resulting in dysarthria and right-sided weakness Stable.  Continue aspirin.  Prophylaxis Lovenox.  May need to be discontinued if GI planning on any procedures.  CODE STATUS Full code.  This was discussed with the patient and daughters the time of admission.  Disposition Admit the patient to MedSurg as inpatient.  Time spent on admission, talking to the patient, and coordinating care was: 60 mins.  Rechel Delosreyes A, MD 03/18/2013, 11:38 PM

## 2013-03-18 NOTE — Addendum Note (Signed)
Addended by: Jearld Fenton on: 03/18/2013 04:08 PM   Modules accepted: Orders

## 2013-03-18 NOTE — Progress Notes (Addendum)
Subjective:    Patient ID: [name] [name], female    DOB: [birth year], 79 y.o.   MRN: [id number]  HPI  Pt presents to the clinic today with c/o epigastric pain. This started 2 days ago. She denies nausea or vomiting. She has had some associated lightheadedness and dizziness. She denies chest pain, chest tightness, or shortness of breath. She did take a laxative early this morning. The pain did get worse after that. It does radiate to the left side. She has not taken anything for the pain. She was recently hospitalized for acute pancreatitis secondary to gallstones. She has history of IBS and chronic abdominal pain. Last colonoscopyu in 2011 revealed extensive diverticulosis. She did have a normal BM 2 days ago. She denies blood in her stool.  Review of Systems      Past Medical History  Diagnosis Date  . Hypertension   . Hyperlipidemia   . Palpitations   . Thyroid disease   . Weakness generalized   . Anemia   . Other urinary problems   . Lightheadedness   . Dyspnea     w/ atypical upper airway symptoms? all vcd/lpr/gerd  . Breast pain   . Vasovagal syncope   . Cellulitis and abscess of unspecified digit   . Urinary tract infection, site not specified   . Urgency of urination   . Other symptoms involving abdomen and pelvis(789.9)   . Rash and other nonspecific skin eruption   . Unspecified venous (peripheral) insufficiency   . Edema   . Osteoarthrosis, unspecified whether generalized or localized, unspecified site   . Monoclonal paraproteinemia   . Irritable bowel syndrome   . Other B-complex deficiencies   . Unspecified hypothyroidism   . GERD (gastroesophageal reflux disease)   . Anxiety   . Torus palatinus     Current Outpatient Prescriptions  Medication Sig Dispense Refill  . albuterol (PROVENTIL HFA;VENTOLIN HFA) 108 (90 BASE) MCG/ACT inhaler Inhale into the lungs every 4 (four) hours as needed for wheezing.      Marland Kitchen aspirin 325 MG tablet Take 1 tablet (325 mg total) by  mouth daily.      . cholecalciferol (VITAMIN D) 1000 UNITS tablet Take 1,000 Units by mouth daily.        . cyanocobalamin (,VITAMIN B-12,) 1000 MCG/ML injection Inject 1 mL (1,000 mcg total) into the skin every 14 (fourteen) days.  30 mL  1  . folic acid (FOLVITE) [id number] MCG tablet Take 400 mcg by mouth daily.        . food thickener (THICK IT) POWD Thick all liquids    0  . labetalol (NORMODYNE) 200 MG tablet Take 1 tablet (200 mg total) by mouth 2 (two) times daily.      Marland Kitchen levothyroxine (SYNTHROID, LEVOTHROID) 88 MCG tablet TAKE 1 TABLET DAILY  90 tablet  3  . omeprazole (PRILOSEC) 40 MG capsule Take 1 capsule (40 mg total) by mouth daily.  90 capsule  3   No current facility-administered medications for this visit.    Allergies  Allergen Reactions  . Amlodipine Besylate     REACTION: edema  . Cefuroxime Axetil     REACTION: nausea  . Nitrofurantoin     REACTION: nausea  . Sulfadiazine     Family History  Problem Relation Age of Onset  . Hypertension Other   . Hypertension Father     History   Social History  . Marital Status: Married    Spouse Name: N/A  Number of Children: N/A  . Years of Education: N/A   Occupational History  . Not on file.   Social History Main Topics  . Smoking status: Never Smoker   . Smokeless tobacco: Not on file  . Alcohol Use: No  . Drug Use: No  . Sexually Active: Not on file   Other Topics Concern  . Not on file   Social History Narrative   Retired   Married   Never Smoked   Alcohol use- no     Constitutional: Denies fever, malaise, fatigue, headache or abrupt weight changes.  Respiratory: Denies difficulty breathing, shortness of breath, cough or sputum production.   Cardiovascular: Denies chest pain, chest tightness, palpitations or swelling in the hands or feet.  Gastrointestinal: Pt reports abdominal pain; Denies bloating, constipation, diarrhea or blood in the stool.  Neurological: Pt reports dizziness. Denies   difficulty with memory, difficulty with speech or problems with balance and coordination.   No other specific complaints in a complete review of systems (except as listed in HPI above).  Objective:   Physical Exam  BP 128/62  Pulse 70  Temp(Src) 98 F (36.7 C) (Oral)  Ht 5\' 3"  (1.6 m)  Wt 139 lb (63.05 kg)  BMI 24.63 kg/m2  SpO2 95% Wt Readings from Last 3 Encounters:  03/18/13 139 lb (63.05 kg)  01/22/13 152 lb 12.5 oz (69.3 kg)  01/22/13 152 lb 12.5 oz (69.3 kg)    General: Appears  her stated age, well developed, well nourished in NAD. Cardiovascular: Normal rate and rhythm. S1,S2 noted.  No murmur, rubs or gallops noted. No JVD or BLE edema. No carotid bruits noted. Pulmonary/Chest: Normal effort and positive vesicular breath sounds. No respiratory distress. No wheezes, rales or ronchi noted.  Abdomen: Soft and tender in the epigastric region. Hypoactive bowel sounds, no bruits noted. No distention or masses noted. Liver, spleen and kidneys non palpable. Positive Murphy's sign.  Neurological: Alert and oriented. Cranial nerves II-XII intact. Coordination normal. +DTRs bilaterally.     BMET    Component Value Date/Time   NA 135 01/22/2013 0625   K 3.9 01/22/2013 0625   CL 105 01/22/2013 0625   CO2 18* 01/22/2013 0625   GLUCOSE 109* 01/22/2013 0625   BUN 13 01/22/2013 0625   CREATININE 1.04 01/22/2013 0625   CALCIUM 7.7* 01/22/2013 0625   GFRNONAA 50* 01/22/2013 0625   GFRAA 58* 01/22/2013 0625    Lipid Panel     Component Value Date/Time   CHOL 110 01/08/2013 0535   TRIG 102 01/08/2013 0535   HDL 13* 01/08/2013 0535   CHOLHDL 8.5 01/08/2013 0535   VLDL 20 01/08/2013 0535   LDLCALC 77 01/08/2013 0535    CBC    Component Value Date/Time   WBC 8.9 01/22/2013 0625   RBC 2.93* 01/22/2013 0625   HGB 8.9* 01/22/2013 0625   HCT 26.5* 01/22/2013 0625   PLT 505* 01/22/2013 0625   MCV 90.4 01/22/2013 0625   MCH 30.4 01/22/2013 0625   MCHC 33.6 01/22/2013 0625   RDW 14.8 01/22/2013 0625    LYMPHSABS 0.9 01/12/2013 0340   MONOABS 0.8 01/12/2013 0340   EOSABS 0.0 01/12/2013 0340   BASOSABS 0.0 01/12/2013 0340    Hgb A1C Lab Results  Component Value Date   HGBA1C 5.7* 01/08/2013         Assessment & Plan:   Epigastric pain, new onset with additional workup reguired:  Will check amylase, lipase, CMET, CBC Will obtain ultrasound to  r/o gallstones, pancreatitis Will give 30 Toradol IM  Will f/u after test results

## 2013-03-19 ENCOUNTER — Other Ambulatory Visit: Payer: Self-pay | Admitting: Internal Medicine

## 2013-03-19 ENCOUNTER — Inpatient Hospital Stay (HOSPITAL_COMMUNITY): Payer: Medicare Other

## 2013-03-19 ENCOUNTER — Encounter (HOSPITAL_COMMUNITY): Payer: Self-pay | Admitting: *Deleted

## 2013-03-19 DIAGNOSIS — R109 Unspecified abdominal pain: Secondary | ICD-10-CM

## 2013-03-19 DIAGNOSIS — N179 Acute kidney failure, unspecified: Secondary | ICD-10-CM

## 2013-03-19 DIAGNOSIS — I6350 Cerebral infarction due to unspecified occlusion or stenosis of unspecified cerebral artery: Secondary | ICD-10-CM

## 2013-03-19 DIAGNOSIS — K8020 Calculus of gallbladder without cholecystitis without obstruction: Secondary | ICD-10-CM

## 2013-03-19 LAB — URINALYSIS, ROUTINE W REFLEX MICROSCOPIC
Glucose, UA: NEGATIVE mg/dL
Hgb urine dipstick: NEGATIVE
Specific Gravity, Urine: 1.01 (ref 1.005–1.030)
pH: 5.5 (ref 5.0–8.0)

## 2013-03-19 LAB — COMPREHENSIVE METABOLIC PANEL
AST: 15 U/L (ref 0–37)
Albumin: 2.7 g/dL — ABNORMAL LOW (ref 3.5–5.2)
Alkaline Phosphatase: 52 U/L (ref 39–117)
BUN: 18 mg/dL (ref 6–23)
CO2: 22 mEq/L (ref 19–32)
Chloride: 104 mEq/L (ref 96–112)
Potassium: 3.8 mEq/L (ref 3.5–5.1)
Total Bilirubin: 0.3 mg/dL (ref 0.3–1.2)

## 2013-03-19 LAB — CBC
MCH: 30.4 pg (ref 26.0–34.0)
MCV: 90.1 fL (ref 78.0–100.0)
Platelets: 196 10*3/uL (ref 150–400)
RDW: 14.3 % (ref 11.5–15.5)

## 2013-03-19 LAB — URINE MICROSCOPIC-ADD ON

## 2013-03-19 MED ORDER — WHITE PETROLATUM GEL
Status: AC
  Administered 2013-03-19: 20:00:00
  Filled 2013-03-19: qty 5

## 2013-03-19 MED ORDER — SODIUM CHLORIDE 0.9 % IV SOLN
INTRAVENOUS | Status: AC
  Administered 2013-03-20: 05:00:00 via INTRAVENOUS

## 2013-03-19 MED ORDER — FOLIC ACID 0.5 MG HALF TAB
0.5000 mg | ORAL_TABLET | Freq: Every day | ORAL | Status: DC
  Administered 2013-03-19 – 2013-03-21 (×3): 0.5 mg via ORAL
  Filled 2013-03-19 (×3): qty 1

## 2013-03-19 MED ORDER — TRAMADOL HCL 50 MG PO TABS: 50.0000 mg | ORAL_TABLET | Freq: Two times a day (BID) | ORAL | Status: DC | PRN

## 2013-03-19 MED ORDER — GADOBENATE DIMEGLUMINE 529 MG/ML IV SOLN
6.0000 mL | Freq: Once | INTRAVENOUS | Status: AC
  Administered 2013-03-19: 6 mL via INTRAVENOUS

## 2013-03-19 MED ORDER — CIPROFLOXACIN HCL 250 MG PO TABS
250.0000 mg | ORAL_TABLET | Freq: Two times a day (BID) | ORAL | Status: DC
  Administered 2013-03-19 – 2013-03-21 (×5): 250 mg via ORAL
  Filled 2013-03-19 (×6): qty 1

## 2013-03-19 NOTE — Consult Note (Signed)
[facility] Consultation  Referring Provider: [facility] Primary Care Physician:  Walker Kehr, MD Primary Gastroenterologist:    Earlie Raveling, MD     Reason for Consultation:     .Abnormal pancreatic findings on CTscan         HPI:   [name] [name] is a 79 y.o. female who was hospitalized in May for acute pancreatitis, presumed biliary with cholelithiasis seen on imaging.  Patient was was seen by Dr. Benson Norway that admission. Her lipase that admission was 1400, transaminases 250-350 range. LFTs improved, Dr. Benson Norway felt she likely had biliary pancreatitis and had passed a stone. Laparoscopic cholecystectomy was recommended. Her hospital course was complicated by a CVA. Patient was discharged to rehab where she recovered nicely after 5 weeks. She was scheduled to see PCP tomorrow to discuss surgical referral for cholecystectomy.No abdominal pain since May admission until this Sunday when she developed upper abdominal pain. Pain was intermittent on Sunday but became severe yesterday at which point patient saw PCP. Labs yesterday revealed abnormal creatinine, stable anemia, normal WBC. LFTs and lipase were normal.   Non-contrast CTscan showed new development of extensive retroperitoneal nodularity concerning for peritoneal carcinomatosis. Also, interval diffuse enlargement of the pancreatic body and tail, likely pancreatic necrosis and developing pseudocyst formation. Patient was to follow up with PCP this week but came to ED for worsening pain. No associated vomiting. Bowel habits unchanged (chronically constipated). No fevers. No weight loss. Patient gives a history of acid reflux for which she takes Prilosec.   Past Medical History  Diagnosis Date  . Hypertension   . Hyperlipidemia   . Palpitations   . Thyroid disease   . Weakness generalized   . Anemia   . Dyspnea     w/ atypical upper airway symptoms? all vcd/lpr/gerd  . Vasovagal syncope   . Cellulitis and abscess of  unspecified digit   . Urinary tract infection, site not specified   . Rash and other nonspecific skin eruption   . Unspecified venous (peripheral) insufficiency   . Osteoarthrosis, unspecified whether generalized or localized, unspecified site   . Monoclonal paraproteinemia   . Irritable bowel syndrome   . Other B-complex deficiencies   . Unspecified hypothyroidism   . GERD (gastroesophageal reflux disease)   . Anxiety   . Torus palatinus   . Stroke   . Pancreatitis     Past Surgical History  Procedure Laterality Date  . Back surgery    . Shoulder surgery    . Cholecystectomy    . Abdominal hysterectomy    . Vaginosacropexy    . Suprapubic bladder suspension    . Tee without cardioversion N/A 01/16/2013    Procedure: TRANSESOPHAGEAL ECHOCARDIOGRAM (TEE);  Surgeon: Josue Hector, MD;  Location: [facility] ENDOSCOPY;  Service: Cardiovascular;  Laterality: N/A;    Family History  Problem Relation Age of Onset  . Hypertension Other   . Hypertension Father   . Heart Problems Father    No colon cancer. No pancreatic cancer  History  Substance Use Topics  . Smoking status: Never Smoker   . Smokeless tobacco: Not on file  . Alcohol Use: Yes     Comment: Occasionally during the night to help sleep    Prior to Admission medications   Medication Sig Start Date End Date Taking? Authorizing Provider  aspirin 325 MG tablet Take 1 tablet (325 mg total) by mouth daily. 01/23/13  Yes Sorin June Leap, MD  cholecalciferol (VITAMIN D) 1000 UNITS tablet Take 1,000  Units by mouth daily.     Yes Historical Provider, MD  cyanocobalamin (,VITAMIN B-12,) 1000 MCG/ML injection Inject 1 mL (1,000 mcg total) into the skin every 14 (fourteen) days. 12/21/12  Yes Evie Lacks Plotnikov, MD  folic acid (FOLVITE) A999333 MCG tablet Take 400 mcg by mouth daily.   Yes Historical Provider, MD  labetalol (NORMODYNE) 100 MG tablet Take 100 mg by mouth 2 (two) times daily.   Yes Historical Provider, MD  levothyroxine  (SYNTHROID, LEVOTHROID) 88 MCG tablet Take 88 mcg by mouth daily before breakfast.   Yes Historical Provider, MD  omeprazole (PRILOSEC) 40 MG capsule Take 1 capsule (40 mg total) by mouth daily. 12/21/12  Yes Evie Lacks Plotnikov, MD  zaleplon (SONATA) 10 MG capsule Take 10 mg by mouth at bedtime.   Yes Historical Provider, MD  food thickener (THICK IT) POWD Thick all liquids 01/23/13   Sorin June Leap, MD  traMADol (ULTRAM) 50 MG tablet Take 1 tablet (50 mg total) by mouth 2 (two) times daily as needed for pain. 03/19/13   Webb Silversmith, NP    Current Facility-Administered Medications  Medication Dose Route Frequency Provider Last Rate Last Dose  . 0.9 %  sodium chloride infusion   Intravenous Continuous Bynum Bellows, MD 75 mL/hr at 03/19/13 1400    . acetaminophen (TYLENOL) tablet 650 mg  650 mg Oral Q6H PRN Bynum Bellows, MD       Or  . acetaminophen (TYLENOL) suppository 650 mg  650 mg Rectal Q6H PRN Bynum Bellows, MD      . aspirin tablet 325 mg  325 mg Oral Daily Bynum Bellows, MD   325 mg at 03/19/13 1048  . cholecalciferol (VITAMIN D) tablet 1,000 Units  1,000 Units Oral Daily Bynum Bellows, MD   1,000 Units at 03/19/13 1048  . ciprofloxacin (CIPRO) tablet 250 mg  250 mg Oral BID Bynum Bellows, MD   250 mg at 03/19/13 1048  . [START ON 04/01/2013] cyanocobalamin ((VITAMIN B-12)) injection 1,000 mcg  1,000 mcg Subcutaneous Q14 Days Bynum Bellows, MD      . enoxaparin (LOVENOX) injection 30 mg  30 mg Subcutaneous Q24H Bynum Bellows, MD   30 mg at 03/19/13 1049  . folic acid (FOLVITE) tablet 0.5 mg  0.5 mg Oral Daily Adeline C Viyuoh, MD   0.5 mg at 03/19/13 1048  . food thickener (THICK IT) powder   Oral PRN Bynum Bellows, MD      . labetalol (NORMODYNE) tablet 100 mg  100 mg Oral BID Bynum Bellows, MD   100 mg at 03/19/13 1048  . levothyroxine (SYNTHROID, LEVOTHROID) tablet 88 mcg  88 mcg Oral QAC breakfast Bynum Bellows, MD   88 mcg at 03/19/13 0849  . morphine 2 MG/ML injection 1 mg   1 mg Intravenous Q4H PRN Bynum Bellows, MD      . ondansetron [facility]) tablet 4 mg  4 mg Oral Q6H PRN Bynum Bellows, MD       Or  . ondansetron (ZOFRAN) injection 4 mg  4 mg Intravenous Q6H PRN Bynum Bellows, MD   4 mg at 03/19/13 1224  . pantoprazole (PROTONIX) EC tablet 80 mg  80 mg Oral Daily Bynum Bellows, MD   80 mg at 03/19/13 1051  . zolpidem (AMBIEN) tablet 5 mg  5 mg Oral QHS PRN Bynum Bellows, MD        Allergies as of  03/18/2013 - Review Complete 03/18/2013  Allergen Reaction Noted  . Amlodipine besylate  12/21/2007  . Cefuroxime axetil  10/27/2008  . Nitrofurantoin  10/27/2008  . Sulfadiazine Rash    Review of Systems:    All systems reviewed and negative except where noted in HPI.   Physical Exam:  Vital signs in last 24 hours: Temp:  [97.1 F (36.2 C)-98.1 F (36.7 C)] 97.6 F (36.4 C) (07/15 1309) Pulse Rate:  [61-80] 64 (07/15 1309) Resp:  [14-20] 17 (07/15 1309) BP: (132-186)/(41-75) 143/56 mmHg (07/15 1309) SpO2:  [96 %-100 %] 97 % (07/15 1309) Weight:  [134 lb 4.2 oz (60.9 kg)] 134 lb 4.2 oz (60.9 kg) (07/15 0015) Last BM Date: 03/18/13 General:   Pleasant white female in NAD Head:  Normocephalic and atraumatic. Eyes:   No icterus.   Conjunctiva pink. Ears:  Normal auditory acuity. Neck:  Supple; no masses felt Lungs:  Respirations even and unlabored. Lungs clear to auscultation bilaterally.   No wheezes, crackles, or rhonchi.  Heart:  Regular rate and rhythm Abdomen:  Soft, nondistended,mild diffuse tenderness. Normal bowel sounds. No appreciable masses or hepatomegaly.  Rectal:  Not performed.  Msk:  Symmetrical without gross deformities.  Extremities:  Without edema. Neurologic:  Alert and  oriented x4;  grossly normal neurologically. Skin:  Intact without significant lesions or rashes. Cervical Nodes:  No significant cervical adenopathy. Psych:  Alert and cooperative. Normal affect.  LAB RESULTS:  Recent Labs  03/18/13 1033 03/18/13 1830  03/19/13 0540  WBC 6.4 5.0 5.1  HGB 10.2* 9.7* 8.9*  HCT 29.9* 28.7* 26.4*  PLT 239.0 219 196   BMET  Recent Labs  03/18/13 1033 03/18/13 1830 03/19/13 0540  NA 137 135 136  K 4.8 4.1 3.8  CL 103 99 104  CO2 25 26 22   GLUCOSE 117* 131* 102*  BUN 18 19 18   CREATININE 1.7* 1.73* 1.63*  CALCIUM 9.5 9.3 8.6   LFT  Recent Labs  03/19/13 0540  PROT 6.3  ALBUMIN 2.7*  AST 15  ALT 9  ALKPHOS 52  BILITOT 0.3   PT/INR  Recent Labs  03/19/13 0540  LABPROT 13.8  INR 1.08    STUDIES: Ct Abdomen Pelvis Wo Contrast  03/18/2013   *RADIOLOGY REPORT*  Clinical Data: Mid abdominal pain.  History of pancreatitis.  CT ABDOMEN AND PELVIS WITHOUT CONTRAST  Technique:  Multidetector CT imaging of the abdomen and pelvis was performed following the standard protocol without intravenous contrast.  Comparison: Abdominal pelvic CT 03/01/2010 and 01/06/2013.  Findings: There is stable left basilar and lingular scarring.  No significant pleural or pericardial effusion is present.  There is a small hiatal hernia.  An 11 mm calcified gallstone is again noted.  There is no gallbladder wall thickening or biliary dilatation.  The liver, spleen, adrenal glands and kidneys demonstrate no significant findings.  There has been interval development of marked diffuse enlargement of the pancreas, measuring up to 10.0 x 3.8 cm transverse.  This enlargement primarily involves the pancreatic body and tail and is associated with multiple new irregular nodular soft tissue densities in the surrounding retroperitoneal fat, extending inferiorly along the left paracolic gutter and base of the mesentery.  The soft tissue densities appear nodular rather than tubular on this noncontrasted study. There is a dominant 1.9 cm nodular density within the left upper quadrant omentum on image number 17.  There is no generalized ascites.  The stomach and small bowel appear normal.  The appendix is  not visualized.  There are  diverticular changes of the sigmoid colon without surrounding inflammation.  The uterus is surgically absent. There is no adnexal mass.  The bladder is nearly empty and suboptimally evaluated.  There is mild aorto iliac atherosclerosis.  Postsurgical changes are present within the lumbar spine status post laminectomy and PLIF at L4-L5.  There are no acute osseous findings.  IMPRESSION:  1.  Interval development of extensive retroperitoneal nodularity asymmetric to the left, concerning for peritoneal carcinomatosis. There is no significant associated ascites.  Because these densities appear nodular rather than tubular, collateral vessel formation is considered less likely. The splenic vein was patent on the prior CT performed with contrast. 2.  Interval diffuse enlargement of the pancreatic body and tail, likely pancreatic necrosis and developing pseudocyst formation given the short interval since the prior study. Underlying pancreatic malignancy cannot be excluded.  MRI may be helpful for further evaluation, especially if the patient is able to receive contrast (the patient has renal insufficiency), prior to tissue sampling.  These results will be called to the ordering clinician or representative by the Radiologist Assistant, and communication documented in the PACS Dashboard.   Original Report Authenticated By: Richardean Sale, M.D.    PREVIOUS ENDOSCOPIES:            Screening colonoscopy May 2011 ([facility]). Findings small ascending colon polyp and diverticulosis. Repeat suggested at 5 year mark. Polyp path not available  EGD May 2011 [facility]) for dysphagia. Findings. Antral lesion and large hiatal hernia. Biopsy note available    Impression / Plan:   56. 79 year old female with history of acute pancreatitis in May of this year. LFTs were significantly elevated. Though abdominal imaging wasn't done patient had known gallstones from 2011 CTscan. Pancreatitis was therefore presumed biliary in nature. She  was scheduled to see PCP this week about getting lap chole done.   2. Upper abdominal pain, acute. LFTs and lipase normal. Noncontrast CTscan yesterday reveals marked diffuse enlargement of pancreas associated with multiple new irregular nodules in retroperitoneal fat. A 1.9 cm density is seen in LUQ omentum. See report above. Findings concerning for pancreatic necrosis / pseudocyst formation/ possible pancreatic malignancy. She is getting MRI this afternoon. Further recommendations following MRI results. Patient looks quite well at present.   3. GERD, continue PPI.  4. Cholelithiasis. A calcified 4mm gallstone seen on CTscan.   Thanks   LOS: 1 day   Tye Savoy  03/19/2013, 2:48 PM  Chart was reviewed and patient was examined. X-rays and lab were reviewed.   MRI, which I reviewed, demonstrates nodularity in the retroperitoneal space that is not contrast enhancing. Findings are more suggestive of fat necrosis than metastatic nodules.  Altogether I suspect that the changes on CT and MRI I secondary to pancreatic inflammation. Her transient episode of pain also is most likely due to pancreatic pain. Recommend proceeding with cholecystectomy and following up MRI in approximately 3 months  Nickola Lenig D. Deatra Ina, M.D., [facility] Cell [phone]

## 2013-03-19 NOTE — Progress Notes (Addendum)
[facility] PROGRESS NOTE  [name] [name] [id number] DOB: 1933-06-02 DOA: 03/18/2013 PCP: Walker Kehr, MD  Assessment/Plan: Epigastric pain with new CT changes of interval development of extensive retroperitoneal nodularity , diffuse enlargement of pancreatic body and tail with likely pancreatic necrosis and developing pseudocyst formation  -Unclear etiology, Lipase is normal.  Patient's liver function tests are also normal. -continue PPI - I have discussed above CT findings with pt and daughter, and consulted GI( [facility] to see) for further eval and recs. Cholelithiasis  -liver function tests are normal as above -Likely will benefit from outpatient general surgery consultation. If any clinical change, may consider inpatient general surgery consultation.  Hypertension  Continue home antihypertensive medication of labetalol.  Hypothyroidism  Continue levothyroxine.  Anemia due to B12 deficiency and due to chronic disease  Hemoglobin stable at this time. Continue to monitor. Continue B12 injections.  GERD  Continue PPI.  History of CVA in May of 2014 resulting in dysarthria and right-sided weakness  Stable. Continue aspirin.   Acute renal failure -improving with hydration-continue, follow and recheck      History of pancreatitis  Prophylaxis  Lovenox.      Code Status: full Family Communication: Daughter at bedside Disposition Plan: to home when stable   Consultants:  GI- [place] to see  Procedures:  none  Antibiotics:  cipro  HPI/Subjective: States she feels much better this am, thinks she must might have 'passed a gall stone' last pm. Denies n/v and no further abd pain.  Objective: Filed Vitals:   03/18/13 2336 03/19/13 0015 03/19/13 0402 03/19/13 0844  BP: 164/70 186/67 132/41 159/48  Pulse: 65 68 62 66  Temp:  97.8 F (36.6 C) 97.7 F (36.5 C) 98 F (36.7 C)  TempSrc:  Oral Oral   Resp: 20 14 16 17   Height:  5\' 3"  (1.6 m)    Weight:   60.9 kg (134 lb 4.2 oz)    SpO2: 100% 97% 96% 96%    Intake/Output Summary (Last 24 hours) at 03/19/13 1246 Last data filed at 03/19/13 0844  Gross per 24 hour  Intake   1465 ml  Output    900 ml  Net    565 ml   Filed Weights   03/19/13 0015  Weight: 60.9 kg (134 lb 4.2 oz)   Exam:  General: alert & oriented x 3, In NAD Cardiovascular: RRR, nl S1 s2 Respiratory: CTAB Abdomen: soft +BS epigastric tenderness present, ND, no masses palpable Extremities: No cyanosis and no edema     Data Reviewed: Basic Metabolic Panel:  Recent Labs Lab 03/18/13 1033 03/18/13 1830 03/19/13 0540  NA 137 135 136  K 4.8 4.1 3.8  CL 103 99 104  CO2 25 26 22   GLUCOSE 117* 131* 102*  BUN 18 19 18   CREATININE 1.7* 1.73* 1.63*  CALCIUM 9.5 9.3 8.6   Liver Function Tests:  Recent Labs Lab 03/18/13 1033 03/18/13 1830 03/19/13 0540  AST 20 17 15   ALT 14 12 9   ALKPHOS 52 63 52  BILITOT 0.5 0.3 0.3  PROT 7.9 7.6 6.3  ALBUMIN 3.5 3.2* 2.7*    Recent Labs Lab 03/18/13 1033 03/18/13 1830  LIPASE 29.0 37  AMYLASE 49  --    No results found for this basename: AMMONIA,  in the last 168 hours CBC:  Recent Labs Lab 03/18/13 1033 03/18/13 1830 03/19/13 0540  WBC 6.4 5.0 5.1  NEUTROABS  --  2.2  --   HGB 10.2* 9.7* 8.9*  HCT 29.9* 28.7* 26.4*  MCV 92.8 91.4 90.1  PLT 239.0 219 196   Cardiac Enzymes: No results found for this basename: CKTOTAL, CKMB, CKMBINDEX, TROPONINI,  in the last 168 hours BNP (last 3 results) No results found for this basename: PROBNP,  in the last 8760 hours CBG: No results found for this basename: GLUCAP,  in the last 168 hours  No results found for this or any previous visit (from the past 240 hour(s)).   Studies: Ct Abdomen Pelvis Wo Contrast  03/18/2013   *RADIOLOGY REPORT*  Clinical Data: Mid abdominal pain.  History of pancreatitis.  CT ABDOMEN AND PELVIS WITHOUT CONTRAST  Technique:  Multidetector CT imaging of the abdomen and pelvis was  performed following the standard protocol without intravenous contrast.  Comparison: Abdominal pelvic CT 03/01/2010 and 01/06/2013.  Findings: There is stable left basilar and lingular scarring.  No significant pleural or pericardial effusion is present.  There is a small hiatal hernia.  An 11 mm calcified gallstone is again noted.  There is no gallbladder wall thickening or biliary dilatation.  The liver, spleen, adrenal glands and kidneys demonstrate no significant findings.  There has been interval development of marked diffuse enlargement of the pancreas, measuring up to 10.0 x 3.8 cm transverse.  This enlargement primarily involves the pancreatic body and tail and is associated with multiple new irregular nodular soft tissue densities in the surrounding retroperitoneal fat, extending inferiorly along the left paracolic gutter and base of the mesentery.  The soft tissue densities appear nodular rather than tubular on this noncontrasted study. There is a dominant 1.9 cm nodular density within the left upper quadrant omentum on image number 17.  There is no generalized ascites.  The stomach and small bowel appear normal.  The appendix is not visualized.  There are diverticular changes of the sigmoid colon without surrounding inflammation.  The uterus is surgically absent. There is no adnexal mass.  The bladder is nearly empty and suboptimally evaluated.  There is mild aorto iliac atherosclerosis.  Postsurgical changes are present within the lumbar spine status post laminectomy and PLIF at L4-L5.  There are no acute osseous findings.  IMPRESSION:  1.  Interval development of extensive retroperitoneal nodularity asymmetric to the left, concerning for peritoneal carcinomatosis. There is no significant associated ascites.  Because these densities appear nodular rather than tubular, collateral vessel formation is considered less likely. The splenic vein was patent on the prior CT performed with contrast. 2.  Interval  diffuse enlargement of the pancreatic body and tail, likely pancreatic necrosis and developing pseudocyst formation given the short interval since the prior study. Underlying pancreatic malignancy cannot be excluded.  MRI may be helpful for further evaluation, especially if the patient is able to receive contrast (the patient has renal insufficiency), prior to tissue sampling.  These results will be called to the ordering clinician or representative by the Radiologist Assistant, and communication documented in the PACS Dashboard.   Original Report Authenticated By: Richardean Sale, M.D.    Scheduled Meds: . aspirin  325 mg Oral Daily  . cholecalciferol  1,000 Units Oral Daily  . ciprofloxacin  250 mg Oral BID  . [START ON 04/01/2013] cyanocobalamin  1,000 mcg Subcutaneous Q14 Days  . enoxaparin (LOVENOX) injection  30 mg Subcutaneous Q24H  . folic acid  0.5 mg Oral Daily  . labetalol  100 mg Oral BID  . levothyroxine  88 mcg Oral QAC breakfast  . pantoprazole  80 mg Oral Daily  Continuous Infusions: . sodium chloride 75 mL/hr at 03/19/13 0014    Principal Problem:   Epigastric abdominal pain Active Problems:   HYPOTHYROIDISM   HYPERLIPIDEMIA   ANEMIA-NOS   HYPERTENSION   GERD   Acute renal failure   Cholelithiasis   History of CVA (cerebrovascular accident)   History of pancreatitis    Time spent: [facility] Pager [phone]. If 7PM-7AM, please contact night-coverage at [email or web address], password [facility] 03/19/2013, 12:46 PM  LOS: 1 day

## 2013-03-19 NOTE — Evaluation (Signed)
Occupational Therapy Evaluation Patient Details Name: [name] [name] MRN: [id number] DOB: November 16, 1932 Today's Date: 03/19/2013 Time: 1230-1300 OT Time Calculation (min): 30 min  OT Assessment / Plan / Recommendation History of present illness Pt adm with epigastric pain and acute renal failure.  CT showed changes of interval development of extensive retroperitoneal nodularity , diffuse enlargement of pancreatic body and tail with likely pancreatic necrosis and developing pseudocyst formation   Clinical Impression   Pt plans to follow up at OP to continue OT to increase I with ADL activity    OT Assessment  All further OT needs can be met in the next venue of care    Follow Up Recommendations  Outpatient OT       Equipment Recommendations  None recommended by OT          Precautions / Restrictions Precautions Precautions: None       ADL  Toilet Transfer: Performed;Supervision/safety Toilet Transfer Method: Sit to stand Toilet Transfer Equipment: Regular height toilet Toileting - Clothing Manipulation and Hygiene: Performed;Supervision/safety Where Assessed - Toileting Clothing Manipulation and Hygiene: Standing Transfers/Ambulation Related to ADLs: Pt overall S with ADL activity . Much of OT session focused on activity for RUE to improve FM skills as pts goal is to fold towels and iorn    OT Diagnosis: Generalized weakness  OT Problem List: Decreased strength;Decreased range of motion O      Visit Information  Last OT Received On: 03/19/13 History of Present Illness: Pt adm with epigastric pain and acute renal failure.  CT showed changes of interval development of extensive retroperitoneal nodularity , diffuse enlargement of pancreatic body and tail with likely pancreatic necrosis and developing pseudocyst formation       Prior Functioning     Home Living Family/patient expects to be discharged to:: Private residence Living Arrangements: Children Available Help  at Discharge: Family;Available 24 hours/day Type of Home: House Home Access: Stairs to enter [employer] of Steps: 4 Entrance Stairs-Rails: None Home Equipment: Wheelchair - manual Prior Function Level of Independence: Needs assistance Gait / Transfers Assistance Needed: Independent ADL's / Homemaking Assistance Needed: Assist to fasten bra Communication Communication: No difficulties Dominant Hand: Right         Vision/Perception Vision - History Patient Visual Report: No change from baseline   Cognition  Cognition Arousal/Alertness: Awake/alert Behavior During Therapy: WFL for tasks assessed/performed Overall Cognitive Status: Within Functional Limits for tasks assessed    Extremity/Trunk Assessment Upper Extremity Assessment Upper Extremity Assessment: RUE deficits/detail RUE Deficits / Details: Pt recovering nicely with RUE but plans [place] to OP for further OT. Pt wants to get back to [place]     Mobility Transfers Transfers: Sit to Stand;Stand to Sit Sit to Stand: 5: Supervision;From bed;From toilet;With upper extremity assist Stand to Sit: 5: Supervision;To toilet;To bed;With upper extremity assist           End of Session OT - End of Session Activity Tolerance: Patient tolerated treatment well Patient left: in bed;with call bell/phone within reach;with family/visitor present  GO     [name] [name] 03/19/2013, 2:33 PM

## 2013-03-19 NOTE — Evaluation (Signed)
Physical Therapy Evaluation Patient Details Name: [name] [name] MRN: [id number] DOB: 04-08-1933 Today's Date: 03/19/2013 Time: [id number] PT Time Calculation (min): 19 min  PT Assessment / Plan / Recommendation History of Present Illness  Pt adm with epigastric pain and acute renal failure.  CT showed changes of interval development of extensive retroperitoneal nodularity , diffuse enlargement of pancreatic body and tail with likely pancreatic necrosis and developing pseudocyst formation  Clinical Impression  Pt has made excellent recovery from recent stroke.  No acute PT needs.  Recommended to pt and family that pt amb in hallways at least 3x/day with family or staff managing her IV pole.  Pt was scheduled to begin OPPT at [facility] which she can resume at [place].    PT Assessment  All further PT needs can be met in the next venue of care    Follow Up Recommendations  Outpatient PT (Pt was scheduled to start OPPT at [facility])    Does the patient have the potential to tolerate intense rehabilitation      Barriers to Discharge        Equipment Recommendations  None recommended by PT    Recommendations for Other Services     Frequency      Precautions / Restrictions Precautions Precautions: None   Pertinent Vitals/Pain N/A      Mobility  Bed Mobility Bed Mobility: Supine to Sit;Sitting - Scoot to Edge of Bed Supine to Sit: 7: Independent Sitting - Scoot to [employer] of Bed: 7: Independent Transfers Transfers: Sit to Stand;Stand to Sit Sit to Stand: 7: Independent Stand to Sit: 7: Independent Ambulation/Gait Ambulation/Gait Assistance: 7: Independent Ambulation Distance (Feet): 600 Feet Assistive device: None Gait Pattern: Within Functional Limits    Exercises     PT Diagnosis: Other (comment) (High level balance deficits)  PT Problem List: Decreased balance PT Treatment Interventions:       PT Goals(Current goals can be found in the care plan section)     Visit Information  Last PT Received On: 03/19/13 Assistance Needed: +1 History of Present Illness: Pt adm with epigastric pain and acute renal failure.  CT showed changes of interval development of extensive retroperitoneal nodularity , diffuse enlargement of pancreatic body and tail with likely pancreatic necrosis and developing pseudocyst formation       Prior Functioning  Home Living Family/patient expects to be discharged to:: Private residence Living Arrangements: Children Available Help at Discharge: Family;Available 24 hours/day Type of Home: House Home Access: Stairs to enter [employer] of Steps: 4 Entrance Stairs-Rails: None Home Equipment: Wheelchair - manual Prior Function Level of Independence: Needs assistance Gait / Transfers Assistance Needed: Independent ADL's / Homemaking Assistance Needed: Assist to fasten bra Communication Communication: No difficulties Dominant Hand: Right    Cognition  Cognition Arousal/Alertness: Awake/alert Behavior During Therapy: WFL for tasks assessed/performed Overall Cognitive Status: Within Functional Limits for tasks assessed    Extremity/Trunk Assessment Upper Extremity Assessment Upper Extremity Assessment: Defer to OT evaluation Lower Extremity Assessment Lower Extremity Assessment: Overall WFL for tasks assessed   Balance Balance Balance Assessed: Yes Static Standing Balance Static Standing - Balance Support: No upper extremity supported Static Standing - Level of Assistance: 7: Independent Single Leg Stance - Right Leg: 3 Single Leg Stance - Left Leg: 3 Tandem Stance - Left Leg: 5 Rhomberg - Eyes Opened: 30 Rhomberg - Eyes Closed: 15  End of Session PT - End of Session Activity Tolerance: Patient tolerated treatment well Patient left: in chair;with call bell/phone  within reach;with family/visitor present Nurse Communication: Mobility status  GP     [facility] 03/19/2013, 9:56 AM  [facility]  PT [phone]

## 2013-03-19 NOTE — Progress Notes (Signed)
Nursing Admission Note - Late Entry  Pt arrived to 6E08 via stretcher from the ED. A&Ox4, VSS, no distress noted. Pt's daughter at bedside. Pt admitted with abdominal pain, but currently denying pain. Pt has a history of CVA from May 2014 with some right sided weakness; also pt's daughter states when she gets very tired her speech becomes more slurred. On admission pt appeared slightly anxious with her speech noticeably slurred and having difficulty finding words. Rapid response notified and agreed that it was more than likely due to pt's anxiety and being very tired. After approx 15-20 mins, pt's speech cleared up and she appeared less anxious. Pt oriented to unit and surroundings. Call bell within reach. Bed alarm in place. Daughter in recliner at bedside. C.Zakeya Junker, RN.

## 2013-03-20 ENCOUNTER — Ambulatory Visit: Payer: Medicare Other | Admitting: Internal Medicine

## 2013-03-20 DIAGNOSIS — J9600 Acute respiratory failure, unspecified whether with hypoxia or hypercapnia: Secondary | ICD-10-CM

## 2013-03-20 DIAGNOSIS — R109 Unspecified abdominal pain: Secondary | ICD-10-CM

## 2013-03-20 DIAGNOSIS — K859 Acute pancreatitis without necrosis or infection, unspecified: Secondary | ICD-10-CM

## 2013-03-20 DIAGNOSIS — K8020 Calculus of gallbladder without cholecystitis without obstruction: Secondary | ICD-10-CM

## 2013-03-20 LAB — BASIC METABOLIC PANEL
CO2: 22 mEq/L (ref 19–32)
Chloride: 110 mEq/L (ref 96–112)
Potassium: 4.2 mEq/L (ref 3.5–5.1)
Sodium: 141 mEq/L (ref 135–145)

## 2013-03-20 LAB — URINE CULTURE

## 2013-03-20 NOTE — Progress Notes (Signed)
[facility] Progress Note   Subjective  feels okay. No abdominal pain   Objective   Vital signs in last 24 hours: Temp:  [97.6 F (36.4 C)-98.1 F (36.7 C)] 98 F (36.7 C) (07/16 0635) Pulse Rate:  [64-71] 71 (07/16 0635) Resp:  [17-20] 20 (07/16 0635) BP: (143-188)/(51-56) 160/55 mmHg (07/16 0635) SpO2:  [95 %-97 %] 95 % (07/16 0635) Weight:  [134 lb 4.2 oz (60.901 kg)] 134 lb 4.2 oz (60.901 kg) (07/15 2043) Last BM Date: 03/18/13 General:    Pleasant white female in NAD Abdomen:  Soft, nontender and nondistended. Normal bowel sounds. .Neurologic:  Alert and oriented,  grossly normal neurologically. Psych:  Cooperative. Normal mood and affect.   Lab Results:  Recent Labs  03/18/13 1033 03/18/13 1830 03/19/13 0540  WBC 6.4 5.0 5.1  HGB 10.2* 9.7* 8.9*  HCT 29.9* 28.7* 26.4*  PLT 239.0 219 196   BMET  Recent Labs  03/18/13 1830 03/19/13 0540 03/20/13 0625  NA 135 136 141  K 4.1 3.8 4.2  CL 99 104 110  CO2 26 22 22   GLUCOSE 131* 102* 103*  BUN 19 18 13   CREATININE 1.73* 1.63* 1.46*  CALCIUM 9.3 8.6 8.8   LFT  Recent Labs  03/19/13 0540  PROT 6.3  ALBUMIN 2.7*  AST 15  ALT 9  ALKPHOS 52  BILITOT 0.3    Studies/Results: Mr Abdomen W Wo Contrast  03/20/2013   *RADIOLOGY REPORT*  Clinical Data: Retroperitoneal lesions with question of pancreatic necrosis.  MRI ABDOMEN WITH AND WITHOUT CONTRAST  Technique:  Multiplanar multisequence MR imaging of the abdomen was performed both before and after administration of intravenous contrast.  Contrast: 41mL MULTIHANCE GADOBENATE DIMEGLUMINE 529 MG/ML IV SOLN  Comparison: CT scan from 03/18/2013 and 01/06/2013.  CT scan from 03/01/2010.  Findings: As seen on the recent CT scan, there is a large, complex cystic lesion in the body and tail of the pancreas.  The largest measures approximately 3.0 by the 2.26 cm.  The postcontrast sequences are markedly degraded by patient motion, effectively obscuring  anatomic detail of the pancreas.  For this reason, further characterization of the cystic lesion and its enhancement characteristics is not possible.  The lesion does not show substantial gross enhancement may be related to massive dilatation of the main duct or cystic change within the pancreatic parenchyma secondary to inflammation or necrosis.  No focal abnormalities seen in the liver or spleen.  There is no intra or extrahepatic biliary duct dilatation.  12 mm gallstone is seen in the region of the gallbladder neck.  Stomach is unremarkable.  No splenic lesion.  There is no adrenal nodule.  Small bilateral renal cortical cysts are evident.  The abnormal nodular soft tissue identified in the peripancreatic space and tracking towards and into the left paracolic gutter is again identified.  There is certainly associated vascular collateralization in the left abdomen (see image 60 of series 1501).  No abdominal aortic aneurysm.  No evidence for intraperitoneal free fluid.  The patient is status post previous lumbar fusion.  IMPRESSION: Limited study secondary to the patient's inability to reproducibly breath hold.  As noted on the previous CT scan, there is a large complex cystic mass essentially expanding and replacing the body and tail the pancreas.  Given the relatively rapid appearance over a 64-month interval and the lack of gross enhancement, it likely reflects an evolving pancreatic pseudocyst/sequelae of necrosis. However, neoplasm cannot be excluded, especially in light of the mesenteric  and peritoneal findings.  Peripancreatic retroperitoneal, mesenteric, and peritoneal nodular soft tissue tracks towards and involves the left paracolic gutter. There is certainly associated vascular collateralization in the left side of the abdomen, in the region of the left colon, and the abnormal soft tissue is concerning for peritoneal neoplasm.  Endoscopic ultrasound may prove helpful to further evaluate.  PET CT may  also provide additional characterization.   Original Report Authenticated By: Misty Stanley, M.D.     Assessment / Plan:   96. 79 year old female with history of acute pancreatitis (felt to be biliary). She was scheduled to see PCP this week about getting lap chole done.   2. Upper abdominal pain, acute. Pain resolved shortly after arriving to hospital. LFTs and lipase normal. Noncontrast CTscan reveals marked diffuse enlargement of pancreas associated with multiple new irregular nodules in retroperitoneal fat. A 1.9 cm density is seen in LUQ omentum. MRI results above but basically suggests evolving pseudocysts but neoplasm not excluded given mesenteric/ peritoneal soft tissue densities. Will obtain CA-19 and CEA. She may need EUS for further evaluation.   3. GERD, continue PPI.   4. Cholelithiasis. A calcified 87mm gallstone seen on CTscan.     LOS: 2 days   Tye Savoy  03/20/2013, 9:20 AM   See consult note from yesterday with updated results from MRI. Changes are likely inflammatory. Recommend proceeding with cholecystectomy .

## 2013-03-20 NOTE — Progress Notes (Addendum)
[facility] PROGRESS NOTE  [name] [name] [id number] DOB: 03/11/1933 DOA: 03/18/2013 PCP: Walker Kehr, MD  Assessment/Plan: Epigastric pain with new CT changes of interval development of extensive retroperitoneal nodularity , diffuse enlargement of pancreatic body and tail with likely pancreatic necrosis and developing pseudocyst formation.   -  MRI also demonstrates peritoneal, mesenteric and peritoneal nodular soft tissue concerning for possible underlying malignancy. This is supported by the incidence of vascular collateralization the left side of the abdomen in the region of the left colon with some abnormal soft tissue concerning for peritoneal neoplasm. - Radiology recommended either an endoscopic ultrasound or PET/CT, I feel the PET/CT would be less helpful in the setting of inflammation from pancreatitis as inflammation and possible underlying malignancy would light up -  Dr. Deatra Ina discussed findings with radiology and feels that the findings are likely related to inflammation -  Repeat MRI in 3 months by GI.  -  If the patient undergoes a laparoscopic cholecystectomy, perhaps an area that is concerning to be biopsied at that time? -  CEA and CA 19-9 was ordered by gastroenterology already  Cholelithiasis, liver function tests are normal as above.  Patient was noted to have an outpatient cholecystectomy, but she is requesting that this be done while she is hospitalized -Gen. surgery consultation for cholecystectomy, and possible intraoperative biopsy of one of these concerning nodules - Continue aspirin in the preoperative setting  Possible urinary tract infection, -  Started on ciprofloxacin on 7/15 -  F/u urine culture  Hypertension, systolic blood pressure is mildly elevated, however diastolics are low normal Continue home antihypertensive medication of labetalol.   Hypothyroidism, stable, continue levothyroxine.   Anemia due to B12 deficiency and due to chronic  disease.  Hemoglobin has been gradually trending down - Continue B12 injections.  -  Repeat CBC in a.m.  GERD, stable.  continue PPI.   History of CVA in May of 2014 resulting in dysarthria and right-sided weakness  Stable.  -  Hold aspirin in the preoperative setting, and restart as soon as possible   Acute renal failure, a baseline creatinine of 1, GFR of around 50s chronic kidney disease stage III.  Improving with hydration -  Repeat BMP in a.m.    History of pancreatitis with complex and progressive pseudocyst involving the body and tail of the pancreas  Code Status: full Family Communication: Daughter at bedside Disposition Plan: to home when stable, pending possible endoscopic ultrasound and cholecystectomy   Consultants:  GI- [facility]   General surgery  Procedures:  CT scan of abdomen and pelvis  MRI of the abdomen and pelvis  Antibiotics:  cipro  HPI/Subjective: States she feels well this morning.  She states she thinks she may have passed a gallstone the other day. Denies N/V.  Is hungry and would like to try to eat something.  Last BM was Monday.  Objective: Filed Vitals:   03/19/13 1828 03/19/13 2043 03/20/13 0635 03/20/13 0940  BP: 188/51 167/54 160/55 145/47  Pulse: 67 69 71 66  Temp: 98 F (36.7 C) 98.1 F (36.7 C) 98 F (36.7 C) 98.3 F (36.8 C)  TempSrc:  Oral Oral Oral  Resp: 20 20 20 18   Height:  5\' 3"  (1.6 m)    Weight:  60.901 kg (134 lb 4.2 oz)    SpO2: 97% 96% 95% 96%    Intake/Output Summary (Last 24 hours) at 03/20/13 1233 Last data filed at 03/20/13 0900  Gross per 24 hour  Intake 1852.5  ml  Output   2200 ml  Net -347.5 ml   Filed Weights   03/19/13 0015 03/19/13 2043  Weight: 60.9 kg (134 lb 4.2 oz) 60.901 kg (134 lb 4.2 oz)   Exam:  General: Caucasian female, alert & oriented x 3, In NAD HEENT:  MMM, NCAT Cardiovascular: RRR, nl S1 s2, 2+ pulses, warm extremities Respiratory: CTAB Abdomen: soft +BS, nondistended  nontender  Extremities: No cyanosis and trace ankle edema, normal tone and bulk Neuro:  Mild dysarthria, strength 4/5 right upper and right lower exterminate, and chronic, stable.  Mild dysmetria of the right upper extremity.     Data Reviewed: Basic Metabolic Panel:  Recent Labs Lab 03/18/13 1033 03/18/13 1830 03/19/13 0540 03/20/13 0625  NA 137 135 136 141  K 4.8 4.1 3.8 4.2  CL 103 99 104 110  CO2 25 26 22 22   GLUCOSE 117* 131* 102* 103*  BUN 18 19 18 13   CREATININE 1.7* 1.73* 1.63* 1.46*  CALCIUM 9.5 9.3 8.6 8.8   Liver Function Tests:  Recent Labs Lab 03/18/13 1033 03/18/13 1830 03/19/13 0540  AST 20 17 15   ALT 14 12 9   ALKPHOS 52 63 52  BILITOT 0.5 0.3 0.3  PROT 7.9 7.6 6.3  ALBUMIN 3.5 3.2* 2.7*    Recent Labs Lab 03/18/13 1033 03/18/13 1830  LIPASE 29.0 37  AMYLASE 49  --    No results found for this basename: AMMONIA,  in the last 168 hours CBC:  Recent Labs Lab 03/18/13 1033 03/18/13 1830 03/19/13 0540  WBC 6.4 5.0 5.1  NEUTROABS  --  2.2  --   HGB 10.2* 9.7* 8.9*  HCT 29.9* 28.7* 26.4*  MCV 92.8 91.4 90.1  PLT 239.0 219 196   Cardiac Enzymes: No results found for this basename: CKTOTAL, CKMB, CKMBINDEX, TROPONINI,  in the last 168 hours BNP (last 3 results) No results found for this basename: PROBNP,  in the last 8760 hours CBG: No results found for this basename: GLUCAP,  in the last 168 hours  Recent Results (from the past 240 hour(s))  URINE CULTURE     Status: None   Collection Time    03/18/13 12:46 AM      Result Value Range Status   Specimen Description URINE, CLEAN CATCH   Final   Special Requests NONE   Final   Culture  Setup Time 03/19/2013 00:46   Final   Colony Count 50,000 COLONIES/ML   Final   Culture ESCHERICHIA COLI   Final   Report Status PENDING   Incomplete     Studies: Ct Abdomen Pelvis Wo Contrast  03/18/2013   *RADIOLOGY REPORT*  Clinical Data: Mid abdominal pain.  History of pancreatitis.  CT ABDOMEN  AND PELVIS WITHOUT CONTRAST  Technique:  Multidetector CT imaging of the abdomen and pelvis was performed following the standard protocol without intravenous contrast.  Comparison: Abdominal pelvic CT 03/01/2010 and 01/06/2013.  Findings: There is stable left basilar and lingular scarring.  No significant pleural or pericardial effusion is present.  There is a small hiatal hernia.  An 11 mm calcified gallstone is again noted.  There is no gallbladder wall thickening or biliary dilatation.  The liver, spleen, adrenal glands and kidneys demonstrate no significant findings.  There has been interval development of marked diffuse enlargement of the pancreas, measuring up to 10.0 x 3.8 cm transverse.  This enlargement primarily involves the pancreatic body and tail and is associated with multiple new irregular nodular soft tissue  densities in the surrounding retroperitoneal fat, extending inferiorly along the left paracolic gutter and base of the mesentery.  The soft tissue densities appear nodular rather than tubular on this noncontrasted study. There is a dominant 1.9 cm nodular density within the left upper quadrant omentum on image number 17.  There is no generalized ascites.  The stomach and small bowel appear normal.  The appendix is not visualized.  There are diverticular changes of the sigmoid colon without surrounding inflammation.  The uterus is surgically absent. There is no adnexal mass.  The bladder is nearly empty and suboptimally evaluated.  There is mild aorto iliac atherosclerosis.  Postsurgical changes are present within the lumbar spine status post laminectomy and PLIF at L4-L5.  There are no acute osseous findings.  IMPRESSION:  1.  Interval development of extensive retroperitoneal nodularity asymmetric to the left, concerning for peritoneal carcinomatosis. There is no significant associated ascites.  Because these densities appear nodular rather than tubular, collateral vessel formation is considered  less likely. The splenic vein was patent on the prior CT performed with contrast. 2.  Interval diffuse enlargement of the pancreatic body and tail, likely pancreatic necrosis and developing pseudocyst formation given the Tyreece Gelles interval since the prior study. Underlying pancreatic malignancy cannot be excluded.  MRI may be helpful for further evaluation, especially if the patient is able to receive contrast (the patient has renal insufficiency), prior to tissue sampling.  These results will be called to the ordering clinician or representative by the Radiologist Assistant, and communication documented in the PACS Dashboard.   Original Report Authenticated By: Richardean Sale, M.D.   Mr Abdomen W Wo Contrast  03/20/2013   *RADIOLOGY REPORT*  Clinical Data: Retroperitoneal lesions with question of pancreatic necrosis.  MRI ABDOMEN WITH AND WITHOUT CONTRAST  Technique:  Multiplanar multisequence MR imaging of the abdomen was performed both before and after administration of intravenous contrast.  Contrast: 43mL MULTIHANCE GADOBENATE DIMEGLUMINE 529 MG/ML IV SOLN  Comparison: CT scan from 03/18/2013 and 01/06/2013.  CT scan from 03/01/2010.  Findings: As seen on the recent CT scan, there is a large, complex cystic lesion in the body and tail of the pancreas.  The largest measures approximately 3.0 by the 2.26 cm.  The postcontrast sequences are markedly degraded by patient motion, effectively obscuring anatomic detail of the pancreas.  For this reason, further characterization of the cystic lesion and its enhancement characteristics is not possible.  The lesion does not show substantial gross enhancement may be related to massive dilatation of the main duct or cystic change within the pancreatic parenchyma secondary to inflammation or necrosis.  No focal abnormalities seen in the liver or spleen.  There is no intra or extrahepatic biliary duct dilatation.  12 mm gallstone is seen in the region of the gallbladder neck.   Stomach is unremarkable.  No splenic lesion.  There is no adrenal nodule.  Small bilateral renal cortical cysts are evident.  The abnormal nodular soft tissue identified in the peripancreatic space and tracking towards and into the left paracolic gutter is again identified.  There is certainly associated vascular collateralization in the left abdomen (see image 60 of series 1501).  No abdominal aortic aneurysm.  No evidence for intraperitoneal free fluid.  The patient is status post previous lumbar fusion.  IMPRESSION: Limited study secondary to the patient's inability to reproducibly breath hold.  As noted on the previous CT scan, there is a large complex cystic mass essentially expanding and replacing the body and  tail the pancreas.  Given the relatively rapid appearance over a 65-month interval and the lack of gross enhancement, it likely reflects an evolving pancreatic pseudocyst/sequelae of necrosis. However, neoplasm cannot be excluded, especially in light of the mesenteric and peritoneal findings.  Peripancreatic retroperitoneal, mesenteric, and peritoneal nodular soft tissue tracks towards and involves the left paracolic gutter. There is certainly associated vascular collateralization in the left side of the abdomen, in the region of the left colon, and the abnormal soft tissue is concerning for peritoneal neoplasm.  Endoscopic ultrasound may prove helpful to further evaluate.  PET CT may also provide additional characterization.   Original Report Authenticated By: Misty Stanley, M.D.    Scheduled Meds: . aspirin  325 mg Oral Daily  . cholecalciferol  1,000 Units Oral Daily  . ciprofloxacin  250 mg Oral BID  . [START ON 04/01/2013] cyanocobalamin  1,000 mcg Subcutaneous Q14 Days  . enoxaparin (LOVENOX) injection  30 mg Subcutaneous Q24H  . folic acid  0.5 mg Oral Daily  . labetalol  100 mg Oral BID  . levothyroxine  88 mcg Oral QAC breakfast  . pantoprazole  80 mg Oral Daily   Continuous  Infusions: . sodium chloride 75 mL/hr at 03/20/13 [id number]    Principal Problem:   Epigastric abdominal pain Active Problems:   HYPOTHYROIDISM   HYPERLIPIDEMIA   ANEMIA-NOS   HYPERTENSION   GERD   Acute renal failure   Cholelithiasis   History of CVA (cerebrovascular accident)   History of pancreatitis    Time spent: 62    Marnita Poirier, [facility]  [facility] Pager [phone]. If 7PM-7AM, please contact night-coverage at [email or web address], password [email or web address] 03/20/2013, 12:33 PM  LOS: 2 days

## 2013-03-20 NOTE — Consult Note (Signed)
Reason for Consult: Pancreatitis, abnormal CT and MRI Referring Physician: Dr. Jearld Adjutant. PCP: Walker Kehr, MD     [name] [name] is an 79 y.o. female.  HPI: 79 y/o female admitted to [facility] on 01/06/13 with acute pancreatitis, with cholelithiasis seen on CT, Lipase 1400, on admit, elevated LFT's, which improved. It was Dr. Ulyses Amor opinion she had biliary pancreatitis and subsequently passed a stone.  She had a stroke shortly after admit to [facility], respiratory failure and renal failure and was transferred here to [facility] 01/07/13-01/23/13, for treatment.  She did well and was transferred to SNF after her hospital stay here for rehabilitation.  She has done remarkably well until last Sunday 7/13 when she developed some intermittent abdominal pain, first in the RUQ and then it was also present in the LUQ.  It got worse on 7/14 at which time she went to Dr. Judeen Hammans office for evaluation.  CT scan revealed Interval development of extensive retroperitoneal nodularity asymmetric to the left, concerning for peritoneal carcinomatosis.  Interval diffuse enlargement of the pancreatic body and tail, likely pancreatic necrosis and developing pseudocyst formation.  She was sent home but the pain got worse and she went to the ER at [facility].  Her pain has actually resolved while she was in the waiting room of the ER.  She had recurrent renal insuffiencey with some dehydration and she was admitted for observation.  MRI was performed today and that shows:  As noted on the previous CT scan, there is a large complex cystic mass essentially expanding and replacing the body and tail the pancreas. 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  There was also some concern this may be a malignancy. Lipase and LFT have been normal since her admission.  Her WBC remains normal;  her H/H declined some  since admit. She was scheduled to follow up with our office and has an appointment with DR. Byerly,  04/09/13 (it's in the computer now, patient and family were unaware of appointment)  for cholecystectomy evaluation. We are ask to see now for evaluation and recommendations.    Past Medical History  Diagnosis Date  . Hypertension   . Hyperlipidemia   . Palpitations   . Thyroid disease   . Weakness generalized   . Anemia   . Other urinary problems   . Lightheadedness   . Dyspnea     w/ atypical upper airway symptoms? all vcd/lpr/gerd  . Breast pain   . Vasovagal syncope   . Cellulitis and abscess of unspecified digit   . Urinary tract infection, site not specified   . Urgency of urination   . Other symptoms involving abdomen and pelvis(789.9)   . Rash and other nonspecific skin eruption   . Unspecified venous (peripheral) insufficiency   . Edema   . Osteoarthrosis, unspecified whether generalized or localized, unspecified site   . Monoclonal paraproteinemia   . Irritable bowel syndrome   . Other B-complex deficiencies   . Unspecified hypothyroidism   . GERD (gastroesophageal reflux disease)   . Anxiety   . Torus palatinus   . Stroke   . Pancreatitis   . Renal insufficiency     Past Surgical History  Procedure Laterality Date  . Back surgery    . Shoulder surgery    . Cholecystectomy    . Abdominal hysterectomy    . Vaginosacropexy    .  Suprapubic bladder suspension    . Tee without cardioversion N/A 01/16/2013    Procedure: TRANSESOPHAGEAL ECHOCARDIOGRAM (TEE);  Surgeon: Josue Hector, MD;  Location: [facility] ENDOSCOPY;  Service: Cardiovascular;  Laterality: N/A;    Family History  Problem Relation Age of Onset  . Hypertension Other   . Hypertension Father   . Heart Problems Father     Social History:  reports that she has never smoked. She does not have any smokeless tobacco history on file. She reports that  drinks alcohol. She reports that she does not use illicit  drugs.  Allergies:  Allergies  Allergen Reactions  . Amlodipine Besylate     REACTION: edema  . Cefuroxime Axetil     REACTION: nausea  . Nitrofurantoin     REACTION: nausea  . Sulfadiazine Rash    Medications:  Prior to Admission:  Prescriptions prior to admission  Medication Sig Dispense Refill  . aspirin 325 MG tablet Take 1 tablet (325 mg total) by mouth daily.      . cholecalciferol (VITAMIN D) 1000 UNITS tablet Take 1,000 Units by mouth daily.        . cyanocobalamin (,VITAMIN B-12,) 1000 MCG/ML injection Inject 1 mL (1,000 mcg total) into the skin every 14 (fourteen) days.  30 mL  1  . folic acid (FOLVITE) [id number] MCG tablet Take 400 mcg by mouth daily.      Marland Kitchen labetalol (NORMODYNE) 100 MG tablet Take 100 mg by mouth 2 (two) times daily.      Marland Kitchen levothyroxine (SYNTHROID, LEVOTHROID) 88 MCG tablet Take 88 mcg by mouth daily before breakfast.      . omeprazole (PRILOSEC) 40 MG capsule Take 1 capsule (40 mg total) by mouth daily.  90 capsule  3  . zaleplon (SONATA) 10 MG capsule Take 10 mg by mouth at bedtime.      . food thickener (THICK IT) POWD Thick all liquids    0   Scheduled: . cholecalciferol  1,000 Units Oral Daily  . ciprofloxacin  250 mg Oral BID  . [START ON 04/01/2013] cyanocobalamin  1,000 mcg Subcutaneous Q14 Days  . enoxaparin (LOVENOX) injection  30 mg Subcutaneous Q24H  . folic acid  0.5 mg Oral Daily  . labetalol  100 mg Oral BID  . levothyroxine  88 mcg Oral QAC breakfast  . pantoprazole  80 mg Oral Daily   Continuous: . sodium chloride 10 mL/hr (03/20/13 1248)   [id number], acetaminophen, food thickener, morphine injection, ondansetron (ZOFRAN) IV, ondansetron, zolpidem Anti-infectives   Start     Dose/Rate Route Frequency Ordered Stop   03/19/13 0800  ciprofloxacin (CIPRO) tablet 250 mg     250 mg Oral 2 times daily 03/19/13 [id number] 03/22/13 0759      Results for orders placed during the hospital encounter of 03/18/13 (from the past 48 hour(s))   CBC WITH DIFFERENTIAL     Status: Abnormal   Collection Time    03/18/13  6:30 PM      Result Value Range   WBC 5.0  4.0 - 10.5 K/uL   RBC 3.14 (*) 3.87 - 5.11 MIL/uL   Hemoglobin 9.7 (*) 12.0 - 15.0 g/dL   HCT 28.7 (*) 36.0 - 46.0 %   MCV 91.4  78.0 - 100.0 fL   MCH 30.9  26.0 - 34.0 pg   MCHC 33.8  30.0 - 36.0 g/dL   RDW 14.6  11.5 - 15.5 %   Platelets 219  150 - 400 K/uL  Neutrophils Relative % 43  43 - 77 %   Neutro Abs 2.2  1.7 - 7.7 K/uL   Lymphocytes Relative 41  12 - 46 %   Lymphs Abs 2.1  0.7 - 4.0 K/uL   Monocytes Relative 9  3 - 12 %   Monocytes Absolute 0.5  0.1 - 1.0 K/uL   Eosinophils Relative 6 (*) 0 - 5 %   Eosinophils Absolute 0.3  0.0 - 0.7 K/uL   Basophils Relative 0  0 - 1 %   Basophils Absolute 0.0  0.0 - 0.1 K/uL  COMPREHENSIVE METABOLIC PANEL     Status: Abnormal   Collection Time    03/18/13  6:30 PM      Result Value Range   Sodium 135  135 - 145 mEq/L   Potassium 4.1  3.5 - 5.1 mEq/L   Chloride 99  96 - 112 mEq/L   CO2 26  19 - 32 mEq/L   Glucose, Bld 131 (*) 70 - 99 mg/dL   BUN 19  6 - 23 mg/dL   Creatinine, Ser 1.73 (*) 0.50 - 1.10 mg/dL   Calcium 9.3  8.4 - 10.5 mg/dL   Total Protein 7.6  6.0 - 8.3 g/dL   Albumin 3.2 (*) 3.5 - 5.2 g/dL   AST 17  0 - 37 U/L   ALT 12  0 - 35 U/L   Alkaline Phosphatase 63  39 - 117 U/L   Total Bilirubin 0.3  0.3 - 1.2 mg/dL   GFR calc non Af Amer 27 (*) >90 mL/min   GFR calc Af Amer 31 (*) >90 mL/min   Comment:            The eGFR has been calculated     using the CKD EPI equation.     This calculation has not been     validated in all clinical     situations.     eGFR's persistently     <90 mL/min signify     possible Chronic Kidney Disease.  LIPASE, BLOOD     Status: None   Collection Time    03/18/13  6:30 PM      Result Value Range   Lipase 37  11 - 59 U/L  POCT I-STAT TROPONIN I     Status: None   Collection Time    03/18/13  6:59 PM      Result Value Range   Troponin i, poc 0.01  0.00 -  0.08 ng/mL   Comment 3            Comment: Due to the release kinetics of cTnI,     a negative result within the first hours     of the onset of symptoms does not rule out     myocardial infarction with certainty.     If myocardial infarction is still suspected,     repeat the test at appropriate intervals.  URINALYSIS, ROUTINE W REFLEX MICROSCOPIC     Status: Abnormal   Collection Time    03/18/13  8:21 PM      Result Value Range   Color, Urine YELLOW  YELLOW   APPearance HAZY (*) CLEAR   Specific Gravity, Urine 1.010  1.005 - 1.030   pH 5.5  5.0 - 8.0   Glucose, UA NEGATIVE  NEGATIVE mg/dL   Hgb urine dipstick NEGATIVE  NEGATIVE   Bilirubin Urine NEGATIVE  NEGATIVE   Ketones, ur NEGATIVE  NEGATIVE mg/dL  Protein, ur NEGATIVE  NEGATIVE mg/dL   Urobilinogen, UA 0.2  0.0 - 1.0 mg/dL   Nitrite NEGATIVE  NEGATIVE   Leukocytes, UA LARGE (*) NEGATIVE  URINE MICROSCOPIC-ADD ON     Status: Abnormal   Collection Time    03/18/13  8:21 PM      Result Value Range   Squamous Epithelial / LPF FEW (*) RARE   WBC, UA 11-20  <3 WBC/hpf   RBC / HPF 0-2  <3 RBC/hpf   Bacteria, UA RARE  RARE   Urine-Other MUCOUS PRESENT    COMPREHENSIVE METABOLIC PANEL     Status: Abnormal   Collection Time    03/19/13  5:40 AM      Result Value Range   Sodium 136  135 - 145 mEq/L   Potassium 3.8  3.5 - 5.1 mEq/L   Chloride 104  96 - 112 mEq/L   CO2 22  19 - 32 mEq/L   Glucose, Bld 102 (*) 70 - 99 mg/dL   BUN 18  6 - 23 mg/dL   Creatinine, Ser 1.63 (*) 0.50 - 1.10 mg/dL   Calcium 8.6  8.4 - 10.5 mg/dL   Total Protein 6.3  6.0 - 8.3 g/dL   Albumin 2.7 (*) 3.5 - 5.2 g/dL   AST 15  0 - 37 U/L   ALT 9  0 - 35 U/L   Alkaline Phosphatase 52  39 - 117 U/L   Total Bilirubin 0.3  0.3 - 1.2 mg/dL   GFR calc non Af Amer 29 (*) >90 mL/min   GFR calc Af Amer 34 (*) >90 mL/min   Comment:            The eGFR has been calculated     using the CKD EPI equation.     This calculation has not been     validated  in all clinical     situations.     eGFR's persistently     <90 mL/min signify     possible Chronic Kidney Disease.  CBC     Status: Abnormal   Collection Time    03/19/13  5:40 AM      Result Value Range   WBC 5.1  4.0 - 10.5 K/uL   RBC 2.93 (*) 3.87 - 5.11 MIL/uL   Hemoglobin 8.9 (*) 12.0 - 15.0 g/dL   HCT 26.4 (*) 36.0 - 46.0 %   MCV 90.1  78.0 - 100.0 fL   MCH 30.4  26.0 - 34.0 pg   MCHC 33.7  30.0 - 36.0 g/dL   RDW 14.3  11.5 - 15.5 %   Platelets 196  150 - 400 K/uL  PROTIME-INR     Status: None   Collection Time    03/19/13  5:40 AM      Result Value Range   Prothrombin Time 13.8  11.6 - 15.2 seconds   INR 1.08  0.00 - [id number]  BASIC METABOLIC PANEL     Status: Abnormal   Collection Time    03/20/13  6:25 AM      Result Value Range   Sodium 141  135 - 145 mEq/L   Potassium 4.2  3.5 - 5.1 mEq/L   Chloride 110  96 - 112 mEq/L   CO2 22  19 - 32 mEq/L   Glucose, Bld 103 (*) 70 - 99 mg/dL   BUN 13  6 - 23 mg/dL   Creatinine, Ser 1.46 (*) 0.50 - 1.10 mg/dL  Calcium 8.8  8.4 - 10.5 mg/dL   GFR calc non Af Amer 33 (*) >90 mL/min   GFR calc Af Amer 39 (*) >90 mL/min   Comment:            The eGFR has been calculated     using the CKD EPI equation.     This calculation has not been     validated in all clinical     situations.     eGFR's persistently     <90 mL/min signify     possible Chronic Kidney Disease.    Ct Abdomen Pelvis Wo Contrast  03/18/2013   *RADIOLOGY REPORT*  Clinical Data: Mid abdominal pain.  History of pancreatitis.  CT ABDOMEN AND PELVIS WITHOUT CONTRAST  Technique:  Multidetector CT imaging of the abdomen and pelvis was performed following the standard protocol without intravenous contrast.  Comparison: Abdominal pelvic CT 03/01/2010 and 01/06/2013.  Findings: There is stable left basilar and lingular scarring.  No significant pleural or pericardial effusion is present.  There is a small hiatal hernia.  An 11 mm calcified gallstone is again noted.   There is no gallbladder wall thickening or biliary dilatation.  The liver, spleen, adrenal glands and kidneys demonstrate no significant findings.  There has been interval development of marked diffuse enlargement of the pancreas, measuring up to 10.0 x 3.8 cm transverse.  This enlargement primarily involves the pancreatic body and tail and is associated with multiple new irregular nodular soft tissue densities in the surrounding retroperitoneal fat, extending inferiorly along the left paracolic gutter and base of the mesentery.  The soft tissue densities appear nodular rather than tubular on this noncontrasted study. There is a dominant 1.9 cm nodular density within the left upper quadrant omentum on image number 17.  There is no generalized ascites.  The stomach and small bowel appear normal.  The appendix is not visualized.  There are diverticular changes of the sigmoid colon without surrounding inflammation.  The uterus is surgically absent. There is no adnexal mass.  The bladder is nearly empty and suboptimally evaluated.  There is mild aorto iliac atherosclerosis.  Postsurgical changes are present within the lumbar spine status post laminectomy and PLIF at L4-L5.  There are no acute osseous findings.  IMPRESSION:  1.  Interval development of extensive retroperitoneal nodularity asymmetric to the left, concerning for peritoneal carcinomatosis. There is no significant associated ascites.  Because these densities appear nodular rather than tubular, collateral vessel formation is considered less likely. The splenic vein was patent on the prior CT performed with contrast. 2.  Interval diffuse enlargement of the pancreatic body and tail, likely pancreatic necrosis and developing pseudocyst formation given the short interval since the prior study. Underlying pancreatic malignancy cannot be excluded.  MRI may be helpful for further evaluation, especially if the patient is able to receive contrast (the patient has  renal insufficiency), prior to tissue sampling.  These results will be called to the ordering clinician or representative by the Radiologist Assistant, and communication documented in the PACS Dashboard.   Original Report Authenticated By: Richardean Sale, M.D.   Mr Abdomen W Wo Contrast  03/20/2013   *RADIOLOGY REPORT*  Clinical Data: Retroperitoneal lesions with question of pancreatic necrosis.  MRI ABDOMEN WITH AND WITHOUT CONTRAST  Technique:  Multiplanar multisequence MR imaging of the abdomen was performed both before and after administration of intravenous contrast.  Contrast: 52mL MULTIHANCE GADOBENATE DIMEGLUMINE 529 MG/ML IV SOLN  Comparison: CT scan from 03/18/2013 and  01/06/2013.  CT scan from 03/01/2010.  Findings: As seen on the recent CT scan, there is a large, complex cystic lesion in the body and tail of the pancreas.  The largest measures approximately 3.0 by the 2.26 cm.  The postcontrast sequences are markedly degraded by patient motion, effectively obscuring anatomic detail of the pancreas.  For this reason, further characterization of the cystic lesion and its enhancement characteristics is not possible.  The lesion does not show substantial gross enhancement may be related to massive dilatation of the main duct or cystic change within the pancreatic parenchyma secondary to inflammation or necrosis.  No focal abnormalities seen in the liver or spleen.  There is no intra or extrahepatic biliary duct dilatation.  12 mm gallstone is seen in the region of the gallbladder neck.  Stomach is unremarkable.  No splenic lesion.  There is no adrenal nodule.  Small bilateral renal cortical cysts are evident.  The abnormal nodular soft tissue identified in the peripancreatic space and tracking towards and into the left paracolic gutter is again identified.  There is certainly associated vascular collateralization in the left abdomen (see image 60 of series 1501).  No abdominal aortic aneurysm.  No evidence  for intraperitoneal free fluid.  The patient is status post previous lumbar fusion.  IMPRESSION: Limited study secondary to the patient's inability to reproducibly breath hold.  As noted on the previous CT scan, there is a large complex cystic mass essentially expanding and replacing the body and tail the pancreas.  Given the relatively rapid appearance over a 44-month interval and the lack of gross enhancement, it likely reflects an evolving pancreatic pseudocyst/sequelae of necrosis. However, neoplasm cannot be excluded, especially in light of the mesenteric and peritoneal findings.  Peripancreatic retroperitoneal, mesenteric, and peritoneal nodular soft tissue tracks towards and involves the left paracolic gutter. There is certainly associated vascular collateralization in the left side of the abdomen, in the region of the left colon, and the abnormal soft tissue is concerning for peritoneal neoplasm.  Endoscopic ultrasound may prove helpful to further evaluate.  PET CT may also provide additional characterization.   Original Report Authenticated By: Misty Stanley, M.D.    Review of Systems  Constitutional: Negative for fever, chills, weight loss, malaise/fatigue and diaphoresis.       She says she lost some weight at SNF, but has pretty much regained it since she went home 2 week ago.  Her daughter says she still has some speech issues, and swallowing something dry like chicken.    HENT: Negative.   Eyes: Positive for blurred vision (Right eye since her stroke and this is getting better.). Negative for double vision, photophobia, pain, discharge and redness.  Respiratory: Negative.   Cardiovascular: Positive for leg swelling (she notes some swelling if up all day, but it gets better over night.).  Gastrointestinal: Positive for heartburn (OK on PPI), abdominal pain (pain on 7/13 intermittent, and worse on 7/14.  resolved in the ER wait and no pain since she was admitted.) and constipation (chronic  issue). Negative for nausea, vomiting, diarrhea, blood in stool and melena.  Genitourinary: Negative.   Musculoskeletal: Negative.   Skin: Negative.   Neurological: Negative.  Negative for weakness.       She still has some weakness RUE and RLE, markedly improved, she does theraphy and can walk to garden without assistance.  She still has some speech issues and minor swallowing issues.  Endo/Heme/Allergies: Negative.   Psychiatric/Behavioral: Negative.    Blood  pressure 145/47, pulse 66, temperature 98.3 F (36.8 C), temperature source Oral, resp. rate 18, height 5\' 3"  (1.6 m), weight 60.901 kg (134 lb 4.2 oz), SpO2 96.00%. Physical Exam  Constitutional: She is oriented to person, place, and time. She appears well-developed and well-nourished. No distress.  HENT:  Head: Normocephalic and atraumatic.  Nose: Nose normal.  May have just a minor facial droop on the right.    Eyes: Conjunctivae and EOM are normal. Pupils are equal, round, and reactive to light. Right eye exhibits no discharge. Left eye exhibits no discharge.  Neck: Normal range of motion. Neck supple. No JVD present. No tracheal deviation present. No thyromegaly present.  Cardiovascular: Normal rate, regular rhythm, normal heart sounds and intact distal pulses.  Exam reveals no gallop.   No murmur heard. Respiratory: Effort normal and breath sounds normal. No respiratory distress. She has no wheezes. She has no rales. She exhibits no tenderness.  GI: Soft. Bowel sounds are normal. She exhibits no distension and no mass. There is no tenderness. There is no rebound and no guarding.  Musculoskeletal: She exhibits edema (trace in both lower legs).  Lymphadenopathy:    She has no cervical adenopathy.  Neurological: She is alert and oriented to person, place, and time. No cranial nerve deficit.  Skin: No rash noted. She is diaphoretic. No erythema. No pallor.  Psychiatric: She has a normal mood and affect. Her behavior is normal.  Judgment and thought content normal.    Assessment/Plan: 1.  Abdominal pain with history of:  Gallstone pancreatitis with cholelithiasis 01/06/13.  Now with recurrent pain and abnormal CT scan.  Probable pseudocyst, verses some type of malignancy. She has most likely passed another stone. 2.S/P CVA with right sided deficits, dysphagia, and some expressive aphasia, all much improved. 3.Acute renal failure 01/07/13, now with recurrent renal failure, and dehydration. 4.History of hypertension 5.Hyperlipidemia 6.Hx of GERD/LES weakness in the past.  Stable on PPI. Hx of IBS. 8.Hx of anemia  Plan:  Dr.Toth will review the CT and MRI.  We will see if we can review CT from [place] on our system so we can compare her original CT with the current one.  Recommendation to follow after we have a chance to review all the studies. CEA and Ca 19-9 have been ordered.  Destine Zirkle 03/20/2013, 2:25 PM

## 2013-03-20 NOTE — Consult Note (Signed)
I would be a little reluctant to put someone under general anesthesia while they are recovering from an acute stroke. If we were to pursue surgery to remove her gallbladder she would need cardiac clearance. She also has what looks like a pancreatic pseudocyst but the age of this is difficult to pinpoint since the original pancreatitis was in may. It may also be good to give her a few weeks and reimage her to see if the pseudocyst is stable, resolving, or getting bigger. We will follow

## 2013-03-21 DIAGNOSIS — E785 Hyperlipidemia, unspecified: Secondary | ICD-10-CM

## 2013-03-21 DIAGNOSIS — N179 Acute kidney failure, unspecified: Secondary | ICD-10-CM

## 2013-03-21 DIAGNOSIS — Z5189 Encounter for other specified aftercare: Secondary | ICD-10-CM

## 2013-03-21 DIAGNOSIS — I69959 Hemiplegia and hemiparesis following unspecified cerebrovascular disease affecting unspecified side: Secondary | ICD-10-CM

## 2013-03-21 DIAGNOSIS — R933 Abnormal findings on diagnostic imaging of other parts of digestive tract: Secondary | ICD-10-CM

## 2013-03-21 DIAGNOSIS — D649 Anemia, unspecified: Secondary | ICD-10-CM

## 2013-03-21 DIAGNOSIS — K8020 Calculus of gallbladder without cholecystitis without obstruction: Secondary | ICD-10-CM

## 2013-03-21 LAB — CBC
HCT: 25.7 % — ABNORMAL LOW (ref 36.0–46.0)
Hemoglobin: 8.6 g/dL — ABNORMAL LOW (ref 12.0–15.0)
MCH: 30.6 pg (ref 26.0–34.0)
MCV: 91.5 fL (ref 78.0–100.0)
Platelets: 177 10*3/uL (ref 150–400)
RBC: 2.81 MIL/uL — ABNORMAL LOW (ref 3.87–5.11)
WBC: 4.6 10*3/uL (ref 4.0–10.5)

## 2013-03-21 LAB — COMPREHENSIVE METABOLIC PANEL
AST: 16 U/L (ref 0–37)
Albumin: 2.7 g/dL — ABNORMAL LOW (ref 3.5–5.2)
BUN: 11 mg/dL (ref 6–23)
Calcium: 8.9 mg/dL (ref 8.4–10.5)
Chloride: 109 mEq/L (ref 96–112)
Creatinine, Ser: 1.39 mg/dL — ABNORMAL HIGH (ref 0.50–1.10)
Total Bilirubin: 0.2 mg/dL — ABNORMAL LOW (ref 0.3–1.2)
Total Protein: 6 g/dL (ref 6.0–8.3)

## 2013-03-21 LAB — CANCER ANTIGEN 19-9: CA 19-9: 5.6 U/mL — ABNORMAL LOW (ref <35.0)

## 2013-03-21 LAB — CEA: CEA: 2.7 ng/mL (ref 0.0–5.0)

## 2013-03-21 MED ORDER — LABETALOL HCL 200 MG PO TABS
200.0000 mg | ORAL_TABLET | Freq: Two times a day (BID) | ORAL | Status: DC
  Filled 2013-03-21: qty 1

## 2013-03-21 MED ORDER — LABETALOL HCL 200 MG PO TABS: 200.0000 mg | ORAL_TABLET | Freq: Two times a day (BID) | ORAL | Status: DC

## 2013-03-21 MED ORDER — CEPHALEXIN 500 MG PO CAPS: 500.0000 mg | ORAL_CAPSULE | Freq: Two times a day (BID) | ORAL | Status: DC

## 2013-03-21 MED ORDER — CEPHALEXIN 500 MG PO CAPS
500.0000 mg | ORAL_CAPSULE | Freq: Two times a day (BID) | ORAL | Status: DC
  Administered 2013-03-21: 500 mg via ORAL
  Filled 2013-03-21 (×3): qty 1

## 2013-03-21 MED ORDER — ASPIRIN EC 325 MG PO TBEC
325.0000 mg | DELAYED_RELEASE_TABLET | Freq: Every day | ORAL | Status: DC
  Filled 2013-03-21: qty 1

## 2013-03-21 NOTE — Discharge Summary (Addendum)
Physician Discharge Summary  [name] [name] [id number] DOB: 1933-01-18 DOA: 03/18/2013  PCP: Walker Kehr, MD  Admit date: 03/18/2013 Discharge date: 03/21/2013  Recommendations for Outpatient Follow-up:  1. Outpatient physical and occupational therapy 2. Follow up with Dr. Leonie Man regarding stroke within 2-4 weeks of discharge 3. Follow up with [facility] cardiology within 2-4 weeks of discharge for preoperative clearance for possible cholecystectomy and outpatient telemetry if not already complete.   4. Follow up with General Surgery at already scheduled appointment in approximately 1 month.   5. Follow up with primary care doctor within 1-2 weeks of discharge.  Please check blood pressure and HR as I have increased her labetalol.  Follow up on BMP and CBC to follow up AKI and anemia.    Discharge Diagnoses:  Principal Problem:   Epigastric abdominal pain Active Problems:   HYPOTHYROIDISM   HYPERLIPIDEMIA   ANEMIA-NOS   HYPERTENSION   GERD   Acute renal failure   Cholelithiasis   History of CVA (cerebrovascular accident)   History of pancreatitis   Discharge Condition: Stable, improved  Diet recommendation: Regular  Wt Readings from Last 3 Encounters:  03/19/13 60.901 kg (134 lb 4.2 oz)  03/18/13 63.05 kg (139 lb)  01/22/13 69.3 kg (152 lb 12.5 oz)    History of present illness:  [name] [name] is a 79 y.o. Caucasian female with history of hypertension, hyperlipidemia, hypothyroidism, anemia, recent history of CVA in May 2014 with dysarthria and right-sided deficits, recent history of pancreatitis due to common bile duct stone which has passed, history of cholelithiasis, acute renal failure, dysphagia due to CVA, and hypertrophic cardiomyopathy who presents with the above complaints. Patient had a prolonged hospital stay from 01/07/2013 to 01/23/2013 due to CVA, acute pancreatitis from cholelithiasis with stone passing in her common bile duct. Hospital course was complicated by  acute renal failure. Patient also had acute respiratory failure requiring intubation and mechanical ventilation from 01/09/2013 to 01/10/2013. Hospital course was also complicated by pneumonia. After discharge patient went to skilled nursing facility at [place]. She was discharged home 2 weeks ago and has been living with her daughter. She has made good progress at skilled nursing facility and at home, she had improvement in her right side from her stroke. Her speech and also has improved. Yesterday morning and during the night patient had epigastric pain which was sharp. The pain was radiating to her left upper quadrant. Patient did not have any nausea or vomiting. However due to her history of cholelithiasis and pancreatitis from choledocholithiasis, she called her primary care physician who did labs and CT of her abdomen pelvis without contrast. Lab showed she was in acute renal failure with creatinine of 1.7, and CT of abdomen and pelvis without contrast showed interval development of extensive retroperitoneal nodularity concerning for peritoneal carcinomatosis versus reactive changes from recent pancreatitis. CT also showed interval diffuse enlargement of pancreatic body and tail and likely pancreatic necrosis with developing pseudocyst. Hospitalist service was asked to admit the patient for further care and management. Patient denies any recent fevers but has chills at home. Denies any chest pain, shortness of breath, diarrhea, headaches or new vision changes. Currently denies any abdominal pain. Patient admits that she does not drink adequate fluids at home to hydrate herself.   Hospital Course:   Ms. See was admitted with abdominal pain. She underwent a CT scan without contrast because of the mild acute kidney injury which demonstrated diffuse enlargement of the pancreatic body and tail with  pancreatic necrosis and evolving pseudocyst, and extensive retroperitoneal nodularity. She was seen by  gastroenterology who recommended a followup MRI area her MRI of abdomen and pelvis demonstrated peritoneal, mesenteric, and peritoneal nodular soft tissues concerning for possible underlying malignancy. The gastroenterologist reviewed these findings with the radiologist and because of the lack of enhancement and the fact that she has considerable intra-abdominal inflammation from her recent pancreatitis, they felt that these nodules are more likely related to inflammatory changes and less likely related to neoplasm.  Radiology recommended possibly a PET CT or endoscopic ultrasound to further characterize the lesions, however, PET CT would not be able to distinguish inflammation from malignancy, and gastroenterology did not think that they would be able to characterize the lesions further at this time with endoscopic ultrasound. They recommended repeating the MRI in approximately 3 months. If there is evolution concerning for possible malignancy, shaking and undergo further workup at that time. At this time she does not have any other signs of malignancy such as unexplained weight loss, changes to her bowel habits.  Her CEA and CA 19-9 were both within normal limits.  Her abdominal pain may have been due to her pseudocyst formation or she may have had a symptomatic gallstone. She was thought previously to have had episodes of symptomatic gallstones and had been scheduled to have an outpatient appointment with Gen. surgery for consultation for cholecystectomy. She was seen as an inpatient by general surgery to determine if they would be willing to undergo cholecystectomy as an inpatient, however, because of her recent stroke, they did not feel comfortable with surgery at this time.  She will have followup with her neurologist and with cardiology for preoperative assessment prior to her August surgical appointment.    Cholelithiasis, liver functions were normal. She will have outpatient cholecystectomy a later date.  See above.  Possible urinary tract infection, she was started on empiric ciprofloxacin and on July 15, however her urine culture only grew 50,000 colonies of Escherichia coli. The patient states that she was asymptomatic, so her antibiotics were discontinued. If she develops increasing evidence of urinary tract infection such as urgency, frequency, or dysuria, she should be start Keflex. She has a known intolerance to cephalosporins with nausea, however, the alternatives, because of the drug resistant patterns of Escherichia coli, would be IV.  Acute kidney injury, likely due to dehydration from abdominal pain and poor PO intake.  Peak creatinine of 1.7.  Improved with IVF.  Creatinine will need to be rechecked as an outpatient in 1 week.    Hypertension, systolic blood pressures were persistently mildly elevated so I increased her labetalol from 100mg  BID to 200mg  BID.  She should have a follow up appointment with her primary care doctor in the next few weeks to make sure that her blood pressure is controlled.   Hypothyroidism, stable, continued levothyroxine.  Anemia due to B12 deficiency and chronic disease. Her hemoglobin gradually trended down to her baseline from May of this year. She continued her B12 injections and should continue her violation for her anemia with her primary care doctor.  GERD, stable. She continued PPI.  History of stroke in May of 2014 resulting in dysarthria and right upper and lower extremity weakness. She continued her aspirin. She should followup with cardiology for telemetry monitoring. Should also followup with neurology. She was supposed to followup with neurology approximately one month after discharge, however, she was unaware of this appointment so it has not occurred yet. She was given new  information for both cardiology and neurology prior to discharge.  History of pancreatitis with complex and progressive pseudocyst involving the body and tail of the pancreas.  She is followed by gastroenterology.    Consultants:  GI- [place]  General surgery Procedures:  CT scan of abdomen and pelvis  MRI of the abdomen and pelvis Antibiotics:  cipro 7/15 to 7/17   Discharge Exam: Filed Vitals:   03/21/13 0740  BP:   Pulse: 88  Temp: 98.4 F (36.9 C)  Resp: 20   Filed Vitals:   03/20/13 1813 03/20/13 2049 03/21/13 0425 03/21/13 0740  BP: 156/49 184/61 163/65   Pulse: 69 84 65 88  Temp: 99.2 F (37.3 C) 98.1 F (36.7 C) 99.1 F (37.3 C) 98.4 F (36.9 C)  TempSrc: Oral Oral Oral Oral  Resp: 20 18 19 20   Height:      Weight:      SpO2: 98% 99% 95% 95%   Denies abdominal pain. States she feels well.  Denies dysuria, urgency, frequency.  General: Caucasian female, alert & oriented x 3, in NAD  HEENT: MMM, NCAT  Cardiovascular: RRR, nl S1 s2, 2+ pulses, warm extremities  Respiratory: CTAB  Abdomen: soft +BS, nondistended nontender  Extremities:  No cyanosis and trace ankle edema, normal tone and bulk  Neuro:  Mild dysarthria, strength 4/5 right upper and right lower exterminate, and chronic, stable. Mild dysmetria of the right upper extremity.      Discharge Instructions      Discharge Orders   Future Appointments Provider Department Dept Phone   04/09/2013 10:30 AM Stark Klein, MD [facility], [place] [phone]   Future Orders Complete By Expires     Call MD for:  difficulty breathing, headache or visual disturbances  As directed     Call MD for:  extreme fatigue  As directed     Call MD for:  hives  As directed     Call MD for:  persistant dizziness or light-headedness  As directed     Call MD for:  persistant nausea and vomiting  As directed     Call MD for:  severe uncontrolled pain  As directed     Call MD for:  temperature >100.4  As directed     Diet general  As directed     Discharge instructions  As directed     Comments:      You were hospitalized with abdominal pain which may have been from a gallstone or from  your pseudocyst. You were seen by gastroenterology and by general surgery.  Fortunately, your abdominal pain improved on its own.  You will need to be seen by cardiology and by neurology prior to having your gallbladder removed.  Please call to schedule these appointments for as soon as possible. You will follow up with the general surgeons next month.  Because you had possible evidence of developing urinary tract infection, you have been given a prescription for antibiotics.  If you develop pain when you urinate, increased frequency or urgency of urination, please start taking the antibiotic.  Your blood pressure was a little elevated during this admission, so I have increased your blood pressure medication slightly.  Please follow up with your primary care doctor in 1-2 weeks to have your blood pressure rechecked.    Increase activity slowly  As directed         Medication List         aspirin 325 MG tablet  Take  1 tablet (325 mg total) by mouth daily.     cephALEXin 500 MG capsule  Commonly known as:  KEFLEX  Take 1 capsule (500 mg total) by mouth every 12 (twelve) hours.     cholecalciferol 1000 UNITS tablet  Commonly known as:  VITAMIN D  Take 1,000 Units by mouth daily.     cyanocobalamin 1000 MCG/ML injection  Commonly known as:  (VITAMIN B-12)  Inject 1 mL (1,000 mcg total) into the skin every 14 (fourteen) days.     folic acid A999333 MCG tablet  Commonly known as:  FOLVITE  Take 400 mcg by mouth daily.     food thickener Powd  Commonly known as:  THICK IT  Thick all liquids     labetalol 200 MG tablet  Commonly known as:  NORMODYNE  Take 1 tablet (200 mg total) by mouth 2 (two) times daily.     levothyroxine 88 MCG tablet  Commonly known as:  SYNTHROID, LEVOTHROID  Take 88 mcg by mouth daily before breakfast.     omeprazole 40 MG capsule  Commonly known as:  PRILOSEC  Take 1 capsule (40 mg total) by mouth daily.     traMADol 50 MG tablet  Commonly known as:  ULTRAM   Take 1 tablet (50 mg total) by mouth 2 (two) times daily as needed for pain.     zaleplon 10 MG capsule  Commonly known as:  SONATA  Take 10 mg by mouth at bedtime.       Follow-up Information   Follow up with [facility]). Schedule an appointment as soon as possible for a visit in 1 month.   Contact information:   [address], [place] [address] [place] [address] [phone]      Follow up with Forbes Cellar, MD. Schedule an appointment as soon as possible for a visit in 1 month.   Contact information:   [address] [place] [address] [phone]       Follow up with Walker Kehr, MD. Schedule an appointment as soon as possible for a visit in 2 weeks.   Contact information:   [address] [place] [address] [phone]        The results of significant diagnostics from this hospitalization (including imaging, microbiology, ancillary and laboratory) are listed below for reference.    Significant Diagnostic Studies: Ct Abdomen Pelvis Wo Contrast  03/18/2013   *RADIOLOGY REPORT*  Clinical Data: Mid abdominal pain.  History of pancreatitis.  CT ABDOMEN AND PELVIS WITHOUT CONTRAST  Technique:  Multidetector CT imaging of the abdomen and pelvis was performed following the standard protocol without intravenous contrast.  Comparison: Abdominal pelvic CT 03/01/2010 and 01/06/2013.  Findings: There is stable left basilar and lingular scarring.  No significant pleural or pericardial effusion is present.  There is a small hiatal hernia.  An 11 mm calcified gallstone is again noted.  There is no gallbladder wall thickening or biliary dilatation.  The liver, spleen, adrenal glands and kidneys demonstrate no significant findings.  There has been interval development of marked diffuse enlargement of the pancreas, measuring up to 10.0 x 3.8 cm transverse.  This enlargement primarily involves the pancreatic  body and tail and is associated with multiple new irregular nodular soft tissue densities in the surrounding retroperitoneal fat, extending inferiorly along the left paracolic gutter and base of the mesentery.  The soft tissue densities appear nodular rather than  tubular on this noncontrasted study. There is a dominant 1.9 cm nodular density within the left upper quadrant omentum on image number 17.  There is no generalized ascites.  The stomach and small bowel appear normal.  The appendix is not visualized.  There are diverticular changes of the sigmoid colon without surrounding inflammation.  The uterus is surgically absent. There is no adnexal mass.  The bladder is nearly empty and suboptimally evaluated.  There is mild aorto iliac atherosclerosis.  Postsurgical changes are present within the lumbar spine status post laminectomy and PLIF at L4-L5.  There are no acute osseous findings.  IMPRESSION:  1.  Interval development of extensive retroperitoneal nodularity asymmetric to the left, concerning for peritoneal carcinomatosis. There is no significant associated ascites.  Because these densities appear nodular rather than tubular, collateral vessel formation is considered less likely. The splenic vein was patent on the prior CT performed with contrast. 2.  Interval diffuse enlargement of the pancreatic body and tail, likely pancreatic necrosis and developing pseudocyst formation given the Masiah Lewing interval since the prior study. Underlying pancreatic malignancy cannot be excluded.  MRI may be helpful for further evaluation, especially if the patient is able to receive contrast (the patient has renal insufficiency), prior to tissue sampling.  These results will be called to the ordering clinician or representative by the Radiologist Assistant, and communication documented in the PACS Dashboard.   Original Report Authenticated By: Richardean Sale, M.D.   Mr Abdomen W Wo Contrast  03/20/2013   *RADIOLOGY REPORT*   Clinical Data: Retroperitoneal lesions with question of pancreatic necrosis.  MRI ABDOMEN WITH AND WITHOUT CONTRAST  Technique:  Multiplanar multisequence MR imaging of the abdomen was performed both before and after administration of intravenous contrast.  Contrast: 38mL MULTIHANCE GADOBENATE DIMEGLUMINE 529 MG/ML IV SOLN  Comparison: CT scan from 03/18/2013 and 01/06/2013.  CT scan from 03/01/2010.  Findings: As seen on the recent CT scan, there is a large, complex cystic lesion in the body and tail of the pancreas.  The largest measures approximately 3.0 by the 2.26 cm.  The postcontrast sequences are markedly degraded by patient motion, effectively obscuring anatomic detail of the pancreas.  For this reason, further characterization of the cystic lesion and its enhancement characteristics is not possible.  The lesion does not show substantial gross enhancement may be related to massive dilatation of the main duct or cystic change within the pancreatic parenchyma secondary to inflammation or necrosis.  No focal abnormalities seen in the liver or spleen.  There is no intra or extrahepatic biliary duct dilatation.  12 mm gallstone is seen in the region of the gallbladder neck.  Stomach is unremarkable.  No splenic lesion.  There is no adrenal nodule.  Small bilateral renal cortical cysts are evident.  The abnormal nodular soft tissue identified in the peripancreatic space and tracking towards and into the left paracolic gutter is again identified.  There is certainly associated vascular collateralization in the left abdomen (see image 60 of series 1501).  No abdominal aortic aneurysm.  No evidence for intraperitoneal free fluid.  The patient is status post previous lumbar fusion.  IMPRESSION: Limited study secondary to the patient's inability to reproducibly breath hold.  As noted on the previous CT scan, there is a large complex cystic mass essentially expanding and replacing the body and tail the pancreas.  Given  the relatively rapid appearance over a 48-month interval and the lack of gross enhancement, it likely reflects an evolving pancreatic pseudocyst/sequelae of  necrosis. However, neoplasm cannot be excluded, especially in light of the mesenteric and peritoneal findings.  Peripancreatic retroperitoneal, mesenteric, and peritoneal nodular soft tissue tracks towards and involves the left paracolic gutter. There is certainly associated vascular collateralization in the left side of the abdomen, in the region of the left colon, and the abnormal soft tissue is concerning for peritoneal neoplasm.  Endoscopic ultrasound may prove helpful to further evaluate.  PET CT may also provide additional characterization.   Original Report Authenticated By: Misty Stanley, M.D.    Microbiology: Recent Results (from the past 240 hour(s))  URINE CULTURE     Status: None   Collection Time    03/18/13 12:46 AM      Result Value Range Status   Specimen Description URINE, CLEAN CATCH   Final   Special Requests NONE   Final   Culture  Setup Time 03/19/2013 00:46   Final   Colony Count 50,000 COLONIES/ML   Final   Culture ESCHERICHIA COLI   Final   Report Status 03/20/2013 FINAL   Final   Organism ID, Bacteria ESCHERICHIA COLI   Final     Labs: Basic Metabolic Panel:  Recent Labs Lab 03/18/13 1033 03/18/13 1830 03/19/13 0540 03/20/13 0625 03/21/13 0505  NA 137 135 136 141 140  K 4.8 4.1 3.8 4.2 4.1  CL 103 99 104 110 109  CO2 25 26 22 22 22   GLUCOSE 117* 131* 102* 103* 107*  BUN 18 19 18 13 11   CREATININE 1.7* 1.73* 1.63* 1.46* 1.39*  CALCIUM 9.5 9.3 8.6 8.8 8.9   Liver Function Tests:  Recent Labs Lab 03/18/13 1033 03/18/13 1830 03/19/13 0540 03/21/13 0505  AST 20 17 15 16   ALT 14 12 9 9   ALKPHOS 52 63 52 52  BILITOT 0.5 0.3 0.3 0.2*  PROT 7.9 7.6 6.3 6.0  ALBUMIN 3.5 3.2* 2.7* 2.7*    Recent Labs Lab 03/18/13 1033 03/18/13 1830  LIPASE 29.0 37  AMYLASE 49  --    No results found for this  basename: AMMONIA,  in the last 168 hours CBC:  Recent Labs Lab 03/18/13 1033 03/18/13 1830 03/19/13 0540 03/21/13 0505  WBC 6.4 5.0 5.1 4.6  NEUTROABS  --  2.2  --   --   HGB 10.2* 9.7* 8.9* 8.6*  HCT 29.9* 28.7* 26.4* 25.7*  MCV 92.8 91.4 90.1 91.5  PLT 239.0 219 196 177   Cardiac Enzymes: No results found for this basename: CKTOTAL, CKMB, CKMBINDEX, TROPONINI,  in the last 168 hours BNP: BNP (last 3 results) No results found for this basename: PROBNP,  in the last 8760 hours CBG: No results found for this basename: GLUCAP,  in the last 168 hours  Time coordinating discharge: 45 minutes  Signed:  Normajean Nash  [facility] 03/21/2013, 11:53 AM

## 2013-03-21 NOTE — Progress Notes (Signed)
[facility] Progress Note   Subjective  Had mild left sided abdominal pain this am but resolved and she thinks it was gas. Otherwise feels okay   Objective   Vital signs in last 24 hours: Temp:  [97.9 F (36.6 C)-99.2 F (37.3 C)] 98.4 F (36.9 C) (07/17 0740) Pulse Rate:  [64-88] 88 (07/17 0740) Resp:  [18-20] 20 (07/17 0740) BP: (149-184)/(49-65) 163/65 mmHg (07/17 0425) SpO2:  [95 %-99 %] 95 % (07/17 0740) Last BM Date: 03/18/13 General:    white female in NAD Abdomen:  Soft, nondistended, mild to moderate epigastric and LUQ tenderness. Normal bowel sounds. Neurologic:  Alert and oriented,  grossly normal neurologically. Psych:  Cooperative. Normal mood and affect.  Lab Results:  Recent Labs  03/18/13 1830 03/19/13 0540 03/21/13 0505  WBC 5.0 5.1 4.6  HGB 9.7* 8.9* 8.6*  HCT 28.7* 26.4* 25.7*  PLT 219 196 177   BMET  Recent Labs  03/19/13 0540 03/20/13 0625 03/21/13 0505  NA 136 141 140  K 3.8 4.2 4.1  CL 104 110 109  CO2 22 22 22   GLUCOSE 102* 103* 107*  BUN 18 13 11   CREATININE 1.63* 1.46* 1.39*  CALCIUM 8.6 8.8 8.9   LFT  Recent Labs  03/21/13 0505  PROT 6.0  ALBUMIN 2.7*  AST 16  ALT 9  ALKPHOS 52  BILITOT 0.2*   PT/INR  Recent Labs  03/19/13 0540  LABPROT 13.8  INR 1.08    Studies/Results: Mr Abdomen W Wo Contrast  03/20/2013   *RADIOLOGY REPORT*  Clinical Data: Retroperitoneal lesions with question of pancreatic necrosis.  MRI ABDOMEN WITH AND WITHOUT CONTRAST  Technique:  Multiplanar multisequence MR imaging of the abdomen was performed both before and after administration of intravenous contrast.  Contrast: 92mL MULTIHANCE GADOBENATE DIMEGLUMINE 529 MG/ML IV SOLN  Comparison: CT scan from 03/18/2013 and 01/06/2013.  CT scan from 03/01/2010.  Findings: As seen on the recent CT scan, there is a large, complex cystic lesion in the body and tail of the pancreas.  The largest measures approximately 3.0 by the 2.26 cm.  The  postcontrast sequences are markedly degraded by patient motion, effectively obscuring anatomic detail of the pancreas.  For this reason, further characterization of the cystic lesion and its enhancement characteristics is not possible.  The lesion does not show substantial gross enhancement may be related to massive dilatation of the main duct or cystic change within the pancreatic parenchyma secondary to inflammation or necrosis.  No focal abnormalities seen in the liver or spleen.  There is no intra or extrahepatic biliary duct dilatation.  12 mm gallstone is seen in the region of the gallbladder neck.  Stomach is unremarkable.  No splenic lesion.  There is no adrenal nodule.  Small bilateral renal cortical cysts are evident.  The abnormal nodular soft tissue identified in the peripancreatic space and tracking towards and into the left paracolic gutter is again identified.  There is certainly associated vascular collateralization in the left abdomen (see image 60 of series 1501).  No abdominal aortic aneurysm.  No evidence for intraperitoneal free fluid.  The patient is status post previous lumbar fusion.  IMPRESSION: Limited study secondary to the patient's inability to reproducibly breath hold.  As noted on the previous CT scan, there is a large complex cystic mass essentially expanding and replacing the body and tail the pancreas.  Given the relatively rapid appearance over a 21-month interval and the lack of gross enhancement, it likely reflects an  evolving pancreatic pseudocyst/sequelae of necrosis. However, neoplasm cannot be excluded, especially in light of the mesenteric and peritoneal findings.  Peripancreatic retroperitoneal, mesenteric, and peritoneal nodular soft tissue tracks towards and involves the left paracolic gutter. There is certainly associated vascular collateralization in the left side of the abdomen, in the region of the left colon, and the abnormal soft tissue is concerning for peritoneal  neoplasm.  Endoscopic ultrasound may prove helpful to further evaluate.  PET CT may also provide additional characterization.   Original Report Authenticated By: Misty Stanley, M.D.     Assessment / Plan:     1. Acute upper abdominal pain, resolved several days ago. Noncontrast CTscan reveals marked diffuse enlargement of pancreas associated with multiple new irregular nodules in retroperitoneal fat. MRI suggests evolving pseudocysts but neoplasm not excluded given mesenteric/ peritoneal soft tissue densities. CA 19-9 and CEA are normal. Patient had acute pancreatitis in May (presumed biliary). Hopefully MRI findings sequelae of prior pancreatitis but she may need EUS at some point. See #3    3. GERD, continue PPI. She is on 80mg  daily, will change to 40mg  BID  4. Cholelithiasis. A calcified 57mm gallstone seen on CTscan. Surgery following. and would like stroke team and cardiology to evaluate regarding her risk for general anesthesia as she had a CVA in May. From what I can ascertain, a TEE was never able to be completed.   5. Acute renal failure.  6. Normocytic anemia, hgb fell in May from her baseline of 11 to around 7, probably from aggressive hydration with pancreatitis. Upon this admission hgb up to 10.2 but down again to 8.6 (probably some hemodilution from IV hydration). No overt GI bleeding.   I updated patient's daughter Sondra Barges over telephone per patient's request.    LOS: 3 days   Tye Savoy  03/21/2013, 11:08 AM  I have personally taken an interval history, reviewed the chart, and examined the patient. Appreciate surgery's assistance.  No need for further GI studies at this time.  Would f/u MRI in about 3 months, undergo cholecystectomy when deemed medically stable.  Signing off.  Sandy Salaam. Deatra Ina, MD, [facility] [phone]

## 2013-03-21 NOTE — Progress Notes (Signed)
Subjective: She complains of some nausea today but denies abdominal pain  Objective: Vital signs in last 24 hours: Temp:  [97.9 F (36.6 C)-99.2 F (37.3 C)] 98.4 F (36.9 C) (07/17 0740) Pulse Rate:  [64-88] 88 (07/17 0740) Resp:  [18-20] 20 (07/17 0740) BP: (145-184)/(47-65) 163/65 mmHg (07/17 0425) SpO2:  [95 %-99 %] 95 % (07/17 0740) Last BM Date: 03/18/13  Intake/Output from previous day: 07/16 0701 - 07/17 0700 In: 1362 [P.O.:720; I.V.:642] Out: 1000 [Urine:1000] Intake/Output this shift:    GI: soft, mild central tenderness  Lab Results:   Recent Labs  03/19/13 0540 03/21/13 0505  WBC 5.1 4.6  HGB 8.9* 8.6*  HCT 26.4* 25.7*  PLT 196 177   BMET  Recent Labs  03/20/13 0625 03/21/13 0505  NA 141 140  K 4.2 4.1  CL 110 109  CO2 22 22  GLUCOSE 103* 107*  BUN 13 11  CREATININE 1.46* 1.39*  CALCIUM 8.8 8.9   PT/INR  Recent Labs  03/19/13 0540  LABPROT 13.8  INR 1.08   ABG No results found for this basename: PHART, PCO2, PO2, HCO3,  in the last 72 hours  Studies/Results: Mr Abdomen W Wo Contrast  03/20/2013   *RADIOLOGY REPORT*  Clinical Data: Retroperitoneal lesions with question of pancreatic necrosis.  MRI ABDOMEN WITH AND WITHOUT CONTRAST  Technique:  Multiplanar multisequence MR imaging of the abdomen was performed both before and after administration of intravenous contrast.  Contrast: 59mL MULTIHANCE GADOBENATE DIMEGLUMINE 529 MG/ML IV SOLN  Comparison: CT scan from 03/18/2013 and 01/06/2013.  CT scan from 03/01/2010.  Findings: As seen on the recent CT scan, there is a large, complex cystic lesion in the body and tail of the pancreas.  The largest measures approximately 3.0 by the 2.26 cm.  The postcontrast sequences are markedly degraded by patient motion, effectively obscuring anatomic detail of the pancreas.  For this reason, further characterization of the cystic lesion and its enhancement characteristics is not possible.  The lesion does  not show substantial gross enhancement may be related to massive dilatation of the main duct or cystic change within the pancreatic parenchyma secondary to inflammation or necrosis.  No focal abnormalities seen in the liver or spleen.  There is no intra or extrahepatic biliary duct dilatation.  12 mm gallstone is seen in the region of the gallbladder neck.  Stomach is unremarkable.  No splenic lesion.  There is no adrenal nodule.  Small bilateral renal cortical cysts are evident.  The abnormal nodular soft tissue identified in the peripancreatic space and tracking towards and into the left paracolic gutter is again identified.  There is certainly associated vascular collateralization in the left abdomen (see image 60 of series 1501).  No abdominal aortic aneurysm.  No evidence for intraperitoneal free fluid.  The patient is status post previous lumbar fusion.  IMPRESSION: Limited study secondary to the patient's inability to reproducibly breath hold.  As noted on the previous CT scan, there is a large complex cystic mass essentially expanding and replacing the body and tail the pancreas.  Given the relatively rapid appearance over a 33-month interval and the lack of gross enhancement, it likely reflects an evolving pancreatic pseudocyst/sequelae of necrosis. However, neoplasm cannot be excluded, especially in light of the mesenteric and peritoneal findings.  Peripancreatic retroperitoneal, mesenteric, and peritoneal nodular soft tissue tracks towards and involves the left paracolic gutter. There is certainly associated vascular collateralization in the left side of the abdomen, in the region of the  left colon, and the abnormal soft tissue is concerning for peritoneal neoplasm.  Endoscopic ultrasound may prove helpful to further evaluate.  PET CT may also provide additional characterization.   Original Report Authenticated By: Misty Stanley, M.D.    Anti-infectives: Anti-infectives   Start     Dose/Rate Route  Frequency Ordered Stop   03/19/13 0800  ciprofloxacin (CIPRO) tablet 250 mg     250 mg Oral 2 times daily 03/19/13 [id number] 03/22/13 0759      Assessment/Plan: s/p * No surgery found * I think she needs to be evaluated by the stroke team and cardiology to quantitate what her risk would be to undergo general anesthesia at this point in her stroke recovery.  She is unaware of who her doctors are in these specialties so at a minimum she needs this information if she is being discharged in the near future. We will follow. She will probably benefit from having her gallbladder removed at some point but the timing of it is the main concern  LOS: 3 days    TOTH III,Marcial Pless S 03/21/2013

## 2013-03-21 NOTE — Care Management Note (Addendum)
   CARE MANAGEMENT NOTE 03/21/2013  Patient:  [name] [name], [name] [name]   Account Number:  [id number]  Date Initiated:  03/21/2013  Documentation initiated by:  Bernis Schreur  Subjective/Objective Assessment:   Order for [facility] vs outpatient rehab.     Action/Plan:   Met with pt and daughter re d/c needs, pt was ready to go to outpatient rehab prior to this hospitalization, @ [facility]. Both pt and daughter feel that pt is able to continue with plan for outpatient rehab, will reschedule.   Anticipated DC Date:  03/21/2013   Anticipated DC Plan:  HOME/SELF CARE         Choice offered to / List presented to:             Status of service:  Completed, signed off Medicare Important Message given?   (If response is "NO", the following Medicare IM given date fields will be blank) Date Medicare IM given:   Date Additional Medicare IM given:    Discharge Disposition:  HOME/SELF CARE  Per UR Regulation:    If discussed at Long Length of Stay Meetings, dates discussed:    Comments:  03/21/2013 Met with pt and daughter re d/c needs, has alll DME needed at this time, has completed HHPT/OT and was to begin outpatient rehab at [facility]. Daughter has cancelled appointment due to hospitalization and has 30 days to reschedule , she will do this right away as she and pt both state that they believe that pt is able to proceed with outpatient services. Jasmine Pang RN MPH, case manager, [phone]

## 2013-03-22 ENCOUNTER — Telehealth: Payer: Self-pay | Admitting: Internal Medicine

## 2013-03-22 ENCOUNTER — Telehealth: Payer: Self-pay | Admitting: Gastroenterology

## 2013-03-22 DIAGNOSIS — R933 Abnormal findings on diagnostic imaging of other parts of digestive tract: Secondary | ICD-10-CM

## 2013-03-22 NOTE — Telephone Encounter (Signed)
[name] [name] with OP Rehab at [facility] called requesting order for outpatient PT, OT, and speech therapy, S/P stroke.  She needs the order today.  See telephone note dated 03/14/2013.

## 2013-03-22 NOTE — Telephone Encounter (Signed)
Arbie Cookey called from [facility] to check up on the order request for PT, OT and Speech therapist. Please advise. Arbie Cookey stated that pt is coming in on Monday 03/25/13 and they can not treat pt until they get the order from Dr. Camila Li. Please see prior phone note.

## 2013-03-22 NOTE — Telephone Encounter (Signed)
Ok Thx 

## 2013-03-22 NOTE — Telephone Encounter (Signed)
Pt seen by Dr Deatra Ina in [facility] and daughter would like a f/u; given appt for 04/05/13.

## 2013-03-25 ENCOUNTER — Telehealth: Payer: Self-pay | Admitting: *Deleted

## 2013-03-25 ENCOUNTER — Telehealth: Payer: Self-pay | Admitting: Neurology

## 2013-03-25 NOTE — Telephone Encounter (Signed)
Notified Trinna Post as per MDs order

## 2013-03-25 NOTE — Telephone Encounter (Signed)
Trinna Post, pts daughter called states pt blood pressure has been running low 90/38 since increase of Labatolol 200mg  BID.  She further states she decreased the Labatolol to 100mg  BID and BP increased to 126/62.  Please advise [place].

## 2013-03-25 NOTE — Telephone Encounter (Signed)
Note written and signed.  Fax received by [facility].

## 2013-03-25 NOTE — Telephone Encounter (Signed)
Pls continue Labatolol to 100mg  BID  Thx

## 2013-04-01 ENCOUNTER — Encounter: Payer: Self-pay | Admitting: Internal Medicine

## 2013-04-01 ENCOUNTER — Ambulatory Visit (INDEPENDENT_AMBULATORY_CARE_PROVIDER_SITE_OTHER): Payer: Medicare Other | Admitting: Internal Medicine

## 2013-04-01 ENCOUNTER — Other Ambulatory Visit (INDEPENDENT_AMBULATORY_CARE_PROVIDER_SITE_OTHER): Payer: Medicare Other

## 2013-04-01 VITALS — BP 128/66 | HR 72 | Temp 98.3°F | Resp 16 | Wt 139.0 lb

## 2013-04-01 DIAGNOSIS — Z8673 Personal history of transient ischemic attack (TIA), and cerebral infarction without residual deficits: Secondary | ICD-10-CM

## 2013-04-01 DIAGNOSIS — M79609 Pain in unspecified limb: Secondary | ICD-10-CM

## 2013-04-01 DIAGNOSIS — E039 Hypothyroidism, unspecified: Secondary | ICD-10-CM

## 2013-04-01 DIAGNOSIS — K8020 Calculus of gallbladder without cholecystitis without obstruction: Secondary | ICD-10-CM

## 2013-04-01 DIAGNOSIS — D649 Anemia, unspecified: Secondary | ICD-10-CM

## 2013-04-01 DIAGNOSIS — I639 Cerebral infarction, unspecified: Secondary | ICD-10-CM

## 2013-04-01 DIAGNOSIS — M79604 Pain in right leg: Secondary | ICD-10-CM | POA: Insufficient documentation

## 2013-04-01 DIAGNOSIS — E538 Deficiency of other specified B group vitamins: Secondary | ICD-10-CM

## 2013-04-01 DIAGNOSIS — I6350 Cerebral infarction due to unspecified occlusion or stenosis of unspecified cerebral artery: Secondary | ICD-10-CM

## 2013-04-01 DIAGNOSIS — Z8719 Personal history of other diseases of the digestive system: Secondary | ICD-10-CM

## 2013-04-01 DIAGNOSIS — R002 Palpitations: Secondary | ICD-10-CM

## 2013-04-01 LAB — HEPATIC FUNCTION PANEL
ALT: 13 U/L (ref 0–35)
AST: 17 U/L (ref 0–37)
Bilirubin, Direct: 0.1 mg/dL (ref 0.0–0.3)
Total Bilirubin: 0.4 mg/dL (ref 0.3–1.2)
Total Protein: 7.8 g/dL (ref 6.0–8.3)

## 2013-04-01 LAB — BASIC METABOLIC PANEL
BUN: 22 mg/dL (ref 6–23)
CO2: 27 mEq/L (ref 19–32)
Calcium: 9.4 mg/dL (ref 8.4–10.5)
Glucose, Bld: 124 mg/dL — ABNORMAL HIGH (ref 70–99)
Potassium: 4.7 mEq/L (ref 3.5–5.1)
Sodium: 138 mEq/L (ref 135–145)

## 2013-04-01 LAB — CBC WITH DIFFERENTIAL/PLATELET
Basophils Relative: 0.6 % (ref 0.0–3.0)
Eosinophils Relative: 7.3 % — ABNORMAL HIGH (ref 0.0–5.0)
HCT: 27.8 % — ABNORMAL LOW (ref 36.0–46.0)
Lymphs Abs: 2.1 10*3/uL (ref 0.7–4.0)
MCV: 92.1 fl (ref 78.0–100.0)
Monocytes Relative: 9.6 % (ref 3.0–12.0)
Platelets: 207 10*3/uL (ref 150.0–400.0)
RBC: 3.02 Mil/uL — ABNORMAL LOW (ref 3.87–5.11)
WBC: 5.6 10*3/uL (ref 4.5–10.5)

## 2013-04-01 MED ORDER — LABETALOL HCL 200 MG PO TABS: 200.0000 mg | ORAL_TABLET | Freq: Two times a day (BID) | ORAL | Status: DC

## 2013-04-01 MED ORDER — OMEPRAZOLE 40 MG PO CPDR: 40.0000 mg | DELAYED_RELEASE_CAPSULE | Freq: Every day | ORAL | Status: DC

## 2013-04-01 MED ORDER — ZALEPLON 10 MG PO CAPS: 10.0000 mg | ORAL_CAPSULE | Freq: Every day | ORAL | Status: DC

## 2013-04-01 NOTE — Assessment & Plan Note (Signed)
Recovering Continue with current prescription therapy as reflected on the Med list.

## 2013-04-01 NOTE — Assessment & Plan Note (Signed)
labs

## 2013-04-01 NOTE — Assessment & Plan Note (Addendum)
appt w/Dr Harrington Challenger is pending

## 2013-04-01 NOTE — Assessment & Plan Note (Signed)
D-dimer 

## 2013-04-01 NOTE — Assessment & Plan Note (Signed)
Dr Leonie Man

## 2013-04-01 NOTE — Assessment & Plan Note (Signed)
surg appt is delayed due to h/o recent CVA (01/2013)

## 2013-04-01 NOTE — Progress Notes (Signed)
Subjective:    HPI  7/14 - recent d/c:  [name] [name] is a 79 y.o. Caucasian female with history of hypertension, hyperlipidemia, hypothyroidism, anemia, recent history of CVA in May 2014 with dysarthria and right-sided deficits, recent history of pancreatitis due to common bile duct stone which has passed, history of cholelithiasis, acute renal failure, dysphagia due to CVA, and hypertrophic cardiomyopathy who presents with the above complaints. Patient had a prolonged hospital stay from 01/07/2013 to 01/23/2013 due to CVA, acute pancreatitis from cholelithiasis with stone passing in her common bile duct. Hospital course was complicated by acute renal failure. Patient also had acute respiratory failure requiring intubation and mechanical ventilation from 01/09/2013 to 01/10/2013. Hospital course was also complicated by pneumonia. After discharge patient went to skilled nursing facility at [facility]. She was discharged home 2 weeks ago and has been living with her daughter. She has made good progress at skilled nursing facility and at home, she had improvement in her right side from her stroke. Her speech and also has improved. Yesterday morning and during the night patient had epigastric pain which was sharp. The pain was radiating to her left upper quadrant. Patient did not have any nausea or vomiting. However due to her history of cholelithiasis and pancreatitis from choledocholithiasis, she called her primary care physician who did labs and CT of her abdomen pelvis without contrast. Lab showed she was in acute renal failure with creatinine of 1.7, and CT of abdomen and pelvis without contrast showed interval development of extensive retroperitoneal nodularity concerning for peritoneal carcinomatosis versus reactive changes from recent pancreatitis. CT also showed interval diffuse enlargement of pancreatic body and tail and likely pancreatic necrosis with developing pseudocyst. Hospitalist service was  asked to admit the patient for further care and management. Patient denies any recent fevers but has chills at home. Denies any chest pain, shortness of breath, diarrhea, headaches or new vision changes. Currently denies any abdominal pain. Patient admits that she does not drink adequate fluids at home to hydrate herself.  Hospital Course:  [name] [name] was admitted with abdominal pain. She underwent a CT scan without contrast because of the mild acute kidney injury which demonstrated diffuse enlargement of the pancreatic body and tail with pancreatic necrosis and evolving pseudocyst, and extensive retroperitoneal nodularity. She was seen by gastroenterology who recommended a followup MRI area her MRI of abdomen and pelvis demonstrated peritoneal, mesenteric, and peritoneal nodular soft tissues concerning for possible underlying malignancy. The gastroenterologist reviewed these findings with the radiologist and because of the lack of enhancement and the fact that she has considerable intra-abdominal inflammation from her recent pancreatitis, they felt that these nodules are more likely related to inflammatory changes and less likely related to neoplasm. Radiology recommended possibly a PET CT or endoscopic ultrasound to further characterize the lesions, however, PET CT would not be able to distinguish inflammation from malignancy, and gastroenterology did not think that they would be able to characterize the lesions further at this time with endoscopic ultrasound. They recommended repeating the MRI in approximately 3 months. If there is evolution concerning for possible malignancy, shaking and undergo further workup at that time. At this time she does not have any other signs of malignancy such as unexplained weight loss, changes to her bowel habits. Her CEA and CA 19-9 were both within normal limits. Her abdominal pain may have been due to her pseudocyst formation or she may have had a symptomatic gallstone. She  was thought previously to have had episodes  of symptomatic gallstones and had been scheduled to have an outpatient appointment with Gen. surgery for consultation for cholecystectomy. She was seen as an inpatient by general surgery to determine if they would be willing to undergo cholecystectomy as an inpatient, however, because of her recent stroke, they did not feel comfortable with surgery at this time. She will have followup with her neurologist and with cardiology for preoperative assessment prior to her August surgical appointment.  Cholelithiasis, liver functions were normal. She will have outpatient cholecystectomy a later date. See above.  Possible urinary tract infection, she was started on empiric ciprofloxacin and on July 15, however her urine culture only grew 50,000 colonies of Escherichia coli. The patient states that she was asymptomatic, so her antibiotics were discontinued. If she develops increasing evidence of urinary tract infection such as urgency, frequency, or dysuria, she should be start Keflex. She has a known intolerance to cephalosporins with nausea, however, the alternatives, because of the drug resistant patterns of Escherichia coli, would be IV.  Acute kidney injury, likely due to dehydration from abdominal pain and poor PO intake. Peak creatinine of 1.7. Improved with IVF. Creatinine will need to be rechecked as an outpatient in 1 week.  Hypertension, systolic blood pressures were persistently mildly elevated so I increased her labetalol from 100mg  BID to 200mg  BID. She should have a follow up appointment with her primary care doctor in the next few weeks to make sure that her blood pressure is controlled.  Hypothyroidism, stable, continued levothyroxine.  Anemia due to B12 deficiency and chronic disease. Her hemoglobin gradually trended down to her baseline from May of this year. She continued her B12 injections and should continue her violation for her anemia with her primary  care doctor.  GERD, stable. She continued PPI.  History of stroke in May of 2014 resulting in dysarthria and right upper and lower extremity weakness. She continued her aspirin. She should followup with cardiology for telemetry monitoring. Should also followup with neurology. She was supposed to followup with neurology approximately one month after discharge, however, she was unaware of this appointment so it has not occurred yet. She was given new information for both cardiology and neurology prior to discharge.  History of pancreatitis with complex and progressive pseudocyst involving the body and tail of the pancreas. She is followed by gastroenterology.  Consultants:  GI- [facility]  General surgery Procedures:  CT scan of abdomen and pelvis  MRI of the abdomen and pelvis Antibiotics:  cipro 7/15 to 7/17    F/u abd pain and cholelithiasis/pancreatitis/CVA  The patient is here to follow up on chronic depression, anxiety, headaches and chronic moderate fibromyalgia symptoms controlled with medicines F/u GERD, fatigue, gammopathy  Wt Readings from Last 3 Encounters:  04/01/13 139 lb (63.05 kg)  03/19/13 134 lb 4.2 oz (60.901 kg)  03/18/13 139 lb (63.05 kg)   BP Readings from Last 3 Encounters:  04/01/13 128/66  03/21/13 163/65  03/18/13 128/62     Review of Systems  Constitutional: Positive for fatigue. Negative for activity change, appetite change and unexpected weight change.  HENT: Negative for congestion, mouth sores and sinus pressure.   Eyes: Negative for visual disturbance.  Respiratory: Negative for chest tightness.   Gastrointestinal: Negative for nausea and abdominal pain.  Genitourinary: Negative for frequency, difficulty urinating and vaginal pain.  Musculoskeletal: Negative for back pain and gait problem.  Skin: Negative for pallor.  Neurological: Negative for dizziness, tremors, weakness and numbness.  Psychiatric/Behavioral: Negative for confusion and  sleep  disturbance. The patient is nervous/anxious.        Objective:   Physical Exam  Constitutional: She appears well-developed and well-nourished. No distress.  HENT:  Head: Normocephalic.  Right Ear: External ear normal.  Left Ear: External ear normal.  Nose: Nose normal.  Mouth/Throat: Oropharynx is clear and moist.  Eyes: Conjunctivae are normal. Pupils are equal, round, and reactive to light. Right eye exhibits no discharge. Left eye exhibits no discharge.  Neck: Normal range of motion. Neck supple. No JVD present. No tracheal deviation present. No thyromegaly present.  Cardiovascular: Normal rate, regular rhythm and normal heart sounds.   Pulmonary/Chest: No stridor. No respiratory distress. She has no wheezes.  Abdominal: Soft. Bowel sounds are normal. She exhibits no distension and no mass. There is no tenderness. There is no rebound and no guarding.  Musculoskeletal: She exhibits no edema and no tenderness.  Lymphadenopathy:    She has no cervical adenopathy.  Neurological: She displays normal reflexes. No cranial nerve deficit. She exhibits normal muscle tone. Coordination normal.  Skin: No rash noted. No erythema.  Psychiatric: She has a normal mood and affect. Her behavior is normal. Judgment and thought content normal.  R lower leg is indurated and tender, pink skin - lower 1/3  Lab Results  Component Value Date   WBC 4.6 03/21/2013   HGB 8.6* 03/21/2013   HCT 25.7* 03/21/2013   PLT 177 03/21/2013   GLUCOSE 107* 03/21/2013   CHOL 110 01/08/2013   TRIG 102 01/08/2013   HDL 13* 01/08/2013   LDLDIRECT 172.2 12/21/2012   LDLCALC 77 01/08/2013   ALT 9 03/21/2013   AST 16 03/21/2013   NA 140 03/21/2013   K 4.1 03/21/2013   CL 109 03/21/2013   CREATININE 1.39* 03/21/2013   BUN 11 03/21/2013   CO2 22 03/21/2013   TSH 0.356 01/09/2013   INR 1.08 03/19/2013   HGBA1C 5.7* 01/08/2013         Assessment & Plan:   A complex case

## 2013-04-01 NOTE — Assessment & Plan Note (Signed)
Continue with current prescription therapy as reflected on the Med list.  

## 2013-04-02 LAB — IBC PANEL: Saturation Ratios: 20.8 % (ref 20.0–50.0)

## 2013-04-03 ENCOUNTER — Encounter (INDEPENDENT_AMBULATORY_CARE_PROVIDER_SITE_OTHER): Payer: Medicare Other

## 2013-04-03 ENCOUNTER — Telehealth: Payer: Self-pay | Admitting: Internal Medicine

## 2013-04-03 DIAGNOSIS — M79604 Pain in right leg: Secondary | ICD-10-CM

## 2013-04-03 DIAGNOSIS — M79609 Pain in unspecified limb: Secondary | ICD-10-CM

## 2013-04-03 DIAGNOSIS — L539 Erythematous condition, unspecified: Secondary | ICD-10-CM

## 2013-04-03 MED ORDER — DOXYCYCLINE HYCLATE 100 MG PO TABS: 100.0000 mg | ORAL_TABLET | Freq: Two times a day (BID) | ORAL | Status: DC

## 2013-04-03 NOTE — Telephone Encounter (Signed)
Clinically it did not look like cellulitis. We can ry empiric doxycycline however. Pls call dtr Thx

## 2013-04-03 NOTE — Telephone Encounter (Signed)
[name] [name] ( Vascular Tech from [facility]) called  stated that pt is negative for DVT but she thinks that pt has cellulitis. [name] [name] was wondering what Dr. Camila Li would like to do at this point since the pt is negative for DVT. Please call pt of the next step that Dr. Camila Li would like to do.

## 2013-04-04 ENCOUNTER — Telehealth: Payer: Self-pay | Admitting: Internal Medicine

## 2013-04-04 NOTE — Telephone Encounter (Signed)
Pt informed

## 2013-04-04 NOTE — Telephone Encounter (Signed)
I don't see where the patient has documented that we can speak to her daughter about her results. I informed the pt everything she needs to know as advised by PCP.

## 2013-04-04 NOTE — Telephone Encounter (Signed)
Pt's daughter wants a call with test results.  She states [name] [name] and her husband can't keep the information straight.  I told her something would need to be in the chart to give permission.

## 2013-04-05 ENCOUNTER — Encounter: Payer: Self-pay | Admitting: Gastroenterology

## 2013-04-05 ENCOUNTER — Ambulatory Visit (INDEPENDENT_AMBULATORY_CARE_PROVIDER_SITE_OTHER): Payer: Medicare Other | Admitting: Gastroenterology

## 2013-04-05 VITALS — BP 130/70 | HR 72 | Ht 64.0 in | Wt 139.0 lb

## 2013-04-05 DIAGNOSIS — K863 Pseudocyst of pancreas: Secondary | ICD-10-CM

## 2013-04-05 DIAGNOSIS — K859 Acute pancreatitis without necrosis or infection, unspecified: Secondary | ICD-10-CM

## 2013-04-05 DIAGNOSIS — K862 Cyst of pancreas: Secondary | ICD-10-CM

## 2013-04-05 NOTE — Assessment & Plan Note (Signed)
Changes by CT and MRI probably reflect fat necrosis and inflammatory changes from pancreatitis. I suspect that she may have passed a stone in July, 2014. She may have an emergent pancreatic pseudocyst.  Recommendations #1 followup CT of the abdomen in approximately 4 weeks to assess for pseudocyst formation #2 should CT demonstrated resolving inflammatory changes then I would refer her back to surgery for cholecystectomy #3 should she develop recurrent pancreatitis I would consider a sphincterotomy which mostly would confer some protection against recurrent pancreatitis until the time of her cholecystectomy.

## 2013-04-05 NOTE — Patient Instructions (Addendum)
You have been scheduled for a CT scan of the abdomen and pelvis at [facility] ([address]---this is in the same building as [facility]).   You are scheduled on 05/07/2013 at 9:30am. You should arrive 15 minutes prior to your appointment time for registration. Please follow the written instructions below on the day of your exam:

## 2013-04-05 NOTE — Progress Notes (Signed)
History of Present Illness: This is a post admission visit for this 79 year old female who had gallstone pancreatitis complicated by a CVA in May, 2014. She passed a stone spontaneously. Last month she was hospitalized for new onset of recurrent abdominal pain that  subsided while she was in the emergency room.  There were cystic changes in the pancreas suggestive of an emerging pseudocyst. Inflammatory changes were ultimately determined by MRI where it was felt that she had areas of fat necrosis in the mesentery. Cholecystectomy was postponed because of her CVA in May, 2014. Since discharge she has felt well and has no GI complaints.    Past Medical History  Diagnosis Date  . Hypertension   . Hyperlipidemia   . Palpitations   . Thyroid disease   . Weakness generalized   . Anemia   . Other urinary problems   . Lightheadedness   . Dyspnea     w/ atypical upper airway symptoms? all vcd/lpr/gerd  . Breast pain   . Vasovagal syncope   . Cellulitis and abscess of unspecified digit   . Urinary tract infection, site not specified   . Urgency of urination   . Other symptoms involving abdomen and pelvis(789.9)   . Rash and other nonspecific skin eruption   . Unspecified venous (peripheral) insufficiency   . Edema   . Osteoarthrosis, unspecified whether generalized or localized, unspecified site   . Monoclonal paraproteinemia   . Irritable bowel syndrome   . Other B-complex deficiencies   . Unspecified hypothyroidism   . GERD (gastroesophageal reflux disease)   . Anxiety   . Torus palatinus   . Stroke   . Pancreatitis   . Renal insufficiency    Past Surgical History  Procedure Laterality Date  . Back surgery    . Shoulder surgery    . Cholecystectomy    . Abdominal hysterectomy    . Vaginosacropexy    . Suprapubic bladder suspension    . Tee without cardioversion N/A 01/16/2013    Procedure: TRANSESOPHAGEAL ECHOCARDIOGRAM (TEE);  Surgeon: Josue Hector, MD;  Location: [facility]  ENDOSCOPY;  Service: Cardiovascular;  Laterality: N/A;   family history includes Heart Problems in her father and Hypertension in her father and other. Current Outpatient Prescriptions  Medication Sig Dispense Refill  . aspirin 325 MG tablet Take 1 tablet (325 mg total) by mouth daily.      . cholecalciferol (VITAMIN D) 1000 UNITS tablet Take 1,000 Units by mouth daily.        . cyanocobalamin (,VITAMIN B-12,) 1000 MCG/ML injection Inject 1 mL (1,000 mcg total) into the skin every 14 (fourteen) days.  30 mL  1  . doxycycline (VIBRA-TABS) 100 MG tablet Take 1 tablet (100 mg total) by mouth 2 (two) times daily.  14 tablet  0  . folic acid (FOLVITE) A999333 MCG tablet Take 400 mcg by mouth daily.      Marland Kitchen labetalol (NORMODYNE) 200 MG tablet Take 1 tablet (200 mg total) by mouth 2 (two) times daily.  180 tablet  3  . levothyroxine (SYNTHROID, LEVOTHROID) 88 MCG tablet Take 88 mcg by mouth daily before breakfast.      . omeprazole (PRILOSEC) 40 MG capsule Take 1 capsule (40 mg total) by mouth daily.  90 capsule  3  . PRILOSEC OTC 20 MG tablet       . traMADol (ULTRAM) 50 MG tablet Take 1 tablet (50 mg total) by mouth 2 (two) times daily as needed for pain.  [place]  tablet  0  . zaleplon (SONATA) 10 MG capsule Take 1 capsule (10 mg total) by mouth at bedtime.  90 capsule  1   No current facility-administered medications for this visit.   Allergies as of 04/05/2013 - Review Complete 04/05/2013  Allergen Reaction Noted  . Amlodipine besylate  12/21/2007  . Cefuroxime axetil  10/27/2008  . Nitrofurantoin  10/27/2008  . Sulfadiazine Rash     reports that she has never smoked. She has never used smokeless tobacco. She reports that  drinks alcohol. She reports that she does not use illicit drugs.     Review of Systems: Pertinent positive and negative review of systems were noted in the above HPI section. All other review of systems were otherwise negative.  Vital signs were reviewed in today's medical  record Physical Exam: General: Well developed , well nourished, no acute distress Skin: anicteric Head: Normocephalic and atraumatic Eyes:  sclerae anicteric, EOMI Ears: Normal auditory acuity Mouth: No deformity or lesions Neck: Supple, no masses or thyromegaly Lungs: Clear throughout to auscultation Heart: Regular rate and rhythm; no murmurs, rubs or bruits Abdomen: Soft, non tender and non distended. No masses, hepatosplenomegaly or hernias noted. Normal Bowel sounds Rectal:deferred Musculoskeletal: Symmetrical with no gross deformities  Skin: No lesions on visible extremities Pulses:  Normal pulses noted Extremities: No clubbing, cyanosis, edema or deformities noted Neurological: Alert oriented x 4, grossly nonfocal Cervical Nodes:  No significant cervical adenopathy Inguinal Nodes: No significant inguinal adenopathy Psychological:  Alert and cooperative. Normal mood and affect

## 2013-04-09 ENCOUNTER — Ambulatory Visit (INDEPENDENT_AMBULATORY_CARE_PROVIDER_SITE_OTHER): Payer: BC Managed Care – PPO | Admitting: General Surgery

## 2013-04-12 ENCOUNTER — Telehealth: Payer: Self-pay | Admitting: Internal Medicine

## 2013-04-12 NOTE — Telephone Encounter (Signed)
No. Keep ROV. Can try Aleve 1 a day pc Thx

## 2013-04-12 NOTE — Telephone Encounter (Signed)
Pt still has redness and a knot with some pain on the right leg.  She finished the antibiotic yesterday.  Should she have another round of antibiotics?

## 2013-04-15 NOTE — Telephone Encounter (Signed)
Daughter is aware.  Will call back if not better for an appt.

## 2013-04-15 NOTE — Telephone Encounter (Signed)
LMOM to return call.

## 2013-04-16 ENCOUNTER — Encounter: Payer: Self-pay | Admitting: Neurology

## 2013-04-16 ENCOUNTER — Ambulatory Visit (INDEPENDENT_AMBULATORY_CARE_PROVIDER_SITE_OTHER): Payer: Self-pay | Admitting: Neurology

## 2013-04-16 ENCOUNTER — Encounter: Payer: Self-pay | Admitting: Internal Medicine

## 2013-04-16 ENCOUNTER — Ambulatory Visit (INDEPENDENT_AMBULATORY_CARE_PROVIDER_SITE_OTHER): Payer: Medicare Other | Admitting: Internal Medicine

## 2013-04-16 VITALS — BP 150/72 | HR 76 | Temp 97.9°F | Resp 16 | Wt 138.0 lb

## 2013-04-16 VITALS — BP 150/63 | HR 66 | Ht 64.0 in | Wt 139.0 lb

## 2013-04-16 DIAGNOSIS — I6350 Cerebral infarction due to unspecified occlusion or stenosis of unspecified cerebral artery: Secondary | ICD-10-CM

## 2013-04-16 DIAGNOSIS — I10 Essential (primary) hypertension: Secondary | ICD-10-CM

## 2013-04-16 DIAGNOSIS — I872 Venous insufficiency (chronic) (peripheral): Secondary | ICD-10-CM

## 2013-04-16 DIAGNOSIS — R21 Rash and other nonspecific skin eruption: Secondary | ICD-10-CM

## 2013-04-16 MED ORDER — DOXYCYCLINE HYCLATE 100 MG PO TABS: 100.0000 mg | ORAL_TABLET | Freq: Two times a day (BID) | ORAL | Status: DC

## 2013-04-16 MED ORDER — TRIAMCINOLONE ACETONIDE 0.5 % EX OINT: TOPICAL_OINTMENT | Freq: Three times a day (TID) | CUTANEOUS | Status: DC

## 2013-04-16 NOTE — Patient Instructions (Addendum)
Continue aspirin for stroke prevention with strict control of hypertension with blood pressure goal below 130/90 and lipids with LDL cholesterol goal below 200 mg percent. The patient may undergo gallbladder surgery is indicated and will need to hold the aspirin for a few days prior to the surgery and resume it after the surgery. She and family understand that there is a small risk of stroke in the perioperative period but the risk benefit in her case in favor of the surgery. Return for followup in 3 months with Jeani Hawking, NP

## 2013-04-16 NOTE — Progress Notes (Signed)
[facility] [address] [place]. [place] [address] [phone]       OFFICE FOLLOW-UP NOTE  Ms. WINNONA CALVINO Date of Birth:  06-18-1933 Medical Record Number:  [id number]   HPI:  Ms Mccommon is a 79 year Caucasian lady is seen for followup admission in May 2014 for pancreatitis and stroke. She presented with with pancreatitis to [facility] and imaging demonstrated dilated common bile duct and gallbladder stone. Hospital course was complicated by oliguria, encephalopathy and infection. She developed respiratory failure and was transferred to [place]. She was noted having slurred speech and some right facial weakness. According to was called but she was not a TPA candidate as last known whether time was not clearly known. CT of the head was unremarkable but subsequently MRI scan demonstrated multiple small bilateral cortical and cerebellar acute as well as subacute infarcts. These were felt to be of embolic etiology but workup for source of embolism was unrewarding. TEE showed could not be completed due to patient having excessive secretions in the airway and being unable to swallow. Telemetry monitoring did not reveal cardiac arrhythmias. Transcranial Doppler studies and carotid Dopplers were unremarkable. 2-D echo showed ejection fraction of 80% with hypertrophic cardiomyopathy. Outpatient prolonged cardiac telemetry monitoring was done for 3 weeks I do not have the actual results but presumably this was normal. Vascular risk factors identified included hypertension and hyperlipidemia. She was started on aspirin and has done well. She has finished rehabilitation at [facility] and is currently doing outpatient therapy. She still not walking very well due to poor balance. She does still get intermittent speech difficulties and right hand weakness and clumsiness particularly when she is tired. She rescheduled to undergo another CT scan of the abdomen and by  gastroenterologist Dr. Deatra Ina and may need gallbladder surgery soon.  ROS:   14 system review of systems is positive for blurred vision, weight loss, fatigue, swelling in the legs, any neck, memory loss, weakness, slurred speech anxiety, decreased energy appetite, insomnia, restless legs  PMH:  Past Medical History  Diagnosis Date  . Hypertension   . Hyperlipidemia   . Palpitations   . Thyroid disease   . Weakness generalized   . Anemia   . Other urinary problems   . Lightheadedness   . Dyspnea     w/ atypical upper airway symptoms? all vcd/lpr/gerd  . Breast pain   . Vasovagal syncope   . Cellulitis and abscess of unspecified digit   . Urinary tract infection, site not specified   . Urgency of urination   . Other symptoms involving abdomen and pelvis(789.9)   . Rash and other nonspecific skin eruption   . Unspecified venous (peripheral) insufficiency   . Edema   . Osteoarthrosis, unspecified whether generalized or localized, unspecified site   . Monoclonal paraproteinemia   . Irritable bowel syndrome   . Other B-complex deficiencies   . Unspecified hypothyroidism   . GERD (gastroesophageal reflux disease)   . Anxiety   . Torus palatinus   . Stroke   . Pancreatitis   . Renal insufficiency     Social History:  History   Social History  . Marital Status: Married    Spouse Name: N/A    Number of Children: N/A  . Years of Education: N/A   Occupational History  . Not on file.   Social History Main Topics  . Smoking status: Never Smoker   . Smokeless tobacco: Never Used  . Alcohol Use:  Yes     Comment: Occasionally during the night to help sleep  . Drug Use: No  . Sexually Active: Not on file   Other Topics Concern  . Not on file   Social History Narrative   Retired   Married   Never Smoked   Alcohol use- no    Medications:   Current Outpatient Prescriptions on File Prior to Visit  Medication Sig Dispense Refill  . aspirin 325 MG tablet Take 1 tablet  (325 mg total) by mouth daily.      . cholecalciferol (VITAMIN D) 1000 UNITS tablet Take 1,000 Units by mouth daily.        . cyanocobalamin (,VITAMIN B-12,) 1000 MCG/ML injection Inject 1 mL (1,000 mcg total) into the skin every 14 (fourteen) days.  30 mL  1  . folic acid (FOLVITE) A999333 MCG tablet Take 400 mcg by mouth daily.      Marland Kitchen levothyroxine (SYNTHROID, LEVOTHROID) 88 MCG tablet Take 88 mcg by mouth daily before breakfast.      . omeprazole (PRILOSEC) 40 MG capsule Take 1 capsule (40 mg total) by mouth daily.  90 capsule  3  . PRILOSEC OTC 20 MG tablet       . traMADol (ULTRAM) 50 MG tablet Take 1 tablet (50 mg total) by mouth 2 (two) times daily as needed for pain.  60 tablet  0  . zaleplon (SONATA) 10 MG capsule Take 1 capsule (10 mg total) by mouth at bedtime.  90 capsule  1   No current facility-administered medications on file prior to visit.    Allergies:   Allergies  Allergen Reactions  . Amlodipine Besylate     REACTION: edema  . Cefuroxime Axetil     REACTION: nausea  . Nitrofurantoin     REACTION: nausea  . Sulfadiazine Rash    Physical Exam General: well developed, well nourished, seated, in no evident distress Head: head normocephalic and atraumatic. Orohparynx benign Neck: supple with no carotid or supraclavicular bruits Cardiovascular: regular rate and rhythm, no murmurs Musculoskeletal: no deformity Skin:  no rash/petichiae Vascular:  Normal pulses all extremities Filed Vitals:   04/16/13 1506  BP: 150/63  Pulse: 66    Neurologic Exam Mental Status: Awake and fully alert. Oriented to place and time. Recent and remote memory intact. Attention span, concentration and fund of knowledge appropriate. Mood and affect appropriate.  Cranial Nerves: Fundoscopic exam reveals sharp disc margins. Pupils equal, briskly reactive to light. Extraocular movements full without nystagmus. Visual fields full to confrontation. Hearing intact. Facial sensation intact. Face  shows mild right weakness. Tongue, palate moves normally and symmetrically.  Motor: Normal bulk and tone. Normal strength in all tested extremity muscles. Diminished fine finger movements on the right. Orbits left-to-right upper extremity. Mild right grip weakness. Sensory.: intact to tough and pinprick and vibratory.  Coordination: Rapid alternating movements normal in all extremities. Finger-to-nose and heel-to-shin performed accurately bilaterally. Gait and Station: Arises from chair without difficulty. Stance is normal. Gait demonstrates normal stride length and balance . Not able to heel, toe and tandem walk without difficulty.  Reflexes: 1+ and symmetric. Toes downgoing.   NIHSS  1 Modified Rankin  2   ASSESSMENT: 79 year old Caucasian lady with small bilateral cortical and cerebellar acute and subacute infarcts in May [id number] of embolic etiology without identified source. Vascular risk factors of hypertension only.    PLAN: Continue aspirin for stroke prevention with strict control of hypertension with blood pressure goal below 130/90 and  lipids with LDL cholesterol goal below 100 mg percent. The patient may undergo gallbladder surgery if indicated and will need to hold the aspirin for a few days prior to the surgery and resume it after the surgery. She and family understand that there is a small risk of stroke in the perioperative period but the risk benefit in her case in favor of the surgery. Return for followup in 3 months with Jeani Hawking, NP

## 2013-04-16 NOTE — Progress Notes (Signed)
Subjective:    HPI  The pt was worked in due to her leg rash not improving  [name] [name] is a 79 y.o. Caucasian female with history of hypertension, hyperlipidemia, hypothyroidism, anemia, recent history of CVA in May 2014 with dysarthria and right-sided deficits, recent history of pancreatitis due to common bile duct stone which has passed, history of cholelithiasis, acute renal failure, dysphagia due to CVA, and hypertrophic cardiomyopathy who presents with the above complaints. Patient had a prolonged hospital stay from 01/07/2013 to 01/23/2013 due to CVA, acute pancreatitis from cholelithiasis with stone passing in her common bile duct. Hospital course was complicated by acute renal failure. Patient also had acute respiratory failure requiring intubation and mechanical ventilation from 01/09/2013 to 01/10/2013. Hospital course was also complicated by pneumonia. After discharge patient went to skilled nursing facility at [facility]. She was discharged home 2 weeks ago and has been living with her daughter. She has made good progress at skilled nursing facility and at home, she had improvement in her right side from her stroke. Her speech and also has improved. Yesterday morning and during the night patient had epigastric pain which was sharp. The pain was radiating to her left upper quadrant. Patient did not have any nausea or vomiting. However due to her history of cholelithiasis and pancreatitis from choledocholithiasis, she called her primary care physician who did labs and CT of her abdomen pelvis without contrast. Lab showed she was in acute renal failure with creatinine of 1.7, and CT of abdomen and pelvis without contrast showed interval development of extensive retroperitoneal nodularity concerning for peritoneal carcinomatosis versus reactive changes from recent pancreatitis. CT also showed interval diffuse enlargement of pancreatic body and tail and likely pancreatic necrosis with developing  pseudocyst. Hospitalist service was asked to admit the patient for further care and management. Patient denies any recent fevers but has chills at home. Denies any chest pain, shortness of breath, diarrhea, headaches or new vision changes. Currently denies any abdominal pain. Patient admits that she does not drink adequate fluids at home to hydrate herself.  Hospital Course:  [name] [name] was admitted with abdominal pain. She underwent a CT scan without contrast because of the mild acute kidney injury which demonstrated diffuse enlargement of the pancreatic body and tail with pancreatic necrosis and evolving pseudocyst, and extensive retroperitoneal nodularity. She was seen by gastroenterology who recommended a followup MRI area her MRI of abdomen and pelvis demonstrated peritoneal, mesenteric, and peritoneal nodular soft tissues concerning for possible underlying malignancy. The gastroenterologist reviewed these findings with the radiologist and because of the lack of enhancement and the fact that she has considerable intra-abdominal inflammation from her recent pancreatitis, they felt that these nodules are more likely related to inflammatory changes and less likely related to neoplasm. Radiology recommended possibly a PET CT or endoscopic ultrasound to further characterize the lesions, however, PET CT would not be able to distinguish inflammation from malignancy, and gastroenterology did not think that they would be able to characterize the lesions further at this time with endoscopic ultrasound. They recommended repeating the MRI in approximately 3 months. If there is evolution concerning for possible malignancy, shaking and undergo further workup at that time. At this time she does not have any other signs of malignancy such as unexplained weight loss, changes to her bowel habits. Her CEA and CA 19-9 were both within normal limits. Her abdominal pain may have been due to her pseudocyst formation or she may have  had a symptomatic  gallstone. She was thought previously to have had episodes of symptomatic gallstones and had been scheduled to have an outpatient appointment with Gen. surgery for consultation for cholecystectomy. She was seen as an inpatient by general surgery to determine if they would be willing to undergo cholecystectomy as an inpatient, however, because of her recent stroke, they did not feel comfortable with surgery at this time. She will have followup with her neurologist and with cardiology for preoperative assessment prior to her August surgical appointment.  Cholelithiasis, liver functions were normal. She will have outpatient cholecystectomy a later date. See above.  Possible urinary tract infection, she was started on empiric ciprofloxacin and on July 15, however her urine culture only grew 50,000 colonies of Escherichia coli. The patient states that she was asymptomatic, so her antibiotics were discontinued. If she develops increasing evidence of urinary tract infection such as urgency, frequency, or dysuria, she should be start Keflex. She has a known intolerance to cephalosporins with nausea, however, the alternatives, because of the drug resistant patterns of Escherichia coli, would be IV.  Acute kidney injury, likely due to dehydration from abdominal pain and poor PO intake. Peak creatinine of 1.7. Improved with IVF. Creatinine will need to be rechecked as an outpatient in 1 week.  Hypertension, systolic blood pressures were persistently mildly elevated so I increased her labetalol from 100mg  BID to 200mg  BID. She should have a follow up appointment with her primary care doctor in the next few weeks to make sure that her blood pressure is controlled.  Hypothyroidism, stable, continued levothyroxine.  Anemia due to B12 deficiency and chronic disease. Her hemoglobin gradually trended down to her baseline from May of this year. She continued her B12 injections and should continue her violation  for her anemia with her primary care doctor.  GERD, stable. She continued PPI.  History of stroke in May of 2014 resulting in dysarthria and right upper and lower extremity weakness. She continued her aspirin. She should followup with cardiology for telemetry monitoring. Should also followup with neurology. She was supposed to followup with neurology approximately one month after discharge, however, she was unaware of this appointment so it has not occurred yet. She was given new information for both cardiology and neurology prior to discharge.  History of pancreatitis with complex and progressive pseudocyst involving the body and tail of the pancreas. She is followed by gastroenterology.  Consultants:  GI- [facility]  General surgery Procedures:  CT scan of abdomen and pelvis  MRI of the abdomen and pelvis Antibiotics:  cipro 7/15 to 7/17    F/u abd pain and cholelithiasis/pancreatitis/CVA  The patient is here to follow up on chronic depression, anxiety, headaches and chronic moderate fibromyalgia symptoms controlled with medicines F/u GERD, fatigue, gammopathy  Wt Readings from Last 3 Encounters:  04/16/13 138 lb (62.596 kg)  04/16/13 139 lb (63.05 kg)  04/05/13 139 lb (63.05 kg)   BP Readings from Last 3 Encounters:  04/16/13 150/72  04/16/13 150/63  04/05/13 130/70     Review of Systems  Constitutional: Positive for fatigue. Negative for activity change, appetite change and unexpected weight change.  HENT: Negative for congestion, mouth sores and sinus pressure.   Eyes: Negative for visual disturbance.  Respiratory: Negative for chest tightness.   Gastrointestinal: Negative for nausea and abdominal pain.  Genitourinary: Negative for frequency, difficulty urinating and vaginal pain.  Musculoskeletal: Negative for back pain and gait problem.  Skin: Negative for pallor.  Neurological: Negative for dizziness, tremors, weakness and  numbness.  Psychiatric/Behavioral: Negative for  confusion and sleep disturbance. The patient is nervous/anxious.        Objective:   Physical Exam  Constitutional: She appears well-developed and well-nourished. No distress.  HENT:  Head: Normocephalic.  Right Ear: External ear normal.  Left Ear: External ear normal.  Nose: Nose normal.  Mouth/Throat: Oropharynx is clear and moist.  Eyes: Conjunctivae are normal. Pupils are equal, round, and reactive to light. Right eye exhibits no discharge. Left eye exhibits no discharge.  Neck: Normal range of motion. Neck supple. No JVD present. No tracheal deviation present. No thyromegaly present.  Cardiovascular: Normal rate, regular rhythm and normal heart sounds.   Pulmonary/Chest: No stridor. No respiratory distress. She has no wheezes.  Abdominal: Soft. Bowel sounds are normal. She exhibits no distension and no mass. There is no tenderness. There is no rebound and no guarding.  Musculoskeletal: She exhibits no edema and no tenderness.  Lymphadenopathy:    She has no cervical adenopathy.  Neurological: She displays normal reflexes. No cranial nerve deficit. She exhibits normal muscle tone. Coordination normal.  Skin: No rash noted. No erythema.  Psychiatric: She has a normal mood and affect. Her behavior is normal. Judgment and thought content normal.  R lower leg is indurated and tender, pink skin - lower 1/3  Lab Results  Component Value Date   WBC 5.6 04/01/2013   HGB 9.4* 04/01/2013   HCT 27.8* 04/01/2013   PLT 207.0 04/01/2013   GLUCOSE 124* 04/01/2013   CHOL 110 01/08/2013   TRIG 102 01/08/2013   HDL 13* 01/08/2013   LDLDIRECT 172.2 12/21/2012   LDLCALC 77 01/08/2013   ALT 13 04/01/2013   AST 17 04/01/2013   NA 138 04/01/2013   K 4.7 04/01/2013   CL 105 04/01/2013   CREATININE 1.7* 04/01/2013   BUN 22 04/01/2013   CO2 27 04/01/2013   TSH 0.56 04/01/2013   INR 1.08 03/19/2013   HGBA1C 5.7* 01/08/2013         Assessment & Plan:

## 2013-04-21 ENCOUNTER — Encounter: Payer: Self-pay | Admitting: Internal Medicine

## 2013-04-21 DIAGNOSIS — R21 Rash and other nonspecific skin eruption: Secondary | ICD-10-CM | POA: Insufficient documentation

## 2013-04-21 NOTE — Assessment & Plan Note (Signed)
Continue with current prescription therapy as reflected on the Med list.  

## 2013-04-21 NOTE — Assessment & Plan Note (Signed)
2014 RLE - poss erythema nodosum Advil 1 bid Triamc cream Repeat Doxy

## 2013-04-21 NOTE — Assessment & Plan Note (Signed)
Elevate legs 

## 2013-04-22 ENCOUNTER — Ambulatory Visit: Payer: Medicare Other | Admitting: Internal Medicine

## 2013-04-22 ENCOUNTER — Ambulatory Visit (INDEPENDENT_AMBULATORY_CARE_PROVIDER_SITE_OTHER): Payer: BC Managed Care – PPO | Admitting: General Surgery

## 2013-05-01 ENCOUNTER — Telehealth: Payer: Self-pay | Admitting: *Deleted

## 2013-05-01 NOTE — Telephone Encounter (Signed)
Pt called states PA is needed for [place], [phone].  Please advise

## 2013-05-02 NOTE — Telephone Encounter (Signed)
Call Documentation    Cassandria Anger, MD at 05/02/2013 12:02 PM    Status: Signed               Ok Thx         Sharlett Iles Ackley, [place] at 05/01/2013  9:14 AM    Status: Signed                 Pt called states PA is needed for [place], [phone].  Please advise

## 2013-05-02 NOTE — Telephone Encounter (Signed)
Ok Thx 

## 2013-05-03 ENCOUNTER — Telehealth: Payer: Self-pay | Admitting: *Deleted

## 2013-05-03 ENCOUNTER — Ambulatory Visit: Payer: Medicare Other | Admitting: Internal Medicine

## 2013-05-03 NOTE — Telephone Encounter (Signed)
Called Dr Deatra Ina to advise this patient is scheduled for a CT Abd & Pelvis and her Creatinine is elevated and her GFR is right at the cusp.  He looked at her labs while we were talking and he told me to call [facility] to let them know he was ok with her having the CT scan without contrast.  I called Rose at [facility] to advise. They were at lunch and will call them back at 1PM.

## 2013-05-07 ENCOUNTER — Ambulatory Visit (INDEPENDENT_AMBULATORY_CARE_PROVIDER_SITE_OTHER)
Admission: RE | Admit: 2013-05-07 | Discharge: 2013-05-07 | Disposition: A | Discharge status: Home / Self Care | Payer: Medicare Other | Source: Ambulatory Visit | Attending: Gastroenterology | Admitting: Gastroenterology

## 2013-05-07 DIAGNOSIS — K863 Pseudocyst of pancreas: Secondary | ICD-10-CM

## 2013-05-07 DIAGNOSIS — K862 Cyst of pancreas: Secondary | ICD-10-CM

## 2013-05-07 DIAGNOSIS — K859 Acute pancreatitis without necrosis or infection, unspecified: Secondary | ICD-10-CM

## 2013-05-22 ENCOUNTER — Telehealth: Payer: Self-pay | Admitting: Gastroenterology

## 2013-05-23 NOTE — Telephone Encounter (Signed)
Pts daughter called and states they would like to know the results of the pts CT scan. Please advise.

## 2013-05-24 NOTE — Telephone Encounter (Signed)
Spoke with pts daughter and she is aware. Pt scheduled to see Dr. Deatra Ina 06/17/13@8 :30am. Daughter is aware of appt.

## 2013-05-24 NOTE — Telephone Encounter (Signed)
CT scan demonstrates a pancreatic pseudocyst.  I would like to see the patient in the office in the next one to 2 weeks.

## 2013-05-27 ENCOUNTER — Encounter: Payer: Self-pay | Admitting: Internal Medicine

## 2013-05-27 ENCOUNTER — Ambulatory Visit (INDEPENDENT_AMBULATORY_CARE_PROVIDER_SITE_OTHER): Payer: Medicare Other | Admitting: Internal Medicine

## 2013-05-27 VITALS — BP 150/52 | HR 72 | Ht 64.0 in | Wt 135.8 lb

## 2013-05-27 DIAGNOSIS — Z01818 Encounter for other preprocedural examination: Secondary | ICD-10-CM

## 2013-05-27 MED ORDER — AMLODIPINE BESYLATE 2.5 MG PO TABS: ORAL_TABLET | ORAL | Status: DC

## 2013-05-27 NOTE — Patient Instructions (Addendum)
Schedule Lexiscan Myoview    Follow instructions given.     Start Amlodipine 2.5 mg 1/2 tablet daily    Your physician wants you to follow-up in: 6 months You will receive a reminder letter in the mail two months in advance. If you don't receive a letter, please call our office to schedule the follow-up appointment.

## 2013-05-27 NOTE — Progress Notes (Signed)
HPI [name] [name] is a 33 year Caucasian lady is seen for followup admission in May 2014 for pancreatitis and stroke. She presented with with pancreatitis to [facility] and imaging demonstrated dilated common bile duct and gallbladder stone. Hospital course was complicated by oliguria, encephalopathy and infection. She developed respiratory failure and was transferred to [facility]. She was noted having slurred speech and some right facial weakness. According to was called but she was not a TPA candidate as last known whether time was not clearly known. CT of the head was unremarkable but subsequently MRI scan demonstrated multiple small bilateral cortical and cerebellar acute as well as subacute infarcts. These were felt to be of embolic etiology but workup for source of embolism was unrewarding. TEE showed could not be completed due to patient having excessive secretions in the airway and being unable to swallow. Telemetry monitoring did not reveal cardiac arrhythmias. Transcranial Doppler studies and carotid Dopplers were unremarkable. 2-D echo showed ejection fraction of 80% with hypertrophic cardiomyopathy. Outpatient prolonged cardiac telemetry monitoring was done for 3 weeks Results not immed available The patient presents today for preop risk stratification.  The patient says that yesterday she noticed intermitt substernal chest pressure.  Not associated with activity.  Lasts about 45 sec  No SOB  Goes away  Comes back in 6 or 7 min.  Going on yesterday and today. Patient does do some walking  Also does some housework.  No change in actviity level.     Allergies  Allergen Reactions  . Amlodipine Besylate     REACTION: edema  . Cefuroxime Axetil     REACTION: nausea  . Nitrofurantoin     REACTION: nausea  . Sulfadiazine Rash    Current Outpatient Prescriptions  Medication Sig Dispense Refill  . aspirin 325 MG tablet Take 1 tablet (325 mg total) by mouth daily.      .  cholecalciferol (VITAMIN D) 1000 UNITS tablet Take 1,000 Units by mouth daily.        . cyanocobalamin (,VITAMIN B-12,) 1000 MCG/ML injection Inject 1 mL (1,000 mcg total) into the skin every 14 (fourteen) days.  30 mL  1  . folic acid (FOLVITE) A999333 MCG tablet Take 400 mcg by mouth daily.      Marland Kitchen labetalol (NORMODYNE) 200 MG tablet Take 100 mg by mouth 2 (two) times daily.      Marland Kitchen levothyroxine (SYNTHROID, LEVOTHROID) 88 MCG tablet Take 88 mcg by mouth daily before breakfast.      . omeprazole (PRILOSEC) 40 MG capsule Take 1 capsule (40 mg total) by mouth daily.  90 capsule  3  . PRILOSEC OTC 20 MG tablet       . triamcinolone ointment (KENALOG) 0.5 % Apply topically 3 (three) times daily.  90 g  1   No current facility-administered medications for this visit.    Past Medical History  Diagnosis Date  . Hypertension   . Hyperlipidemia   . Palpitations   . Thyroid disease   . Weakness generalized   . Anemia   . Other urinary problems   . Lightheadedness   . Dyspnea     w/ atypical upper airway symptoms? all vcd/lpr/gerd  . Breast pain   . Vasovagal syncope   . Cellulitis and abscess of unspecified digit   . Urinary tract infection, site not specified   . Urgency of urination   . Other symptoms involving abdomen and pelvis(789.9)   . Rash and other nonspecific skin eruption   .  Unspecified venous (peripheral) insufficiency   . Edema   . Osteoarthrosis, unspecified whether generalized or localized, unspecified site   . Monoclonal paraproteinemia   . Irritable bowel syndrome   . Other B-complex deficiencies   . Unspecified hypothyroidism   . GERD (gastroesophageal reflux disease)   . Anxiety   . Torus palatinus   . Stroke   . Pancreatitis   . Renal insufficiency     Past Surgical History  Procedure Laterality Date  . Back surgery    . Shoulder surgery    . Cholecystectomy    . Abdominal hysterectomy    . Vaginosacropexy    . Suprapubic bladder suspension    . Tee  without cardioversion N/A 01/16/2013    Procedure: TRANSESOPHAGEAL ECHOCARDIOGRAM (TEE);  Surgeon: Josue Hector, MD;  Location: [facility] ENDOSCOPY;  Service: Cardiovascular;  Laterality: N/A;    Family History  Problem Relation Age of Onset  . Hypertension Other   . Hypertension Father   . Heart Problems Father     History   Social History  . Marital Status: Married    Spouse Name: N/A    Number of Children: N/A  . Years of Education: N/A   Occupational History  . Not on file.   Social History Main Topics  . Smoking status: Never Smoker   . Smokeless tobacco: Never Used  . Alcohol Use: Yes     Comment: Occasionally during the night to help sleep  . Drug Use: No  . Sexual Activity: Not on file   Other Topics Concern  . Not on file   Social History Narrative   Retired   Married   Never Smoked   Alcohol use- no    Review of Systems:  All systems reviewed.  They are negative to the above problem except as previously stated.  Vital Signs: BP 150/52  Pulse 72  Ht 5\' 4"  (1.626 m)  Wt 135 lb 12.8 oz (61.598 kg)  BMI 23.3 kg/m2  Physical Exam Patient is in NAD HEENT:  Normocephalic, atraumatic. EOMI, PERRLA.  Neck: JVP is normal.  No bruits.  Lungs: clear to auscultation. No rales no wheezes.  Heart: Regular rate and rhythm. Normal S1, S2. No S3.   No significant murmurs. PMI not displaced.  Abdomen:  Supple, nontender. Normal bowel sounds. No masses. No hepatomegaly.  Extremities:   Good distal pulses throughout. No lower extremity edema.  Musculoskeletal :moving all extremities.  Neuro:   alert and oriented x3.  CN II-XII grossly intact.  EKG  SR 66 bpm.   Assessment and Plan:  1.  CP  I am not convinced it is cardiac in origin  But since she is does have evidence of dense calcifications of coronary arteries and since she is not that active I would recomm a lexiscan myoivew to evaluate for ischemia.  Would continue prilosec  Can increae  Would also try amlodipine  1.25 mg to see if spasm (GI)  SE was edema  WIll try low dose.  2.  CAD  WIth evid of CAD she should be on statin  Will need to review lipids.  3.  HTN  Follow.   4.  CVA  Appears embolic but not evid of afib to date, even on event monitor.  Follow.

## 2013-05-28 ENCOUNTER — Ambulatory Visit (HOSPITAL_COMMUNITY): Payer: Medicare Other | Attending: Cardiology | Admitting: Radiology

## 2013-05-28 VITALS — BP 146/76 | Ht 64.0 in | Wt 135.0 lb

## 2013-05-28 DIAGNOSIS — Z8249 Family history of ischemic heart disease and other diseases of the circulatory system: Secondary | ICD-10-CM | POA: Insufficient documentation

## 2013-05-28 DIAGNOSIS — R079 Chest pain, unspecified: Secondary | ICD-10-CM | POA: Insufficient documentation

## 2013-05-28 DIAGNOSIS — R0789 Other chest pain: Secondary | ICD-10-CM

## 2013-05-28 DIAGNOSIS — Z01818 Encounter for other preprocedural examination: Secondary | ICD-10-CM

## 2013-05-28 DIAGNOSIS — Z8673 Personal history of transient ischemic attack (TIA), and cerebral infarction without residual deficits: Secondary | ICD-10-CM | POA: Insufficient documentation

## 2013-05-28 DIAGNOSIS — I10 Essential (primary) hypertension: Secondary | ICD-10-CM | POA: Insufficient documentation

## 2013-05-28 MED ORDER — TECHNETIUM TC 99M SESTAMIBI GENERIC - CARDIOLITE
33.0000 | Freq: Once | INTRAVENOUS | Status: AC | PRN
  Administered 2013-05-28: 33 via INTRAVENOUS

## 2013-05-28 MED ORDER — TECHNETIUM TC 99M SESTAMIBI GENERIC - CARDIOLITE
11.0000 | Freq: Once | INTRAVENOUS | Status: AC | PRN
  Administered 2013-05-28: 11 via INTRAVENOUS

## 2013-05-28 MED ORDER — REGADENOSON 0.4 MG/5ML IV SOLN
0.4000 mg | Freq: Once | INTRAVENOUS | Status: AC
  Administered 2013-05-28: 0.4 mg via INTRAVENOUS

## 2013-05-28 NOTE — Progress Notes (Signed)
[facility] [address] [place], [name] [address] [phone]    Cardiology Nuclear Med Study  [name] [name] is a 79 y.o. female     MRN : [id number]     DOB: Jun 11, 1933  Procedure Date: 05/28/2013  Nuclear Med Background Indication for Stress Test:  Evaluation for Ischemia and Surgical Clearance Pre-op Gallbladder Surgery- Dr. Deatra Ina, History:  5-10 yrs ago GXT: Nl per pt 2011 Stress ECHO Nl per pt 2014 ECHO: EF: 80%  Cardiac Risk Factors: CVA, Family History - CAD, Hypertension and Lipids  Symptoms:  Chest Pressure   Nuclear Pre-Procedure Caffeine/Decaff Intake:  None NPO After: 7:00am   Lungs:  clear O2 Sat: 95% on room air. IV 0.9% NS with Angio Cath:  22g  IV Site: L Antecubital  IV Started by:  Crissie Figures, RN  Chest Size (in):  34 Cup Size: C  Height: 5\' 4"  (1.626 m)  Weight:  135 lb (61.236 kg)  BMI:  Body mass index is 23.16 kg/(m^2). Tech Comments:  N/A    Nuclear Med Study 1 or 2 day study: 1 day  Stress Test Type:  Lexiscan  Reading MD: Ena Dawley, MD  Order Authorizing Provider:  Dorris Carnes, MD  Resting Radionuclide: Technetium 71m Sestamibi  Resting Radionuclide Dose: 11.0 mCi   Stress Radionuclide:  Technetium 78m Sestamibi  Stress Radionuclide Dose: 33.0 mCi           Stress Protocol Rest HR: 58 Stress HR: 89  Rest BP: 146/76 Stress BP: 154/61  Exercise Time (min): n/a METS: n/a   Predicted Max HR: 142 bpm % Max HR: 62.68 bpm Rate Pressure Product: 13706   Dose of Adenosine (mg):  n/a Dose of Lexiscan: 0.4 mg  Dose of Atropine (mg): n/a Dose of Dobutamine: n/a mcg/kg/min (at max HR)  Stress Test Technologist: Perrin Maltese, EMT-P  Nuclear Technologist:  Charlton Amor, CNMT     Rest Procedure:  Myocardial perfusion imaging was performed at rest 45 minutes following the intravenous administration of Technetium 46m Sestamibi. Rest ECG: NSR - Normal EKG  Stress Procedure:  The patient received IV Lexiscan  0.4 mg over 15-seconds.  Technetium 90m Sestamibi injected at 30-seconds. This patient was sob and felt strange with the Lexiscan injection. Quantitative spect images were obtained after a 45 minute delay. Stress ECG: No significant change from baseline ECG  QPS Raw Data Images:  Mild breast attenuation.  Normal left ventricular size. Stress Images:  There is decreased uptake in the apical lateral and anterior walls. Rest Images:  There is decreased uptake in the apical lateral wall.  Subtraction (SDS):  There is a small irreversible defect in the apical lateral wall with periinfarct ischemia in the apical anterior wall.  Transient Ischemic Dilatation (Normal <1.22):  n/a Lung/Heart Ratio (Normal <0.45):  0.38  Quantitative Gated Spect Images QGS EDV:  54 ml QGS ESV:  11 ml  Impression Exercise Capacity:  Lexiscan with no exercise. BP Response:  Normal blood pressure response. Clinical Symptoms:  There is dyspnea. ECG Impression:  No significant ST segment change suggestive of ischemia. Comparison with Prior Nuclear Study: No images to compare  Overall Impression:  Low risk stress nuclear study. There is a small irreversible defect in the apical lateral wall with periinfarct ischemia in the apical anterior wall. The defect is small with mild severity (SDS 2). It is possible that it is a result of shifting breast artifact. .  LV Ejection Fraction: 79%.  LV Wall Motion:  NL LV Function; NL Wall Motion   Ena Dawley, Lemmie Evens 05/28/2013

## 2013-05-29 ENCOUNTER — Telehealth: Payer: Self-pay | Admitting: Internal Medicine

## 2013-05-29 ENCOUNTER — Other Ambulatory Visit: Payer: Self-pay | Admitting: Internal Medicine

## 2013-05-29 NOTE — Telephone Encounter (Signed)
Spoke with pt dtr, aware of nuclear results. Results forwarded to dr Deatra Ina.

## 2013-05-29 NOTE — Telephone Encounter (Signed)
Follow up:  Pt states she is returning debra's call

## 2013-05-30 ENCOUNTER — Other Ambulatory Visit: Payer: Self-pay | Admitting: *Deleted

## 2013-05-30 DIAGNOSIS — E7800 Pure hypercholesterolemia, unspecified: Secondary | ICD-10-CM

## 2013-06-17 ENCOUNTER — Ambulatory Visit: Payer: Medicare Other | Admitting: Gastroenterology

## 2013-06-19 ENCOUNTER — Ambulatory Visit: Payer: Medicare Other | Admitting: Gastroenterology

## 2013-07-01 ENCOUNTER — Other Ambulatory Visit (INDEPENDENT_AMBULATORY_CARE_PROVIDER_SITE_OTHER): Payer: Medicare Other

## 2013-07-01 ENCOUNTER — Ambulatory Visit (INDEPENDENT_AMBULATORY_CARE_PROVIDER_SITE_OTHER): Payer: Medicare Other | Admitting: Gastroenterology

## 2013-07-01 ENCOUNTER — Encounter: Payer: Self-pay | Admitting: Gastroenterology

## 2013-07-01 VITALS — BP 132/60 | HR 68 | Ht 62.25 in | Wt 137.2 lb

## 2013-07-01 DIAGNOSIS — D649 Anemia, unspecified: Secondary | ICD-10-CM

## 2013-07-01 DIAGNOSIS — K819 Cholecystitis, unspecified: Secondary | ICD-10-CM

## 2013-07-01 DIAGNOSIS — K862 Cyst of pancreas: Secondary | ICD-10-CM

## 2013-07-01 DIAGNOSIS — Z8719 Personal history of other diseases of the digestive system: Secondary | ICD-10-CM

## 2013-07-01 DIAGNOSIS — R933 Abnormal findings on diagnostic imaging of other parts of digestive tract: Secondary | ICD-10-CM

## 2013-07-01 LAB — COMPREHENSIVE METABOLIC PANEL
ALT: 19 U/L (ref 0–35)
Alkaline Phosphatase: 53 U/L (ref 39–117)
BUN: 29 mg/dL — ABNORMAL HIGH (ref 6–23)
Calcium: 9 mg/dL (ref 8.4–10.5)
Chloride: 105 mEq/L (ref 96–112)
Creatinine, Ser: 1.7 mg/dL — ABNORMAL HIGH (ref 0.4–1.2)
GFR: 31.92 mL/min — ABNORMAL LOW (ref >60.00)
Sodium: 140 mEq/L (ref 135–145)
Total Bilirubin: 0.5 mg/dL (ref 0.3–1.2)
Total Protein: 7.8 g/dL (ref 6.0–8.3)

## 2013-07-01 NOTE — Progress Notes (Signed)
History of Present Illness:  The patient has returned for followup of pancreatitis.  She had acute pancreatitis in May, [id number] complicated by an asymptomatic pseudocyst.  She was last scanned in early September.  She's had no further episodes of pain.  Surgery was postponed because of a CVA.  Her main complaint is fatigue.  She has a history of anemia.  In July hemoglobin was 9.4.  Iron studies were normal.  Blood work was checked recently by her PCP although the results are not known.    Review of Systems: Pertinent positive and negative review of systems were noted in the above HPI section. All other review of systems were otherwise negative.    Current Medications, Allergies, Past Medical History, Past Surgical History, Family History and Social History were reviewed in [facility] record  Vital signs were reviewed in today's medical record. Physical Exam: General: Well developed , well nourished, no acute distress Skin: anicteric Head: Normocephalic and atraumatic Eyes:  sclerae anicteric, EOMI Ears: Normal auditory acuity Mouth: No deformity or lesions Lungs: Clear throughout to auscultation Heart: Regular rate and rhythm; no murmurs, rubs or bruits Abdomen: Soft, non tender and non distended. No masses, hepatosplenomegaly or hernias noted. Normal Bowel sounds Rectal:deferred Musculoskeletal: Symmetrical with no gross deformities  Pulses:  Normal pulses noted Extremities: No clubbing, cyanosis, edema or deformities noted Neurological: Alert oriented x 4, grossly nonfocal Psychological:  Alert and cooperative. Normal mood and affect

## 2013-07-01 NOTE — Assessment & Plan Note (Addendum)
Patient has history of gallstone pancreatitis.  She has been stable for 6 months and I believe she is a suitable candidate for cholecystectomy.  Accordingly, I will refer her back to general surgery (Dr. Molli Posey)

## 2013-07-01 NOTE — Patient Instructions (Addendum)
You have been scheduled for a CT scan of the abdomen  at [facility] ([address]---this is in the same building as Press photographer).   You are scheduled on 07/03/2013 at 1pm. You should arrive 15 minutes prior to your appointment time for registration. Please follow the written instructions below on the day of your exam:  WARNING: IF YOU ARE ALLERGIC TO IODINE/X-RAY DYE, PLEASE NOTIFY RADIOLOGY IMMEDIATELY AT [phone]! YOU WILL BE GIVEN A 13 HOUR PREMEDICATION PREP.  1) Do not eat or drink anything after 9am (4 hours prior to your test) 2) You have been given 2 bottles of oral contrast to drink. The solution may taste               better if refrigerated, but do NOT add ice or any other liquid to this solution. Shake             well before drinking.    Drink 1 bottle of contrast @ 11am (2 hours prior to your exam)  Drink 1 bottle of contrast @12pm  (1 hour prior to your exam)  You may take any medications as prescribed with a small amount of water except for the following: Metformin, Glucophage, Glucovance, Avandamet, Riomet, Fortamet, Actoplus Met, Janumet, Glumetza or Metaglip. The above medications must be held the day of the exam AND 48 hours after the exam.  The purpose of you drinking the oral contrast is to aid in the visualization of your intestinal tract. The contrast solution may cause some diarrhea. Before your exam is started, you will be given a small amount of fluid to drink. Depending on your individual set of symptoms, you may also receive an intravenous injection of x-ray contrast/dye. Plan on being at [facility] for 30 minutes or long, depending on the type of exam you are having performed.  This test typically takes 30-45 minutes to complete.  If you have any questions regarding your exam or if you need to reschedule, you may call the CT department at [phone] between the hours of 8:00 am and 5:00 pm, Monday-Friday.   You will need to go to  the lab today ________________________________________________________________________ We are referring you to [facility] for consult of Gallbladder surgery and drainage of cyst  [facility] will contact you with that appointment

## 2013-07-01 NOTE — Assessment & Plan Note (Signed)
Patient has an anemia of chronic disease.  Iron studies are equivocal.  Underwent endoscopy and colonoscopy in 2011.  Electrolyte hernia and a diminutive polyp was seen, respectively.  Recommendations #1 review recent bloodwork #2 stool Hemoccult

## 2013-07-01 NOTE — Assessment & Plan Note (Addendum)
At last exam patient had a large pseudocyst.  Recommendations #1 followup CT; if pseudocyst is still present and significant in size would recommend internal drainage at the time of cholecystectomy

## 2013-07-02 ENCOUNTER — Telehealth: Payer: Self-pay | Admitting: Gastroenterology

## 2013-07-02 NOTE — Telephone Encounter (Signed)
Request faxed to office for lab results. Daughter aware.

## 2013-07-03 ENCOUNTER — Ambulatory Visit (INDEPENDENT_AMBULATORY_CARE_PROVIDER_SITE_OTHER)
Admission: RE | Admit: 2013-07-03 | Discharge: 2013-07-03 | Disposition: A | Discharge status: Home / Self Care | Payer: Medicare Other | Source: Ambulatory Visit | Attending: Gastroenterology | Admitting: Gastroenterology

## 2013-07-03 DIAGNOSIS — K862 Cyst of pancreas: Secondary | ICD-10-CM

## 2013-07-03 MED ORDER — IOHEXOL 300 MG/ML  SOLN
70.0000 mL | Freq: Once | INTRAMUSCULAR | Status: AC | PRN
  Administered 2013-07-03: 70 mL via INTRAVENOUS

## 2013-07-09 ENCOUNTER — Encounter (INDEPENDENT_AMBULATORY_CARE_PROVIDER_SITE_OTHER): Payer: Self-pay | Admitting: Surgery

## 2013-07-09 ENCOUNTER — Other Ambulatory Visit (INDEPENDENT_AMBULATORY_CARE_PROVIDER_SITE_OTHER): Payer: Medicare Other

## 2013-07-09 ENCOUNTER — Other Ambulatory Visit (INDEPENDENT_AMBULATORY_CARE_PROVIDER_SITE_OTHER): Payer: Self-pay | Admitting: Surgery

## 2013-07-09 ENCOUNTER — Ambulatory Visit (INDEPENDENT_AMBULATORY_CARE_PROVIDER_SITE_OTHER): Payer: Medicare Other | Admitting: Surgery

## 2013-07-09 ENCOUNTER — Encounter (INDEPENDENT_AMBULATORY_CARE_PROVIDER_SITE_OTHER): Payer: Self-pay | Admitting: General Surgery

## 2013-07-09 ENCOUNTER — Encounter (INDEPENDENT_AMBULATORY_CARE_PROVIDER_SITE_OTHER): Payer: Self-pay

## 2013-07-09 VITALS — BP 128/60 | HR 60 | Resp 16 | Ht 63.0 in | Wt 138.6 lb

## 2013-07-09 DIAGNOSIS — K8020 Calculus of gallbladder without cholecystitis without obstruction: Secondary | ICD-10-CM

## 2013-07-09 DIAGNOSIS — K859 Acute pancreatitis without necrosis or infection, unspecified: Secondary | ICD-10-CM

## 2013-07-09 DIAGNOSIS — K851 Biliary acute pancreatitis without necrosis or infection: Secondary | ICD-10-CM

## 2013-07-09 DIAGNOSIS — R1011 Right upper quadrant pain: Secondary | ICD-10-CM

## 2013-07-09 DIAGNOSIS — K862 Cyst of pancreas: Secondary | ICD-10-CM

## 2013-07-09 HISTORY — DX: Biliary acute pancreatitis without necrosis or infection: K85.10

## 2013-07-09 LAB — FECAL OCCULT BLOOD, IMMUNOCHEMICAL: Fecal Occult Bld: NEGATIVE

## 2013-07-09 NOTE — Progress Notes (Signed)
Patient ID: [name] [name], female   DOB: 1933/02/15, 79 y.o.   MRN: [id number]  Chief Complaint  Patient presents with  . New Evaluation    cholecystitis    HPI [name] [name] is a 79 y.o. female.  Referred by Dr. Erskine [name] for evaluation of gallstone pancreatitis  HPI This is a 79 year old female with multiple medical problems who is status post severe acute pancreatitis in May of 2014.Her hospitalization was complicated by an acute stroke. She has had resolution of most of her stroke symptoms but remains slightly weak on her right side. Currently she is not having A. Abdominal symptoms. In July she had a CT as well as MR of the abdomen.  This confirmed a 3.6 x 8.5 cm pancreatic pseudocyst along the Pancreatic body. She had a recent CT scan last week which showed That the cyst was down to 2.7 x 8.0 cm. The patient remains asymptomatic. She is tolerating a regular diet without difficulty. She does have a significant amount constipation.  These scans also showed a 12 mm gallstone within the gallbladder.  Past Medical History  Diagnosis Date  . Hypertension   . Hyperlipidemia   . Palpitations   . Thyroid disease   . Weakness generalized   . Anemia   . Other urinary problems   . Lightheadedness   . Dyspnea     w/ atypical upper airway symptoms? all vcd/lpr/gerd  . Breast pain   . Vasovagal syncope   . Cellulitis and abscess of unspecified digit   . Urinary tract infection, site not specified   . Urgency of urination   . Other symptoms involving abdomen and pelvis(789.9)   . Rash and other nonspecific skin eruption   . Unspecified venous (peripheral) insufficiency   . Edema   . Osteoarthrosis, unspecified whether generalized or localized, unspecified site   . Monoclonal paraproteinemia   . Irritable bowel syndrome   . Other B-complex deficiencies   . Unspecified hypothyroidism   . GERD (gastroesophageal reflux disease)   . Anxiety   . Torus palatinus   . Stroke   .  Pancreatitis   . Renal insufficiency     Past Surgical History  Procedure Laterality Date  . Back surgery    . Shoulder surgery    . Cholecystectomy    . Abdominal hysterectomy    . Vaginosacropexy    . Suprapubic bladder suspension    . Tee without cardioversion N/A 01/16/2013    Procedure: TRANSESOPHAGEAL ECHOCARDIOGRAM (TEE);  Surgeon: Josue Hector, MD;  Location: [facility] ENDOSCOPY;  Service: Cardiovascular;  Laterality: N/A;    Family History  Problem Relation Age of Onset  . Hypertension Other   . Hypertension Father   . Heart Problems Father     Social History History  Substance Use Topics  . Smoking status: Never Smoker   . Smokeless tobacco: Never Used  . Alcohol Use: Yes     Comment: Occasionally during the night to help sleep    Allergies  Allergen Reactions  . Amlodipine Besylate     REACTION: edema  . Cefuroxime Axetil     REACTION: nausea  . Nitrofurantoin     REACTION: nausea  . Sulfadiazine Rash    Current Outpatient Prescriptions  Medication Sig Dispense Refill  . amLODipine (NORVASC) 2.5 MG tablet Take 1/2 tablet daily  30 tablet  6  . aspirin 325 MG tablet Take 1 tablet (325 mg total) by mouth daily.      Marland Kitchen  cholecalciferol (VITAMIN D) 1000 UNITS tablet Take 1,000 Units by mouth daily.        . cyanocobalamin (,VITAMIN B-12,) 1000 MCG/ML injection Inject 1 mL (1,000 mcg total) into the skin every 14 (fourteen) days.  30 mL  1  . folic acid (FOLVITE) A999333 MCG tablet Take 400 mcg by mouth daily.      Marland Kitchen labetalol (NORMODYNE) 200 MG tablet Take 100 mg by mouth 2 (two) times daily.      Marland Kitchen levothyroxine (SYNTHROID, LEVOTHROID) 88 MCG tablet Take 88 mcg by mouth daily before breakfast.      . omeprazole (PRILOSEC) 40 MG capsule Take 1 capsule (40 mg total) by mouth daily.  90 capsule  3  . triamcinolone ointment (KENALOG) 0.5 % Apply topically 3 (three) times daily.  90 g  1  . zaleplon (SONATA) 10 MG capsule        No current facility-administered  medications for this visit.    Review of Systems Review of Systems  Constitutional: Negative for fever, chills and unexpected weight change.  HENT: Negative for congestion, hearing loss, sore throat, trouble swallowing and voice change.   Eyes: Negative for visual disturbance.  Respiratory: Negative for cough and wheezing.   Cardiovascular: Negative for chest pain, palpitations and leg swelling.  Gastrointestinal: Positive for constipation and abdominal distention. Negative for nausea, vomiting, abdominal pain, diarrhea, blood in stool and anal bleeding.  Genitourinary: Negative for hematuria, vaginal bleeding and difficulty urinating.  Musculoskeletal: Negative for arthralgias.  Skin: Negative for rash and wound.  Neurological: Negative for seizures, syncope and headaches.  Hematological: Negative for adenopathy. Does not bruise/bleed easily.  Psychiatric/Behavioral: Negative for confusion.    Blood pressure 128/60, pulse 60, resp. rate 16, height 5\' 3"  (1.6 m), weight 138 lb 9.6 oz (62.869 kg).  Physical Exam Physical Exam WDWN in NAD HEENT:  EOMI, sclera anicteric; large torus palatinus Neck:  No masses, no thyromegaly Lungs:  CTA bilaterally; normal respiratory effort CV:  Regular rate and rhythm; no murmurs Abd:  +bowel sounds, soft, non-tender, no masses Ext:  Well-perfused; no edema Skin:  Warm, dry; no sign of jaundice  Data Reviewed Ct Abdomen W Contrast  07/03/2013   CLINICAL DATA:  Re-evaluate pancreatic pseudocyst.  EXAM: CT ABDOMEN WITH CONTRAST  TECHNIQUE: Multidetector CT imaging of the abdomen was performed using the standard protocol following bolus administration of intravenous contrast.  CONTRAST:  9mL OMNIPAQUE IOHEXOL 300 MG/ML  SOLN  COMPARISON:  CT of the abdomen and pelvis 05/07/2013.  FINDINGS: Lung Bases: Patchy areas of subsegmental atelectasis and/or scarring are noted throughout the lung bases bilaterally, similar to the prior examination. Mild  calcification of the mitral annulus.  Abdomen: Again noted is a large ovoid shaped low attenuation collection and extending the length of the pancreatic body measuring 8.0 x 2.7 cm on today's examination, likely to represent a large pancreatic pseudocyst. There is a smaller collection measuring approximately 1.8 cm in the tail of the pancreas, also similar to the prior examination. It is uncertain whether or not these 2 collections intercommunicate, or are in fact separate collections. No definite peripancreatic inflammatory changes at this time to suggest active acute pancreatitis. No definite pancreatic ductal dilatation. No definite enhancing pancreatic lesion.  12 mm calcified gallstone in the neck of the gallbladder. No current findings to suggest acute cholecystitis at this time. The appearance of the liver, spleen, bilateral adrenal glands and bilateral kidneys is unremarkable. Within the visualized peritoneal cavity there is no significant volume  of ascites, no pneumoperitoneum and no pathologic distention of small bowel. No definite lymphadenopathy identified.  Musculoskeletal: Postoperative changes of PLIF at L4-L5. There are no aggressive appearing lytic or blastic lesions noted in the visualized portions of the skeleton.  IMPRESSION: 1. Large pancreatic pseudocyst in the body of the pancreas an smaller lesion in the tail of the pancreas (which may intercommunicate), similar to the prior study, as discussed above. No new pancreatic pseudocysts or findings to suggest active acute pancreatitis are identified on today's examination. 2. Cholelithiasis without evidence to suggest acute cholecystitis at this time. 3. Additional incidental findings, similar to prior studies, as above.   Electronically Signed   By: Vinnie Langton M.D.   On: 07/03/2013 17:04   04/05/13  *RADIOLOGY REPORT*  Clinical Data: Re-evaluate pancreatic pseudocyst  CT ABDOMEN AND PELVIS WITHOUT CONTRAST  Technique: Multidetector CT  imaging of the abdomen and pelvis was  performed following the standard protocol without intravenous  contrast.  Comparison: MRI abdomen dated 03/19/2013. CT abdomen pelvis dated  03/18/2013.  Findings: Lung bases are essentially clear.  Fluid within the distal esophagus, raising the possibility of  esophageal dysmotility or gastroesophageal reflux.  Unenhanced liver, spleen, and adrenal glands within normal limits.  3.1 x 8.4 cm fluid density lesion along the pancreatic body (series  2/image 26), previously 3.6 x 8.5 cm, measuring at the upper limits  of simple fluid density and compatible with a pancreatic  pseudocyst. No peripancreatic inflammatory changes by CT.  Cholelithiasis, without associated inflammatory changes. No  intrahepatic or extrahepatic ductal dilatation.  Kidneys are within normal limits. No renal calculi or  hydronephrosis.  No evidence of bowel obstruction. Colonic diverticulosis, without  associated inflammatory changes.  Atherosclerotic calcifications of the abdominal aorta and branch  vessels.  Collateral vessels in the left upper abdomen with additional  vessels along the left posterolateral abdominal  wall/retroperitoneum (series 2/image 40), unchanged.  No abdominopelvic ascites.  No suspicious abdominopelvic lymphadenopathy.  Status post hysterectomy. Bilateral ovaries are unremarkable.  No ureteral or bladder calculi. Bladder is within normal limits.  Degenerative changes of the visualized thoracolumbar spine. Status  post PLIF at L4-5.  IMPRESSION:  Suspected 8.4 cm pseudocyst in the pancreatic body, stable versus  mildly decreased.  While the intraparenchymal location is less typical, the lack of  interval progression is reassuring.  Cholelithiasis, without associated inflammatory changes.  Original Report Authenticated By: Julian Hy, M.D.   Lab Results  Component Value Date   WBC 5.6 04/01/2013   HGB 9.4* 04/01/2013   HCT 27.8* 04/01/2013    MCV 92.1 04/01/2013   PLT 207.0 04/01/2013   Lab Results  Component Value Date   ALT 19 07/01/2013   AST 18 07/01/2013   ALKPHOS 53 07/01/2013   BILITOT 0.5 07/01/2013   Lab Results  Component Value Date   CREATININE 1.7* 07/01/2013   BUN 29* 07/01/2013   NA 140 07/01/2013   K 4.3 07/01/2013   CL 105 07/01/2013   CO2 26 07/01/2013   Lab Results  Component Value Date   LIPASE 27.0 04/01/2013    Assessment    Cholelithiasis with recent acute pancreatitis     Plan    Cardiac clearance, then proceed with Laparoscopic cholecystectomy with intraoperative cholangiogram. The surgical procedure has been discussed with the patient.  Potential risks, benefits, alternative treatments, and expected outcomes have been explained.  All of the patient's questions at this time have been answered.  The likelihood of reaching the patient's treatment goal  is good.  The patient understand the proposed surgical procedure and wishes to proceed. At this time, it appears that the pseudocyst is getting smaller. Since she is asymptomatic, would recommend leaving the pancreas alone and following up with a another CT scan in about 6 months.        Loretta Doutt K. 07/09/2013, 3:08 PM

## 2013-07-10 NOTE — Progress Notes (Signed)
Quick Note:  Please inform the patient that hemeoccult was normal and to continue current plan of action ______

## 2013-07-12 ENCOUNTER — Encounter (HOSPITAL_COMMUNITY): Payer: Self-pay | Admitting: Pharmacy Technician

## 2013-07-12 ENCOUNTER — Encounter (INDEPENDENT_AMBULATORY_CARE_PROVIDER_SITE_OTHER): Payer: Self-pay

## 2013-07-15 ENCOUNTER — Ambulatory Visit: Payer: Medicare Other | Admitting: Physician Assistant

## 2013-07-16 ENCOUNTER — Encounter: Payer: Self-pay | Admitting: Physician Assistant

## 2013-07-16 ENCOUNTER — Encounter (HOSPITAL_COMMUNITY): Payer: Self-pay | Admitting: *Deleted

## 2013-07-16 ENCOUNTER — Other Ambulatory Visit (HOSPITAL_COMMUNITY): Payer: Medicare Other

## 2013-07-17 MED ORDER — CIPROFLOXACIN IN D5W 400 MG/200ML IV SOLN
400.0000 mg | INTRAVENOUS | Status: AC
  Administered 2013-07-18: 400 mg via INTRAVENOUS

## 2013-07-17 NOTE — Progress Notes (Signed)
Anesthesia Chart Review:  Patient is a 79 year old female scheduled for laparoscopic cholecystectomy on 07/18/13.  She is scheduled to be a same day work-up.  History noted in Epic.  She was admitted in May 2014 for gallstone pancreatitis and CVA felt to be embolic. She also had chest pain.  Since then she has seen neurologist Dr. Leonie Man (04/16/13) and felt okay for surgery from a neurology standpoint although with a small risk of perioperative CVA.  She also saw cardiologist Dr. Harrington Challenger and had preoperative nuclear stress test that was felt low risk for surgery.   Nuclear stress test on 05/29/13 showed: Low risk stress nuclear study. There is a small irreversible defect in the apical lateral wall with periinfarct ischemia in the apical anterior wall. The defect is small with mild severity (SDS 2). It is possible that it is a result of shifting breast artifact. .  LV Ejection Fraction: 79%. LV Wall Motion: NL LV Function; NL Wall Motion.  Echo on 01/08/13 showed: - Procedure narrative: Transthoracic echocardiography. The study was technically difficult, as a result of poor acoustic windows and poor sound wave transmission. - Left ventricle: The cavity size was normal. There is severe septal thickening consistent with hypertrophic cardiomyopathy. The septum measures 1.9 cm and the posterior wall measures 0.9 cm. The maximum LVOT gradient at rest was 22 mmHg - valsalva was not performed (Recommend repeat imaging with valsalvawhen tachycardia has slowed - definity contrast may be helpful for endocardial definition). The estimated ejection fraction was 80%. Doppler parameters are consistent with abnormal left ventricular relaxation (grade 1 diastolic dysfunction). - Mitral valve: Systolic anterior motion (SAM) of the anterior mitral leaflet is noted. There is posterior mitral annular calcification. - Left atrium: The atrium was normal in size. - Right ventricle: There appears to be a linear object in the RVOT,  possibly extending throught the PV - ?PA catheter versus artifact. - Systemic veins: The IVC was not visualized.  Carotid duplex on 01/08/13 showed: No significant extracranial carotid artery stenosis demonstrated. Vertebrals are patent with antegrade flow.  EKG on 05/27/13 showed NSR.  She will need CXR and labs tomorrow.  It appears her baseline Cr is ~ 1.4 - 1.7 since July 2014.  George Hugh [facility] Short Stay Center/Anesthesiology Phone [phone] 07/17/2013 12:52 PM

## 2013-07-18 ENCOUNTER — Encounter (HOSPITAL_COMMUNITY): Payer: Self-pay | Admitting: *Deleted

## 2013-07-18 ENCOUNTER — Observation Stay (HOSPITAL_COMMUNITY)
Admission: RE | Admit: 2013-07-18 | Discharge: 2013-07-20 | Disposition: A | Discharge status: Home / Self Care | Payer: Medicare Other | Source: Ambulatory Visit | Attending: Surgery | Admitting: Surgery

## 2013-07-18 ENCOUNTER — Ambulatory Visit (HOSPITAL_COMMUNITY): Payer: Medicare Other | Admitting: Vascular Surgery

## 2013-07-18 ENCOUNTER — Ambulatory Visit: Payer: Medicare Other | Admitting: Nurse Practitioner

## 2013-07-18 ENCOUNTER — Encounter (HOSPITAL_COMMUNITY)
Admission: RE | Disposition: A | Discharge status: Home / Self Care | Payer: Self-pay | Source: Ambulatory Visit | Attending: Surgery

## 2013-07-18 ENCOUNTER — Ambulatory Visit (HOSPITAL_COMMUNITY): Payer: Medicare Other

## 2013-07-18 ENCOUNTER — Encounter (HOSPITAL_COMMUNITY): Payer: Medicare Other | Admitting: Vascular Surgery

## 2013-07-18 DIAGNOSIS — K8010 Calculus of gallbladder with chronic cholecystitis without obstruction: Secondary | ICD-10-CM

## 2013-07-18 DIAGNOSIS — K862 Cyst of pancreas: Secondary | ICD-10-CM | POA: Insufficient documentation

## 2013-07-18 DIAGNOSIS — Z8673 Personal history of transient ischemic attack (TIA), and cerebral infarction without residual deficits: Secondary | ICD-10-CM | POA: Insufficient documentation

## 2013-07-18 DIAGNOSIS — I10 Essential (primary) hypertension: Secondary | ICD-10-CM | POA: Insufficient documentation

## 2013-07-18 DIAGNOSIS — Z7982 Long term (current) use of aspirin: Secondary | ICD-10-CM | POA: Insufficient documentation

## 2013-07-18 DIAGNOSIS — K851 Biliary acute pancreatitis without necrosis or infection: Secondary | ICD-10-CM | POA: Diagnosis present

## 2013-07-18 HISTORY — DX: Other specified postprocedural states: R11.2

## 2013-07-18 HISTORY — PX: CHOLECYSTECTOMY: SHX55

## 2013-07-18 HISTORY — DX: Other complications of anesthesia, initial encounter: T88.59XA

## 2013-07-18 HISTORY — DX: Other specified postprocedural states: Z98.890

## 2013-07-18 HISTORY — DX: Adverse effect of unspecified anesthetic, initial encounter: T41.45XA

## 2013-07-18 HISTORY — DX: Biliary acute pancreatitis without necrosis or infection: K85.10

## 2013-07-18 HISTORY — DX: Constipation, unspecified: K59.00

## 2013-07-18 HISTORY — DX: Pneumonia, unspecified organism: J18.9

## 2013-07-18 LAB — BASIC METABOLIC PANEL
CO2: 26 mEq/L (ref 19–32)
Calcium: 9.1 mg/dL (ref 8.4–10.5)
Chloride: 103 mEq/L (ref 96–112)
Creatinine, Ser: 1.59 mg/dL — ABNORMAL HIGH (ref 0.50–1.10)
GFR calc Af Amer: 35 mL/min — ABNORMAL LOW (ref >90)
Sodium: 139 mEq/L (ref 135–145)

## 2013-07-18 LAB — CBC
Hemoglobin: 9.7 g/dL — ABNORMAL LOW (ref 12.0–15.0)
MCV: 93.1 fL (ref 78.0–100.0)
Platelets: 198 10*3/uL (ref 150–400)
RBC: 3.05 MIL/uL — ABNORMAL LOW (ref 3.87–5.11)
RDW: 14.2 % (ref 11.5–15.5)
WBC: 6.4 10*3/uL (ref 4.0–10.5)

## 2013-07-18 SURGERY — LAPAROSCOPIC CHOLECYSTECTOMY
Anesthesia: General | Site: Abdomen | Wound class: Clean Contaminated

## 2013-07-18 MED ORDER — LEVOTHYROXINE SODIUM 88 MCG PO TABS
88.0000 ug | ORAL_TABLET | Freq: Every day | ORAL | Status: DC
  Administered 2013-07-19 – 2013-07-20 (×2): 88 ug via ORAL
  Filled 2013-07-18 (×4): qty 1

## 2013-07-18 MED ORDER — FENTANYL CITRATE 0.05 MG/ML IJ SOLN
INTRAMUSCULAR | Status: AC
  Filled 2013-07-18: qty 2

## 2013-07-18 MED ORDER — ROCURONIUM BROMIDE 100 MG/10ML IV SOLN
INTRAVENOUS | Status: DC | PRN
  Administered 2013-07-18: 10 mg via INTRAVENOUS
  Administered 2013-07-18: 30 mg via INTRAVENOUS

## 2013-07-18 MED ORDER — POTASSIUM CHLORIDE IN NACL 20-0.9 MEQ/L-% IV SOLN
INTRAVENOUS | Status: DC
  Administered 2013-07-18: 16:00:00 via INTRAVENOUS
  Filled 2013-07-18 (×4): qty 1000

## 2013-07-18 MED ORDER — GLYCOPYRROLATE 0.2 MG/ML IJ SOLN
INTRAMUSCULAR | Status: DC | PRN
  Administered 2013-07-18: 0.2 mg via INTRAVENOUS
  Administered 2013-07-18: 0.6 mg via INTRAVENOUS

## 2013-07-18 MED ORDER — NEOSTIGMINE METHYLSULFATE 1 MG/ML IJ SOLN
INTRAMUSCULAR | Status: DC | PRN
  Administered 2013-07-18: 1 mg via INTRAVENOUS
  Administered 2013-07-18: 4 mg via INTRAVENOUS

## 2013-07-18 MED ORDER — HYDROCODONE-ACETAMINOPHEN 5-325 MG PO TABS
1.0000 | ORAL_TABLET | ORAL | Status: DC | PRN
  Administered 2013-07-18: 2 via ORAL
  Administered 2013-07-19: 1 via ORAL
  Administered 2013-07-19 (×2): 2 via ORAL
  Administered 2013-07-20 (×2): 1 via ORAL
  Filled 2013-07-18: qty 2
  Filled 2013-07-18: qty 1
  Filled 2013-07-18: qty 2
  Filled 2013-07-18: qty 1
  Filled 2013-07-18: qty 2
  Filled 2013-07-18: qty 1

## 2013-07-18 MED ORDER — ONDANSETRON HCL 4 MG PO TABS: 4.0000 mg | ORAL_TABLET | Freq: Four times a day (QID) | ORAL | Status: DC | PRN

## 2013-07-18 MED ORDER — SODIUM CHLORIDE 0.9 % IV SOLN: INTRAVENOUS | Status: DC | PRN

## 2013-07-18 MED ORDER — PANTOPRAZOLE SODIUM 40 MG PO TBEC
40.0000 mg | DELAYED_RELEASE_TABLET | Freq: Every day | ORAL | Status: DC
  Administered 2013-07-19 – 2013-07-20 (×2): 40 mg via ORAL
  Filled 2013-07-18 (×2): qty 1

## 2013-07-18 MED ORDER — FENTANYL CITRATE 0.05 MG/ML IJ SOLN
25.0000 ug | INTRAMUSCULAR | Status: AC | PRN
  Administered 2013-07-18 (×6): 25 ug via INTRAVENOUS

## 2013-07-18 MED ORDER — WHITE PETROLATUM GEL
Status: AC
  Filled 2013-07-18: qty 5

## 2013-07-18 MED ORDER — BUPIVACAINE-EPINEPHRINE 0.25% -1:200000 IJ SOLN
INTRAMUSCULAR | Status: DC | PRN
  Administered 2013-07-18: 30 mL

## 2013-07-18 MED ORDER — ACETAMINOPHEN 325 MG PO TABS: 650.0000 mg | ORAL_TABLET | ORAL | Status: DC | PRN

## 2013-07-18 MED ORDER — ONDANSETRON HCL 4 MG/2ML IJ SOLN
INTRAMUSCULAR | Status: DC | PRN
  Administered 2013-07-18: 4 mg via INTRAVENOUS

## 2013-07-18 MED ORDER — LIDOCAINE HCL (CARDIAC) 20 MG/ML IV SOLN
INTRAVENOUS | Status: DC | PRN
  Administered 2013-07-18: 30 mg via INTRAVENOUS

## 2013-07-18 MED ORDER — CIPROFLOXACIN IN D5W 400 MG/200ML IV SOLN
INTRAVENOUS | Status: AC
  Filled 2013-07-18: qty 200

## 2013-07-18 MED ORDER — HYDROMORPHONE HCL PF 1 MG/ML IJ SOLN
INTRAMUSCULAR | Status: AC
  Filled 2013-07-18: qty 1

## 2013-07-18 MED ORDER — PROPOFOL 10 MG/ML IV BOLUS
INTRAVENOUS | Status: DC | PRN
  Administered 2013-07-18: 80 mg via INTRAVENOUS

## 2013-07-18 MED ORDER — FENTANYL CITRATE 0.05 MG/ML IJ SOLN
INTRAMUSCULAR | Status: DC | PRN
  Administered 2013-07-18: 150 ug via INTRAVENOUS

## 2013-07-18 MED ORDER — SODIUM CHLORIDE 0.9 % IR SOLN
Status: DC | PRN
  Administered 2013-07-18: 1000 mL

## 2013-07-18 MED ORDER — MORPHINE SULFATE 4 MG/ML IJ SOLN
INTRAMUSCULAR | Status: AC
  Administered 2013-07-18: 4 mg
  Filled 2013-07-18: qty 1

## 2013-07-18 MED ORDER — CHLORHEXIDINE GLUCONATE 4 % EX LIQD: 1.0000 "application " | Freq: Once | CUTANEOUS | Status: DC

## 2013-07-18 MED ORDER — DROPERIDOL 2.5 MG/ML IJ SOLN: 0.6250 mg | INTRAMUSCULAR | Status: DC | PRN

## 2013-07-18 MED ORDER — MORPHINE SULFATE 2 MG/ML IJ SOLN
2.0000 mg | INTRAMUSCULAR | Status: DC | PRN
  Administered 2013-07-18: 2 mg via INTRAVENOUS
  Administered 2013-07-18: 4 mg via INTRAVENOUS
  Filled 2013-07-18: qty 1
  Filled 2013-07-18: qty 2

## 2013-07-18 MED ORDER — HEMOSTATIC AGENTS (NO CHARGE) OPTIME
TOPICAL | Status: DC | PRN
  Administered 2013-07-18: 1 via TOPICAL

## 2013-07-18 MED ORDER — LACTATED RINGERS IV SOLN
INTRAVENOUS | Status: DC
  Administered 2013-07-18 (×2): via INTRAVENOUS

## 2013-07-18 MED ORDER — CIPROFLOXACIN IN D5W 400 MG/200ML IV SOLN
400.0000 mg | Freq: Two times a day (BID) | INTRAVENOUS | Status: AC
  Administered 2013-07-18: 400 mg via INTRAVENOUS
  Filled 2013-07-18: qty 200

## 2013-07-18 MED ORDER — AMLODIPINE BESYLATE 2.5 MG PO TABS
2.5000 mg | ORAL_TABLET | Freq: Every day | ORAL | Status: DC
Start: 2013-07-19 — End: 2013-07-20
  Administered 2013-07-20: 2.5 mg via ORAL
  Filled 2013-07-18 (×2): qty 1

## 2013-07-18 MED ORDER — LABETALOL HCL 100 MG PO TABS
100.0000 mg | ORAL_TABLET | Freq: Two times a day (BID) | ORAL | Status: DC
  Administered 2013-07-19 – 2013-07-20 (×2): 100 mg via ORAL
  Filled 2013-07-18 (×5): qty 1

## 2013-07-18 MED ORDER — ONDANSETRON HCL 4 MG/2ML IJ SOLN: 4.0000 mg | Freq: Four times a day (QID) | INTRAMUSCULAR | Status: DC | PRN

## 2013-07-18 MED ORDER — BUPIVACAINE-EPINEPHRINE PF 0.25-1:200000 % IJ SOLN
INTRAMUSCULAR | Status: AC
  Filled 2013-07-18: qty 30

## 2013-07-18 MED ORDER — HYDROMORPHONE HCL PF 1 MG/ML IJ SOLN
0.5000 mg | INTRAMUSCULAR | Status: DC | PRN
  Administered 2013-07-18 (×2): 0.5 mg via INTRAVENOUS

## 2013-07-18 SURGICAL SUPPLY — 50 items
APL SKNCLS STERI-STRIP NONHPOA (GAUZE/BANDAGES/DRESSINGS) ×2
APPLIER CLIP ROT 10 11.4 M/L (STAPLE) ×3
APR CLP MED LRG 11.4X10 (STAPLE) ×2
BAG SPEC RTRVL LRG 6X4 10 (ENDOMECHANICALS) ×2
BANDAGE ADHESIVE 1X3 (GAUZE/BANDAGES/DRESSINGS) ×8 IMPLANT
BENZOIN TINCTURE PRP APPL 2/3 (GAUZE/BANDAGES/DRESSINGS) ×3 IMPLANT
BLADE SURG ROTATE 9660 (MISCELLANEOUS) IMPLANT
CANISTER SUCTION 2500CC (MISCELLANEOUS) ×3 IMPLANT
CHLORAPREP W/TINT 26ML (MISCELLANEOUS) ×3 IMPLANT
CLIP APPLIE ROT 10 11.4 M/L (STAPLE) ×2 IMPLANT
COVER MAYO STAND STRL (DRAPES) ×3 IMPLANT
COVER SURGICAL LIGHT HANDLE (MISCELLANEOUS) ×3 IMPLANT
DECANTER SPIKE VIAL GLASS SM (MISCELLANEOUS) ×6 IMPLANT
DRAPE C-ARM 42X72 X-RAY (DRAPES) ×3 IMPLANT
DRAPE UTILITY 15X26 W/TAPE STR (DRAPE) ×6 IMPLANT
DRSG TEGADERM 2-3/8X2-3/4 SM (GAUZE/BANDAGES/DRESSINGS) ×3 IMPLANT
DRSG TEGADERM 4X4.75 (GAUZE/BANDAGES/DRESSINGS) ×3 IMPLANT
ELECT REM PT RETURN 9FT ADLT (ELECTROSURGICAL) ×3
ELECTRODE REM PT RTRN 9FT ADLT (ELECTROSURGICAL) ×2 IMPLANT
FILTER SMOKE EVAC LAPAROSHD (FILTER) ×3 IMPLANT
GAUZE SPONGE 2X2 8PLY STRL LF (GAUZE/BANDAGES/DRESSINGS) ×2 IMPLANT
GLOVE BIO SURGEON STRL SZ7 (GLOVE) ×3 IMPLANT
GLOVE BIOGEL PI IND STRL 6.5 (GLOVE) ×1 IMPLANT
GLOVE BIOGEL PI IND STRL 7.5 (GLOVE) ×2 IMPLANT
GLOVE BIOGEL PI INDICATOR 6.5 (GLOVE) ×1
GLOVE BIOGEL PI INDICATOR 7.5 (GLOVE) ×1
GLOVE ORTHOPEDIC STR SZ6.5 (GLOVE) ×2 IMPLANT
GLOVE SURG SIGNA 7.5 PF LTX (GLOVE) ×2 IMPLANT
GOWN STRL NON-REIN LRG LVL3 (GOWN DISPOSABLE) ×12 IMPLANT
GOWN STRL REIN XL XLG (GOWN DISPOSABLE) ×2 IMPLANT
HEMOSTAT SNOW SURGICEL 2X4 (HEMOSTASIS) ×2 IMPLANT
KIT BASIN OR (CUSTOM PROCEDURE TRAY) ×3 IMPLANT
KIT ROOM TURNOVER OR (KITS) ×3 IMPLANT
NS IRRIG 1000ML POUR BTL (IV SOLUTION) ×3 IMPLANT
PAD ARMBOARD 7.5X6 YLW CONV (MISCELLANEOUS) ×3 IMPLANT
POUCH SPECIMEN RETRIEVAL 10MM (ENDOMECHANICALS) ×3 IMPLANT
SCISSORS LAP 5X35 DISP (ENDOMECHANICALS) IMPLANT
SET CHOLANGIOGRAPH 5 50 .035 (SET/KITS/TRAYS/PACK) ×3 IMPLANT
SET IRRIG TUBING LAPAROSCOPIC (IRRIGATION / IRRIGATOR) ×3 IMPLANT
SLEEVE ENDOPATH XCEL 5M (ENDOMECHANICALS) ×3 IMPLANT
SPECIMEN JAR SMALL (MISCELLANEOUS) ×3 IMPLANT
SPONGE GAUZE 2X2 STER 10/PKG (GAUZE/BANDAGES/DRESSINGS) ×1
STRIP CLOSURE SKIN 1/2X4 (GAUZE/BANDAGES/DRESSINGS) ×2 IMPLANT
SUT MNCRL AB 4-0 PS2 18 (SUTURE) ×3 IMPLANT
TOWEL OR 17X24 6PK STRL BLUE (TOWEL DISPOSABLE) ×3 IMPLANT
TOWEL OR 17X26 10 PK STRL BLUE (TOWEL DISPOSABLE) ×3 IMPLANT
TRAY LAPAROSCOPIC (CUSTOM PROCEDURE TRAY) ×3 IMPLANT
TROCAR XCEL BLUNT TIP 100MML (ENDOMECHANICALS) ×3 IMPLANT
TROCAR XCEL NON-BLD 11X100MML (ENDOMECHANICALS) ×3 IMPLANT
TROCAR XCEL NON-BLD 5MMX100MML (ENDOMECHANICALS) ×3 IMPLANT

## 2013-07-18 NOTE — Anesthesia Procedure Notes (Signed)
Procedure Name: Intubation Date/Time: 07/18/2013 12:02 PM Performed by: Manuela Schwartz B Pre-anesthesia Checklist: Patient identified, Emergency Drugs available, Suction available, Patient being monitored and Timeout performed Patient Re-evaluated:Patient Re-evaluated prior to inductionOxygen Delivery Method: Circle system utilized Preoxygenation: Pre-oxygenation with 100% oxygen Intubation Type: IV induction Ventilation: Mask ventilation without difficulty Laryngoscope Size: Mac and 3 Grade View: Grade III Tube type: Oral Tube size: 7.5 mm Number of attempts: 2 Airway Equipment and Method: Bougie stylet Placement Confirmation: positive ETCO2 and breath sounds checked- equal and bilateral Secured at: 21 cm Tube secured with: Tape Dental Injury: Teeth and Oropharynx as per pre-operative assessment

## 2013-07-18 NOTE — Progress Notes (Signed)
Pt cont to c/o svere pain after 140mcg fentanyl given dt jackson here and notified additional orders obtained and carried out

## 2013-07-18 NOTE — Preoperative (Signed)
Beta Blockers   Reason not to administer Beta Blockers:Not Applicable 

## 2013-07-18 NOTE — H&P (View-Only) (Signed)
Patient ID: [name] [name], female   DOB: 1933/08/19, 79 y.o.   MRN: [id number]  Chief Complaint  Patient presents with  . New Evaluation    cholecystitis    HPI [name] [name] is a 79 y.o. female.  Referred by Dr. Erskine Emery for evaluation of gallstone pancreatitis  HPI This is a 79 year old female with multiple medical problems who is status post severe acute pancreatitis in May of 2014.Her hospitalization was complicated by an acute stroke. She has had resolution of most of her stroke symptoms but remains slightly weak on her right side. Currently she is not having A. Abdominal symptoms. In July she had a CT as well as MR of the abdomen.  This confirmed a 3.6 x 8.5 cm pancreatic pseudocyst along the Pancreatic body. She had a recent CT scan last week which showed That the cyst was down to 2.7 x 8.0 cm. The patient remains asymptomatic. She is tolerating a regular diet without difficulty. She does have a significant amount constipation.  These scans also showed a 12 mm gallstone within the gallbladder.  Past Medical History  Diagnosis Date  . Hypertension   . Hyperlipidemia   . Palpitations   . Thyroid disease   . Weakness generalized   . Anemia   . Other urinary problems   . Lightheadedness   . Dyspnea     w/ atypical upper airway symptoms? all vcd/lpr/gerd  . Breast pain   . Vasovagal syncope   . Cellulitis and abscess of unspecified digit   . Urinary tract infection, site not specified   . Urgency of urination   . Other symptoms involving abdomen and pelvis(789.9)   . Rash and other nonspecific skin eruption   . Unspecified venous (peripheral) insufficiency   . Edema   . Osteoarthrosis, unspecified whether generalized or localized, unspecified site   . Monoclonal paraproteinemia   . Irritable bowel syndrome   . Other B-complex deficiencies   . Unspecified hypothyroidism   . GERD (gastroesophageal reflux disease)   . Anxiety   . Torus palatinus   . Stroke   .  Pancreatitis   . Renal insufficiency     Past Surgical History  Procedure Laterality Date  . Back surgery    . Shoulder surgery    . Cholecystectomy    . Abdominal hysterectomy    . Vaginosacropexy    . Suprapubic bladder suspension    . Tee without cardioversion N/A 01/16/2013    Procedure: TRANSESOPHAGEAL ECHOCARDIOGRAM (TEE);  Surgeon: Josue Hector, MD;  Location: [facility] ENDOSCOPY;  Service: Cardiovascular;  Laterality: N/A;    Family History  Problem Relation Age of Onset  . Hypertension Other   . Hypertension Father   . Heart Problems Father     Social History History  Substance Use Topics  . Smoking status: Never Smoker   . Smokeless tobacco: Never Used  . Alcohol Use: Yes     Comment: Occasionally during the night to help sleep    Allergies  Allergen Reactions  . Amlodipine Besylate     REACTION: edema  . Cefuroxime Axetil     REACTION: nausea  . Nitrofurantoin     REACTION: nausea  . Sulfadiazine Rash    Current Outpatient Prescriptions  Medication Sig Dispense Refill  . amLODipine (NORVASC) 2.5 MG tablet Take 1/2 tablet daily  30 tablet  6  . aspirin 325 MG tablet Take 1 tablet (325 mg total) by mouth daily.      Marland Kitchen  cholecalciferol (VITAMIN D) 1000 UNITS tablet Take 1,000 Units by mouth daily.        . cyanocobalamin (,VITAMIN B-12,) 1000 MCG/ML injection Inject 1 mL (1,000 mcg total) into the skin every 14 (fourteen) days.  30 mL  1  . folic acid (FOLVITE) A999333 MCG tablet Take 400 mcg by mouth daily.      Marland Kitchen labetalol (NORMODYNE) 200 MG tablet Take 100 mg by mouth 2 (two) times daily.      Marland Kitchen levothyroxine (SYNTHROID, LEVOTHROID) 88 MCG tablet Take 88 mcg by mouth daily before breakfast.      . omeprazole (PRILOSEC) 40 MG capsule Take 1 capsule (40 mg total) by mouth daily.  90 capsule  3  . triamcinolone ointment (KENALOG) 0.5 % Apply topically 3 (three) times daily.  90 g  1  . zaleplon (SONATA) 10 MG capsule        No current facility-administered  medications for this visit.    Review of Systems Review of Systems  Constitutional: Negative for fever, chills and unexpected weight change.  HENT: Negative for congestion, hearing loss, sore throat, trouble swallowing and voice change.   Eyes: Negative for visual disturbance.  Respiratory: Negative for cough and wheezing.   Cardiovascular: Negative for chest pain, palpitations and leg swelling.  Gastrointestinal: Positive for constipation and abdominal distention. Negative for nausea, vomiting, abdominal pain, diarrhea, blood in stool and anal bleeding.  Genitourinary: Negative for hematuria, vaginal bleeding and difficulty urinating.  Musculoskeletal: Negative for arthralgias.  Skin: Negative for rash and wound.  Neurological: Negative for seizures, syncope and headaches.  Hematological: Negative for adenopathy. Does not bruise/bleed easily.  Psychiatric/Behavioral: Negative for confusion.    Blood pressure 128/60, pulse 60, resp. rate 16, height 5\' 3"  (1.6 m), weight 138 lb 9.6 oz (62.869 kg).  Physical Exam Physical Exam WDWN in NAD HEENT:  EOMI, sclera anicteric; large torus palatinus Neck:  No masses, no thyromegaly Lungs:  CTA bilaterally; normal respiratory effort CV:  Regular rate and rhythm; no murmurs Abd:  +bowel sounds, soft, non-tender, no masses Ext:  Well-perfused; no edema Skin:  Warm, dry; no sign of jaundice  Data Reviewed Ct Abdomen W Contrast  07/03/2013   CLINICAL DATA:  Re-evaluate pancreatic pseudocyst.  EXAM: CT ABDOMEN WITH CONTRAST  TECHNIQUE: Multidetector CT imaging of the abdomen was performed using the standard protocol following bolus administration of intravenous contrast.  CONTRAST:  68mL OMNIPAQUE IOHEXOL 300 MG/ML  SOLN  COMPARISON:  CT of the abdomen and pelvis 05/07/2013.  FINDINGS: Lung Bases: Patchy areas of subsegmental atelectasis and/or scarring are noted throughout the lung bases bilaterally, similar to the prior examination. Mild  calcification of the mitral annulus.  Abdomen: Again noted is a large ovoid shaped low attenuation collection and extending the length of the pancreatic body measuring 8.0 x 2.7 cm on today's examination, likely to represent a large pancreatic pseudocyst. There is a smaller collection measuring approximately 1.8 cm in the tail of the pancreas, also similar to the prior examination. It is uncertain whether or not these 2 collections intercommunicate, or are in fact separate collections. No definite peripancreatic inflammatory changes at this time to suggest active acute pancreatitis. No definite pancreatic ductal dilatation. No definite enhancing pancreatic lesion.  12 mm calcified gallstone in the neck of the gallbladder. No current findings to suggest acute cholecystitis at this time. The appearance of the liver, spleen, bilateral adrenal glands and bilateral kidneys is unremarkable. Within the visualized peritoneal cavity there is no significant volume  of ascites, no pneumoperitoneum and no pathologic distention of small bowel. No definite lymphadenopathy identified.  Musculoskeletal: Postoperative changes of PLIF at L4-L5. There are no aggressive appearing lytic or blastic lesions noted in the visualized portions of the skeleton.  IMPRESSION: 1. Large pancreatic pseudocyst in the body of the pancreas an smaller lesion in the tail of the pancreas (which may intercommunicate), similar to the prior study, as discussed above. No new pancreatic pseudocysts or findings to suggest active acute pancreatitis are identified on today's examination. 2. Cholelithiasis without evidence to suggest acute cholecystitis at this time. 3. Additional incidental findings, similar to prior studies, as above.   Electronically Signed   By: Vinnie Langton M.D.   On: 07/03/2013 17:04   04/05/13  *RADIOLOGY REPORT*  Clinical Data: Re-evaluate pancreatic pseudocyst  CT ABDOMEN AND PELVIS WITHOUT CONTRAST  Technique: Multidetector CT  imaging of the abdomen and pelvis was  performed following the standard protocol without intravenous  contrast.  Comparison: MRI abdomen dated 03/19/2013. CT abdomen pelvis dated  03/18/2013.  Findings: Lung bases are essentially clear.  Fluid within the distal esophagus, raising the possibility of  esophageal dysmotility or gastroesophageal reflux.  Unenhanced liver, spleen, and adrenal glands within normal limits.  3.1 x 8.4 cm fluid density lesion along the pancreatic body (series  2/image 26), previously 3.6 x 8.5 cm, measuring at the upper limits  of simple fluid density and compatible with a pancreatic  pseudocyst. No peripancreatic inflammatory changes by CT.  Cholelithiasis, without associated inflammatory changes. No  intrahepatic or extrahepatic ductal dilatation.  Kidneys are within normal limits. No renal calculi or  hydronephrosis.  No evidence of bowel obstruction. Colonic diverticulosis, without  associated inflammatory changes.  Atherosclerotic calcifications of the abdominal aorta and branch  vessels.  Collateral vessels in the left upper abdomen with additional  vessels along the left posterolateral abdominal  wall/retroperitoneum (series 2/image 40), unchanged.  No abdominopelvic ascites.  No suspicious abdominopelvic lymphadenopathy.  Status post hysterectomy. Bilateral ovaries are unremarkable.  No ureteral or bladder calculi. Bladder is within normal limits.  Degenerative changes of the visualized thoracolumbar spine. Status  post PLIF at L4-5.  IMPRESSION:  Suspected 8.4 cm pseudocyst in the pancreatic body, stable versus  mildly decreased.  While the intraparenchymal location is less typical, the lack of  interval progression is reassuring.  Cholelithiasis, without associated inflammatory changes.  Original Report Authenticated By: Julian Hy, M.D.   Lab Results  Component Value Date   WBC 5.6 04/01/2013   HGB 9.4* 04/01/2013   HCT 27.8* 04/01/2013    MCV 92.1 04/01/2013   PLT 207.0 04/01/2013   Lab Results  Component Value Date   ALT 19 07/01/2013   AST 18 07/01/2013   ALKPHOS 53 07/01/2013   BILITOT 0.5 07/01/2013   Lab Results  Component Value Date   CREATININE 1.7* 07/01/2013   BUN 29* 07/01/2013   NA 140 07/01/2013   K 4.3 07/01/2013   CL 105 07/01/2013   CO2 26 07/01/2013   Lab Results  Component Value Date   LIPASE 27.0 04/01/2013    Assessment    Cholelithiasis with recent acute pancreatitis     Plan    Cardiac clearance, then proceed with Laparoscopic cholecystectomy with intraoperative cholangiogram. The surgical procedure has been discussed with the patient.  Potential risks, benefits, alternative treatments, and expected outcomes have been explained.  All of the patient's questions at this time have been answered.  The likelihood of reaching the patient's treatment goal  is good.  The patient understand the proposed surgical procedure and wishes to proceed. At this time, it appears that the pseudocyst is getting smaller. Since she is asymptomatic, would recommend leaving the pancreas alone and following up with a another CT scan in about 6 months.        Imagene Boss K. 07/09/2013, 3:08 PM

## 2013-07-18 NOTE — Progress Notes (Signed)
pts hr drops to 38 asymamatic dr Glennon Mac notified no new orders obtained will continue to [facility]

## 2013-07-18 NOTE — Anesthesia Postprocedure Evaluation (Signed)
  Anesthesia Post-op Note  Patient: Emmajo S Gavitt  Procedure(s) Performed: Procedure(s): LAPAROSCOPIC CHOLECYSTECTOMY (N/A)  Patient Location: PACU  Anesthesia Type:General  Level of Consciousness: awake, alert , oriented and patient cooperative  Airway and Oxygen Therapy: Patient Spontanous Breathing and Patient connected to nasal cannula oxygen  Post-op Pain: mild  Post-op Assessment: Post-op Vital signs reviewed, Patient's Cardiovascular Status Stable, Respiratory Function Stable, Patent Airway, No signs of Nausea or vomiting and Pain level controlled  Post-op Vital Signs: Reviewed and stable  Complications: No apparent anesthesia complications

## 2013-07-18 NOTE — Interval H&P Note (Signed)
History and Physical Interval Note:  07/18/2013 11:09 AM  [name] [name]  has presented today for surgery, with the diagnosis of Chronic calculus cholecystitis  The various methods of treatment have been discussed with the patient and family. After consideration of risks, benefits and other options for treatment, the patient has consented to  Procedure(s): LAPAROSCOPIC CHOLECYSTECTOMY WITH INTRAOPERATIVE CHOLANGIOGRAM (N/A) as a surgical intervention .  The patient's history has been reviewed, patient examined, no change in status, stable for surgery.  I have reviewed the patient's chart and labs.  Questions were answered to the patient's satisfaction.     Ariadne Rissmiller K.

## 2013-07-18 NOTE — Progress Notes (Signed)
Care of pt assumed by MA Mahkai Fangman RN 

## 2013-07-18 NOTE — Transfer of Care (Signed)
Immediate Anesthesia Transfer of Care Note  Patient: [name] [name]  Procedure(s) Performed: Procedure(s): LAPAROSCOPIC CHOLECYSTECTOMY (N/A)  Patient Location: PACU  Anesthesia Type:General  Level of Consciousness: awake and alert   Airway & Oxygen Therapy: Patient Spontanous Breathing and Patient connected to nasal cannula oxygen  Post-op Assessment: Report given to PACU RN and Post -op Vital signs reviewed and stable  Post vital signs: Reviewed and stable  Complications: No apparent anesthesia complications

## 2013-07-18 NOTE — Op Note (Signed)
Laparoscopic Cholecystectomy Procedure Note  Indications: This patient presents with symptomatic gallbladder disease and will undergo laparoscopic cholecystectomy.  Pre-operative Diagnosis: Cholelithiasis with recent gallstone pancreatitis  Post-operative Diagnosis: Same  Surgeon: Callahan Wild K.   Assistants: Dr. Coralie Keens  Anesthesia: General endotracheal anesthesia  ASA Class: 3  Procedure Details  The patient was seen again in the Holding Room. The risks, benefits, complications, treatment options, and expected outcomes were discussed with the patient. The possibilities of reaction to medication, pulmonary aspiration, perforation of viscus, bleeding, recurrent infection, finding a normal gallbladder, the need for additional procedures, failure to diagnose a condition, the possible need to convert to an open procedure, and creating a complication requiring transfusion or operation were discussed with the patient. The likelihood of improving the patient's symptoms with return to their baseline status is good.  The patient and/or family concurred with the proposed plan, giving informed consent. The site of surgery properly noted. The patient was taken to Operating Room, identified as [name] [name] and the procedure verified as Laparoscopic Cholecystectomy with Intraoperative Cholangiogram. A Time Out was held and the above information confirmed.  Prior to the induction of general anesthesia, antibiotic prophylaxis was administered. General endotracheal anesthesia was then administered and tolerated well. After the induction, the abdomen was prepped with Chloraprep and draped in sterile fashion. The patient was positioned in the supine position.  Local anesthetic agent was injected into the skin near the umbilicus and an incision made. We dissected down to the abdominal fascia with blunt dissection.  The fascia was incised vertically and we entered the peritoneal cavity bluntly.  A  pursestring suture of 0-Vicryl was placed around the fascial opening.  The Hasson cannula was inserted and secured with the stay suture.  Pneumoperitoneum was then created with CO2 and tolerated well without any adverse changes in the patient's vital signs. An 11-mm port was placed in the subxiphoid position.  Two 5-mm ports were placed in the right upper quadrant. All skin incisions were infiltrated with a local anesthetic agent before making the incision and placing the trocars.   We positioned the patient in reverse Trendelenburg, tilted slightly to the patient's left.  The gallbladder was identified, the fundus grasped and retracted cephalad. Adhesions were lysed bluntly and with the electrocautery where indicated, taking care not to injure any adjacent organs or viscus. The infundibulum was grasped and retracted laterally, exposing the peritoneum overlying the triangle of Calot. This was then divided and exposed in a blunt fashion. The cystic duct was clearly identified and bluntly dissected circumferentially. A critical view of the cystic duct and cystic artery was obtained.  We attempted a cholangiogram, but the duct was too small to allow passage of the cholangiogram catheter.  The cystic duct was then ligated with clips and divided. The cystic artery was, dissected free, ligated with clips and divided as well.   The gallbladder was dissected from the liver bed in retrograde fashion with the electrocautery. The gallbladder was removed and placed in an Endocatch sac. The liver bed was irrigated and inspected. Hemostasis was achieved with the electrocautery and Surgicel SNOW. Copious irrigation was utilized and was repeatedly aspirated until clear.  The gallbladder and Endocatch sac were then removed through the umbilical port site.  The pursestring suture was used to close the umbilical fascia.    We again inspected the right upper quadrant for hemostasis.  Pneumoperitoneum was released as we removed  the trocars.  4-0 Monocryl was used to close the skin.  Benzoin, steri-strips, and clean dressings were applied. The patient was then extubated and brought to the recovery room in stable condition. Instrument, sponge, and needle counts were correct at closure and at the conclusion of the case.   Findings: Cholecystitis with Cholelithiasis  Estimated Blood Loss: less than 100 mL         Drains: none         Specimens: Gallbladder           Complications: None; patient tolerated the procedure well.         Disposition: PACU - hemodynamically stable.         Condition: stable  Imogene Burn. Georgette Dover, MD, [facility]  General/ Trauma Surgery  07/18/2013 12:59 PM

## 2013-07-18 NOTE — Anesthesia Preprocedure Evaluation (Addendum)
Anesthesia Evaluation  Patient identified by MRN, date of birth, ID band Patient awake    Reviewed: Allergy & Precautions, H&P , NPO status , Patient's Chart, lab work & pertinent test results  History of Anesthesia Complications Negative for: history of anesthetic complications  Airway Mallampati: II TM Distance: >3 FB Neck ROM: Full    Dental  (+) Dental Advisory Given   Pulmonary neg pulmonary ROS,  breath sounds clear to auscultation  Pulmonary exam normal       Cardiovascular hypertension, Pt. on medications - Peripheral Vascular Disease Rhythm:Regular Rate:Normal  9/14 myoview: small apical scar with possible mild peri-infarct ischemia, EF 79%  5/14 ECHO: EF 50-55%, with LVH and grade 1 diastolic dysfunction, SAM of mitral valve   Neuro/Psych Anxiety Depression CVA (R hand weakness, speech, vision), Residual Symptoms    GI/Hepatic Neg liver ROS, GERD-  Medicated and Controlled,  Endo/Other  Hypothyroidism   Renal/GU Renal InsufficiencyRenal disease (creat 1.59)     Musculoskeletal   Abdominal   Peds  Hematology  (+) Blood dyscrasia (Hb 9.7), anemia ,   Anesthesia Other Findings   Reproductive/Obstetrics                          Anesthesia Physical Anesthesia Plan  ASA: III  Anesthesia Plan: General   Post-op Pain Management:    Induction: Intravenous  Airway Management Planned: Oral ETT  Additional Equipment:   Intra-op Plan:   Post-operative Plan: Extubation in OR  Informed Consent: I have reviewed the patients History and Physical, chart, labs and discussed the procedure including the risks, benefits and alternatives for the proposed anesthesia with the patient or authorized representative who has indicated his/her understanding and acceptance.   Dental advisory given  Plan Discussed with: CRNA and Surgeon  Anesthesia Plan Comments: (Plan routine monitors, GETA)         Anesthesia Quick Evaluation

## 2013-07-19 MED ORDER — HYDROCODONE-ACETAMINOPHEN 5-325 MG PO TABS: 1.0000 | ORAL_TABLET | ORAL | Status: DC | PRN

## 2013-07-19 MED ORDER — TAMSULOSIN HCL 0.4 MG PO CAPS
0.4000 mg | ORAL_CAPSULE | Freq: Every day | ORAL | Status: DC
  Administered 2013-07-19 – 2013-07-20 (×2): 0.4 mg via ORAL
  Filled 2013-07-19 (×2): qty 1

## 2013-07-19 NOTE — Progress Notes (Signed)
Patient tried to void on her own multiple times.  After trying at 0000, asked if she could try one more time a little later before having I&O cath.  States she "feels like she needs to go, but just can't."  Bladder scanned at 0140 with 533mL of urine noted.  In and Out Straight catheter performed by RN with 700 mL output at 0200.

## 2013-07-19 NOTE — Progress Notes (Signed)
Patient had not voided since receiving an I&O cath at 1:45am. Bladder scanned the patient and it was 729ml according to the scanner. I&O the patient at 11:15am and removed 873ml. Md Cornett was made aware and gave order to place foley and keep the patient until the morning. I expressed to the patient we needed to place a foley. She desired at least give her some time to void on her own. She refused placement at this time.

## 2013-07-19 NOTE — Discharge Summary (Signed)
Physician Discharge Summary  Patient ID: [name] [name] MRN: [id number] DOB/AGE: [birth year] 79 y.o.  Admit date: 07/18/2013 Discharge date: 07/19/2013  Admission Diagnoses:  Cholelithiasis    History of acute pancreatitis  Discharge Diagnoses: Same Active Problems:   * No active hospital problems. *   Discharged Condition: good  Hospital Course: Laparoscopic cholecystectomy on 11/13.  Kept overnight for observation due to medical history and comorbidities.  Did well.  Minimal pain on POD #1.  Had some voiding issues and required I&O cath x 1.  Consults: None  Significant Diagnostic Studies: none  Treatments: surgery: lap chole  Discharge Exam: Blood pressure 121/35, pulse 62, temperature 98.3 F (36.8 C), temperature source Oral, resp. rate 18, height 5\' 3"  (1.6 m), weight 138 lb (62.596 kg), SpO2 96.00%. Abdomen - minimal distention; incisional tenderness Dressings c/d/i  Disposition: 01-Home or Self Care  Discharge Orders   Future Appointments Provider Department Dept Phone   08/12/2013 9:10 AM Imogene Burn. Rhys Anchondo, [facility], [place] [phone]   Future Orders Complete By Expires   Call MD for:  persistant nausea and vomiting  As directed    Call MD for:  redness, tenderness, or signs of infection (pain, swelling, redness, odor or green/yellow discharge around incision site)  As directed    Call MD for:  severe uncontrolled pain  As directed    Call MD for:  temperature >100.4  As directed    Diet general  As directed    Driving Restrictions  As directed    Comments:     Do not drive while taking pain medications   Increase activity slowly  As directed    May shower / Bathe  As directed    May walk up steps  As directed        Medication List         amLODipine 2.5 MG tablet  Commonly known as:  NORVASC  Take 1/2 tablet daily     aspirin 325 MG tablet  Take 1 tablet (325 mg total) by mouth daily.     cholecalciferol 1000 UNITS tablet   Commonly known as:  VITAMIN D  Take 1,000 Units by mouth daily.     cyanocobalamin 1000 MCG/ML injection  Commonly known as:  (VITAMIN B-12)  Inject 1 mL (1,000 mcg total) into the skin every 14 (fourteen) days.     vitamin B-12 1000 MCG tablet  Commonly known as:  CYANOCOBALAMIN  Take 1,000 mcg by mouth every evening.     folic acid A999333 MCG tablet  Commonly known as:  FOLVITE  Take 400 mcg by mouth daily.     HYDROcodone-acetaminophen 5-325 MG per tablet  Commonly known as:  NORCO/VICODIN  Take 1-2 tablets by mouth every 4 (four) hours as needed for moderate pain.     ibuprofen 200 MG tablet  Commonly known as:  ADVIL,MOTRIN  Take 200-400 mg by mouth every 6 (six) hours as needed for headache or moderate pain.     labetalol 200 MG tablet  Commonly known as:  NORMODYNE  Take 100 mg by mouth 2 (two) times daily.     levothyroxine 88 MCG tablet  Commonly known as:  SYNTHROID, LEVOTHROID  Take 88 mcg by mouth daily before breakfast.     omeprazole 40 MG capsule  Commonly known as:  PRILOSEC  Take 1 capsule (40 mg total) by mouth daily.     STOOL SOFTENER PO  Take 200 mg by mouth at bedtime as  needed (constipation).     zaleplon 10 MG capsule  Commonly known as:  SONATA  Take 10 mg by mouth at bedtime as needed for sleep.           Follow-up Information   Follow up with Earnie Bechard K., MD. Schedule an appointment as soon as possible for a visit in 3 weeks.   Specialty:  General Surgery   Contact information:   [address] [place] [address] [phone]       Signed: Maia Petties. 07/19/2013, 6:39 AM

## 2013-07-20 ENCOUNTER — Encounter (HOSPITAL_COMMUNITY): Payer: Self-pay | Admitting: Surgery

## 2013-07-20 NOTE — Progress Notes (Signed)
2 Days Post-Op   Assessment: s/p Procedure(s): LAPAROSCOPIC CHOLECYSTECTOMY Patient Active Problem List   Diagnosis Date Noted  . Biliary acute pancreatitis 07/09/2013  . Rash, skin 04/21/2013  . Right leg pain 04/01/2013  . Anemia 04/01/2013  . Nonspecific (abnormal) findings on radiological and other examination of gastrointestinal tract 03/22/2013  . Epigastric abdominal pain 03/18/2013  . Cholelithiasis 03/18/2013  . History of CVA (cerebrovascular accident) 03/18/2013  . History of pancreatitis 03/18/2013  . Dysphagia, unspecified(787.20) 01/17/2013  . Malignant hypertension 01/17/2013  . Other hypertrophic cardiomyopathy 01/17/2013  . Pneumonia 01/16/2013  . Acute pancreatitis 01/08/2013  . Acute renal failure 01/08/2013  . Encephalopathy acute 01/08/2013  . CVA (cerebral infarction) 01/08/2013  . Acute respiratory failure 01/08/2013  . UTI (lower urinary tract infection) 09/15/2011  . Abdominal  pain, other specified site 09/15/2011  . ELEVATED BLOOD PRESSURE 09/22/2010  . NEOPLASM UNCERTAIN BHV LIP ORAL CAVITY&PHARYNX 08/02/2010  . ANXIETY 02/12/2010  . DEPRESSION/ANXIETY 02/12/2010  . GERD 09/17/2009  . OSTEOPENIA 09/17/2009  . DYSPNEA 09/17/2009  . Palpitations 07/07/2009  . Mastodynia 11/19/2008  . VASOVAGAL SYNCOPE 11/17/2008  . URINARY TRACT INFECTION (UTI) 10/27/2008  . PARONYCHIA 10/27/2008  . URINARY URGENCY 10/27/2008  . OTHER SYMPTOMS INVOLVING ABDOMEN AND PELVIS 09/26/2008  . VENOUS INSUFFICIENCY 01/01/2008  . RASH AND OTHER NONSPECIFIC SKIN ERUPTION 01/01/2008  . EDEMA 12/21/2007  . HYPERLIPIDEMIA 07/08/2007  . OSTEOARTHRITIS 07/08/2007  . HYPOTHYROIDISM 04/10/2007  . B12 DEFICIENCY 04/10/2007  . MONOCLONAL GAMMOPATHY 04/10/2007  . ANEMIA-NOS 04/10/2007  . HYPERTENSION 04/10/2007  . IBS 04/10/2007    Improved, but now with Foley catheter  Plan: Continue foley due to acute urinary retention Discharge  Subjective: Feels better today,  sore, but not too bad. Wants to go home would like to get foley out, but has had voiding issues in the past  Objective: Vital signs in last 24 hours: Temp:  [98.8 F (37.1 C)-99.9 F (37.7 C)] 98.8 F (37.1 C) (11/15 0511) Pulse Rate:  [66-87] 72 (11/15 0511) Resp:  [16-20] 16 (11/15 0511) BP: (118-164)/(36-54) 145/47 mmHg (11/15 0511) SpO2:  [87 %-93 %] 93 % (11/15 0516)   Intake/Output from previous day: 11/14 0701 - 11/15 0700 In: 2070 [P.O.:840; I.V.:1155] Out: 2850 [Urine:2850]  General appearance: alert, cooperative and no distress Resp: clear to auscultation bilaterally Cardio: regular rate and rhythm, S1, S2 normal, no murmur, click, rub or gallop GI: Soft and benign  Incision: healing well, no significant drainage  Lab Results:   Recent Labs  07/18/13 0914  WBC 6.4  HGB 9.7*  HCT 28.4*  PLT 198   BMET  Recent Labs  07/18/13 0914  NA 139  K 4.5  CL 103  CO2 26  GLUCOSE 107*  BUN 29*  CREATININE 1.59*  CALCIUM 9.1    MEDS, Scheduled . amLODipine  2.5 mg Oral Daily  . labetalol  100 mg Oral BID  . levothyroxine  88 mcg Oral QAC breakfast  . pantoprazole  40 mg Oral Daily  . tamsulosin  0.4 mg Oral Daily    Studies/Results: No results found.    LOS: 2 days     Haywood Lasso, MD, [facility] Surgery, [place]   07/20/2013 9:56 AM

## 2013-07-20 NOTE — Progress Notes (Signed)
Patient and daughters instructed on foley care.

## 2013-07-22 ENCOUNTER — Ambulatory Visit (INDEPENDENT_AMBULATORY_CARE_PROVIDER_SITE_OTHER): Payer: Medicare Other | Admitting: General Surgery

## 2013-07-22 ENCOUNTER — Telehealth (INDEPENDENT_AMBULATORY_CARE_PROVIDER_SITE_OTHER): Payer: Self-pay

## 2013-07-22 ENCOUNTER — Encounter (INDEPENDENT_AMBULATORY_CARE_PROVIDER_SITE_OTHER): Payer: Self-pay | Admitting: General Surgery

## 2013-07-22 VITALS — BP 146/84 | HR 66 | Temp 97.7°F | Resp 18 | Ht 63.0 in | Wt 142.8 lb

## 2013-07-22 DIAGNOSIS — Z466 Encounter for fitting and adjustment of urinary device: Secondary | ICD-10-CM

## 2013-07-22 NOTE — Patient Instructions (Signed)
Patient and her daughter came in today for a nurse only visit to have a Foley Cath removal, I removed that fluid from the cath with a 10 cc syringe, did it 3 times to make sure that there no fluid left in the cath. I had the patient go to the restroom to see if she can pee and she did. I made her a apt card when she is coming back to see Dr Georgette Dover on 08-12-2013 @ 9:00am. I told her that if she needs me for anything to call back here at the office and ask for me. I talked to the daughter to see why she could not get here earlier she stated because it of the weather and its hard for her mother to get up and going in the morning.

## 2013-07-22 NOTE — Telephone Encounter (Signed)
Spoke to patient's daughter regarding coming into office earlier for foley catheter removal.  Patient's daughter states she cannot bring her mother into office early mornings, daughter states "she has a hard time getting going in the morning"  Called back to try to have patient come in before noon, daughter states she cannot leave [place] before noon and will arrive at our office at 12:45pm-1:00pm.  I made several attempts to have patient come into office early morning but, patient's daughter declined.

## 2013-07-26 ENCOUNTER — Ambulatory Visit (INDEPENDENT_AMBULATORY_CARE_PROVIDER_SITE_OTHER): Payer: Medicare Other | Admitting: Nurse Practitioner

## 2013-07-26 ENCOUNTER — Other Ambulatory Visit: Payer: Medicare Other

## 2013-07-26 ENCOUNTER — Encounter: Payer: Self-pay | Admitting: Nurse Practitioner

## 2013-07-26 VITALS — BP 150/74 | HR 71 | Temp 98.1°F | Ht 63.0 in | Wt 140.2 lb

## 2013-07-26 DIAGNOSIS — R300 Dysuria: Secondary | ICD-10-CM

## 2013-07-26 DIAGNOSIS — R3911 Hesitancy of micturition: Secondary | ICD-10-CM

## 2013-07-26 LAB — POCT URINALYSIS DIPSTICK
Blood, UA: NEGATIVE
Nitrite, UA: NEGATIVE
Protein, UA: NEGATIVE
Spec Grav, UA: 1.01
Urobilinogen, UA: 2

## 2013-07-26 NOTE — Progress Notes (Signed)
Pre-visit discussion using our clinic review tool. No additional management support is needed unless otherwise documented below in the visit note.  

## 2013-07-26 NOTE — Progress Notes (Signed)
  Subjective:    Patient ID: [name] [name], female    DOB: [birth year], 79 y.o.   MRN: [id number]  Urinary Tract Infection  This is a new problem. The current episode started in the past 7 days (last 2 nights having trouble urinating when wakes). The problem occurs intermittently (only at night). The problem has been gradually worsening. Quality: hestancy. The pain is moderate. There has been no fever. She is not sexually active. There is no history of pyelonephritis. Associated symptoms include hesitancy. Pertinent negatives include no chills, discharge, flank pain, frequency, hematuria, nausea, urgency or vomiting. She has tried nothing for the symptoms. Her past medical history is significant for catheterization. surgery 1 week ago. Sent home w/urinary catheter, catheter removed 3 da. Taking pain meds at night.      Review of Systems  Constitutional: Negative for fever, chills, activity change, appetite change and fatigue.  Gastrointestinal: Negative for nausea, vomiting, abdominal pain and abdominal distention.  Genitourinary: Positive for hesitancy and difficulty urinating (only occuring at night after taking narcotic pain meds. No problem urinating during day.). Negative for dysuria, urgency, frequency, hematuria, flank pain, decreased urine volume, vaginal bleeding, vaginal discharge, enuresis, pelvic pain and dyspareunia.  Musculoskeletal: Negative for back pain.       Objective:   Physical Exam  Vitals reviewed. Constitutional: She is oriented to person, place, and time. She appears well-developed and well-nourished. No distress.  Accompanied by daughter  HENT:  Head: Normocephalic and atraumatic.  Eyes: Conjunctivae are normal. Right eye exhibits no discharge. Left eye exhibits no discharge.  Cardiovascular: Normal rate and normal heart sounds.   No murmur heard. Pulmonary/Chest: Effort normal and breath sounds normal. No respiratory distress. She has no wheezes.  Abdominal: She  exhibits distension. She exhibits no mass. There is tenderness. There is no rebound and no guarding.  Examined again after emptied bladder-no distension or tenderness.  Genitourinary:  Pt voided 340 mls while at office. Clear, light yellow, no sediment.  Musculoskeletal: She exhibits no tenderness (no cva tenderness).  Neurological: She is alert and oriented to person, place, and time.  Skin: Skin is warm and dry.  Psychiatric: She has a normal mood and affect. Her behavior is normal. Thought content normal.          Assessment & Plan:  1.. Urinary hesitancy secondary to narcotic pain meds. Occuring at night only, w/narcotic pain med use. - POCT urinalysis dipstick- no leuks, nites, blood, protein - Urine culture-pending Decrease narcotic pain meds as tolerated.   See pt instructions.

## 2013-07-26 NOTE — Patient Instructions (Addendum)
Good news! It does not appear that you have a bladder infection, however, I am sending your urine for culture. If it grows bacteria, I will start an antibiotic. I think the trouble you are having at night is related to pain medicine. As you decrease pain medicine, you should not have a sluggish bladder. Please call if you develop new symptoms or if your symptoms are persistent in spite of cutting back pain medicine. Pleasure to meet you!

## 2013-07-28 LAB — URINE CULTURE

## 2013-07-29 ENCOUNTER — Telehealth: Payer: Self-pay | Admitting: Nurse Practitioner

## 2013-07-29 DIAGNOSIS — N390 Urinary tract infection, site not specified: Secondary | ICD-10-CM

## 2013-07-29 MED ORDER — CIPROFLOXACIN HCL 250 MG PO TABS: 250.0000 mg | ORAL_TABLET | Freq: Two times a day (BID) | ORAL | Status: DC

## 2013-07-29 NOTE — Telephone Encounter (Signed)
Patient notified of results.

## 2013-07-31 ENCOUNTER — Telehealth (INDEPENDENT_AMBULATORY_CARE_PROVIDER_SITE_OTHER): Payer: Self-pay

## 2013-07-31 ENCOUNTER — Encounter (INDEPENDENT_AMBULATORY_CARE_PROVIDER_SITE_OTHER): Payer: Self-pay

## 2013-07-31 NOTE — Telephone Encounter (Signed)
Letter faxed to [facility] to release the patient from post op restrictions and restart her therapy.  [phone]

## 2013-08-12 ENCOUNTER — Ambulatory Visit (INDEPENDENT_AMBULATORY_CARE_PROVIDER_SITE_OTHER): Payer: Medicare Other | Admitting: Surgery

## 2013-08-12 ENCOUNTER — Encounter (INDEPENDENT_AMBULATORY_CARE_PROVIDER_SITE_OTHER): Payer: Self-pay | Admitting: Surgery

## 2013-08-12 VITALS — BP 127/77 | HR 78 | Temp 98.4°F | Resp 14 | Ht 63.0 in | Wt 138.6 lb

## 2013-08-12 DIAGNOSIS — K8020 Calculus of gallbladder without cholecystitis without obstruction: Secondary | ICD-10-CM

## 2013-08-12 DIAGNOSIS — K851 Biliary acute pancreatitis without necrosis or infection: Secondary | ICD-10-CM

## 2013-08-12 DIAGNOSIS — K859 Acute pancreatitis without necrosis or infection, unspecified: Secondary | ICD-10-CM

## 2013-08-12 NOTE — Progress Notes (Signed)
This patient is status post laparoscopic cholecystectomy with intraoperative cholangiogram.  The pathology report showed chronic cholecystitis.  The patient reports that the post-operative pain has resolved.  They have resumed a regular diet without problems and is still having chronic constipation.  On physical examination, all of the incisions are healing well with no signs of infection or bleeding.  Steri-strips have all been removed.  Impression:  The patient is doing well after laparoscopic cholecystectomy for chronic cholecystitis.  Plan:  The patient may resume full activity and regular diet.  They may follow-up with [place] on a PRN basis.   [name] [name]. Georgette Dover, MD, [facility]  General/ Trauma Surgery  08/12/2013 9:26 AM

## 2013-08-23 ENCOUNTER — Telehealth: Payer: Self-pay

## 2013-08-23 DIAGNOSIS — N390 Urinary tract infection, site not specified: Secondary | ICD-10-CM

## 2013-08-23 MED ORDER — CIPROFLOXACIN HCL 250 MG PO TABS: 250.0000 mg | ORAL_TABLET | Freq: Two times a day (BID) | ORAL | Status: DC

## 2013-08-23 NOTE — Telephone Encounter (Signed)
The patient called, she states she believes she has a bladder infection.  She states she is unable to come due to her husband being sick.  She states she has had luck on cipro in the past.

## 2013-08-23 NOTE — Telephone Encounter (Signed)
Pt informed

## 2013-08-23 NOTE — Telephone Encounter (Signed)
Ok. Done. Feel better! Thx

## 2013-09-03 ENCOUNTER — Encounter: Payer: Self-pay | Admitting: Internal Medicine

## 2013-09-03 ENCOUNTER — Other Ambulatory Visit (INDEPENDENT_AMBULATORY_CARE_PROVIDER_SITE_OTHER): Payer: Medicare Other

## 2013-09-03 ENCOUNTER — Ambulatory Visit (INDEPENDENT_AMBULATORY_CARE_PROVIDER_SITE_OTHER): Payer: Medicare Other | Admitting: Internal Medicine

## 2013-09-03 VITALS — BP 130/58 | HR 72 | Temp 97.3°F | Resp 16 | Wt 139.0 lb

## 2013-09-03 DIAGNOSIS — I10 Essential (primary) hypertension: Secondary | ICD-10-CM

## 2013-09-03 DIAGNOSIS — I639 Cerebral infarction, unspecified: Secondary | ICD-10-CM

## 2013-09-03 DIAGNOSIS — N390 Urinary tract infection, site not specified: Secondary | ICD-10-CM

## 2013-09-03 DIAGNOSIS — E538 Deficiency of other specified B group vitamins: Secondary | ICD-10-CM

## 2013-09-03 DIAGNOSIS — I6350 Cerebral infarction due to unspecified occlusion or stenosis of unspecified cerebral artery: Secondary | ICD-10-CM

## 2013-09-03 DIAGNOSIS — I951 Orthostatic hypotension: Secondary | ICD-10-CM

## 2013-09-03 DIAGNOSIS — E039 Hypothyroidism, unspecified: Secondary | ICD-10-CM

## 2013-09-03 DIAGNOSIS — N179 Acute kidney failure, unspecified: Secondary | ICD-10-CM

## 2013-09-03 LAB — CBC WITH DIFFERENTIAL/PLATELET
Basophils Absolute: 0 10*3/uL (ref 0.0–0.1)
Basophils Relative: 0.4 % (ref 0.0–3.0)
Eosinophils Relative: 4.7 % (ref 0.0–5.0)
HCT: 28.6 % — ABNORMAL LOW (ref 36.0–46.0)
Lymphs Abs: 1.5 10*3/uL (ref 0.7–4.0)
MCHC: 34.5 g/dL (ref 30.0–36.0)
MCV: 91.7 fl (ref 78.0–100.0)
Monocytes Absolute: 0.7 10*3/uL (ref 0.1–1.0)
Neutro Abs: 4.5 10*3/uL (ref 1.4–7.7)
RBC: 3.12 Mil/uL — ABNORMAL LOW (ref 3.87–5.11)
WBC: 7.1 10*3/uL (ref 4.5–10.5)

## 2013-09-03 LAB — URINALYSIS, ROUTINE W REFLEX MICROSCOPIC
Bilirubin Urine: NEGATIVE
Hgb urine dipstick: NEGATIVE
Ketones, ur: NEGATIVE
Nitrite: POSITIVE — AB
Total Protein, Urine: NEGATIVE
Urine Glucose: NEGATIVE
Urobilinogen, UA: 0.2 (ref 0.0–1.0)

## 2013-09-03 LAB — BASIC METABOLIC PANEL
BUN: 29 mg/dL — ABNORMAL HIGH (ref 6–23)
Calcium: 9.2 mg/dL (ref 8.4–10.5)
Creatinine, Ser: 1.8 mg/dL — ABNORMAL HIGH (ref 0.4–1.2)
GFR: 28.49 mL/min — ABNORMAL LOW (ref >60.00)
Glucose, Bld: 102 mg/dL — ABNORMAL HIGH (ref 70–99)

## 2013-09-03 LAB — TSH: TSH: 1.44 u[IU]/mL (ref 0.35–5.50)

## 2013-09-03 LAB — VITAMIN B12: Vitamin B-12: 1150 pg/mL — ABNORMAL HIGH (ref 211–911)

## 2013-09-03 NOTE — Patient Instructions (Addendum)
Supine BP 150/70 Standing BP 130/60   Hold Labetalol x 1-2 weeks   Use over-the-counter  "cold" medicines  such as  "Afrin" nasal spray for nasal congestion as directed instead. Use" Delsym" or" Robitussin" cough syrup varietis for cough.  You can use plain "Tylenol" or "Advil" for fever, chills and achyness.  Please, make an appointment if you are not better or if you're worse.

## 2013-09-03 NOTE — Assessment & Plan Note (Signed)
Continue with current prescription therapy as reflected on the Med list. Labs  

## 2013-09-03 NOTE — Assessment & Plan Note (Signed)
Continue with current prescription therapy as reflected on the Med list.  

## 2013-09-03 NOTE — Assessment & Plan Note (Signed)
Supine BP 150/70 Standing BP 130/60 Hold Labetalol

## 2013-09-03 NOTE — Progress Notes (Signed)
Pre visit review using our clinic review tool, if applicable. No additional management support is needed unless otherwise documented below in the visit note. 

## 2013-09-03 NOTE — Progress Notes (Signed)
  Subjective:    HPI  C/o URI x 2 wks  C/o dizziness when getting up C/o HA R>L occipital   F/u UTI - took Cipro  The patient is here to follow up on chronic depression, anxiety, headaches and chronic moderate fibromyalgia symptoms controlled with medicines  F/u CVA, GERD, fatigue, gammopathy  Wt Readings from Last 3 Encounters:  09/03/13 139 lb (63.05 kg)  08/12/13 138 lb 9.6 oz (62.869 kg)  07/26/13 140 lb 3.2 oz (63.594 kg)   BP Readings from Last 3 Encounters:  09/03/13 130/58  08/12/13 127/77  07/26/13 150/74   Supine BP 150/70 Standing BP 130/60   Review of Systems  Constitutional: Positive for fatigue. Negative for chills, activity change, appetite change and unexpected weight change.  HENT: Negative for congestion, mouth sores and sinus pressure.   Eyes: Negative for visual disturbance.  Respiratory: Negative for cough and chest tightness.   Gastrointestinal: Negative for nausea and abdominal pain.  Genitourinary: Negative for frequency, difficulty urinating and vaginal pain.  Musculoskeletal: Negative for back pain and gait problem.  Skin: Negative for pallor and rash.  Neurological: Negative for dizziness, tremors, weakness, numbness and headaches.  Psychiatric/Behavioral: Negative for confusion and sleep disturbance. The patient is nervous/anxious.        Objective:   Physical Exam  Constitutional: She appears well-developed and well-nourished. No distress.  HENT:  Head: Normocephalic.  Right Ear: External ear normal.  Left Ear: External ear normal.  Nose: Nose normal.  Mouth/Throat: Oropharynx is clear and moist.  Eyes: Conjunctivae are normal. Pupils are equal, round, and reactive to light. Right eye exhibits no discharge. Left eye exhibits no discharge.  Neck: Normal range of motion. Neck supple. No JVD present. No tracheal deviation present. No thyromegaly present.  Cardiovascular: Normal rate, regular rhythm and normal heart sounds.    Pulmonary/Chest: No stridor. No respiratory distress. She has no wheezes.  Abdominal: Soft. Bowel sounds are normal. She exhibits no distension and no mass. There is no tenderness. There is no rebound and no guarding.  Musculoskeletal: She exhibits no edema and no tenderness.  Lymphadenopathy:    She has no cervical adenopathy.  Neurological: She displays normal reflexes. No cranial nerve deficit. She exhibits normal muscle tone. Coordination normal.  Skin: No rash noted. No erythema.  Psychiatric: She has a normal mood and affect. Her behavior is normal. Judgment and thought content normal.    Lab Results  Component Value Date   WBC 6.4 07/18/2013   HGB 9.7* 07/18/2013   HCT 28.4* 07/18/2013   PLT 198 07/18/2013   GLUCOSE 107* 07/18/2013   CHOL 110 01/08/2013   TRIG 102 01/08/2013   HDL 13* 01/08/2013   LDLDIRECT 172.2 12/21/2012   LDLCALC 77 01/08/2013   ALT 19 07/01/2013   AST 18 07/01/2013   NA 139 07/18/2013   K 4.5 07/18/2013   CL 103 07/18/2013   CREATININE 1.59* 07/18/2013   BUN 29* 07/18/2013   CO2 26 07/18/2013   TSH 0.56 04/01/2013   INR 1.08 03/19/2013   HGBA1C 5.7* 01/08/2013         Assessment & Plan:

## 2013-09-03 NOTE — Assessment & Plan Note (Signed)
Poss due to illness Supine BP 150/70 Standing BP 130/60

## 2013-09-03 NOTE — Assessment & Plan Note (Signed)
Pt just took Cipro UA and Cx

## 2013-09-05 ENCOUNTER — Encounter: Payer: Self-pay | Admitting: Internal Medicine

## 2013-09-06 ENCOUNTER — Telehealth: Payer: Self-pay | Admitting: *Deleted

## 2013-09-06 NOTE — Telephone Encounter (Signed)
[name] [name] called states [name] [name] was taken off BP medication on 12.30.14 due to dizziness however [name] [name] has began having elevated BP 170/68 in the evening.  [name] [name] is requesting that [name] [name] take at least half tablet.  Please advise

## 2013-09-08 NOTE — Telephone Encounter (Signed)
Ok to re-start 1/2 tab. Was the dizziness any better off BP med? Thx

## 2013-09-09 NOTE — Telephone Encounter (Signed)
Spoke with pts daughter advised of MDs message.  She states the dizziness has improved some.  Please advise

## 2013-09-23 ENCOUNTER — Encounter: Payer: Self-pay | Admitting: Internal Medicine

## 2013-09-23 ENCOUNTER — Other Ambulatory Visit (INDEPENDENT_AMBULATORY_CARE_PROVIDER_SITE_OTHER): Payer: Medicare Other

## 2013-09-23 ENCOUNTER — Ambulatory Visit (INDEPENDENT_AMBULATORY_CARE_PROVIDER_SITE_OTHER): Payer: Medicare Other | Admitting: Internal Medicine

## 2013-09-23 VITALS — BP 170/75 | HR 84 | Temp 101.0°F | Resp 16 | Wt 139.0 lb

## 2013-09-23 DIAGNOSIS — N390 Urinary tract infection, site not specified: Secondary | ICD-10-CM

## 2013-09-23 DIAGNOSIS — R509 Fever, unspecified: Secondary | ICD-10-CM

## 2013-09-23 LAB — URINALYSIS, ROUTINE W REFLEX MICROSCOPIC
Bilirubin Urine: NEGATIVE
Ketones, ur: NEGATIVE
Nitrite: POSITIVE — AB
SPECIFIC GRAVITY, URINE: 1.025 (ref 1.000–1.030)
Total Protein, Urine: 30 — AB
URINE GLUCOSE: NEGATIVE
UROBILINOGEN UA: 0.2 (ref 0.0–1.0)
pH: 5.5 (ref 5.0–8.0)

## 2013-09-23 MED ORDER — OLMESARTAN MEDOXOMIL 20 MG PO TABS: 20.0000 mg | ORAL_TABLET | Freq: Every day | ORAL | Status: DC

## 2013-09-23 MED ORDER — LEVOFLOXACIN 500 MG PO TABS: 500.0000 mg | ORAL_TABLET | Freq: Every day | ORAL | Status: DC

## 2013-09-23 NOTE — Progress Notes (Signed)
   Subjective:    HPI  C/o URI x 2-3 d  F/u dizziness when getting up- resolved off BP Rx F/u HA R>L occipital - resolved   F/u UTI - no relapsed  The patient is here to follow up on chronic depression, anxiety, headaches and chronic moderate fibromyalgia symptoms controlled with medicines  F/u CVA, GERD, fatigue, gammopathy  Wt Readings from Last 3 Encounters:  09/23/13 139 lb (63.05 kg)  09/03/13 139 lb (63.05 kg)  08/12/13 138 lb 9.6 oz (62.869 kg)   BP Readings from Last 3 Encounters:  09/23/13 210/86  09/03/13 130/58  08/12/13 127/77      Review of Systems  Constitutional: Positive for fatigue. Negative for chills, activity change, appetite change and unexpected weight change.  HENT: Negative for congestion, mouth sores and sinus pressure.   Eyes: Negative for visual disturbance.  Respiratory: Negative for cough and chest tightness.   Gastrointestinal: Negative for nausea and abdominal pain.  Genitourinary: Negative for frequency, difficulty urinating and vaginal pain.  Musculoskeletal: Negative for back pain and gait problem.  Skin: Negative for pallor and rash.  Neurological: Negative for dizziness, tremors, weakness, numbness and headaches.  Psychiatric/Behavioral: Negative for confusion and sleep disturbance. The patient is nervous/anxious.        Objective:   Physical Exam  Constitutional: She appears well-developed and well-nourished. No distress.  HENT:  Head: Normocephalic.  Right Ear: External ear normal.  Left Ear: External ear normal.  Nose: Nose normal.  Mouth/Throat: Oropharynx is clear and moist.  Eyes: Conjunctivae are normal. Pupils are equal, round, and reactive to light. Right eye exhibits no discharge. Left eye exhibits no discharge.  Neck: Normal range of motion. Neck supple. No JVD present. No tracheal deviation present. No thyromegaly present.  Cardiovascular: Normal rate, regular rhythm and normal heart sounds.   Pulmonary/Chest: No  stridor. No respiratory distress. She has no wheezes.  Abdominal: Soft. Bowel sounds are normal. She exhibits no distension and no mass. There is no tenderness. There is no rebound and no guarding.  Musculoskeletal: She exhibits no edema and no tenderness.  Lymphadenopathy:    She has no cervical adenopathy.  Neurological: She displays normal reflexes. No cranial nerve deficit. She exhibits normal muscle tone. Coordination normal.  Skin: No rash noted. No erythema.  Psychiatric: She has a normal mood and affect. Her behavior is normal. Judgment and thought content normal.    Lab Results  Component Value Date   WBC 7.1 09/03/2013   HGB 9.9* 09/03/2013   HCT 28.6* 09/03/2013   PLT 223.0 09/03/2013   GLUCOSE 102* 09/03/2013   CHOL 110 01/08/2013   TRIG 102 01/08/2013   HDL 13* 01/08/2013   LDLDIRECT 172.2 12/21/2012   LDLCALC 77 01/08/2013   ALT 19 07/01/2013   AST 18 07/01/2013   NA 138 09/03/2013   K 4.7 09/03/2013   CL 106 09/03/2013   CREATININE 1.8* 09/03/2013   BUN 29* 09/03/2013   CO2 23 09/03/2013   TSH 1.44 09/03/2013   INR 1.08 03/19/2013   HGBA1C 5.7* 01/08/2013         Assessment & Plan:

## 2013-09-23 NOTE — Assessment & Plan Note (Signed)
Labs

## 2013-09-23 NOTE — Assessment & Plan Note (Signed)
Leva[facility] Labs

## 2013-09-23 NOTE — Progress Notes (Signed)
Pre visit review using our clinic review tool, if applicable. No additional management support is needed unless otherwise documented below in the visit note. 

## 2013-09-24 ENCOUNTER — Encounter: Payer: Self-pay | Admitting: Internal Medicine

## 2013-09-25 ENCOUNTER — Other Ambulatory Visit: Payer: Self-pay | Admitting: Internal Medicine

## 2013-09-25 LAB — CULTURE, URINE COMPREHENSIVE: Colony Count: >=100000

## 2013-09-25 MED ORDER — NITROFURANTOIN MONOHYD MACRO 100 MG PO CAPS: 100.0000 mg | ORAL_CAPSULE | Freq: Two times a day (BID) | ORAL | Status: DC

## 2013-09-26 ENCOUNTER — Telehealth: Payer: Self-pay | Admitting: Internal Medicine

## 2013-09-26 NOTE — Telephone Encounter (Signed)
Pt's daughter called stated that they are aware of the lab result and already pick up the antibiotic. Pt was wondering if Mrs. Cree still need to come in on 09/30/13 for follow up? Please advise

## 2013-09-26 NOTE — Telephone Encounter (Signed)
No. Pls RTC in 2 wks. Is she better? Thx

## 2013-09-30 ENCOUNTER — Ambulatory Visit: Payer: Medicare Other | Admitting: Internal Medicine

## 2013-09-30 MED ORDER — CEFPODOXIME PROXETIL 100 MG PO TABS: 100.0000 mg | ORAL_TABLET | Freq: Two times a day (BID) | ORAL | Status: DC

## 2013-09-30 NOTE — Telephone Encounter (Signed)
OK D/c Macrobid Start Vantin 1 po bid - emailed  Thx

## 2013-09-30 NOTE — Telephone Encounter (Signed)
P/t's daughter informed

## 2013-09-30 NOTE — Telephone Encounter (Signed)
Pt's daughter called stated that Mrs. Godleski is not doing too good with this med. Pt took first pill around 10 am and pt is throwing up around 2-3 pm. Pt is throwing up everyday during the day but not at night. Please advise, pt request your advise before 10 am today (before first med given). Pt has an appt 2.10.15.

## 2013-10-15 ENCOUNTER — Ambulatory Visit (INDEPENDENT_AMBULATORY_CARE_PROVIDER_SITE_OTHER): Payer: Medicare Other | Admitting: Internal Medicine

## 2013-10-15 ENCOUNTER — Other Ambulatory Visit (INDEPENDENT_AMBULATORY_CARE_PROVIDER_SITE_OTHER): Payer: Medicare Other

## 2013-10-15 ENCOUNTER — Encounter: Payer: Self-pay | Admitting: Internal Medicine

## 2013-10-15 ENCOUNTER — Ambulatory Visit: Payer: Medicare Other | Admitting: Internal Medicine

## 2013-10-15 VITALS — BP 170/66 | HR 80 | Temp 98.6°F | Resp 16 | Wt 140.0 lb

## 2013-10-15 DIAGNOSIS — N390 Urinary tract infection, site not specified: Secondary | ICD-10-CM

## 2013-10-15 LAB — URINALYSIS
BILIRUBIN URINE: NEGATIVE
Hgb urine dipstick: NEGATIVE
Ketones, ur: NEGATIVE
Leukocytes, UA: NEGATIVE
Nitrite: NEGATIVE
SPECIFIC GRAVITY, URINE: 1.02 (ref 1.000–1.030)
Total Protein, Urine: NEGATIVE
URINE GLUCOSE: NEGATIVE
Urobilinogen, UA: 0.2 (ref 0.0–1.0)
pH: 5.5 (ref 5.0–8.0)

## 2013-10-15 MED ORDER — IPRATROPIUM BROMIDE 0.06 % NA SOLN: 2.0000 | Freq: Three times a day (TID) | NASAL | Status: DC | PRN

## 2013-10-15 NOTE — Assessment & Plan Note (Signed)
Recurrent  Resistant E coli (pt was ale to tolerate Vantin ok)   UA and Cx

## 2013-10-15 NOTE — Progress Notes (Signed)
Pre visit review using our clinic review tool, if applicable. No additional management support is needed unless otherwise documented below in the visit note. 

## 2013-10-15 NOTE — Patient Instructions (Signed)
   Milk free trial (no milk, ice cream, cheese and yogurt) for 4-6 weeks. OK to use almond, coconut, rice or soy milk. "Almond breeze" brand tastes good.  

## 2013-10-16 LAB — CULTURE, URINE COMPREHENSIVE
Colony Count: NO GROWTH
ORGANISM ID, BACTERIA: NO GROWTH

## 2013-10-18 ENCOUNTER — Telehealth: Payer: Self-pay | Admitting: Internal Medicine

## 2013-10-18 NOTE — Telephone Encounter (Signed)
Rec'd 1 actual page of office note from [place] for Dr. Alain Marion 10/16/13. Sent up to Dr. Alain Marion 10/18/13

## 2013-10-22 ENCOUNTER — Encounter: Payer: Self-pay | Admitting: Internal Medicine

## 2013-10-22 NOTE — Progress Notes (Signed)
   Subjective:    HPI   F/u dizziness when getting up- resolved off BP Rx F/u HA R>L occipital - resolved   F/u UTI - sx's resolved  The patient is here to follow up on chronic depression, anxiety, headaches and chronic moderate fibromyalgia symptoms controlled with medicines  F/u CVA, GERD, fatigue, gammopathy  Wt Readings from Last 3 Encounters:  10/15/13 140 lb (63.504 kg)  09/23/13 139 lb (63.05 kg)  09/03/13 139 lb (63.05 kg)   BP Readings from Last 3 Encounters:  10/15/13 170/66  09/23/13 170/75  09/03/13 130/58      Review of Systems  Constitutional: Positive for fatigue. Negative for chills, activity change, appetite change and unexpected weight change.  HENT: Negative for congestion, mouth sores and sinus pressure.   Eyes: Negative for visual disturbance.  Respiratory: Negative for cough and chest tightness.   Gastrointestinal: Negative for nausea and abdominal pain.  Genitourinary: Negative for frequency, difficulty urinating and vaginal pain.  Musculoskeletal: Negative for back pain and gait problem.  Skin: Negative for pallor and rash.  Neurological: Negative for dizziness, tremors, weakness, numbness and headaches.  Psychiatric/Behavioral: Negative for confusion and sleep disturbance. The patient is nervous/anxious.        Objective:   Physical Exam  Constitutional: She appears well-developed and well-nourished. No distress.  HENT:  Head: Normocephalic.  Right Ear: External ear normal.  Left Ear: External ear normal.  Nose: Nose normal.  Mouth/Throat: Oropharynx is clear and moist.  Eyes: Conjunctivae are normal. Pupils are equal, round, and reactive to light. Right eye exhibits no discharge. Left eye exhibits no discharge.  Neck: Normal range of motion. Neck supple. No JVD present. No tracheal deviation present. No thyromegaly present.  Cardiovascular: Normal rate, regular rhythm and normal heart sounds.   Pulmonary/Chest: No stridor. No  respiratory distress. She has no wheezes.  Abdominal: Soft. Bowel sounds are normal. She exhibits no distension and no mass. There is no tenderness. There is no rebound and no guarding.  Musculoskeletal: She exhibits no edema and no tenderness.  Lymphadenopathy:    She has no cervical adenopathy.  Neurological: She displays normal reflexes. No cranial nerve deficit. She exhibits normal muscle tone. Coordination normal.  Skin: No rash noted. No erythema.  Psychiatric: She has a normal mood and affect. Her behavior is normal. Judgment and thought content normal.    Lab Results  Component Value Date   WBC 7.1 09/03/2013   HGB 9.9* 09/03/2013   HCT 28.6* 09/03/2013   PLT 223.0 09/03/2013   GLUCOSE 102* 09/03/2013   CHOL 110 01/08/2013   TRIG 102 01/08/2013   HDL 13* 01/08/2013   LDLDIRECT 172.2 12/21/2012   LDLCALC 77 01/08/2013   ALT 19 07/01/2013   AST 18 07/01/2013   NA 138 09/03/2013   K 4.7 09/03/2013   CL 106 09/03/2013   CREATININE 1.8* 09/03/2013   BUN 29* 09/03/2013   CO2 23 09/03/2013   TSH 1.44 09/03/2013   INR 1.08 03/19/2013   HGBA1C 5.7* 01/08/2013         Assessment & Plan:

## 2013-10-25 ENCOUNTER — Telehealth: Payer: Self-pay | Admitting: *Deleted

## 2013-10-25 NOTE — Telephone Encounter (Signed)
Pt's daughter informed of Urine culture results.

## 2013-10-25 NOTE — Telephone Encounter (Signed)
Patient's daughter, [name] [name], phoned for test results from 10/15/13.  Please advise.  CB# [phone]

## 2014-01-05 NOTE — Progress Notes (Signed)
HPI [name] [name] is a 80 yo with a history of CVA, pancreatitis, hypertrophic cardiomyopathy.  coronary calcifications.   iI saw the patient in September.  Myoview scan showed no signif ischemia  LVEF normal.   Since then she denies CP  Breathing is OK       Allergies  Allergen Reactions  . Cefuroxime Axetil     REACTION: nausea She was able to tolerate Vantin fine  . Labetalol     Ortho BP drop  . Nitrofurantoin     REACTION: nausea  . Sulfadiazine Rash    Current Outpatient Prescriptions  Medication Sig Dispense Refill  . amLODipine (NORVASC) 2.5 MG tablet Take 1/2 tablet daily  30 tablet  6  . aspirin 325 MG tablet Take 1 tablet (325 mg total) by mouth daily.      . cholecalciferol (VITAMIN D) 1000 UNITS tablet Take 1,000 Units by mouth daily.        . cyanocobalamin (,VITAMIN B-12,) 1000 MCG/ML injection Inject 1 mL (1,000 mcg total) into the skin every 14 (fourteen) days.  30 mL  1  . Docusate Calcium (STOOL SOFTENER PO) Take 200 mg by mouth at bedtime as needed (constipation).      . folic acid (FOLVITE) A999333 MCG tablet Take 400 mcg by mouth daily.      Marland Kitchen ibuprofen (ADVIL,MOTRIN) 200 MG tablet Take 200-400 mg by mouth every 6 (six) hours as needed for headache or moderate pain.      Marland Kitchen ipratropium (ATROVENT) 0.06 % nasal spray Place 2 sprays into the nose 3 (three) times daily as needed for rhinitis. Runny nose  15 mL  2  . levothyroxine (SYNTHROID, LEVOTHROID) 88 MCG tablet Take 88 mcg by mouth daily before breakfast.      . omeprazole (PRILOSEC) 40 MG capsule Take 1 capsule (40 mg total) by mouth daily.  90 capsule  3  . triamcinolone ointment (KENALOG) 0.5 %       . vitamin B-12 (CYANOCOBALAMIN) 1000 MCG tablet Take 1,000 mcg by mouth every evening.      . zaleplon (SONATA) 10 MG capsule Take 10 mg by mouth at bedtime as needed for sleep.        No current facility-administered medications for this visit.    Past Medical History  Diagnosis Date  . Hypertension   .  Hyperlipidemia   . Palpitations   . Thyroid disease   . Weakness generalized   . Anemia   . Other urinary problems   . Lightheadedness   . Breast pain   . Vasovagal syncope   . Cellulitis and abscess of unspecified digit   . Urinary tract infection, site not specified   . Urgency of urination   . Other symptoms involving abdomen and pelvis(789.9)   . Rash and other nonspecific skin eruption   . Unspecified venous (peripheral) insufficiency   . Edema   . Osteoarthrosis, unspecified whether generalized or localized, unspecified site   . Monoclonal paraproteinemia   . Irritable bowel syndrome   . Other B-complex deficiencies   . Unspecified hypothyroidism   . GERD (gastroesophageal reflux disease)   . Anxiety   . Torus palatinus   . Pancreatitis   . Complication of anesthesia   . PONV (postoperative nausea and vomiting)   . Dyspnea     w/ atypical upper airway symptoms? all vcd/lpr/gerd  . Pneumonia 01/2013  . Renal insufficiency     after dye test, have improved.  . Constipation   .  Stroke 01/2013    right arm and hand weakness, memory loss  . Biliary acute pancreatitis 07/09/2013    Lap chole on 07/18/13     Past Surgical History  Procedure Laterality Date  . Back surgery    . Shoulder surgery    . Abdominal hysterectomy    . Vaginosacropexy    . Suprapubic bladder suspension    . Tee without cardioversion N/A 01/16/2013    Procedure: TRANSESOPHAGEAL ECHOCARDIOGRAM (TEE);  Surgeon: Josue Hector, MD;  Location: [facility];  Service: Cardiovascular;  Laterality: N/A;  . Appendectomy    . Thyroiodectomy    . Eye surgery Bilateral 2014    cataract  . Cholecystectomy  07/18/2013    Dr Georgette Dover  . Cholecystectomy N/A 07/18/2013    Procedure: LAPAROSCOPIC CHOLECYSTECTOMY;  Surgeon: Imogene Burn. Georgette Dover, MD;  Location: [facility];  Service: General;  Laterality: N/A;    Family History  Problem Relation Age of Onset  . Hypertension Other   . Hypertension Father   . Heart  Problems Father     History   Social History  . Marital Status: Married    Spouse Name: N/A    Number of Children: N/A  . Years of Education: N/A   Occupational History  . Not on file.   Social History Main Topics  . Smoking status: Never Smoker   . Smokeless tobacco: Never Used  . Alcohol Use: Yes     Comment: Occasionally during the night to help sleep  . Drug Use: No  . Sexual Activity: Not on file   Other Topics Concern  . Not on file   Social History Narrative   Retired   Married   Never Smoked   Alcohol use- no    Review of Systems:  All systems reviewed.  They are negative to the above problem except as previously stated.  Vital Signs: BP 178/80  Pulse 70  Ht 5\' 3"  (1.6 m)  Wt 136 lb 6.4 oz (61.871 kg)  BMI 24.17 kg/m2  SpO2 98%  Physical Exam Patient is in NAD HEENT:  Normocephalic, atraumatic. EOMI, PERRLA.  Neck: JVP is normal.  No bruits.  Lungs: clear to auscultation. No rales no wheezes.  Heart: Regular rate and rhythm. Normal S1, S2. No S3.   No significant murmurs. PMI not displaced.  Abdomen:  Supple, nontender. Normal bowel sounds. No masses. No hepatomegaly.  Extremities:   Good distal pulses throughout. No lower extremity edema.  Musculoskeletal :moving all extremities.  Neuro:   alert and oriented x3.  CN II-XII grossly intact.  EKG  SR 66 bpm.   Assessment and Plan:  1.  CP Denies  2.  CAD  No CP  Get labs from Dr Cox  3.  HTN  BP is elevated.  Would increase amlodipine.    4.  CVA  No documented arrhythmia.

## 2014-01-06 ENCOUNTER — Encounter: Payer: Self-pay | Admitting: Internal Medicine

## 2014-01-06 ENCOUNTER — Ambulatory Visit (INDEPENDENT_AMBULATORY_CARE_PROVIDER_SITE_OTHER): Payer: Medicare Other | Admitting: Internal Medicine

## 2014-01-06 VITALS — BP 178/80 | HR 70 | Ht 63.0 in | Wt 136.4 lb

## 2014-01-06 DIAGNOSIS — I6350 Cerebral infarction due to unspecified occlusion or stenosis of unspecified cerebral artery: Secondary | ICD-10-CM

## 2014-01-06 DIAGNOSIS — I639 Cerebral infarction, unspecified: Secondary | ICD-10-CM

## 2014-01-06 DIAGNOSIS — I10 Essential (primary) hypertension: Secondary | ICD-10-CM

## 2014-01-06 DIAGNOSIS — I2510 Atherosclerotic heart disease of native coronary artery without angina pectoris: Secondary | ICD-10-CM

## 2014-01-06 MED ORDER — AMLODIPINE BESYLATE 2.5 MG PO TABS: 2.5000 mg | ORAL_TABLET | Freq: Two times a day (BID) | ORAL | Status: DC

## 2014-01-06 NOTE — Patient Instructions (Signed)
Your physician recommends that you schedule a follow-up appointment in: AS NEEDED  INCREASE AMLODIPINE TO 2.5 MG ONCE DAILY X 3 DAYS THEN INCREASE TO 2.5 MG TWICE DAILY

## 2014-01-07 ENCOUNTER — Encounter: Payer: Self-pay | Admitting: Internal Medicine

## 2014-01-07 NOTE — Telephone Encounter (Signed)
This encounter was created in error - please disregard.

## 2014-01-07 NOTE — Telephone Encounter (Signed)
New message     Saw Dr Harrington Challenger yesterday----questions about medications

## 2014-01-29 ENCOUNTER — Encounter (HOSPITAL_COMMUNITY): Payer: Self-pay | Admitting: Emergency Medicine

## 2014-01-29 ENCOUNTER — Inpatient Hospital Stay (HOSPITAL_COMMUNITY)
Admission: EM | Admit: 2014-01-29 | Discharge: 2014-02-02 | DRG: 440 | Disposition: A | Discharge status: Home / Self Care | Payer: Medicare Other | Attending: Internal Medicine | Admitting: Internal Medicine

## 2014-01-29 ENCOUNTER — Emergency Department (HOSPITAL_COMMUNITY): Payer: Medicare Other

## 2014-01-29 DIAGNOSIS — D3701 Neoplasm of uncertain behavior of lip: Secondary | ICD-10-CM

## 2014-01-29 DIAGNOSIS — R1013 Epigastric pain: Secondary | ICD-10-CM

## 2014-01-29 DIAGNOSIS — M1990 Unspecified osteoarthritis, unspecified site: Secondary | ICD-10-CM

## 2014-01-29 DIAGNOSIS — R0602 Shortness of breath: Secondary | ICD-10-CM

## 2014-01-29 DIAGNOSIS — D649 Anemia, unspecified: Secondary | ICD-10-CM

## 2014-01-29 DIAGNOSIS — K219 Gastro-esophageal reflux disease without esophagitis: Secondary | ICD-10-CM

## 2014-01-29 DIAGNOSIS — N390 Urinary tract infection, site not specified: Secondary | ICD-10-CM

## 2014-01-29 DIAGNOSIS — K851 Biliary acute pancreatitis without necrosis or infection: Secondary | ICD-10-CM

## 2014-01-29 DIAGNOSIS — R198 Other specified symptoms and signs involving the digestive system and abdomen: Secondary | ICD-10-CM

## 2014-01-29 DIAGNOSIS — Z8719 Personal history of other diseases of the digestive system: Secondary | ICD-10-CM

## 2014-01-29 DIAGNOSIS — D3709 Neoplasm of uncertain behavior of other specified sites of the oral cavity: Secondary | ICD-10-CM

## 2014-01-29 DIAGNOSIS — M949 Disorder of cartilage, unspecified: Secondary | ICD-10-CM

## 2014-01-29 DIAGNOSIS — Z8673 Personal history of transient ischemic attack (TIA), and cerebral infarction without residual deficits: Secondary | ICD-10-CM

## 2014-01-29 DIAGNOSIS — R3915 Urgency of urination: Secondary | ICD-10-CM

## 2014-01-29 DIAGNOSIS — F341 Dysthymic disorder: Secondary | ICD-10-CM

## 2014-01-29 DIAGNOSIS — E538 Deficiency of other specified B group vitamins: Secondary | ICD-10-CM

## 2014-01-29 DIAGNOSIS — D472 Monoclonal gammopathy: Secondary | ICD-10-CM

## 2014-01-29 DIAGNOSIS — I872 Venous insufficiency (chronic) (peripheral): Secondary | ICD-10-CM

## 2014-01-29 DIAGNOSIS — E785 Hyperlipidemia, unspecified: Secondary | ICD-10-CM

## 2014-01-29 DIAGNOSIS — R933 Abnormal findings on diagnostic imaging of other parts of digestive tract: Secondary | ICD-10-CM

## 2014-01-29 DIAGNOSIS — I951 Orthostatic hypotension: Secondary | ICD-10-CM

## 2014-01-29 DIAGNOSIS — IMO0002 Reserved for concepts with insufficient information to code with codable children: Secondary | ICD-10-CM

## 2014-01-29 DIAGNOSIS — R1310 Dysphagia, unspecified: Secondary | ICD-10-CM

## 2014-01-29 DIAGNOSIS — R21 Rash and other nonspecific skin eruption: Secondary | ICD-10-CM

## 2014-01-29 DIAGNOSIS — R109 Unspecified abdominal pain: Secondary | ICD-10-CM

## 2014-01-29 DIAGNOSIS — R079 Chest pain, unspecified: Secondary | ICD-10-CM | POA: Diagnosis present

## 2014-01-29 DIAGNOSIS — I639 Cerebral infarction, unspecified: Secondary | ICD-10-CM

## 2014-01-29 DIAGNOSIS — R002 Palpitations: Secondary | ICD-10-CM

## 2014-01-29 DIAGNOSIS — F411 Generalized anxiety disorder: Secondary | ICD-10-CM

## 2014-01-29 DIAGNOSIS — K861 Other chronic pancreatitis: Secondary | ICD-10-CM

## 2014-01-29 DIAGNOSIS — J189 Pneumonia, unspecified organism: Secondary | ICD-10-CM

## 2014-01-29 DIAGNOSIS — R55 Syncope and collapse: Secondary | ICD-10-CM

## 2014-01-29 DIAGNOSIS — N179 Acute kidney failure, unspecified: Secondary | ICD-10-CM

## 2014-01-29 DIAGNOSIS — R030 Elevated blood-pressure reading, without diagnosis of hypertension: Secondary | ICD-10-CM

## 2014-01-29 DIAGNOSIS — M79604 Pain in right leg: Secondary | ICD-10-CM

## 2014-01-29 DIAGNOSIS — I10 Essential (primary) hypertension: Secondary | ICD-10-CM | POA: Diagnosis present

## 2014-01-29 DIAGNOSIS — Z79899 Other long term (current) drug therapy: Secondary | ICD-10-CM

## 2014-01-29 DIAGNOSIS — D3705 Neoplasm of uncertain behavior of pharynx: Secondary | ICD-10-CM

## 2014-01-29 DIAGNOSIS — R509 Fever, unspecified: Secondary | ICD-10-CM

## 2014-01-29 DIAGNOSIS — K859 Acute pancreatitis without necrosis or infection, unspecified: Principal | ICD-10-CM | POA: Diagnosis present

## 2014-01-29 DIAGNOSIS — R609 Edema, unspecified: Secondary | ICD-10-CM

## 2014-01-29 DIAGNOSIS — M899 Disorder of bone, unspecified: Secondary | ICD-10-CM

## 2014-01-29 DIAGNOSIS — K8020 Calculus of gallbladder without cholecystitis without obstruction: Secondary | ICD-10-CM

## 2014-01-29 DIAGNOSIS — N644 Mastodynia: Secondary | ICD-10-CM

## 2014-01-29 DIAGNOSIS — J9600 Acute respiratory failure, unspecified whether with hypoxia or hypercapnia: Secondary | ICD-10-CM

## 2014-01-29 DIAGNOSIS — E039 Hypothyroidism, unspecified: Secondary | ICD-10-CM | POA: Diagnosis present

## 2014-01-29 DIAGNOSIS — Z7982 Long term (current) use of aspirin: Secondary | ICD-10-CM

## 2014-01-29 DIAGNOSIS — I422 Other hypertrophic cardiomyopathy: Secondary | ICD-10-CM

## 2014-01-29 DIAGNOSIS — R0789 Other chest pain: Secondary | ICD-10-CM | POA: Diagnosis present

## 2014-01-29 DIAGNOSIS — G9340 Encephalopathy, unspecified: Secondary | ICD-10-CM

## 2014-01-29 DIAGNOSIS — K589 Irritable bowel syndrome without diarrhea: Secondary | ICD-10-CM

## 2014-01-29 LAB — COMPREHENSIVE METABOLIC PANEL
ALK PHOS: 85 U/L (ref 39–117)
ALT: 13 U/L (ref 0–35)
AST: 18 U/L (ref 0–37)
Albumin: 3.1 g/dL — ABNORMAL LOW (ref 3.5–5.2)
BUN: 29 mg/dL — ABNORMAL HIGH (ref 6–23)
CALCIUM: 9.4 mg/dL (ref 8.4–10.5)
CO2: 23 meq/L (ref 19–32)
Chloride: 100 mEq/L (ref 96–112)
Creatinine, Ser: 1.16 mg/dL — ABNORMAL HIGH (ref 0.50–1.10)
GFR calc Af Amer: 51 mL/min — ABNORMAL LOW (ref >90)
GFR, EST NON AFRICAN AMERICAN: 44 mL/min — AB (ref >90)
GLUCOSE: 111 mg/dL — AB (ref 70–99)
POTASSIUM: 3.8 meq/L (ref 3.7–5.3)
SODIUM: 138 meq/L (ref 137–147)
TOTAL PROTEIN: 8.3 g/dL (ref 6.0–8.3)
Total Bilirubin: 0.6 mg/dL (ref 0.3–1.2)

## 2014-01-29 LAB — URINALYSIS, ROUTINE W REFLEX MICROSCOPIC
BILIRUBIN URINE: NEGATIVE
Glucose, UA: NEGATIVE mg/dL
HGB URINE DIPSTICK: NEGATIVE
Ketones, ur: NEGATIVE mg/dL
Leukocytes, UA: NEGATIVE
NITRITE: NEGATIVE
PROTEIN: NEGATIVE mg/dL
Specific Gravity, Urine: 1.011 (ref 1.005–1.030)
UROBILINOGEN UA: 0.2 mg/dL (ref 0.0–1.0)
pH: 6 (ref 5.0–8.0)

## 2014-01-29 LAB — CBC
HCT: 34 % — ABNORMAL LOW (ref 36.0–46.0)
Hemoglobin: 11.6 g/dL — ABNORMAL LOW (ref 12.0–15.0)
MCH: 30.6 pg (ref 26.0–34.0)
MCHC: 34.1 g/dL (ref 30.0–36.0)
MCV: 89.7 fL (ref 78.0–100.0)
PLATELETS: 248 10*3/uL (ref 150–400)
RBC: 3.79 MIL/uL — ABNORMAL LOW (ref 3.87–5.11)
RDW: 13.7 % (ref 11.5–15.5)
WBC: 7.5 10*3/uL (ref 4.0–10.5)

## 2014-01-29 LAB — TROPONIN I: Troponin I: <0.3 ng/mL (ref <0.30)

## 2014-01-29 LAB — LIPASE, BLOOD: LIPASE: 158 U/L — AB (ref 11–59)

## 2014-01-29 LAB — I-STAT TROPONIN, ED: Troponin i, poc: 0 ng/mL (ref 0.00–0.08)

## 2014-01-29 MED ORDER — ONDANSETRON HCL 4 MG PO TABS: 4.0000 mg | ORAL_TABLET | Freq: Four times a day (QID) | ORAL | Status: DC | PRN

## 2014-01-29 MED ORDER — SODIUM CHLORIDE 0.9 % IJ SOLN
3.0000 mL | Freq: Two times a day (BID) | INTRAMUSCULAR | Status: DC
  Administered 2014-01-29 – 2014-01-30 (×2): 3 mL via INTRAVENOUS

## 2014-01-29 MED ORDER — SENNA 8.6 MG PO TABS
2.0000 | ORAL_TABLET | Freq: Every day | ORAL | Status: DC
  Administered 2014-01-29: 17.2 mg via ORAL
  Filled 2014-01-29 (×2): qty 2

## 2014-01-29 MED ORDER — ASPIRIN 325 MG PO TABS
325.0000 mg | ORAL_TABLET | Freq: Every day | ORAL | Status: DC
  Filled 2014-01-29: qty 1

## 2014-01-29 MED ORDER — VITAMIN D3 25 MCG (1000 UNIT) PO TABS
1000.0000 [IU] | ORAL_TABLET | Freq: Every day | ORAL | Status: DC
  Filled 2014-01-29: qty 1

## 2014-01-29 MED ORDER — FOLIC ACID 0.5 MG HALF TAB
500.0000 ug | ORAL_TABLET | Freq: Every day | ORAL | Status: DC
  Filled 2014-01-29: qty 1

## 2014-01-29 MED ORDER — SODIUM CHLORIDE 0.9 % IV BOLUS (SEPSIS)
1000.0000 mL | Freq: Once | INTRAVENOUS | Status: AC
  Administered 2014-01-29: 1000 mL via INTRAVENOUS

## 2014-01-29 MED ORDER — ENOXAPARIN SODIUM 40 MG/0.4ML ~~LOC~~ SOLN
40.0000 mg | SUBCUTANEOUS | Status: DC
  Administered 2014-01-29 – 2014-02-01 (×4): 40 mg via SUBCUTANEOUS
  Filled 2014-01-29 (×5): qty 0.4

## 2014-01-29 MED ORDER — LEVOTHYROXINE SODIUM 88 MCG PO TABS
88.0000 ug | ORAL_TABLET | Freq: Every day | ORAL | Status: DC
  Administered 2014-01-30: 88 ug via ORAL
  Filled 2014-01-29 (×2): qty 1

## 2014-01-29 MED ORDER — PANTOPRAZOLE SODIUM 40 MG PO TBEC
80.0000 mg | DELAYED_RELEASE_TABLET | Freq: Every day | ORAL | Status: DC
  Administered 2014-01-29: 80 mg via ORAL
  Filled 2014-01-29: qty 2

## 2014-01-29 MED ORDER — VITAMIN B-12 1000 MCG PO TABS
1000.0000 ug | ORAL_TABLET | Freq: Every evening | ORAL | Status: DC
  Administered 2014-01-29: 1000 ug via ORAL
  Filled 2014-01-29 (×2): qty 1

## 2014-01-29 MED ORDER — ONDANSETRON HCL 4 MG/2ML IJ SOLN
4.0000 mg | Freq: Four times a day (QID) | INTRAMUSCULAR | Status: DC | PRN
  Administered 2014-01-30: 4 mg via INTRAVENOUS
  Filled 2014-01-29: qty 2

## 2014-01-29 MED ORDER — AMLODIPINE BESYLATE 2.5 MG PO TABS
2.5000 mg | ORAL_TABLET | Freq: Two times a day (BID) | ORAL | Status: DC
  Administered 2014-01-29: 2.5 mg via ORAL
  Filled 2014-01-29 (×3): qty 1

## 2014-01-29 MED ORDER — HYDROCODONE-ACETAMINOPHEN 5-325 MG PO TABS
1.0000 | ORAL_TABLET | ORAL | Status: DC | PRN
  Administered 2014-01-29 – 2014-02-01 (×7): 1 via ORAL
  Filled 2014-01-29 (×4): qty 1
  Filled 2014-01-29: qty 2
  Filled 2014-01-29 (×2): qty 1

## 2014-01-29 MED ORDER — SODIUM CHLORIDE 0.9 % IV SOLN: INTRAVENOUS | Status: DC

## 2014-01-29 MED ORDER — ACETAMINOPHEN 650 MG RE SUPP: 650.0000 mg | Freq: Four times a day (QID) | RECTAL | Status: DC | PRN

## 2014-01-29 MED ORDER — DOCUSATE SODIUM 100 MG PO CAPS
100.0000 mg | ORAL_CAPSULE | Freq: Two times a day (BID) | ORAL | Status: DC
  Administered 2014-01-29: 100 mg via ORAL
  Filled 2014-01-29 (×3): qty 1

## 2014-01-29 MED ORDER — ACETAMINOPHEN 325 MG PO TABS
650.0000 mg | ORAL_TABLET | Freq: Four times a day (QID) | ORAL | Status: DC | PRN
  Administered 2014-01-30: 650 mg via ORAL
  Filled 2014-01-29: qty 2

## 2014-01-29 MED ORDER — IOHEXOL 300 MG/ML  SOLN
80.0000 mL | Freq: Once | INTRAMUSCULAR | Status: AC | PRN
  Administered 2014-01-29: 80 mL via INTRAVENOUS

## 2014-01-29 NOTE — ED Notes (Signed)
Pt reports that she has been having chest pain for the past 2 days. States that she feels bloated and uncomfortable. Denies any n/v or SOB. Nothing seems to make the pain better or worse

## 2014-01-29 NOTE — ED Notes (Signed)
Patient transported to X-ray 

## 2014-01-29 NOTE — H&P (Signed)
History and Physical  [name] [name] [id number] DOB: 1932/11/04 DOA: 01/29/2014   PCP: Walker Kehr, MD   Chief Complaint: chest pain, epigastric pain  HPI:  80 year old female with a history of hypertension, hyperlipidemia, stroke, hypothyroidism, and chronic pancreatitis with pseudocyst presented with chest pain and epigastric pain that began on the evening prior to admission. The patient felt that it started after she ate some hot dogs. She describes the sensation as being dull and sharp occurring at rest without exacerbation during activity. She denies any dizziness, diaphoresis, nausea, vomiting, coughing, hemoptysis. There is no fevers or chills. The patient states she recently finished a course of doxycycline for a urinary tract infection. She denies any new medications, but she takes approximately 400-600 mg of ibuprofen 2 times per week for her shoulder pain. Presently, the patient states that her chest pain is significantly improved. Notably, the patient had a nuclear medicine stress test on 05/28/2013 which was low risk.  In the emergency department, her vital signs were stable. CMP and CBC were unremarkable. Lipase was 158. Urinalysis was negative for pyuria. Point-of-care troponin was negative. Serum creatinine was 1.16. Chest x-ray was negative. CT of the abdomen and pelvis showed a cystic lesion on anterior aspect of the pancreas with diffuse atrophy of the body and tail which was unchanged. Assessment/Plan: Atypical chest pain -Likely related to her chronic pancreatitis and possibly musculoskeletal -There was some reproducibility on palpation -Cycle troponins -EKG -Place on telemetry -Continue aspirin -05/28/2013 clindamycin stress test was low risk--no reversibility Chronic pancreatitis with elevated lipase -Clinical significance of mildly elevated lipase is unclear as the patient has no vomiting presently and describes her pain mostly as substernal -Advance diet  and monitor clinically -We'll give 1 L normal saline -Elevated lipase may be related to recent doxycycline as tetracyclines may cause pancreatitis Hypertension -Continue amlodipine Hypothyroidism -Continue Synthroid History of stroke  -Continue aspirin      Past Medical History  Diagnosis Date  . Hypertension   . Hyperlipidemia   . Palpitations   . Thyroid disease   . Weakness generalized   . Anemia   . Other urinary problems   . Lightheadedness   . Breast pain   . Vasovagal syncope   . Cellulitis and abscess of unspecified digit   . Urinary tract infection, site not specified   . Urgency of urination   . Other symptoms involving abdomen and pelvis(789.9)   . Rash and other nonspecific skin eruption   . Unspecified venous (peripheral) insufficiency   . Edema   . Osteoarthrosis, unspecified whether generalized or localized, unspecified site   . Monoclonal paraproteinemia   . Irritable bowel syndrome   . Other B-complex deficiencies   . Unspecified hypothyroidism   . GERD (gastroesophageal reflux disease)   . Anxiety   . Torus palatinus   . Pancreatitis   . Complication of anesthesia   . PONV (postoperative nausea and vomiting)   . Dyspnea     w/ atypical upper airway symptoms? all vcd/lpr/gerd  . Pneumonia 01/2013  . Renal insufficiency     after dye test, have improved.  . Constipation   . Stroke 01/2013    right arm and hand weakness, memory loss  . Biliary acute pancreatitis 07/09/2013    Lap chole on 07/18/13    Past Surgical History  Procedure Laterality Date  . Back surgery    . Shoulder surgery    . Abdominal hysterectomy    . Vaginosacropexy    .  Suprapubic bladder suspension    . Tee without cardioversion N/A 01/16/2013    Procedure: TRANSESOPHAGEAL ECHOCARDIOGRAM (TEE);  Surgeon: Josue Hector, MD;  Location: [facility];  Service: Cardiovascular;  Laterality: N/A;  . Appendectomy    . Thyroiodectomy    . Eye surgery Bilateral 2014    cataract   . Cholecystectomy  07/18/2013    Dr Georgette Dover  . Cholecystectomy N/A 07/18/2013    Procedure: LAPAROSCOPIC CHOLECYSTECTOMY;  Surgeon: Imogene Burn. Georgette Dover, MD;  Location: [facility];  Service: General;  Laterality: N/A;   Social History:  reports that she has never smoked. She has never used smokeless tobacco. She reports that she drinks alcohol. She reports that she does not use illicit drugs.   Family History  Problem Relation Age of Onset  . Hypertension Other   . Hypertension Father   . Heart Problems Father      Allergies  Allergen Reactions  . Cefuroxime Axetil     REACTION: nausea She was able to tolerate Vantin fine  . Labetalol     Ortho BP drop  . Nitrofurantoin     REACTION: nausea  . Sulfadiazine Rash      Prior to Admission medications   Medication Sig Start Date End Date Taking? Authorizing Provider  amLODipine (NORVASC) 2.5 MG tablet Take 1 tablet (2.5 mg total) by mouth 2 (two) times daily. 01/06/14  Yes Fay Records, MD  aspirin 325 MG tablet Take 1 tablet (325 mg total) by mouth daily. 01/23/13  Yes Sorin June Leap, MD  cholecalciferol (VITAMIN D) 1000 UNITS tablet Take 1,000 Units by mouth daily.     Yes Historical Provider, MD  folic acid (FOLVITE) A999333 MCG tablet Take 400 mcg by mouth daily.   Yes Historical Provider, MD  ibuprofen (ADVIL,MOTRIN) 200 MG tablet Take 200-400 mg by mouth every 6 (six) hours as needed for headache or moderate pain.   Yes Historical Provider, MD  levothyroxine (SYNTHROID, LEVOTHROID) 88 MCG tablet Take 88 mcg by mouth daily before breakfast.   Yes Historical Provider, MD  omeprazole (PRILOSEC) 40 MG capsule Take 40-80 mg by mouth daily as needed (reflux).   Yes Historical Provider, MD  Sennosides-Docusate Sodium (RA P-COL RITE PO) Take 2-4 capsules by mouth at bedtime as needed (constipation).   Yes Historical Provider, MD  triamcinolone ointment (KENALOG) 0.5 % Apply 1 application topically daily as needed.  04/17/13  Yes Historical Provider, MD   vitamin B-12 (CYANOCOBALAMIN) 1000 MCG tablet Take 1,000 mcg by mouth every evening.   Yes Historical Provider, MD  cyanocobalamin (,VITAMIN B-12,) 1000 MCG/ML injection Inject 1 mL (1,000 mcg total) into the skin every 14 (fourteen) days. 12/21/12   Aleksei Plotnikov V, MD    Review of Systems:  Constitutional:  No weight loss, night sweats, Fevers, chills, fatigue.  Head&Eyes: No headache.  No vision loss.  No eye pain or scotoma ENT:  No Difficulty swallowing,Tooth/dental problems,Sore throat,   Cardio-vascular:  No chest pain, Orthopnea, PND, swelling in lower extremities,  dizziness, palpitations  GI:  No nausea, vomiting, diarrhea, loss of appetite, hematochezia, melena, heartburn, indigestion, Resp:  No shortness of breath with exertion or at rest. No cough. No coughing up of blood .No wheezing.No chest wall deformity  Skin:  no rash or lesions.  GU:  no dysuria, change in color of urine, no urgency or frequency. No flank pain.  Musculoskeletal:  No swelling. No decreased range of motion. No back pain. Complains of right shoulder pain which  is chronic Psych:  No change in mood or affect. No depression or anxiety. Neurologic: No headache, no dysesthesia, no focal weakness, no vision loss. No syncope  Physical Exam: Filed Vitals:   01/29/14 0856 01/29/14 1015 01/29/14 1157 01/29/14 1200  BP: 156/61 191/78 170/62 164/54  Pulse: 78 76  81  Temp: 97.8 F (36.6 C)     TempSrc: Oral     Resp: 18 18 19 15   Height: 5\' 4"  (1.626 m)     Weight: 61.689 kg (136 lb)     SpO2: 100% 100% 98% 97%   General:  A&O x 3, NAD, nontoxic, pleasant/cooperative Head/Eye: No conjunctival hemorrhage, no icterus, Alicia/AT, No nystagmus ENT:  No icterus,  No thrush, good dentition, no pharyngeal exudate Neck:  No masses, no lymphadenpathy, no bruits CV:  RRR, no rub, no gallop, no S3 Lung:  CTAB, good air movement, no wheeze, no rhonchi Abdomen: soft/epigastric tender, +BS, nondistended, no  peritoneal signs Ext: No cyanosis, No rashes, No petechiae, No lymphangitis, No edema   Labs on Admission:  Basic Metabolic Panel:  Recent Labs Lab 01/29/14 0918  NA 138  K 3.8  CL 100  CO2 23  GLUCOSE 111*  BUN 29*  CREATININE 1.16*  CALCIUM 9.4   Liver Function Tests:  Recent Labs Lab 01/29/14 0918  AST 18  ALT 13  ALKPHOS 85  BILITOT 0.6  PROT 8.3  ALBUMIN 3.1*    Recent Labs Lab 01/29/14 0918  LIPASE 158*   No results found for this basename: AMMONIA,  in the last 168 hours CBC:  Recent Labs Lab 01/29/14 0918  WBC 7.5  HGB 11.6*  HCT 34.0*  MCV 89.7  PLT 248   Cardiac Enzymes: No results found for this basename: CKTOTAL, CKMB, CKMBINDEX, TROPONINI,  in the last 168 hours BNP: No components found with this basename: POCBNP,  CBG: No results found for this basename: GLUCAP,  in the last 168 hours  Radiological Exams on Admission: Dg Chest 2 View  01/29/2014   CLINICAL DATA:  Mid chest pain  EXAM: CHEST  2 VIEW  COMPARISON:  07/18/2013  FINDINGS: Heart, mediastinum hila are normal.  Lungs show areas of coarse reticular opacity consistent scarring. This is stable. No lung consolidation or edema. No pleural effusion or pneumothorax. Lungs are mildly hyperexpanded.  Bony thorax is demineralized but intact.  IMPRESSION: No acute cardiopulmonary disease.   Electronically Signed   By: Lajean Manes M.D.   On: 01/29/2014 09:47   Ct Abdomen Pelvis W Contrast  01/29/2014   CLINICAL DATA:  Left-sided abdominal pain  EXAM: CT ABDOMEN AND PELVIS WITH CONTRAST  TECHNIQUE: Multidetector CT imaging of the abdomen and pelvis was performed using the standard protocol following bolus administration of intravenous contrast.  CONTRAST:  75mL OMNIPAQUE IOHEXOL 300 MG/ML  SOLN  COMPARISON:  12/09/2013  FINDINGS: The lung bases are free of acute infiltrate or sizable effusion.  The liver, spleen, adrenal glands and kidneys are within normal limits. Small subcentimeter cysts are  again noted within the kidneys and stable from the prior exam. The gallbladder has been removed. A cystic-appearing lesion is noted along the anterior aspect of the pancreas with diffuse atrophy of the body and tail similar to that seen on the prior exam.  The appendix is been surgically removed. Diffuse diverticular change is noted changes of diverticulitis seen previously have resolved in the interval. No abscess is seen. The bladder is well distended. The uterus is been surgically removed.  Postsurgical changes are noted in the lumbar spine. Diffuse aortoiliac calcifications are seen. No acute bony abnormality is noted.  IMPRESSION: Diverticulosis without diverticulitis. The previously seen pericolonic inflammatory changes have resolved in the interval.  Stable pancreatic pseudocyst with atrophy of the pancreas similar to that seen on the prior exam.  Chronic changes as described above.  No acute abnormality is noted.   Electronically Signed   By: Inez Catalina M.D.   On: 01/29/2014 11:18    EKG: Independently reviewed. pending    Time spent:60 minutes Code Status:   FULL Family Communication:   Daughter and husband at bedside   Orson Eva, DO  [facility] Pager [phone]  If 7PM-7AM, please contact night-coverage [email or web address] Password [facility] 01/29/2014, 12:58 PM

## 2014-01-29 NOTE — ED Notes (Signed)
Patient transported to CT 

## 2014-01-29 NOTE — ED Notes (Signed)
Pt being transported to CT at this time; family in room

## 2014-01-29 NOTE — ED Notes (Signed)
Report given to [place], NT

## 2014-01-29 NOTE — ED Provider Notes (Signed)
CSN: [id number]     Arrival date & time 01/29/14  [id number] History   First MD Initiated Contact with Patient 01/29/14 [phone]     Chief Complaint  Patient presents with  . Chest Pain     (Consider location/radiation/quality/duration/timing/severity/associated sxs/prior Treatment) Patient is a 80 y.o. female presenting with chest pain. The history is provided by the patient.  Chest Pain Pain location:  Epigastric Pain quality: burning   Pain quality: not throbbing   Pain radiates to:  Mid back Pain radiates to the back: yes (occasionally)   Pain severity:  Moderate Onset quality:  Sudden Duration:  2 days Timing:  Intermittent Progression:  Waxing and waning Chronicity:  New Context: at rest   Relieved by:  Nothing Worsened by:  Nothing tried Ineffective treatments:  Antacids Associated symptoms: nausea   Associated symptoms: no abdominal pain, no cough, no fever, no shortness of breath and not vomiting     Past Medical History  Diagnosis Date  . Hypertension   . Hyperlipidemia   . Palpitations   . Thyroid disease   . Weakness generalized   . Anemia   . Other urinary problems   . Lightheadedness   . Breast pain   . Vasovagal syncope   . Cellulitis and abscess of unspecified digit   . Urinary tract infection, site not specified   . Urgency of urination   . Other symptoms involving abdomen and pelvis(789.9)   . Rash and other nonspecific skin eruption   . Unspecified venous (peripheral) insufficiency   . Edema   . Osteoarthrosis, unspecified whether generalized or localized, unspecified site   . Monoclonal paraproteinemia   . Irritable bowel syndrome   . Other B-complex deficiencies   . Unspecified hypothyroidism   . GERD (gastroesophageal reflux disease)   . Anxiety   . Torus palatinus   . Pancreatitis   . Complication of anesthesia   . PONV (postoperative nausea and vomiting)   . Dyspnea     w/ atypical upper airway symptoms? all vcd/lpr/gerd  . Pneumonia 01/2013   . Renal insufficiency     after dye test, have improved.  . Constipation   . Stroke 01/2013    right arm and hand weakness, memory loss  . Biliary acute pancreatitis 07/09/2013    Lap chole on 07/18/13    Past Surgical History  Procedure Laterality Date  . Back surgery    . Shoulder surgery    . Abdominal hysterectomy    . Vaginosacropexy    . Suprapubic bladder suspension    . Tee without cardioversion N/A 01/16/2013    Procedure: TRANSESOPHAGEAL ECHOCARDIOGRAM (TEE);  Surgeon: Josue Hector, MD;  Location: [name];  Service: Cardiovascular;  Laterality: N/A;  . Appendectomy    . Thyroiodectomy    . Eye surgery Bilateral 2014    cataract  . Cholecystectomy  07/18/2013    Dr Georgette Dover  . Cholecystectomy N/A 07/18/2013    Procedure: LAPAROSCOPIC CHOLECYSTECTOMY;  Surgeon: Imogene Burn. Georgette Dover, MD;  Location: [facility];  Service: General;  Laterality: N/A;   Family History  Problem Relation Age of Onset  . Hypertension Other   . Hypertension Father   . Heart Problems Father    History  Substance Use Topics  . Smoking status: Never Smoker   . Smokeless tobacco: Never Used  . Alcohol Use: Yes     Comment: Occasionally during the night to help sleep   OB History   Grav Para Term Preterm Abortions TAB SAB  Ect Mult Living                 Review of Systems  Constitutional: Negative for fever and chills.  Respiratory: Negative for cough and shortness of breath.   Cardiovascular: Negative for chest pain and leg swelling.  Gastrointestinal: Positive for nausea. Negative for vomiting and abdominal pain.  All other systems reviewed and are negative.     Allergies  Cefuroxime axetil; Labetalol; Nitrofurantoin; and Sulfadiazine  Home Medications   Prior to Admission medications   Medication Sig Start Date End Date Taking? Authorizing Provider  amLODipine (NORVASC) 2.5 MG tablet Take 1 tablet (2.5 mg total) by mouth 2 (two) times daily. 01/06/14  Yes Fay Records, MD  aspirin 325  MG tablet Take 1 tablet (325 mg total) by mouth daily. 01/23/13  Yes Sorin June Leap, MD  cholecalciferol (VITAMIN D) 1000 UNITS tablet Take 1,000 Units by mouth daily.     Yes Historical Provider, MD  folic acid (FOLVITE) A999333 MCG tablet Take 400 mcg by mouth daily.   Yes Historical Provider, MD  ibuprofen (ADVIL,MOTRIN) 200 MG tablet Take 200-400 mg by mouth every 6 (six) hours as needed for headache or moderate pain.   Yes Historical Provider, MD  levothyroxine (SYNTHROID, LEVOTHROID) 88 MCG tablet Take 88 mcg by mouth daily before breakfast.   Yes Historical Provider, MD  omeprazole (PRILOSEC) 40 MG capsule Take 1 capsule (40 mg total) by mouth daily. 04/01/13  Yes Aleksei Plotnikov V, MD  triamcinolone ointment (KENALOG) 0.5 % Apply 1 application topically daily as needed.  04/17/13  Yes Historical Provider, MD  vitamin B-12 (CYANOCOBALAMIN) 1000 MCG tablet Take 1,000 mcg by mouth every evening.   Yes Historical Provider, MD  cyanocobalamin (,VITAMIN B-12,) 1000 MCG/ML injection Inject 1 mL (1,000 mcg total) into the skin every 14 (fourteen) days. 12/21/12   Aleksei Plotnikov V, MD  Docusate Calcium (STOOL SOFTENER PO) Take 200 mg by mouth at bedtime as needed (constipation).    Historical Provider, MD   BP 156/61  Pulse 78  Temp(Src) 97.8 F (36.6 C) (Oral)  Resp 18  Ht 5\' 4"  (1.626 m)  Wt 136 lb (61.689 kg)  BMI 23.33 kg/m2  SpO2 100% Physical Exam  Nursing note and vitals reviewed. Constitutional: She is oriented to person, place, and time. She appears well-developed and well-nourished. No distress.  HENT:  Head: Normocephalic and atraumatic.  Eyes: EOM are normal. Pupils are equal, round, and reactive to light.  Neck: Normal range of motion. Neck supple.  Cardiovascular: Normal rate and regular rhythm.  Exam reveals no friction rub.   No murmur heard. Pulmonary/Chest: Effort normal and breath sounds normal. No respiratory distress. She has no wheezes. She has no rales.  Abdominal:  Soft. She exhibits no distension. There is tenderness (LLQ). There is no rebound.  Musculoskeletal: Normal range of motion. She exhibits no edema.  Neurological: She is alert and oriented to person, place, and time.  Skin: She is not diaphoretic.    ED Course  Procedures (including critical care time) Labs Review Labs Reviewed  CBC  COMPREHENSIVE METABOLIC PANEL  LIPASE, BLOOD  URINALYSIS, ROUTINE W REFLEX MICROSCOPIC  I-STAT [place], ED  Randolm Idol, ED    Imaging Review Dg Chest 2 View  01/29/2014   CLINICAL DATA:  Mid chest pain  EXAM: CHEST  2 VIEW  COMPARISON:  07/18/2013  FINDINGS: Heart, mediastinum hila are normal.  Lungs show areas of coarse reticular opacity consistent scarring. This is  stable. No lung consolidation or edema. No pleural effusion or pneumothorax. Lungs are mildly hyperexpanded.  Bony thorax is demineralized but intact.  IMPRESSION: No acute cardiopulmonary disease.   Electronically Signed   By: Lajean Manes M.D.   On: 01/29/2014 09:47   Ct Abdomen Pelvis W Contrast  01/29/2014   CLINICAL DATA:  Left-sided abdominal pain  EXAM: CT ABDOMEN AND PELVIS WITH CONTRAST  TECHNIQUE: Multidetector CT imaging of the abdomen and pelvis was performed using the standard protocol following bolus administration of intravenous contrast.  CONTRAST:  90mL OMNIPAQUE IOHEXOL 300 MG/ML  SOLN  COMPARISON:  12/09/2013  FINDINGS: The lung bases are free of acute infiltrate or sizable effusion.  The liver, spleen, adrenal glands and kidneys are within normal limits. Small subcentimeter cysts are again noted within the kidneys and stable from the prior exam. The gallbladder has been removed. A cystic-appearing lesion is noted along the anterior aspect of the pancreas with diffuse atrophy of the body and tail similar to that seen on the prior exam.  The appendix is been surgically removed. Diffuse diverticular change is noted changes of diverticulitis seen previously have resolved in the  interval. No abscess is seen. The bladder is well distended. The uterus is been surgically removed. Postsurgical changes are noted in the lumbar spine. Diffuse aortoiliac calcifications are seen. No acute bony abnormality is noted.  IMPRESSION: Diverticulosis without diverticulitis. The previously seen pericolonic inflammatory changes have resolved in the interval.  Stable pancreatic pseudocyst with atrophy of the pancreas similar to that seen on the prior exam.  Chronic changes as described above.  No acute abnormality is noted.   Electronically Signed   By: Inez Catalina M.D.   On: 01/29/2014 11:18     EKG Interpretation   Date/Time:  Wednesday Jan 29 2014 08:54:33 EDT Ventricular Rate:  82 PR Interval:  120 QRS Duration: 82 QT Interval:  394 QTC Calculation: 460 R Axis:   34 Text Interpretation:  Sinus rhythm with Premature atrial complexes Septal  infarct , age undetermined Similar to prior Confirmed by [facility]  MD, [place]  [phone]) on 01/29/2014 9:10:32 AM      MDM   Final diagnoses:  Chest pain  Epigastric abdominal pain  Other hypertrophic cardiomyopathy  Unspecified essential hypertension    12F presents with epigastric pain since last night. No pain right now. Began at rest, occasionally radiating to her back. No N/V/D. No abdominal pain. No fevers, coughing, SOB. Intermittent since last night. No alleviating or exacerbating factors. Prilosec given by daughter without relief. She states, "This doesn't feel like heart pain. It feels like indigestion." Patient has no prior MI or heart pain experience. Hx of HTN, HLD. Also hx of constipation with some very irregular bowel movements with associated nausea. The discomfort she's feeling is not similar to her nausea that she had with her constipation. Vitals stable. Pain free right now. No epigastric pain on exam, lungs clear. Does have some LLQ pain on exam. EKG normal. Will check abdominal CT to look for possible pancreatitis,  diverticulitis. CT normal. Mild elevation in lipase, but no evidence of pancreatitis on her CT.  With her atypical CP and no evidence of pancreatitis, concern for possible cardiac etiology of her symptoms. Admitted to medicine.    Osvaldo Shipper, MD 01/29/14 [phone]

## 2014-01-29 NOTE — ED Notes (Signed)
Pt not in room at this time, is out of the department for testing; family waiting in room

## 2014-01-29 NOTE — ED Notes (Signed)
Pt up ambulatory with no assistance to the bathroom at this time; pt also attempting to give an urine specimen; family at bedside

## 2014-01-30 ENCOUNTER — Inpatient Hospital Stay (HOSPITAL_COMMUNITY): Payer: Medicare Other

## 2014-01-30 DIAGNOSIS — K861 Other chronic pancreatitis: Secondary | ICD-10-CM

## 2014-01-30 DIAGNOSIS — K859 Acute pancreatitis without necrosis or infection, unspecified: Principal | ICD-10-CM

## 2014-01-30 LAB — COMPREHENSIVE METABOLIC PANEL
ALBUMIN: 2.6 g/dL — AB (ref 3.5–5.2)
ALT: 10 U/L (ref 0–35)
AST: 14 U/L (ref 0–37)
Alkaline Phosphatase: 72 U/L (ref 39–117)
BILIRUBIN TOTAL: 0.6 mg/dL (ref 0.3–1.2)
BUN: 22 mg/dL (ref 6–23)
CHLORIDE: 102 meq/L (ref 96–112)
CO2: 24 mEq/L (ref 19–32)
Calcium: 8.4 mg/dL (ref 8.4–10.5)
Creatinine, Ser: 1.09 mg/dL (ref 0.50–1.10)
GFR calc Af Amer: 54 mL/min — ABNORMAL LOW (ref >90)
GFR calc non Af Amer: 47 mL/min — ABNORMAL LOW (ref >90)
Glucose, Bld: 118 mg/dL — ABNORMAL HIGH (ref 70–99)
Potassium: 3.9 mEq/L (ref 3.7–5.3)
Sodium: 138 mEq/L (ref 137–147)
Total Protein: 7 g/dL (ref 6.0–8.3)

## 2014-01-30 LAB — LIPASE, BLOOD: Lipase: 1153 U/L — ABNORMAL HIGH (ref 11–59)

## 2014-01-30 LAB — TROPONIN I

## 2014-01-30 MED ORDER — FLEET ENEMA 7-19 GM/118ML RE ENEM
1.0000 | ENEMA | Freq: Once | RECTAL | Status: AC
  Administered 2014-01-30: 1 via RECTAL
  Filled 2014-01-30: qty 1

## 2014-01-30 MED ORDER — BISACODYL 10 MG RE SUPP
10.0000 mg | Freq: Once | RECTAL | Status: AC
  Administered 2014-01-30: 10 mg via RECTAL
  Filled 2014-01-30: qty 1

## 2014-01-30 MED ORDER — ASPIRIN 300 MG RE SUPP
300.0000 mg | Freq: Every day | RECTAL | Status: DC
  Administered 2014-01-30: 300 mg via RECTAL
  Filled 2014-01-30 (×2): qty 1

## 2014-01-30 MED ORDER — PANTOPRAZOLE SODIUM 40 MG IV SOLR
40.0000 mg | INTRAVENOUS | Status: DC
  Administered 2014-01-30 – 2014-01-31 (×2): 40 mg via INTRAVENOUS
  Filled 2014-01-30 (×3): qty 40

## 2014-01-30 MED ORDER — AMLODIPINE BESYLATE 5 MG PO TABS
5.0000 mg | ORAL_TABLET | Freq: Every day | ORAL | Status: DC
  Administered 2014-01-30 – 2014-02-02 (×4): 5 mg via ORAL
  Filled 2014-01-30 (×4): qty 1

## 2014-01-30 MED ORDER — LEVOTHYROXINE SODIUM 100 MCG IV SOLR
44.0000 ug | Freq: Every day | INTRAVENOUS | Status: DC
  Administered 2014-01-31: 44 ug via INTRAVENOUS
  Filled 2014-01-30 (×2): qty 5

## 2014-01-30 MED ORDER — MORPHINE SULFATE 2 MG/ML IJ SOLN
2.0000 mg | INTRAMUSCULAR | Status: DC | PRN
Start: 2014-01-30 — End: 2014-02-02

## 2014-01-30 MED ORDER — SODIUM CHLORIDE 0.9 % IV SOLN
INTRAVENOUS | Status: DC
  Administered 2014-01-30 – 2014-02-01 (×3): via INTRAVENOUS

## 2014-01-30 NOTE — Progress Notes (Signed)
PROGRESS NOTE  [name] [id number] DOB: 09-07-1932 DOA: 01/29/2014 PCP: Walker Kehr, MD  Assessment/Plan: Acute on chronic pancreatitis -Lipase increased 158---> 1153 -Patient recently finished doxycycline which may have caused exacerbation -N.p.o. except for essential medications -IV fluids -Bowel rest -Antiemetics -Abdominal ultrasound -Lipid panel -Discontinue any nonessential oral medications Atypical chest pain  -Likely related to her chronic pancreatitis and possibly musculoskeletal  -There was some reproducibility on palpation  -Cycle troponins--neg x 3  -EKG--sinus rhythm, no ST-T wave changes  -Place on telemetry  -Continue aspirin  -05/28/2013 nuc med stress test was low risk--no reversibility Hypertension  -Continue amlodipine  Hypothyroidism  -Continue Synthroid  History of stroke  -Continue aspirin   Family Communication:   Pt at beside Disposition Plan:   Home when medically stable       Procedures/Studies: Dg Chest 2 View  01/29/2014   CLINICAL DATA:  Mid chest pain  EXAM: CHEST  2 VIEW  COMPARISON:  07/18/2013  FINDINGS: Heart, mediastinum hila are normal.  Lungs show areas of coarse reticular opacity consistent scarring. This is stable. No lung consolidation or edema. No pleural effusion or pneumothorax. Lungs are mildly hyperexpanded.  Bony thorax is demineralized but intact.  IMPRESSION: No acute cardiopulmonary disease.   Electronically Signed   By: Lajean Manes M.D.   On: 01/29/2014 09:47   Ct Abdomen Pelvis W Contrast  01/29/2014   CLINICAL DATA:  Left-sided abdominal pain  EXAM: CT ABDOMEN AND PELVIS WITH CONTRAST  TECHNIQUE: Multidetector CT imaging of the abdomen and pelvis was performed using the standard protocol following bolus administration of intravenous contrast.  CONTRAST:  22mL OMNIPAQUE IOHEXOL 300 MG/ML  SOLN  COMPARISON:  12/09/2013  FINDINGS: The lung bases are free of acute infiltrate or sizable effusion.  The  liver, spleen, adrenal glands and kidneys are within normal limits. Small subcentimeter cysts are again noted within the kidneys and stable from the prior exam. The gallbladder has been removed. A cystic-appearing lesion is noted along the anterior aspect of the pancreas with diffuse atrophy of the body and tail similar to that seen on the prior exam.  The appendix is been surgically removed. Diffuse diverticular change is noted changes of diverticulitis seen previously have resolved in the interval. No abscess is seen. The bladder is well distended. The uterus is been surgically removed. Postsurgical changes are noted in the lumbar spine. Diffuse aortoiliac calcifications are seen. No acute bony abnormality is noted.  IMPRESSION: Diverticulosis without diverticulitis. The previously seen pericolonic inflammatory changes have resolved in the interval.  Stable pancreatic pseudocyst with atrophy of the pancreas similar to that seen on the prior exam.  Chronic changes as described above.  No acute abnormality is noted.   Electronically Signed   By: Inez Catalina M.D.   On: 01/29/2014 11:18         Subjective: Patient had an episode of emesis after breakfast this morning with increasing epigastric pain.  Denies fevers, chills, chest pain, shortness of breath, dysuria, hematuria, dizziness, headache.  Objective: Filed Vitals:   01/29/14 1542 01/29/14 1828 01/29/14 2000 01/30/14 0631  BP: 183/54 199/56 165/60 154/59  Pulse:   92 84  Temp:   99 F (37.2 C) 98.9 F (37.2 C)  TempSrc:   Oral Oral  Resp:   18 18  Height:      Weight:    59.875 kg (132 lb)  SpO2:   95% 96%  Intake/Output Summary (Last 24 hours) at 01/30/14 1000 Last data filed at 01/30/14 0900  Gross per 24 hour  Intake   1480 ml  Output    400 ml  Net   1080 ml   Weight change:  Exam:   General:  Pt is alert, follows commands appropriately, not in acute distress  HEENT: No icterus, No thrush,  [place]/AT  Cardiovascular:  RRR, S1/S2, no rubs, no gallops  Respiratory: CTA bilaterally, no wheezing, no crackles, no rhonchi  Abdomen: Soft/+BS, epigastric tenderness without any rebound, non distended, no guarding  Extremities: No edema, No lymphangitis, No petechiae, No rashes, no synovitis  Data Reviewed: Basic Metabolic Panel:  Recent Labs Lab 01/29/14 0918 01/30/14 0118  NA 138 138  K 3.8 3.9  CL 100 102  CO2 23 24  GLUCOSE 111* 118*  BUN 29* 22  CREATININE 1.16* 1.09  CALCIUM 9.4 8.4   Liver Function Tests:  Recent Labs Lab 01/29/14 0918 01/30/14 0118  AST 18 14  ALT 13 10  ALKPHOS 85 72  BILITOT 0.6 0.6  PROT 8.3 7.0  ALBUMIN 3.1* 2.6*    Recent Labs Lab 01/29/14 0918 01/30/14 0118  LIPASE 158* 1153*   No results found for this basename: AMMONIA,  in the last 168 hours CBC:  Recent Labs Lab 01/29/14 0918  WBC 7.5  HGB 11.6*  HCT 34.0*  MCV 89.7  PLT 248   Cardiac Enzymes:  Recent Labs Lab 01/29/14 1330 01/29/14 2019 01/30/14 0118  TROPONINI <0.30 <0.30 <0.30   BNP: No components found with this basename: POCBNP,  CBG: No results found for this basename: GLUCAP,  in the last 168 hours  No results found for this or any previous visit (from the past 240 hour(s)).   Scheduled Meds: . amLODipine  5 mg Oral Daily  . aspirin  300 mg Rectal Daily  . bisacodyl  10 mg Rectal Once  . enoxaparin (LOVENOX) injection  40 mg Subcutaneous Q24H  . [START ON 01/31/2014] levothyroxine  50 mcg Intravenous Daily  . pantoprazole (PROTONIX) IV  40 mg Intravenous Q24H  . sodium chloride  3 mL Intravenous Q12H   Continuous Infusions:    Orson Eva, DO  [facility] Pager [phone]  If 7PM-7AM, please contact night-coverage [email or web address] Password [email or web address] 01/30/2014, 10:00 AM   LOS: 1 day

## 2014-01-30 NOTE — Progress Notes (Signed)
UR completed 

## 2014-01-31 LAB — BASIC METABOLIC PANEL
BUN: 19 mg/dL (ref 6–23)
CALCIUM: 8.6 mg/dL (ref 8.4–10.5)
CO2: 23 meq/L (ref 19–32)
CREATININE: 1.08 mg/dL (ref 0.50–1.10)
Chloride: 104 mEq/L (ref 96–112)
GFR calc Af Amer: 55 mL/min — ABNORMAL LOW (ref >90)
GFR calc non Af Amer: 48 mL/min — ABNORMAL LOW (ref >90)
GLUCOSE: 80 mg/dL (ref 70–99)
Potassium: 4.1 mEq/L (ref 3.7–5.3)
Sodium: 141 mEq/L (ref 137–147)

## 2014-01-31 LAB — CBC
HCT: 27.5 % — ABNORMAL LOW (ref 36.0–46.0)
Hemoglobin: 9.4 g/dL — ABNORMAL LOW (ref 12.0–15.0)
MCH: 31 pg (ref 26.0–34.0)
MCHC: 34.2 g/dL (ref 30.0–36.0)
MCV: 90.8 fL (ref 78.0–100.0)
PLATELETS: 179 10*3/uL (ref 150–400)
RBC: 3.03 MIL/uL — ABNORMAL LOW (ref 3.87–5.11)
RDW: 13.8 % (ref 11.5–15.5)
WBC: 8.4 10*3/uL (ref 4.0–10.5)

## 2014-01-31 LAB — LIPID PANEL
Cholesterol: 117 mg/dL (ref 0–200)
HDL: 26 mg/dL — ABNORMAL LOW (ref >39)
LDL Cholesterol: 68 mg/dL (ref 0–99)
Total CHOL/HDL Ratio: 4.5 RATIO
Triglycerides: 114 mg/dL (ref <150)
VLDL: 23 mg/dL (ref 0–40)

## 2014-01-31 MED ORDER — ASPIRIN 325 MG PO TABS
325.0000 mg | ORAL_TABLET | Freq: Every day | ORAL | Status: DC
  Administered 2014-02-01 – 2014-02-02 (×2): 325 mg via ORAL
  Filled 2014-01-31 (×2): qty 1

## 2014-01-31 NOTE — Progress Notes (Signed)
PROGRESS NOTE  [name] [id number] DOB: [birth year] DOA: 01/29/2014 PCP: Walker Kehr, MD  Assessment/Plan:  Acute on chronic pancreatitis  -Lipase increased 158---> 1153  -Patient recently finished doxycycline which may have caused exacerbation  -IV fluids  -Bowel rest  -Antiemetics  -Abdominal ultrasound--negative for any ductal dilatation or acute pathology -Lipid panel--triglycerides 114 -Discontinue any nonessential oral medications  -01/31/2014-Start to liquid diet  Atypical chest pain  -Likely related to her chronic pancreatitis and possibly musculoskeletal  -There was some reproducibility on palpation  -Cycle troponins--neg x 3  -EKG--sinus rhythm, no ST-T wave changes  -Place on telemetry  -Continue aspirin  -05/28/2013 nuc med stress test was low risk--no reversibility  Hypertension  -Continue amlodipine  Hypothyroidism  -Continue Synthroid  History of stroke  -Continue aspirin  Family Communication: Pt at beside  Disposition Plan: Home when medically stable        Procedures/Studies: Dg Chest 2 View  01/29/2014   CLINICAL DATA:  Mid chest pain  EXAM: CHEST  2 VIEW  COMPARISON:  07/18/2013  FINDINGS: Heart, mediastinum hila are normal.  Lungs show areas of coarse reticular opacity consistent scarring. This is stable. No lung consolidation or edema. No pleural effusion or pneumothorax. Lungs are mildly hyperexpanded.  Bony thorax is demineralized but intact.  IMPRESSION: No acute cardiopulmonary disease.   Electronically Signed   By: Lajean Manes M.D.   On: 01/29/2014 09:47   US Abdomen Complete  01/30/2014   CLINICAL DATA:  Pancreatitis.  EXAM: ULTRASOUND ABDOMEN COMPLETE  COMPARISON:  CT 01/29/2014.  FINDINGS: Gallbladder:  Cholecystectomy.  Common bile duct:  Diameter: 2.5 mm  Liver:  No focal lesion identified. Within normal limits in parenchymal echogenicity.  IVC:  No abnormality visualized.  Pancreas:  Not visualized.  Spleen:  Size  and appearance within normal limits.  Right Kidney:  Length: 9.0 cm. 6 mm simple cyst upper pole right kidney . Echogenicity within normal limits. No hydronephrosis.  Left Kidney:  Length: 9.1 cm. Echogenicity within normal limits. No mass or hydronephrosis visualized.  Abdominal aorta:  Not well visualized.  No aneurysm identified.  Other findings:  None.  IMPRESSION: 1. Cholecystectomy.  No biliary distention. 2. No acute abnormality otherwise noted. The pancreatic region was not visualized. Reference is made to CT report 01/29/2014.   Electronically Signed   By: Marcello Moores  Register   On: 01/30/2014 18:53   Ct Abdomen Pelvis W Contrast  01/29/2014   CLINICAL DATA:  Left-sided abdominal pain  EXAM: CT ABDOMEN AND PELVIS WITH CONTRAST  TECHNIQUE: Multidetector CT imaging of the abdomen and pelvis was performed using the standard protocol following bolus administration of intravenous contrast.  CONTRAST:  1mL OMNIPAQUE IOHEXOL 300 MG/ML  SOLN  COMPARISON:  12/09/2013  FINDINGS: The lung bases are free of acute infiltrate or sizable effusion.  The liver, spleen, adrenal glands and kidneys are within normal limits. Small subcentimeter cysts are again noted within the kidneys and stable from the prior exam. The gallbladder has been removed. A cystic-appearing lesion is noted along the anterior aspect of the pancreas with diffuse atrophy of the body and tail similar to that seen on the prior exam.  The appendix is been surgically removed. Diffuse diverticular change is noted changes of diverticulitis seen previously have resolved in the interval. No abscess is seen. The bladder is well distended. The uterus is been surgically removed. Postsurgical changes are noted in the lumbar spine. Diffuse aortoiliac calcifications  are seen. No acute bony abnormality is noted.  IMPRESSION: Diverticulosis without diverticulitis. The previously seen pericolonic inflammatory changes have resolved in the interval.  Stable pancreatic  pseudocyst with atrophy of the pancreas similar to that seen on the prior exam.  Chronic changes as described above.  No acute abnormality is noted.   Electronically Signed   By: Inez Catalina M.D.   On: 01/29/2014 11:18         Subjective: Patient is feeling better today. She denies any fevers, chills, chest pain, shortness breath, nausea, vomiting, diarrhea, abdominal pain. She feels hungry and wants to eat.   Objective: Filed Vitals:   01/30/14 2225 01/31/14 0507 01/31/14 1055 01/31/14 1500  BP: 165/64 160/48 153/51 145/57  Pulse:  88  77  Temp:  98.3 F (36.8 C)  98 F (36.7 C)  TempSrc:  Oral  Oral  Resp:  16  16  Height:      Weight:  61.9 kg (136 lb 7.4 oz)    SpO2:  94%  95%   No intake or output data in the 24 hours ending 01/31/14 1854 Weight change: 0.211 kg (7.4 oz) Exam:   General:  Pt is alert, follows commands appropriately, not in acute distress  HEENT: No icterus, No thrush,[place]/AT  Cardiovascular: RRR, S1/S2, no rubs, no gallops  Respiratory: CTA bilaterally, no wheezing, no crackles, no rhonchi  Abdomen: Soft/+BS, mild epigastric tenderness without any peritoneal signs, non distended, no guarding  Extremities: No edema, No lymphangitis, No petechiae, No rashes, no synovitis  Data Reviewed: Basic Metabolic Panel:  Recent Labs Lab 01/29/14 0918 01/30/14 0118 01/31/14 0652  NA 138 138 141  K 3.8 3.9 4.1  CL 100 102 104  CO2 23 24 23   GLUCOSE 111* 118* 80  BUN 29* 22 19  CREATININE 1.16* 1.09 1.08  CALCIUM 9.4 8.4 8.6   Liver Function Tests:  Recent Labs Lab 01/29/14 0918 01/30/14 0118  AST 18 14  ALT 13 10  ALKPHOS 85 72  BILITOT 0.6 0.6  PROT 8.3 7.0  ALBUMIN 3.1* 2.6*    Recent Labs Lab 01/29/14 0918 01/30/14 0118  LIPASE 158* 1153*   No results found for this basename: AMMONIA,  in the last 168 hours CBC:  Recent Labs Lab 01/29/14 0918 01/31/14 0652  WBC 7.5 8.4  HGB 11.6* 9.4*  HCT 34.0* 27.5*  MCV 89.7 90.8    PLT 248 179   Cardiac Enzymes:  Recent Labs Lab 01/29/14 1330 01/29/14 2019 01/30/14 0118  TROPONINI <0.30 <0.30 <0.30   BNP: No components found with this basename: POCBNP,  CBG: No results found for this basename: GLUCAP,  in the last 168 hours  No results found for this or any previous visit (from the past 240 hour(s)).   Scheduled Meds: . amLODipine  5 mg Oral Daily  . aspirin  300 mg Rectal Daily  . enoxaparin (LOVENOX) injection  40 mg Subcutaneous Q24H  . levothyroxine  44 mcg Intravenous Daily  . pantoprazole (PROTONIX) IV  40 mg Intravenous Q24H  . sodium chloride  3 mL Intravenous Q12H   Continuous Infusions: . sodium chloride Stopped (01/30/14 1545)     Orson Eva, DO  [facility] Pager [phone]  If 7PM-7AM, please contact night-coverage [email or web address] Password [email or web address] 01/31/2014, 6:54 PM   LOS: 2 days

## 2014-02-01 LAB — BASIC METABOLIC PANEL
BUN: 16 mg/dL (ref 6–23)
CALCIUM: 8.3 mg/dL — AB (ref 8.4–10.5)
CO2: 24 mEq/L (ref 19–32)
CREATININE: 0.99 mg/dL (ref 0.50–1.10)
Chloride: 106 mEq/L (ref 96–112)
GFR, EST AFRICAN AMERICAN: 61 mL/min — AB (ref >90)
GFR, EST NON AFRICAN AMERICAN: 53 mL/min — AB (ref >90)
GLUCOSE: 89 mg/dL (ref 70–99)
Potassium: 4.1 mEq/L (ref 3.7–5.3)
Sodium: 138 mEq/L (ref 137–147)

## 2014-02-01 MED ORDER — POLYETHYLENE GLYCOL 3350 17 G PO PACK
17.0000 g | PACK | Freq: Every day | ORAL | Status: DC
  Administered 2014-02-01 – 2014-02-02 (×2): 17 g via ORAL
  Filled 2014-02-01 (×2): qty 1

## 2014-02-01 MED ORDER — LEVOTHYROXINE SODIUM 88 MCG PO TABS
88.0000 ug | ORAL_TABLET | Freq: Every day | ORAL | Status: DC
  Administered 2014-02-01 – 2014-02-02 (×2): 88 ug via ORAL
  Filled 2014-02-01 (×3): qty 1

## 2014-02-01 MED ORDER — PANTOPRAZOLE SODIUM 40 MG PO TBEC
40.0000 mg | DELAYED_RELEASE_TABLET | Freq: Every day | ORAL | Status: DC
  Administered 2014-02-01 – 2014-02-02 (×2): 40 mg via ORAL
  Filled 2014-02-01 (×2): qty 1

## 2014-02-01 NOTE — Progress Notes (Signed)
PROGRESS NOTE  [name] [name] [id number] DOB: 12/14/1932 DOA: 01/29/2014 PCP: Walker Kehr, MD  Assessment/Plan: Acute on chronic pancreatitis  -Lipase increased 158---> 1153 (5/28) -Patient was made n.p.o. and started on intravenous fluids -Patient recently finished doxycycline which may have caused exacerbation  -01/31/2014--started on clear liquids which the patient tolerated -02/01/2014--start full liquid diet -Antiemetics prn -Abdominal ultrasound--negative for any ductal dilatation or acute pathology  -Lipid panel--triglycerides 114  -Discontinue any nonessential oral medications  Atypical chest pain  -Likely related to her chronic pancreatitis and possibly musculoskeletal  -There was some reproducibility on palpation  -Cycle troponins--neg x 3  -EKG--sinus rhythm, no ST-T wave changes  -d/c tele -Continue aspirin  -05/28/2013 nuc med stress test was low risk--no reversibility  Hypertension  -Continue amlodipine  Hypothyroidism  -Continue Synthroid  History of stroke  -Continue aspirin  Family Communication: daughter Cecille Rubin updated at bedside Disposition Plan: Home when medically stable         Procedures/Studies: Dg Chest 2 View  01/29/2014   CLINICAL DATA:  Mid chest pain  EXAM: CHEST  2 VIEW  COMPARISON:  07/18/2013  FINDINGS: Heart, mediastinum hila are normal.  Lungs show areas of coarse reticular opacity consistent scarring. This is stable. No lung consolidation or edema. No pleural effusion or pneumothorax. Lungs are mildly hyperexpanded.  Bony thorax is demineralized but intact.  IMPRESSION: No acute cardiopulmonary disease.   Electronically Signed   By: Lajean Manes M.D.   On: 01/29/2014 09:47   US Abdomen Complete  01/30/2014   CLINICAL DATA:  Pancreatitis.  EXAM: ULTRASOUND ABDOMEN COMPLETE  COMPARISON:  CT 01/29/2014.  FINDINGS: Gallbladder:  Cholecystectomy.  Common bile duct:  Diameter: 2.5 mm  Liver:  No focal lesion identified. Within  normal limits in parenchymal echogenicity.  IVC:  No abnormality visualized.  Pancreas:  Not visualized.  Spleen:  Size and appearance within normal limits.  Right Kidney:  Length: 9.0 cm. 6 mm simple cyst upper pole right kidney . Echogenicity within normal limits. No hydronephrosis.  Left Kidney:  Length: 9.1 cm. Echogenicity within normal limits. No mass or hydronephrosis visualized.  Abdominal aorta:  Not well visualized.  No aneurysm identified.  Other findings:  None.  IMPRESSION: 1. Cholecystectomy.  No biliary distention. 2. No acute abnormality otherwise noted. The pancreatic region was not visualized. Reference is made to CT report 01/29/2014.   Electronically Signed   By: Marcello Moores  Register   On: 01/30/2014 18:53   Ct Abdomen Pelvis W Contrast  01/29/2014   CLINICAL DATA:  Left-sided abdominal pain  EXAM: CT ABDOMEN AND PELVIS WITH CONTRAST  TECHNIQUE: Multidetector CT imaging of the abdomen and pelvis was performed using the standard protocol following bolus administration of intravenous contrast.  CONTRAST:  49mL OMNIPAQUE IOHEXOL 300 MG/ML  SOLN  COMPARISON:  12/09/2013  FINDINGS: The lung bases are free of acute infiltrate or sizable effusion.  The liver, spleen, adrenal glands and kidneys are within normal limits. Small subcentimeter cysts are again noted within the kidneys and stable from the prior exam. The gallbladder has been removed. A cystic-appearing lesion is noted along the anterior aspect of the pancreas with diffuse atrophy of the body and tail similar to that seen on the prior exam.  The appendix is been surgically removed. Diffuse diverticular change is noted changes of diverticulitis seen previously have resolved in the interval. No abscess is seen. The bladder is well distended. The uterus is been surgically removed.  Postsurgical changes are noted in the lumbar spine. Diffuse aortoiliac calcifications are seen. No acute bony abnormality is noted.  IMPRESSION: Diverticulosis without  diverticulitis. The previously seen pericolonic inflammatory changes have resolved in the interval.  Stable pancreatic pseudocyst with atrophy of the pancreas similar to that seen on the prior exam.  Chronic changes as described above.  No acute abnormality is noted.   Electronically Signed   By: Inez Catalina M.D.   On: 01/29/2014 11:18         Subjective: Patient had a bowel movement with fleets enema. She denies any fevers, chills, chest pain, shortness breath, nausea, vomiting, diarrhea. She denies any abdominal pain this morning.  Objective: Filed Vitals:   01/31/14 1900 01/31/14 2000 01/31/14 2100 02/01/14 0500  BP: 115/45 130/50 175/58 136/40  Pulse: 63 72 81 66  Temp:   98.2 F (36.8 C) 98.2 F (36.8 C)  TempSrc:      Resp:   20 16  Height:      Weight:    60.782 kg (134 lb)  SpO2:  95% 98% 95%    Intake/Output Summary (Last 24 hours) at 02/01/14 0826 Last data filed at 02/01/14 0210  Gross per 24 hour  Intake    120 ml  Output    300 ml  Net   -180 ml   Weight change: -1.118 kg (-2 lb 7.4 oz) Exam:   General:  Pt is alert, follows commands appropriately, not in acute distress  HEENT: No icterus, No thrush,  [place]/AT  Cardiovascular: RRR, S1/S2, no rubs, no gallops  Respiratory: CTA bilaterally, no wheezing, no crackles, no rhonchi  Abdomen: Soft/+BS, mild epigastric tenderness without any guarding or peritoneal sign, non distended, no guarding  Extremities: trace LE edema, No lymphangitis, No petechiae, No rashes, no synovitis  Data Reviewed: Basic Metabolic Panel:  Recent Labs Lab 01/29/14 0918 01/30/14 0118 01/31/14 0652 02/01/14 0510  NA 138 138 141 138  K 3.8 3.9 4.1 4.1  CL 100 102 104 106  CO2 23 24 23 24   GLUCOSE 111* 118* 80 89  BUN 29* 22 19 16   CREATININE 1.16* 1.09 1.08 0.99  CALCIUM 9.4 8.4 8.6 8.3*   Liver Function Tests:  Recent Labs Lab 01/29/14 0918 01/30/14 0118  AST 18 14  ALT 13 10  ALKPHOS 85 72  BILITOT 0.6 0.6    PROT 8.3 7.0  ALBUMIN 3.1* 2.6*    Recent Labs Lab 01/29/14 0918 01/30/14 0118  LIPASE 158* 1153*   No results found for this basename: AMMONIA,  in the last 168 hours CBC:  Recent Labs Lab 01/29/14 0918 01/31/14 0652  WBC 7.5 8.4  HGB 11.6* 9.4*  HCT 34.0* 27.5*  MCV 89.7 90.8  PLT 248 179   Cardiac Enzymes:  Recent Labs Lab 01/29/14 1330 01/29/14 2019 01/30/14 0118  TROPONINI <0.30 <0.30 <0.30   BNP: No components found with this basename: POCBNP,  CBG: No results found for this basename: GLUCAP,  in the last 168 hours  No results found for this or any previous visit (from the past 240 hour(s)).   Scheduled Meds: . amLODipine  5 mg Oral Daily  . aspirin  325 mg Oral Daily  . enoxaparin (LOVENOX) injection  40 mg Subcutaneous Q24H  . levothyroxine  44 mcg Intravenous Daily  . pantoprazole (PROTONIX) IV  40 mg Intravenous Q24H  . sodium chloride  3 mL Intravenous Q12H   Continuous Infusions: . sodium chloride Stopped (01/30/14 1545)  Orson Eva, DO  [facility] Pager [phone]  If 7PM-7AM, please contact night-coverage [email or web address] Password [email or web address] 02/01/2014, 8:26 AM   LOS: 3 days

## 2014-02-02 DIAGNOSIS — R609 Edema, unspecified: Secondary | ICD-10-CM

## 2014-02-02 DIAGNOSIS — M79609 Pain in unspecified limb: Secondary | ICD-10-CM

## 2014-02-02 LAB — COMPREHENSIVE METABOLIC PANEL
ALBUMIN: 2.2 g/dL — AB (ref 3.5–5.2)
ALT: 12 U/L (ref 0–35)
AST: 17 U/L (ref 0–37)
Alkaline Phosphatase: 59 U/L (ref 39–117)
BUN: 9 mg/dL (ref 6–23)
CALCIUM: 8.3 mg/dL — AB (ref 8.4–10.5)
CO2: 25 mEq/L (ref 19–32)
CREATININE: 0.97 mg/dL (ref 0.50–1.10)
Chloride: 108 mEq/L (ref 96–112)
GFR calc Af Amer: 63 mL/min — ABNORMAL LOW (ref >90)
GFR calc non Af Amer: 54 mL/min — ABNORMAL LOW (ref >90)
Glucose, Bld: 98 mg/dL (ref 70–99)
Potassium: 4.2 mEq/L (ref 3.7–5.3)
Sodium: 142 mEq/L (ref 137–147)
TOTAL PROTEIN: 6.2 g/dL (ref 6.0–8.3)
Total Bilirubin: <0.2 mg/dL — ABNORMAL LOW (ref 0.3–1.2)

## 2014-02-02 MED ORDER — SENNA 8.6 MG PO TABS: 2.0000 | ORAL_TABLET | Freq: Every day | ORAL | Status: DC

## 2014-02-02 MED ORDER — DOCUSATE SODIUM 100 MG PO CAPS
100.0000 mg | ORAL_CAPSULE | Freq: Two times a day (BID) | ORAL | Status: DC
  Administered 2014-02-02: 100 mg via ORAL
  Filled 2014-02-02 (×2): qty 1

## 2014-02-02 MED ORDER — POLYETHYLENE GLYCOL 3350 17 G PO PACK: 17.0000 g | PACK | Freq: Every day | ORAL | Status: DC

## 2014-02-02 MED ORDER — SENNA 8.6 MG PO TABS
2.0000 | ORAL_TABLET | Freq: Every day | ORAL | Status: DC
  Administered 2014-02-02: 17.2 mg via ORAL
  Filled 2014-02-02: qty 2

## 2014-02-02 NOTE — Progress Notes (Signed)
Pt tolerated lunch well and feels ready for discharge. Pt d/c'd in stable condition in a wheelchair with family at bedside

## 2014-02-02 NOTE — Discharge Summary (Signed)
Physician Discharge Summary  [name] [name] [id number] DOB: 02/01/1933 DOA: 01/29/2014  PCP: Walker Kehr, MD  Admit date: 01/29/2014 Discharge date: 02/02/2014  Recommendations for Outpatient Follow-up:  1. Pt will need to follow up with PCP in 2 weeks post discharge 2. Please obtain BMP to evaluate electrolytes and kidney function 3. Please also check CBC to evaluate Hg and Hct levels Discharge Diagnoses:  Active Problems:   HYPOTHYROIDISM   HYPERTENSION   Acute pancreatitis   Chest pain   Chronic pancreatitis Acute on chronic pancreatitis  -Lipase increased 158---> 1153 (5/28)  -01/30/14 Patient was made n.p.o. and started on intravenous fluids  -Patient recently finished doxycycline which may have caused exacerbation  -01/31/2014--started on clear liquids which the patient tolerated  -02/01/2014--start full liquid diet  -02/02/2014--patient tolerated cardiac diet -Antiemetics prn  -Abdominal ultrasound--negative for any ductal dilatation or acute pathology  -Lipid panel--triglycerides 114  Atypical chest pain  -Likely related to her chronic pancreatitis and possibly musculoskeletal  -There was some reproducibility on palpation  -Cycle troponins--neg x 3  -EKG--sinus rhythm, no ST-T wave changes  -d/c tele  -Continue aspirin  -05/28/2013 nuc med stress test was low risk--no reversibility  -Patient was pain-free throughout the rest of the hospitalization Left lower extremity edema and pain -Duplex negative for DVT Hypertension  -Continue amlodipine  Hypothyroidism  -Continue Synthroid  History of stroke  -Continue aspirin  Family Communication: daughter updated at bedside  Disposition Plan: Home when medically stable   Discharge Condition:   stable Disposition:  home   Diet:cardiac Wt Readings from Last 3 Encounters:  02/02/14 61.462 kg (135 lb 8 oz)  01/06/14 61.871 kg (136 lb 6.4 oz)  10/15/13 63.504 kg (140 lb)    History of present illness:    80 year old female with a history of hypertension, hyperlipidemia, stroke, hypothyroidism, and chronic pancreatitis with pseudocyst presented with chest pain and epigastric pain that began on the evening prior to admission. The patient felt that it started after she ate some hot dogs. She describes the sensation as being dull and sharp occurring at rest without exacerbation during activity. She denies any dizziness, diaphoresis, nausea, vomiting, coughing, hemoptysis. There is no fevers or chills. The patient states she recently finished a course of doxycycline for a urinary tract infection. She denies any new medications, but she takes approximately 400-600 mg of ibuprofen 2 times per week for her shoulder pain. Presently, the patient states that her chest pain is significantly improved. Notably, the patient had a nuclear medicine stress test on 05/28/2013 which was low risk.  In the emergency department, her vital signs were stable. CMP and CBC were unremarkable. Lipase was 158. Urinalysis was negative for pyuria. Point-of-care troponin was negative. Serum creatinine was 1.16. Chest x-ray was negative. CT of the abdomen and pelvis showed a cystic lesion on anterior aspect of the pancreas with diffuse atrophy of the body and tail which was unchanged. Abdominal ultrasound was obtained and was negative for any ductal dilatation. The patient was Status post cholecystectomy. The patient developed abdominal pain and vomiting after eating. Her lipase was elevated to over 1000. The patient was made n.p.o. and started on intravenous fluids. The patient clinically improved with bowel rest and conservative therapy. Her diet was gradually advanced. She tolerated it well. Regarding her chest discomfort, troponins were negative. This was felt to be more of a gastroenterology etiology.    Discharge Exam: Filed Vitals:   02/02/14 0557  BP: 146/51  Pulse: 69  Temp: 97.6  F (36.4 C)  Resp: 18   Filed Vitals:    02/01/14 1424 02/01/14 2021 02/01/14 2110 02/02/14 0557  BP: 138/44 175/50 154/60 146/51  Pulse: 72 78  69  Temp: 98.1 F (36.7 C) 98.2 F (36.8 C)  97.6 F (36.4 C)  TempSrc: Oral Oral  Oral  Resp: 16 18  18   Height:      Weight:    61.462 kg (135 lb 8 oz)  SpO2: 99% 98%  98%   General: A&O x 3, NAD, pleasant, cooperative Cardiovascular: RRR, no rub, no gallop, no S3 Respiratory: CTAB, no wheeze, no rhonchi Abdomen:soft, nontender, nondistended, positive bowel sounds Extremities: 1+LLE  edema, No lymphangitis, no petechiae  Discharge Instructions      Discharge Instructions   Diet - low sodium heart healthy    Complete by:  As directed      Increase activity slowly    Complete by:  As directed             Medication List    STOP taking these medications       RA P-COL RITE PO      TAKE these medications       amLODipine 2.5 MG tablet  Commonly known as:  NORVASC  Take 1 tablet (2.5 mg total) by mouth 2 (two) times daily.     aspirin 325 MG tablet  Take 1 tablet (325 mg total) by mouth daily.     cholecalciferol 1000 UNITS tablet  Commonly known as:  VITAMIN D  Take 1,000 Units by mouth daily.     cyanocobalamin 1000 MCG/ML injection  Commonly known as:  (VITAMIN B-12)  Inject 1 mL (1,000 mcg total) into the skin every 14 (fourteen) days.     vitamin B-12 1000 MCG tablet  Commonly known as:  CYANOCOBALAMIN  Take 1,000 mcg by mouth every evening.     folic acid A999333 MCG tablet  Commonly known as:  FOLVITE  Take 400 mcg by mouth daily.     ibuprofen 200 MG tablet  Commonly known as:  ADVIL,MOTRIN  Take 200-400 mg by mouth every 6 (six) hours as needed for headache or moderate pain.     levothyroxine 88 MCG tablet  Commonly known as:  SYNTHROID, LEVOTHROID  Take 88 mcg by mouth daily before breakfast.     omeprazole 40 MG capsule  Commonly known as:  PRILOSEC  Take 40-80 mg by mouth daily as needed (reflux).     polyethylene glycol packet    Commonly known as:  MIRALAX / GLYCOLAX  Take 17 g by mouth daily.     senna 8.6 MG Tabs tablet  Commonly known as:  SENOKOT  Take 2 tablets (17.2 mg total) by mouth daily.     triamcinolone ointment 0.5 %  Commonly known as:  KENALOG  Apply 1 application topically daily as needed.         The results of significant diagnostics from this hospitalization (including imaging, microbiology, ancillary and laboratory) are listed below for reference.    Significant Diagnostic Studies: Dg Chest 2 View  01/29/2014   CLINICAL DATA:  Mid chest pain  EXAM: CHEST  2 VIEW  COMPARISON:  07/18/2013  FINDINGS: Heart, mediastinum hila are normal.  Lungs show areas of coarse reticular opacity consistent scarring. This is stable. No lung consolidation or edema. No pleural effusion or pneumothorax. Lungs are mildly hyperexpanded.  Bony thorax is demineralized but intact.  IMPRESSION: No acute cardiopulmonary disease.  Electronically Signed   By: Lajean Manes M.D.   On: 01/29/2014 09:47   US Abdomen Complete  01/30/2014   CLINICAL DATA:  Pancreatitis.  EXAM: ULTRASOUND ABDOMEN COMPLETE  COMPARISON:  CT 01/29/2014.  FINDINGS: Gallbladder:  Cholecystectomy.  Common bile duct:  Diameter: 2.5 mm  Liver:  No focal lesion identified. Within normal limits in parenchymal echogenicity.  IVC:  No abnormality visualized.  Pancreas:  Not visualized.  Spleen:  Size and appearance within normal limits.  Right Kidney:  Length: 9.0 cm. 6 mm simple cyst upper pole right kidney . Echogenicity within normal limits. No hydronephrosis.  Left Kidney:  Length: 9.1 cm. Echogenicity within normal limits. No mass or hydronephrosis visualized.  Abdominal aorta:  Not well visualized.  No aneurysm identified.  Other findings:  None.  IMPRESSION: 1. Cholecystectomy.  No biliary distention. 2. No acute abnormality otherwise noted. The pancreatic region was not visualized. Reference is made to CT report 01/29/2014.   Electronically Signed   By:  Marcello Moores  Register   On: 01/30/2014 18:53   Ct Abdomen Pelvis W Contrast  01/29/2014   CLINICAL DATA:  Left-sided abdominal pain  EXAM: CT ABDOMEN AND PELVIS WITH CONTRAST  TECHNIQUE: Multidetector CT imaging of the abdomen and pelvis was performed using the standard protocol following bolus administration of intravenous contrast.  CONTRAST:  73mL OMNIPAQUE IOHEXOL 300 MG/ML  SOLN  COMPARISON:  12/09/2013  FINDINGS: The lung bases are free of acute infiltrate or sizable effusion.  The liver, spleen, adrenal glands and kidneys are within normal limits. Small subcentimeter cysts are again noted within the kidneys and stable from the prior exam. The gallbladder has been removed. A cystic-appearing lesion is noted along the anterior aspect of the pancreas with diffuse atrophy of the body and tail similar to that seen on the prior exam.  The appendix is been surgically removed. Diffuse diverticular change is noted changes of diverticulitis seen previously have resolved in the interval. No abscess is seen. The bladder is well distended. The uterus is been surgically removed. Postsurgical changes are noted in the lumbar spine. Diffuse aortoiliac calcifications are seen. No acute bony abnormality is noted.  IMPRESSION: Diverticulosis without diverticulitis. The previously seen pericolonic inflammatory changes have resolved in the interval.  Stable pancreatic pseudocyst with atrophy of the pancreas similar to that seen on the prior exam.  Chronic changes as described above.  No acute abnormality is noted.   Electronically Signed   By: Inez Catalina M.D.   On: 01/29/2014 11:18     Microbiology: No results found for this or any previous visit (from the past 240 hour(s)).   Labs: Basic Metabolic Panel:  Recent Labs Lab 01/29/14 0918 01/30/14 0118 01/31/14 0652 02/01/14 0510 02/02/14 0522  NA 138 138 141 138 142  K 3.8 3.9 4.1 4.1 4.2  CL 100 102 104 106 108  CO2 23 24 23 24 25   GLUCOSE 111* 118* 80 89 98    BUN 29* 22 19 16 9   CREATININE 1.16* 1.09 1.08 0.99 0.97  CALCIUM 9.4 8.4 8.6 8.3* 8.3*   Liver Function Tests:  Recent Labs Lab 01/29/14 0918 01/30/14 0118 02/02/14 0522  AST 18 14 17   ALT 13 10 12   ALKPHOS 85 72 59  BILITOT 0.6 0.6 <0.2*  PROT 8.3 7.0 6.2  ALBUMIN 3.1* 2.6* 2.2*    Recent Labs Lab 01/29/14 0918 01/30/14 0118  LIPASE 158* 1153*   No results found for this basename: AMMONIA,  in the last  168 hours CBC:  Recent Labs Lab 01/29/14 0918 01/31/14 0652  WBC 7.5 8.4  HGB 11.6* 9.4*  HCT 34.0* 27.5*  MCV 89.7 90.8  PLT 248 179   Cardiac Enzymes:  Recent Labs Lab 01/29/14 1330 01/29/14 2019 01/30/14 0118  TROPONINI <0.30 <0.30 <0.30   BNP: No components found with this basename: POCBNP,  CBG: No results found for this basename: GLUCAP,  in the last 168 hours  Time coordinating discharge:  Greater than 30 minutes  Signed:  Orson Eva, DO [facility] Pager: [phone] 02/02/2014, 12:19 PM

## 2014-02-02 NOTE — Progress Notes (Signed)
VASCULAR LAB PRELIMINARY  PRELIMINARY  PRELIMINARY  PRELIMINARY  Left lower extremity venous Doppler completed.    Preliminary report:  There is no DVT or SVT noted in the left lower extremity.  Iantha Fallen, RVT 02/02/2014, 11:13 AM

## 2014-02-13 ENCOUNTER — Telehealth: Payer: Self-pay | Admitting: Internal Medicine

## 2014-02-13 NOTE — Telephone Encounter (Signed)
rec'd records from [place]., Forwarding 2 pages to Dr.Plotnikov

## 2014-03-08 ENCOUNTER — Other Ambulatory Visit: Payer: Self-pay | Admitting: Internal Medicine

## 2014-03-10 ENCOUNTER — Other Ambulatory Visit: Payer: Self-pay | Admitting: Internal Medicine

## 2014-04-21 ENCOUNTER — Other Ambulatory Visit: Payer: Self-pay | Admitting: Internal Medicine

## 2014-06-28 ENCOUNTER — Other Ambulatory Visit: Payer: Self-pay | Admitting: Internal Medicine

## 2014-06-30 ENCOUNTER — Other Ambulatory Visit: Payer: Self-pay

## 2014-06-30 NOTE — Telephone Encounter (Signed)
Pt is requesting Neurontin 100mg . The med has been d/c. Please advise.

## 2014-07-23 IMAGING — CR DG CHEST 2V
2 series · 2 of 2 positions shown · non-contrast
Comparison: 07/18/2013

CLINICAL DATA: Mid chest pain

EXAM:
CHEST  2 VIEW

[w chest pa]
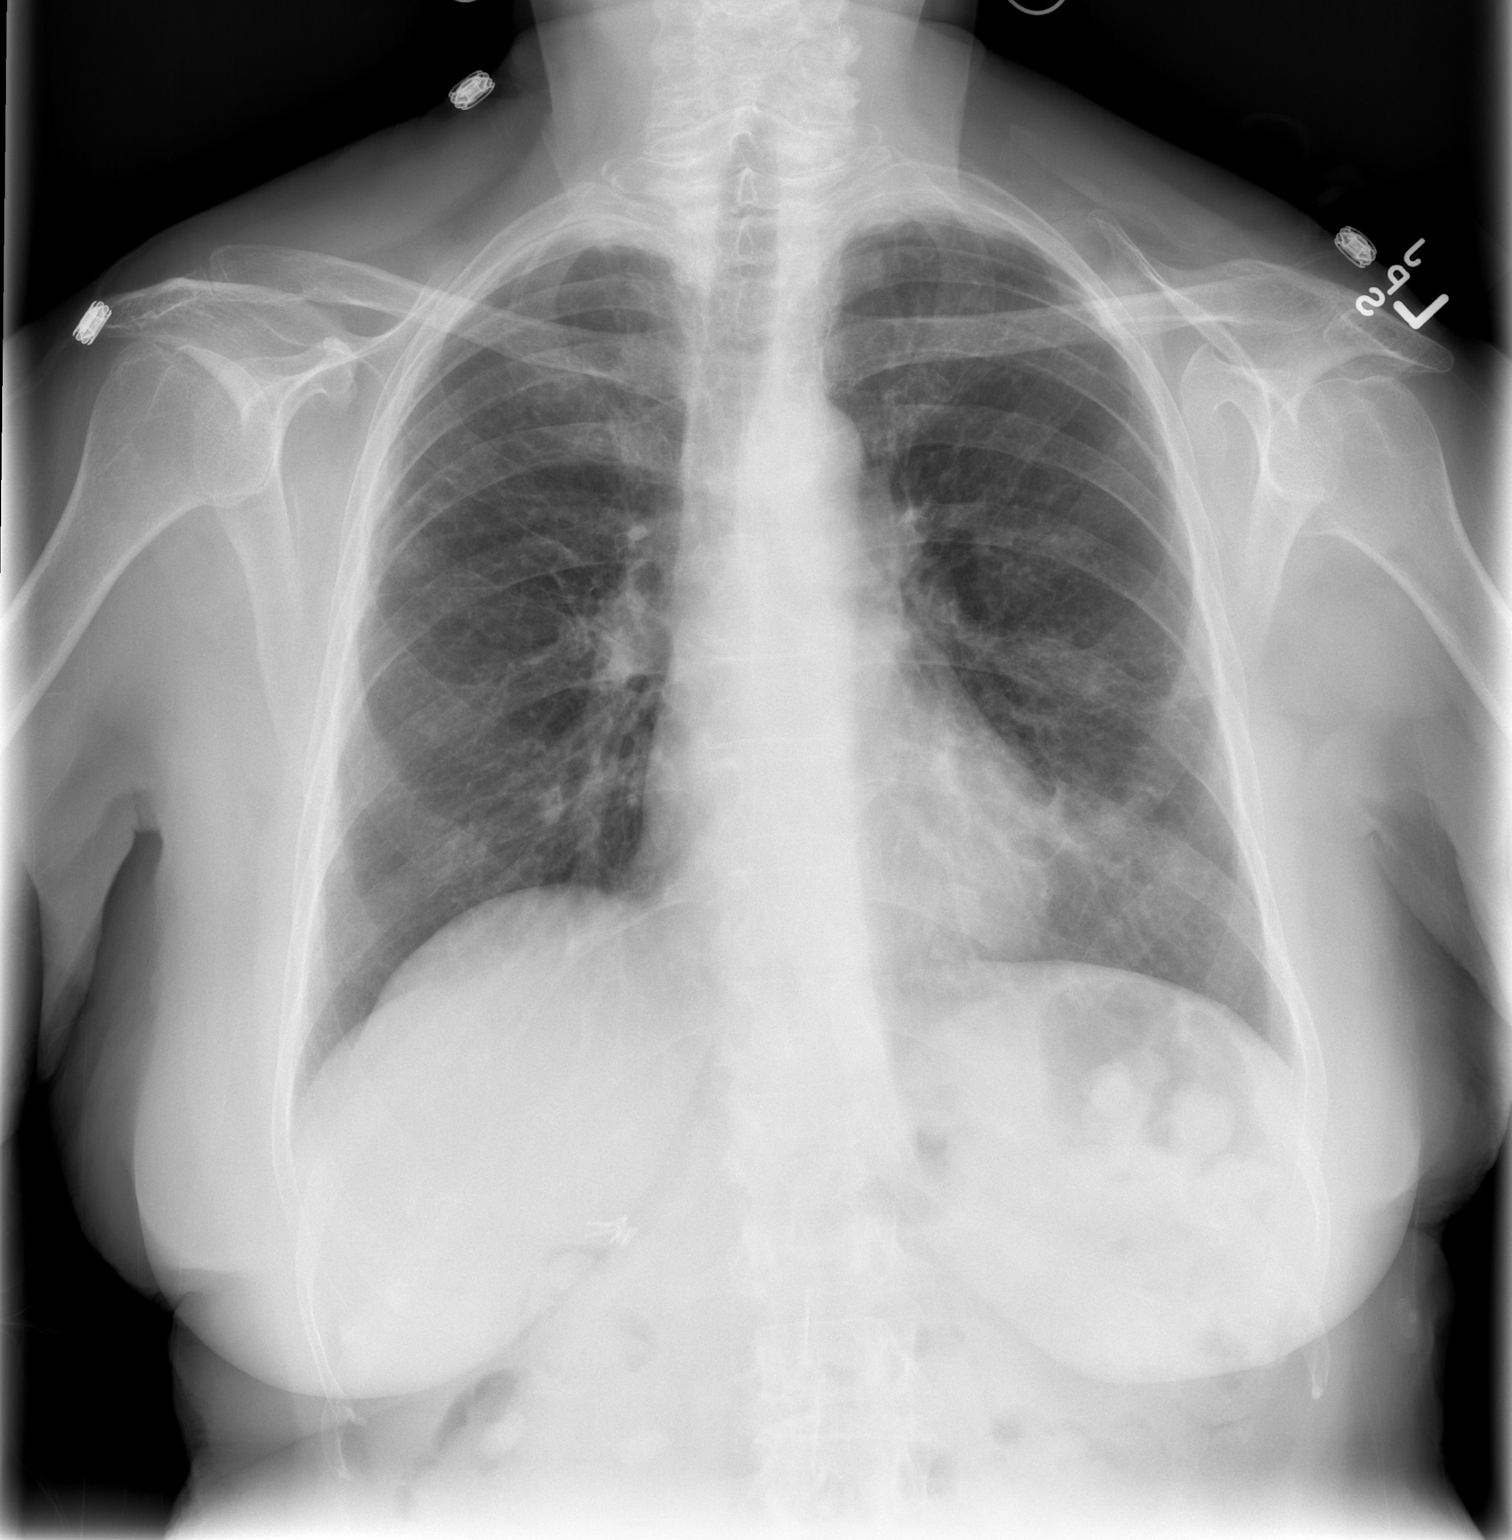

[w chest lat]
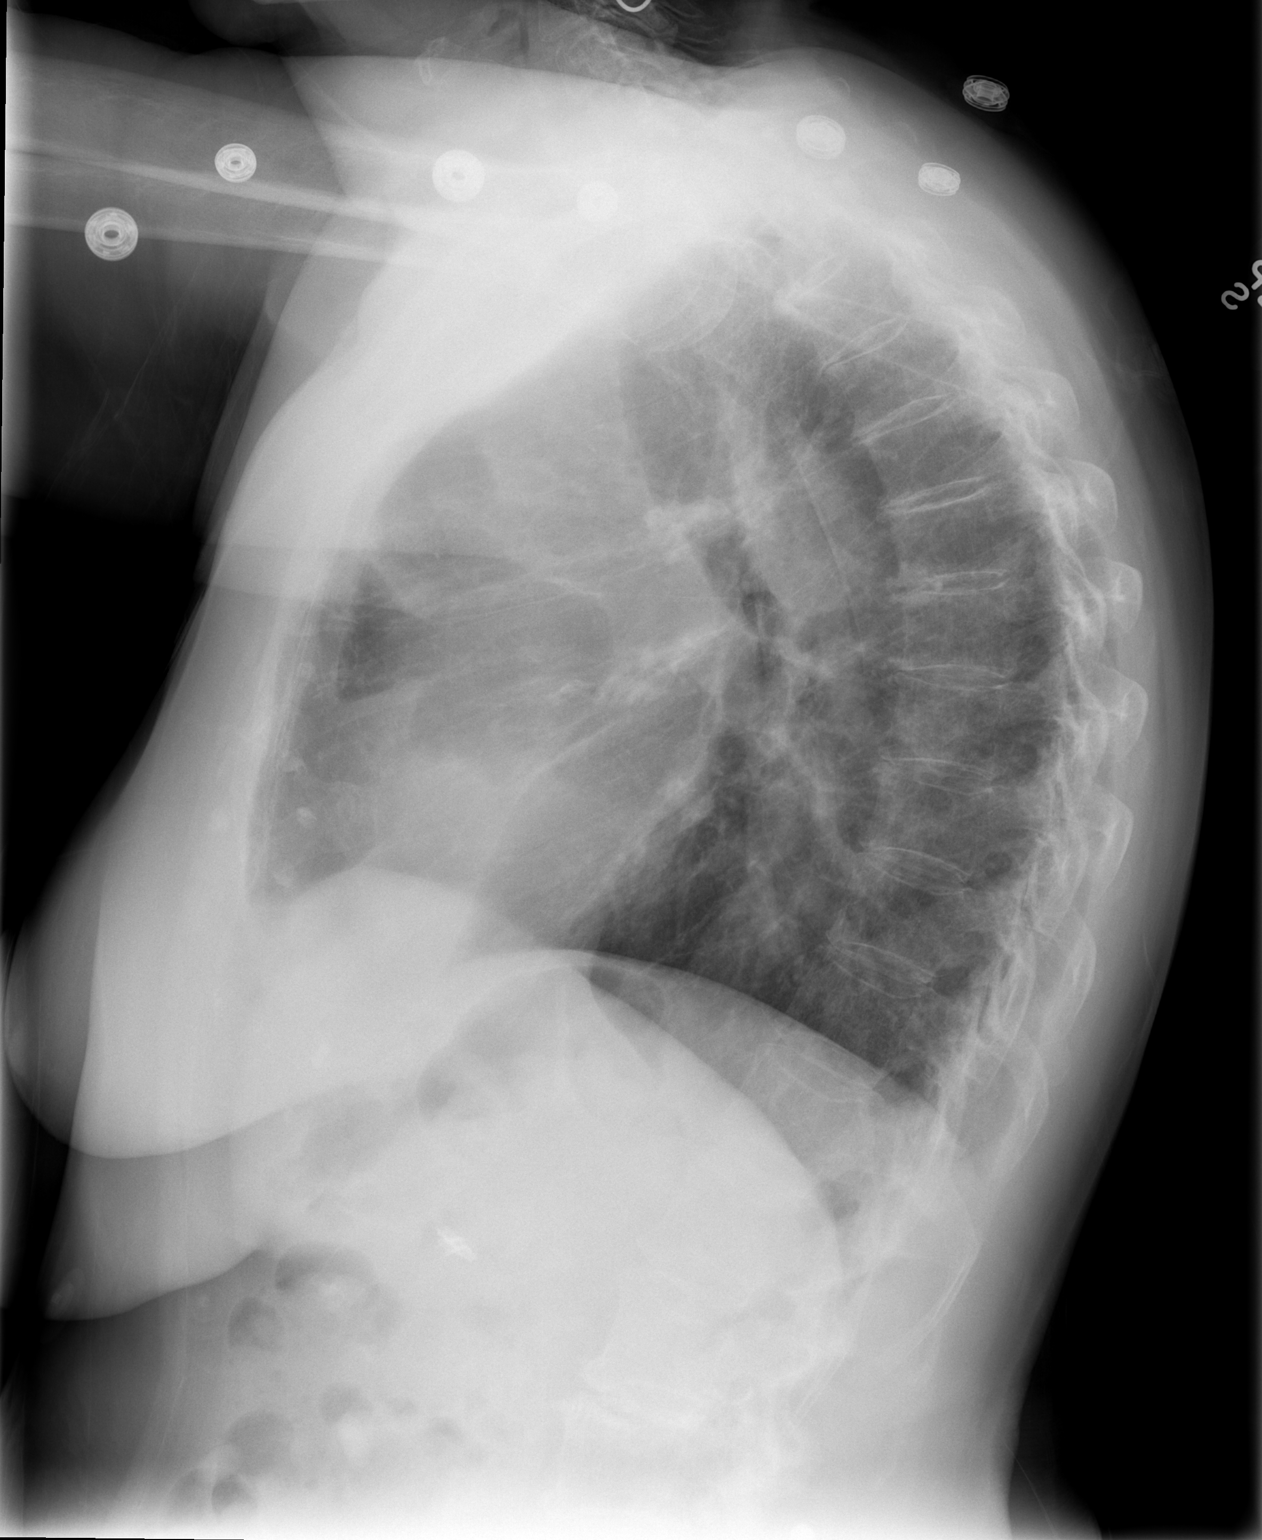

[2 of 2 positions shown; findings below may reference images not displayed]

FINDINGS: Heart, mediastinum hila are normal.

Lungs show areas of coarse reticular opacity consistent scarring.
This is stable. No lung consolidation or edema. No pleural effusion
or pneumothorax. Lungs are mildly hyperexpanded.

Bony thorax is demineralized but intact.
IMPRESSION: No acute cardiopulmonary disease.

## 2014-08-05 DIAGNOSIS — W19XXXA Unspecified fall, initial encounter: Secondary | ICD-10-CM

## 2014-08-05 HISTORY — DX: Unspecified fall, initial encounter: W19.XXXA

## 2014-09-01 ENCOUNTER — Ambulatory Visit
Admission: RE | Admit: 2014-09-01 | Discharge: 2014-09-01 | Disposition: A | Discharge status: Home / Self Care | Payer: Medicare Other | Source: Ambulatory Visit | Attending: Orthopedic Surgery | Admitting: Orthopedic Surgery

## 2014-09-01 ENCOUNTER — Other Ambulatory Visit: Payer: Self-pay | Admitting: Orthopedic Surgery

## 2014-09-01 DIAGNOSIS — T148XXA Other injury of unspecified body region, initial encounter: Secondary | ICD-10-CM

## 2014-09-09 ENCOUNTER — Telehealth: Payer: Self-pay | Admitting: Neurology

## 2014-09-09 NOTE — Telephone Encounter (Signed)
Patient's daughter Lynelle Smoke checking status of Surgery Clearance form faxed from Dr. Mardelle Matte on 12/30.  Patient scheduled for shoulder surgery on 09/13/14.  Please call and advise.

## 2014-09-09 NOTE — Telephone Encounter (Signed)
Spoke with patient's daughter Lynelle Smoke and informed her that Dr Leonie Man has been out of town and will return her call upon arrival, she verbalized understanding and will wait for a return call from Dr Leonie Man.

## 2014-09-11 NOTE — Telephone Encounter (Signed)
Ok to hold aspirin for 3-5 days prior to shoulder surgery and resume after surgery when safe. Kindly inform patient

## 2014-09-12 NOTE — Telephone Encounter (Signed)
Spoke with Tammy and informed her that per Dr Leonie Man it is okay for patient to hold the Asprin 3-5 days prior to surgery and resume when safe. Tammy verbalized understanding and had no further concerns.

## 2014-11-04 ENCOUNTER — Encounter: Payer: Self-pay | Admitting: *Deleted

## 2014-11-06 ENCOUNTER — Other Ambulatory Visit (INDEPENDENT_AMBULATORY_CARE_PROVIDER_SITE_OTHER): Payer: Medicare Other | Admitting: Neurology

## 2014-11-06 ENCOUNTER — Encounter: Payer: Self-pay | Admitting: Neurology

## 2014-11-06 ENCOUNTER — Ambulatory Visit (INDEPENDENT_AMBULATORY_CARE_PROVIDER_SITE_OTHER): Payer: Medicare Other | Admitting: Neurology

## 2014-11-06 VITALS — BP 163/74 | HR 74 | Ht 63.0 in | Wt 140.4 lb

## 2014-11-06 DIAGNOSIS — R413 Other amnesia: Secondary | ICD-10-CM | POA: Diagnosis not present

## 2014-11-06 DIAGNOSIS — F329 Major depressive disorder, single episode, unspecified: Secondary | ICD-10-CM

## 2014-11-06 DIAGNOSIS — F32A Depression, unspecified: Secondary | ICD-10-CM

## 2014-11-06 MED ORDER — BUPROPION HCL ER (SR) 100 MG PO TB12: 100.0000 mg | ORAL_TABLET | Freq: Two times a day (BID) | ORAL | Status: DC

## 2014-11-06 NOTE — Patient Instructions (Addendum)
I I had a long discussion with the patient and daughter regarding her recent complaints of memory loss which perhaps are related to mild age-related cognitive impairment as well as damage, previous strokes and her ongoing untreated depression. I recommend we check memory panel labs, EEG and MRI scan. Trial of Wellbutrin for treatment of her depression. I'll discuss side effects and advise the patient to call me if needed. Continue aspirin for stroke prevention and strict control of hypertension with blood pressure goal below 130/90 and lipids with LDL cholesterol goal below 70 mg percent. Return for follow-up in 2 months or call earlier if necessary. Management of Memory Problems  There are some general things you can do to help manage your memory problems.  Your memory may not in fact recover, but by using techniques and strategies you will be able to manage your memory difficulties better.  1)  Establish a routine.  Try to establish and then stick to a regular routine.  By doing this, you will get used to what to expect and you will reduce the need to rely on your memory.  Also, try to do things at the same time of day, such as taking your medication or checking your calendar first thing in the morning.  Think about think that you can do as a part of a regular routine and make a list.  Then enter them into a daily planner to remind you.  This will help you establish a routine.  2)  Organize your environment.  Organize your environment so that it is uncluttered.  Decrease visual stimulation.  Place everyday items such as keys or cell phone in the same place every day (ie.  Basket next to front door)  Use post it notes with a brief message to yourself (ie. Turn off light, lock the door)  Use labels to indicate where things go (ie. Which cupboards are for food, dishes, etc.)  Keep a notepad and pen by the telephone to take messages  3)  Memory Aids  A diary or journal/notebook/daily  planner  Making a list (shopping list, chore list, to do list that needs to be done)  Using an alarm as a reminder (kitchen timer or cell phone alarm)  Using cell phone to store information (Notes, Calendar, Reminders)  Calendar/White board placed in a prominent position  Post-it notes  In order for memory aids to be useful, you need to have good habits.  It's no good remembering to make a note in your journal if you don't remember to look in it.  Try setting aside a certain time of day to look in journal.  4)  Improving mood and managing fatigue.  There may be other factors that contribute to memory difficulties.  Factors, such as anxiety, depression and tiredness can affect memory.  Regular gentle exercise can help improve your mood and give you more energy.  Simple relaxation techniques may help relieve symptoms of anxiety  Try to get back to completing activities or hobbies you enjoyed doing in the past.  Learn to pace yourself through activities to decrease fatigue.  Find out about some local support groups where you can share experiences with others.  Try and achieve 7-8 hours of sleep at night.

## 2014-11-07 LAB — DEMENTIA PANEL
Homocysteine: 15 umol/L (ref 0.0–15.0)
RPR: NONREACTIVE
TSH: 0.149 u[IU]/mL — ABNORMAL LOW (ref 0.450–4.500)
VITAMIN B 12: 1246 pg/mL — AB (ref 211–946)

## 2014-11-08 NOTE — Progress Notes (Signed)
[facility] [address] [place]. [place] [address] [phone]       OFFICE CONSULT NOTE  [name]. [name] [name] Date of Birth:  22-Nov-1932 Medical Record Number:  [id number]   Referring MD:  Rochel Brome  Reason for Referral:  Memory loss and stroke  HPI: [name] [name] is a 81 year old pleasant lady who I had last seen me in May 2014 when she had had a stroke.She presented with with pancreatitis to [facility] and imaging demonstrated dilated common bile duct and gallbladder stone. Hospital course was complicated by oliguria, encephalopathy and infection. She developed respiratory failure and was transferred to [facility]. She was noted having slurred speech and some right facial weakness. According to was called but she was not a TPA candidate as last known whether time was not clearly known. CT of the head was unremarkable but subsequently MRI scan demonstrated multiple small bilateral cortical and cerebellar acute as well as subacute infarcts. These were felt to be of embolic etiology but workup for source of embolism was unrewarding. TEE showed could not be completed due to patient having excessive secretions in the airway and being unable to swallow. Telemetry monitoring did not reveal cardiac arrhythmias. Transcranial Doppler studies and carotid Dopplers were unremarkable. 2-D echo showed ejection fraction of 80% with hypertrophic cardiomyopathy. Outpatient prolonged cardiac telemetry monitoring was done for 3 weeks I do not have the actual results but presumably this was normal. V She subsequently improved with therapy and did not keep follow-up with me. Since November of last year she has had a couple falls and she hit her back of her head contusion. Since then the family feels that she's had some memory difficulties. She fractured her shoulder during one of the falls and she still has pain. She feels her balance was off and she has to walk slowly to avoid falling. Her  husband has Alzheimer's dementia and her family is struggling to help him and the patient herself is concerned with having memory difficulties whether she may be getting the same. She was seen at [facility] on 08/13/14 where she had an MRI scan which showed encephalomalacia from an old stroke but no acute abnormality. I have reviewed the films personally. Patient denies any headaches any double vision or any focal extremity weakness or numbness. She has no known history of depression her prior memory problems. She states her memory difficulties and short-term memory problems. She still able to do most activities for herself but her daughters have started helping her much more. She has never been on treatment for depression though she tested mildly positive today on the geriatric depression scale. On Mini-Mental status exam she scored 25/30. She has not had such cognitive testing done in the past.  ROS:   14 system review of systems is positive for anemia, easy bruising, black spots, memory loss, speech difficulty, restless legs and all other systems negative  PMH:  Past Medical History  Diagnosis Date  . Hypertension   . Hyperlipidemia   . Palpitations   . Thyroid disease   . Weakness generalized   . Anemia   . Other urinary problems   . Lightheadedness   . Breast pain   . Vasovagal syncope   . Cellulitis and abscess of unspecified digit   . Urinary tract infection, site not specified   . Urgency of urination   . Other symptoms involving abdomen and pelvis(789.9)   . Rash and other nonspecific skin eruption   . Unspecified  venous (peripheral) insufficiency   . Edema   . Osteoarthrosis, unspecified whether generalized or localized, unspecified site   . Monoclonal paraproteinemia   . Irritable bowel syndrome   . Other B-complex deficiencies   . Unspecified hypothyroidism   . GERD (gastroesophageal reflux disease)   . Anxiety   . Torus palatinus   . Pancreatitis   . Complication  of anesthesia   . PONV (postoperative nausea and vomiting)   . Dyspnea     w/ atypical upper airway symptoms? all vcd/lpr/gerd  . Pneumonia 01/2013  . Renal insufficiency     after dye test, have improved.  . Constipation   . Stroke 01/2013    right arm and hand weakness, memory loss  . Biliary acute pancreatitis 07/09/2013    Lap chole on 07/18/13     Social History:  History   Social History  . Marital Status: Married    Spouse Name: N/A  . Number of Children: 3  . Years of Education: 12   Occupational History  . Not on file.   Social History Main Topics  . Smoking status: Never Smoker   . Smokeless tobacco: Never Used  . Alcohol Use: No     Comment: Occasionally during the night to help sleep  . Drug Use: No  . Sexual Activity: Not on file   Other Topics Concern  . Not on file   Social History Narrative   Patient is married with 3 children.   Patient has hs education.    Medications:   Current Outpatient Prescriptions on File Prior to Visit  Medication Sig Dispense Refill  . amLODipine (NORVASC) 2.5 MG tablet Take 1 tablet (2.5 mg total) by mouth 2 (two) times daily. 60 tablet 12  . aspirin 325 MG tablet Take 1 tablet (325 mg total) by mouth daily.    . cholecalciferol (VITAMIN D) 1000 UNITS tablet Take 1,000 Units by mouth daily.      . cyanocobalamin (,VITAMIN B-12,) 1000 MCG/ML injection INJECT 1 ML EVERY 14 DAYS 30 mL 0  . folic acid (FOLVITE) A999333 MCG tablet Take 400 mcg by mouth daily.    Marland Kitchen gabapentin (NEURONTIN) 100 MG capsule TAKE 1 TO 2 CAPSULES EVERY DAY AT BEDTIME AS NEEDED FOR RESTLESS LEGS 90 capsule 0  . HYDROcodone-acetaminophen (NORCO/VICODIN) 5-325 MG per tablet Take 1 tablet by mouth as needed.  0  . ibuprofen (ADVIL,MOTRIN) 200 MG tablet Take 200-400 mg by mouth every 6 (six) hours as needed for headache or moderate pain.    Marland Kitchen levothyroxine (SYNTHROID, LEVOTHROID) 88 MCG tablet Take 88 mcg by mouth daily before breakfast.    . polyethylene  glycol (MIRALAX / GLYCOLAX) packet Take 17 g by mouth daily. 14 each 0  . PRILOSEC OTC 20 MG tablet TAKE 2 TABLETS BY MOUTH ONCE A DAY (Patient taking differently: TAKE 2 TABLETS BY MOUTH BID) 168 tablet 2  . triamcinolone ointment (KENALOG) 0.5 % Apply 1 application topically daily as needed.     . vitamin B-12 (CYANOCOBALAMIN) 1000 MCG tablet Take 1,000 mcg by mouth every evening.     No current facility-administered medications on file prior to visit.    Allergies:   Allergies  Allergen Reactions  . Cefuroxime Axetil     REACTION: nausea She was able to tolerate Vantin fine  . Labetalol     Ortho BP drop  . Nitrofurantoin     REACTION: nausea  . Sulfadiazine Rash    Physical Exam General: well developed,  well nourished, seated, in no evident distress Head: head normocephalic and atraumatic.   Neck: supple with no carotid or supraclavicular bruits Cardiovascular: regular rate and rhythm, no murmurs Musculoskeletal: no deformity Skin:  no rash/petichiae Vascular:  Normal pulses all extremities Filed Vitals:   11/06/14 1317  BP: 163/74  Pulse: 74    Neurologic Exam Mental Status: Awake and fully alert. Oriented to place and time. Recent and remote memory intact. Attention span, concentration and fund of knowledge appropriate. Mood and affect appropriate.  Cranial Nerves: Fundoscopic exam reveals sharp disc margins. Pupils equal, briskly reactive to light. Extraocular movements full without nystagmus. Visual fields show partial left homonymous hemianopsia to confrontation testing. Hearing intact. Facial sensation intact. Face, tongue, palate moves normally and symmetrically.  Motor: Normal bulk and tone. Normal strength in all tested extremity muscles. Sensory.: intact to touch , pinprick , position and vibratory sensation.  Coordination: Rapid alternating movements normal in all extremities. Finger-to-nose and heel-to-shin performed accurately bilaterally. Gait and Station:  Arises from chair without difficulty. Stance is normal. Gait demonstrates normal stride length and balance . Able to heel, toe and tandem walk without difficulty.  Reflexes: 1+ and symmetric. Toes downgoing.   NIHSS  2 Modified Rankin  2   ASSESSMENT: 76 year lady with new complaints of memory and cognitive difficulties with concern for new strokes. Neurologic the neurological exam shows only mild cognitive impairment and cold neurological deficits, prior strokes    PLAN: I had a long discussion with the patient and daughter regarding her recent complaints of memory loss which perhaps are related to mild age-related cognitive impairment as well as damage, from previous strokes and her ongoing untreated depression. I recommend we check memory panel labs, EEG and MRI scan. Trial of Wellbutrin for treatment of her depression. I'll discuss side effects and advise the patient to call me if needed. Continue aspirin for stroke prevention and strict control of hypertension with blood pressure goal below 130/90 and lipids with LDL cholesterol goal below 70 mg percent. Return for follow-up in 2 months or call earlier if necessary.   Note: This document was prepared with digital dictation and possible smart phrase technology. Any transcriptional errors that result from this process are unintentional.

## 2014-11-22 ENCOUNTER — Ambulatory Visit
Admission: RE | Admit: 2014-11-22 | Discharge: 2014-11-22 | Disposition: A | Discharge status: Home / Self Care | Payer: Self-pay | Source: Ambulatory Visit | Attending: Neurology | Admitting: Neurology

## 2014-11-22 ENCOUNTER — Inpatient Hospital Stay: Admission: RE | Admit: 2014-11-22 | Payer: Self-pay | Source: Ambulatory Visit

## 2014-11-22 DIAGNOSIS — R413 Other amnesia: Secondary | ICD-10-CM

## 2014-12-06 ENCOUNTER — Other Ambulatory Visit: Payer: Self-pay

## 2014-12-15 ENCOUNTER — Other Ambulatory Visit: Payer: Self-pay | Admitting: *Deleted

## 2014-12-15 MED ORDER — OMEPRAZOLE MAGNESIUM 20 MG PO TBEC: 40.0000 mg | DELAYED_RELEASE_TABLET | Freq: Every day | ORAL | Status: AC

## 2014-12-31 ENCOUNTER — Other Ambulatory Visit: Payer: Self-pay | Admitting: Neurology

## 2015-01-04 ENCOUNTER — Other Ambulatory Visit: Payer: Self-pay | Admitting: Neurology

## 2015-01-16 ENCOUNTER — Other Ambulatory Visit: Payer: Self-pay | Admitting: Internal Medicine

## 2015-02-09 ENCOUNTER — Encounter: Payer: Self-pay | Admitting: Neurology

## 2015-02-09 ENCOUNTER — Ambulatory Visit (INDEPENDENT_AMBULATORY_CARE_PROVIDER_SITE_OTHER): Payer: Medicare Other | Admitting: Neurology

## 2015-02-09 VITALS — BP 160/78 | HR 71 | Wt 136.8 lb

## 2015-02-09 DIAGNOSIS — G3184 Mild cognitive impairment, so stated: Secondary | ICD-10-CM

## 2015-02-09 MED ORDER — CVS FISH OIL 1000 MG PO CAPS: 1.0000 | ORAL_CAPSULE | ORAL | Status: AC

## 2015-02-09 NOTE — Patient Instructions (Addendum)
I had a long discussion the patient and her daughter with regards to her mild cognitive impairment in personally reviewed and discuss results of MRI scan and EEG and answered questions. I recommend she start taking fish oil 1000 mg daily as well as Reserveratrol 200 mg daily to help with her memory impairment. She was also advised to increase participated in activities which are mentally challenging like playing bridge, sudoku, crossword puzzles. She may also consider participation in the CREAD dementia study if interested. Mild Neurocognitive Disorder Mild neurocognitive disorder (formerly known as mild cognitive impairment) is a mental disorder. It is a slight abnormal decrease in mental function. The areas of mental function affected may include memory, thought, communication, behavior, and completion of tasks. The decrease is noticeable and measurable but for the most part does not interfere with your daily activities. Mild neurocognitive disorder typically occurs in people older than 60 years but can occur earlier. It is not as serious as major neurocognitive disorder (formerly known as dementia) but may lead to a more serious neurocognitive disorder. However, in some cases the condition does not get worse. A few people with this disorder even improve. CAUSES  There are a number of different causes of mild neurocognitive disorder:   Brain disorders associated with abnormal protein deposits, such as Alzheimer's disease, Pick's disease, and Lewy body disease.  Brain disorders associated with abnormal movement, such as Parkinson's disease and Huntington's disease.  Diseases affecting blood vessels in the brain and resulting in mini-strokes.  Certain infections, such as human immunodeficiency virus (HIV) infection.  Traumatic brain injury.  Other medical conditions such as brain tumors, underactive thyroid (hypothyroidism), and vitamin B12 deficiency.  Use of certain prescription medicine and  "recreational" drugs. SYMPTOMS  Symptoms of mild neurocognitive disorder include:  Difficulty remembering. You may forget details of recent events, names, or phone numbers. You may forget important social events and appointments or repeatedly forget where you put your car keys.  Difficulty thinking and solving problems. You may have trouble with complex tasks such as paying bills or driving in unfamiliar locations.  Difficulty communicating. You may have trouble finding the right word, naming an object, forming a sentence that makes sense, or understanding what you read or hear.  Changes in your behavior or personality. You may lose interest in the things that you used to enjoy or withdraw from social situations. You may get angry more easily than usual. You may act before thinking. You may do things in public that you would not usually do. You may hear or see things that are not real (hallucinations). You may believe falsely that others are trying to hurt you (paranoia). DIAGNOSIS Mild neurocognitive disorder is diagnosed through an assessment by your health care provider. Your health care provider will ask you and your family, friends, or coworkers questions about your symptoms. He or she will ask how often the symptoms occur, how long they have been occurring, whether they are getting worse, and the effect they are having on your life. Your health care provider may refer you to a neurologist or mental health specialist for a detailed evaluation of your mental functions (neuropsychological testing).  To identify the cause of your mild neurocognitive disorder, your health care provider may:  Obtain a detailed medical history.  Ask about alcohol and drug use, including prescription medicine.  Perform a physical exam.  Order blood tests and brain imaging exams. TREATMENT  Mild neurocognitive disorder caused by infections, use of certain medicines or "recreational" drugs,  and certain medical  conditions may improve with treatment of the condition that is causing the disorder. Mild neurocognitive disorder resulting from other causes generally does not improve and may worsen. In these cases, the goal of treatment is to slow progression of the disorder and help you cope with the loss of mental function. Treatments in these cases include:   Medicine. Medicine helps mainly with memory loss and behavioral symptoms.   Talk therapy. Talk therapy provides education, emotional support, memory aids, and other ways of making up for decreases in mental function.   Lifestyle changes. These include regular exercise, a healthy diet (including essential omega-3 fatty acids), intellectual stimulation, and increased social interaction. Document Released: 04/24/2013 Document Revised: 01/06/2014 Document Reviewed: 04/24/2013 [facility] Patient Information 2015 [place], [place]. This information is not intended to replace advice given to you by your health care provider. Make sure you discuss any questions you have with your health care provider.

## 2015-02-09 NOTE — Progress Notes (Signed)
[facility] [address] [place] [address] [phone]       OFFICE FOLLOW UP VISIT NOTE  [name] Date of Birth:  Oct 11, 1932 Medical Record Number:  [id number]   Referring MD:  Rochel Brome  Reason for Referral:  Memory loss and stroke  HPI: [name] is a 81 year old pleasant lady who I had last seen me in May 2014 when she had had a stroke.She presented with with pancreatitis to [facility] and imaging demonstrated dilated common bile duct and gallbladder stone. Hospital course was complicated by oliguria, encephalopathy and infection. She developed respiratory failure and was transferred to [place]. She was noted having slurred speech and some right facial weakness. According to was called but she was not a TPA candidate as last known whether time was not clearly known. CT of the head was unremarkable but subsequently MRI scan demonstrated multiple small bilateral cortical and cerebellar acute as well as subacute infarcts. These were felt to be of embolic etiology but workup for source of embolism was unrewarding. TEE showed could not be completed due to patient having excessive secretions in the airway and being unable to swallow. Telemetry monitoring did not reveal cardiac arrhythmias. Transcranial Doppler studies and carotid Dopplers were unremarkable. 2-D echo showed ejection fraction of 80% with hypertrophic cardiomyopathy. Outpatient prolonged cardiac telemetry monitoring was done for 3 weeks I do not have the actual results but presumably this was normal. V She subsequently improved with therapy and did not keep follow-up with me. Since November of last year she has had a couple falls and she hit her back of her head contusion. Since then the family feels that she's had some memory difficulties. She fractured her shoulder during one of the falls and she still has pain. She feels her balance was off and she has to walk slowly to avoid  falling. Her husband has Alzheimer's dementia and her family is struggling to help him and the patient herself is concerned with having memory difficulties whether she may be getting the same. She was seen at [facility] on 08/13/14 where she had an MRI scan which showed encephalomalacia from an old stroke but no acute abnormality. I have reviewed the films personally. Patient denies any headaches any double vision or any focal extremity weakness or numbness. She has no known history of depression her prior memory problems. She states her memory difficulties and short-term memory problems. She still able to do most activities for herself but her daughters have started helping her much more. She has never been on treatment for depression though she tested mildly positive today on the geriatric depression scale. On Mini-Mental status exam she scored 25/30. She has not had such cognitive testing done in the past. Update 02/09/2015 : She returns for follow-up after initial consultation 3 months ago. She is accompanied by her daughter. She states some memory and cognitive difficulties about unchanged. She continues to have mainly short-term memory difficulties. She lives at home with her husband who has Alzheimer's and she is a caregiver. She is not able to drive and depends on her daughter but is independent in most other activities of daily living. She was started on Wellbutrin by me at last admission but she did not tolerate it well and made her quite emotional and and she stopped it. She denies feeling depressed and does not want to try any medications for that. She had EEG done on 11/06/14 which I have reviewed and was normal.  MRI scan of the brain done on 11/22/14 shows bilateral old lacunar infarcts without any acute finding. There is mild changes of generalized cerebral atrophy and chronic small vessel disease. She fell unfortunately and fractured her right shoulder but this is been treated conservatively  without surgery. TSH on 11/06/14 was 0.149 and her primary care physician is managing that. Vitamin B12 level was normal at 1246. Homocystine was 15.0. RPR was nonreactive. ROS:   14 system review of systems is positive for  memory loss, anxiety, nervousness, anemia, muscle cramps in the legs, restless legs, loss of vision, leg swelling, trouble swallowing, right shoulder pain from fracture and all other systems negative.  PMH:  Past Medical History  Diagnosis Date  . Hypertension   . Hyperlipidemia   . Palpitations   . Thyroid disease   . Weakness generalized   . Anemia   . Other urinary problems   . Lightheadedness   . Breast pain   . Vasovagal syncope   . Cellulitis and abscess of unspecified digit   . Urinary tract infection, site not specified   . Urgency of urination   . Other symptoms involving abdomen and pelvis(789.9)   . Rash and other nonspecific skin eruption   . Unspecified venous (peripheral) insufficiency   . Edema   . Osteoarthrosis, unspecified whether generalized or localized, unspecified site   . Monoclonal paraproteinemia   . Irritable bowel syndrome   . Other B-complex deficiencies   . Unspecified hypothyroidism   . GERD (gastroesophageal reflux disease)   . Anxiety   . Torus palatinus   . Pancreatitis   . Complication of anesthesia   . PONV (postoperative nausea and vomiting)   . Dyspnea     w/ atypical upper airway symptoms? all vcd/lpr/gerd  . Pneumonia 01/2013  . Renal insufficiency     after dye test, have improved.  . Constipation   . Stroke 01/2013    right arm and hand weakness, memory loss  . Biliary acute pancreatitis 07/09/2013    Lap chole on 07/18/13   . Fall 08/2014    fx shoulder    Social History:  History   Social History  . Marital Status: Married    Spouse Name: N/A  . Number of Children: 3  . Years of Education: 12   Occupational History  . Not on file.   Social History Main Topics  . Smoking status: Never Smoker   .  Smokeless tobacco: Never Used  . Alcohol Use: No     Comment: Occasionally during the night to help sleep  . Drug Use: No  . Sexual Activity: Not on file   Other Topics Concern  . Not on file   Social History Narrative   Patient is married with 3 children.   Patient has hs education.    Medications:   Current Outpatient Prescriptions on File Prior to Visit  Medication Sig Dispense Refill  . amLODipine (NORVASC) 2.5 MG tablet TAKE 1 TABLET TWICE A DAY 60 tablet 1  . aspirin 325 MG tablet Take 1 tablet (325 mg total) by mouth daily.    . cholecalciferol (VITAMIN D) 1000 UNITS tablet Take 1,000 Units by mouth daily.      . cyanocobalamin (,VITAMIN B-12,) 1000 MCG/ML injection INJECT 1 ML EVERY 14 DAYS 30 mL 0  . folic acid (FOLVITE) A999333 MCG tablet Take 400 mcg by mouth daily.    Marland Kitchen gabapentin (NEURONTIN) 100 MG capsule TAKE 1 TO 2 CAPSULES EVERY DAY AT  BEDTIME AS NEEDED FOR RESTLESS LEGS 90 capsule 0  . levothyroxine (SYNTHROID, LEVOTHROID) 88 MCG tablet Take 88 mcg by mouth daily before breakfast.    . omeprazole (PRILOSEC OTC) 20 MG tablet Take 2 tablets (40 mg total) by mouth daily. 60 tablet 0  . polyethylene glycol (MIRALAX / GLYCOLAX) packet Take 17 g by mouth daily. 14 each 0  . triamcinolone ointment (KENALOG) 0.5 % Apply 1 application topically daily as needed.      No current facility-administered medications on file prior to visit.    Allergies:   Allergies  Allergen Reactions  . Cefuroxime Axetil     REACTION: nausea She was able to tolerate Vantin fine  . Labetalol     Ortho BP drop  . Nitrofurantoin     REACTION: nausea  . Sulfadiazine Rash    Physical Exam General: well developed, well nourished, seated, in no evident distress Head: head normocephalic and atraumatic.   Neck: supple with no carotid or supraclavicular bruits Cardiovascular: regular rate and rhythm, no murmurs Musculoskeletal: no deformity Skin:  no rash/petichiae Vascular:  Normal pulses  all extremities Filed Vitals:   02/09/15 1049  BP: 160/78  Pulse: 71    Neurologic Exam Mental Status: Awake and fully alert. Oriented to place and time. Recent and remote memory intact. Attention span, concentration and fund of knowledge appropriate. Mood and affect appropriate.  Cranial Nerves: Fundoscopic exam not done  . Pupils equal, briskly reactive to light. Extraocular movements full without nystagmus. Visual fields show partial left homonymous hemianopsia to confrontation testing. Hearing intact. Facial sensation intact. Face, tongue, palate moves normally and symmetrically.  Motor: Normal bulk and tone. Normal strength in all tested extremity muscles. Sensory.: intact to touch , pinprick , position and vibratory sensation.  Coordination: Rapid alternating movements normal in all extremities. Finger-to-nose and heel-to-shin performed accurately bilaterally. Gait and Station: Arises from chair without difficulty. Stance is normal. Gait demonstrates normal stride length and balance . Able to heel, toe and tandem walk without difficulty.  Reflexes: 1+ and symmetric. Toes downgoing.   NIHSS  2 Modified Rankin  2   ASSESSMENT: 51 year lady with  complaints of memory and cognitive difficulties likely from mild cognitive impairment. Neurologic the neurological exam shows only mild cognitive impairment and old neurological deficits, prior strokes    PLAN: I had a long discussion the patient and her daughter with regards to her mild cognitive impairment in personally reviewed and discuss results of MRI scan and EEG and answered questions. I recommend she start taking fish oil 1000 mg daily as well as Reserveratrol 200 mg daily to help with her memory impairment. She was also advised to increase participated in activities which are mentally challenging like playing bridge, sudoku, crossword puzzles. I advised her to follow-up with her primary care physician to manage her hyperactive thyroid  and adjust her dose of levothyroxine. She may also consider participation in the CREAD dementia study if interested. [name] Contras, MD  Note: This document was prepared with digital dictation and possible smart phrase technology. Any transcriptional errors that result from this process are unintentional.

## 2015-03-02 ENCOUNTER — Other Ambulatory Visit: Payer: Self-pay

## 2015-05-10 ENCOUNTER — Other Ambulatory Visit: Payer: Self-pay | Admitting: Internal Medicine

## 2015-05-18 ENCOUNTER — Ambulatory Visit: Payer: Medicare Other | Admitting: Neurology

## 2015-05-25 ENCOUNTER — Ambulatory Visit (INDEPENDENT_AMBULATORY_CARE_PROVIDER_SITE_OTHER): Payer: Medicare Other | Admitting: Nurse Practitioner

## 2015-05-25 ENCOUNTER — Encounter: Payer: Self-pay | Admitting: Nurse Practitioner

## 2015-05-25 VITALS — BP 188/86 | HR 76 | Ht 64.0 in | Wt 137.2 lb

## 2015-05-25 DIAGNOSIS — I10 Essential (primary) hypertension: Secondary | ICD-10-CM | POA: Diagnosis not present

## 2015-05-25 DIAGNOSIS — I639 Cerebral infarction, unspecified: Secondary | ICD-10-CM

## 2015-05-25 NOTE — Progress Notes (Addendum)
[facility]  PATIENT: Shandon Hoda Silverstein DOB: 1932-11-06   REASON FOR VISIT: History of stroke, memory loss, mild cognitive impairment HISTORY FROM: Patient and daughter    HISTORY OF PRESENT ILLNESS HISTORY: PS:Ms Valdiviezo is a 81 year old pleasant lady who I had last seen me in May 2014 when she had had a stroke.She presented with with pancreatitis to [facility] and imaging demonstrated dilated common bile duct and gallbladder stone. Hospital course was complicated by oliguria, encephalopathy and infection. She developed respiratory failure and was transferred to [place]. She was noted having slurred speech and some right facial weakness. According to was called but she was not a TPA candidate as last known whether time was not clearly known. CT of the head was unremarkable but subsequently MRI scan demonstrated multiple small bilateral cortical and cerebellar acute as well as subacute infarcts. These were felt to be of embolic etiology but workup for source of embolism was unrewarding. TEE showed could not be completed due to patient having excessive secretions in the airway and being unable to swallow. Telemetry monitoring did not reveal cardiac arrhythmias. Transcranial Doppler studies and carotid Dopplers were unremarkable. 2-D echo showed ejection fraction of 80% with hypertrophic cardiomyopathy. Outpatient prolonged cardiac telemetry monitoring was done for 3 weeks I do not have the actual results but presumably this was normal. V She subsequently improved with therapy and did not keep follow-up with me. Since November of last year she has had a couple falls and she hit her back of her head contusion. Since then the family feels that she's had some memory difficulties. She fractured her shoulder during one of the falls and she still has pain. She feels her balance was off and she has to walk slowly to avoid falling. Her husband has Alzheimer's dementia and her family  is struggling to help him and the patient herself is concerned with having memory difficulties whether she may be getting the same. She was seen at [facility] on 08/13/14 where she had an MRI scan which showed encephalomalacia from an old stroke but no acute abnormality. I have reviewed the films personally. Patient denies any headaches any double vision or any focal extremity weakness or numbness. She has no known history of depression her prior memory problems. She states her memory difficulties and short-term memory problems. She still able to do most activities for herself but her daughters have started helping her much more. She has never been on treatment for depression though she tested mildly positive today on the geriatric depression scale. On Mini-Mental status exam she scored 25/30. She has not had such cognitive testing done in the past. Update 02/09/2015 : PSShe returns for follow-up after initial consultation 3 months ago. She is accompanied by her daughter. She states some memory and cognitive difficulties about unchanged. She continues to have mainly short-term memory difficulties. She lives at home with her husband who has Alzheimer's and she is a caregiver. She is not able to drive and depends on her daughter but is independent in most other activities of daily living. She was started on Wellbutrin by me at last admission but she did not tolerate it well and made her quite emotional and and she stopped it. She denies feeling depressed and does not want to try any medications for that. She had EEG done on 11/06/14 which I have reviewed and was normal. MRI scan of the brain done on 11/22/14 shows bilateral old lacunar infarcts without any acute finding. There  is mild changes of generalized cerebral atrophy and chronic small vessel disease. She fell unfortunately and fractured her right shoulder but this is been treated conservatively without surgery. TSH on 11/06/14 was 0.149 and her primary care  physician is managing that. Vitamin B12 level was normal at 1246. Homocystine was 15.0. RPR was nonreactive.  UPDATE 05/24/16 Ms. [place], 81 year old female returns for follow-up with her daughter. She was last seen by Dr. Leonie Man 6/6/ 2016. She has a history of stroke that occurred in 2014 and now is having some memory and cognitive difficulties. 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 She no longer drives due to visual problems but is independent in all other activities of daily living. She denies any depression. EEG done in March 2016 was normal. MRI of the brain March 2016 shows old lacunar infarcts without acute findings. There are mild changes of generalized cerebral atrophy and chronic small vessel disease. She and her daughter feel her memory is about the same. She returns for reevaluation   REVIEW OF SYSTEMS: Full 14 system review of systems performed and notable only for those listed, all others are neg:  Constitutional: neg  Cardiovascular: neg Ear/Nose/Throat: neg  Skin: neg Eyes: neg Respiratory: neg Gastroitestinal: neg  Hematology/Lymphatic: neg  Endocrine: neg Musculoskeletal:neg Allergy/Immunology: neg Neurological: neg Psychiatric: neg Sleep : neg   ALLERGIES: Allergies  Allergen Reactions  . Cefuroxime Axetil     REACTION: nausea She was able to tolerate Vantin fine  . Labetalol     Ortho BP drop  . Nitrofurantoin     REACTION: nausea  . Sulfadiazine Rash    HOME MEDICATIONS: Outpatient Prescriptions Prior to Visit  Medication Sig Dispense Refill  . amLODipine (NORVASC) 2.5 MG tablet TAKE 1 TABLET TWICE A DAY 60 tablet 5  . aspirin 325 MG tablet Take 1 tablet (325 mg total) by mouth daily.    . cholecalciferol (VITAMIN D) 1000 UNITS tablet Take 1,000 Units by mouth daily.      . cyanocobalamin (,VITAMIN B-12,) 1000 MCG/ML injection INJECT 1 ML EVERY 14 DAYS 30 mL 0  . folic acid (FOLVITE) A999333 MCG tablet Take 400 mcg by mouth daily.      Marland Kitchen gabapentin (NEURONTIN) 100 MG capsule TAKE 1 TO 2 CAPSULES EVERY DAY AT BEDTIME AS NEEDED FOR RESTLESS LEGS 90 capsule 0  . levothyroxine (SYNTHROID, LEVOTHROID) 88 MCG tablet Take 88 mcg by mouth daily before breakfast.    . LORazepam (ATIVAN) 0.5 MG tablet   1  . Omega-3 Fatty Acids ([employer] FISH OIL) 1000 MG CAPS Take 1 capsule by mouth 1 day or 1 dose. 90 capsule 1  . omeprazole (PRILOSEC OTC) 20 MG tablet Take 2 tablets (40 mg total) by mouth daily. 60 tablet 0  . polyethylene glycol (MIRALAX / GLYCOLAX) packet Take 17 g by mouth daily. 14 each 0  . triamcinolone ointment (KENALOG) 0.5 % Apply 1 application topically daily as needed.      No facility-administered medications prior to visit.    PAST MEDICAL HISTORY: Past Medical History  Diagnosis Date  . Hypertension   . Hyperlipidemia   . Palpitations   . Thyroid disease   . Weakness generalized   . Anemia   . Other urinary problems   . Lightheadedness   . Breast pain   . Vasovagal syncope   . Cellulitis and abscess of unspecified digit   . Urinary tract infection, site not specified   . Urgency of urination   .  Other symptoms involving abdomen and pelvis(789.9)   . Rash and other nonspecific skin eruption   . Unspecified venous (peripheral) insufficiency   . Edema   . Osteoarthrosis, unspecified whether generalized or localized, unspecified site   . Monoclonal paraproteinemia   . Irritable bowel syndrome   . Other B-complex deficiencies   . Unspecified hypothyroidism   . GERD (gastroesophageal reflux disease)   . Anxiety   . Torus palatinus   . Pancreatitis   . Complication of anesthesia   . PONV (postoperative nausea and vomiting)   . Dyspnea     w/ atypical upper airway symptoms? all vcd/lpr/gerd  . Pneumonia 01/2013  . Renal insufficiency     after dye test, have improved.  . Constipation   . Stroke 01/2013    right arm and hand weakness, memory loss  . Biliary acute pancreatitis 07/09/2013    Lap chole on  07/18/13   . Fall 08/2014    fx shoulder    PAST SURGICAL HISTORY: Past Surgical History  Procedure Laterality Date  . Back surgery    . Shoulder surgery    . Abdominal hysterectomy    . Vaginosacropexy    . Suprapubic bladder suspension    . Tee without cardioversion N/A 01/16/2013    Procedure: TRANSESOPHAGEAL ECHOCARDIOGRAM (TEE);  Surgeon: Josue Hector, MD;  Location: [place];  Service: Cardiovascular;  Laterality: N/A;  . Appendectomy    . Thyroiodectomy    . Eye surgery Bilateral 2014    cataract  . Cholecystectomy  07/18/2013    Dr Georgette Dover  . Cholecystectomy N/A 07/18/2013    Procedure: LAPAROSCOPIC CHOLECYSTECTOMY;  Surgeon: Imogene Burn. Georgette Dover, MD;  Location: [facility];  Service: General;  Laterality: N/A;    FAMILY HISTORY: Family History  Problem Relation Age of Onset  . Hypertension Other   . Hypertension Father   . Heart Problems Father   . Coronary artery disease Father   . Alzheimer's disease Sister     SOCIAL HISTORY: Social History   Social History  . Marital Status: Married    Spouse Name: N/A  . Number of Children: 3  . Years of Education: 12   Occupational History  . Not on file.   Social History Main Topics  . Smoking status: Never Smoker   . Smokeless tobacco: Never Used  . Alcohol Use: No     Comment: Occasionally during the night to help sleep  . Drug Use: No  . Sexual Activity: Not on file   Other Topics Concern  . Not on file   Social History Narrative   Patient is married with 3 children.   Patient has hs education.     PHYSICAL EXAM  Filed Vitals:   05/25/15 1142  BP: 188/86  Pulse: 76  Height: 5\' 4"  (1.626 m)  Weight: 137 lb 3.2 oz (62.234 kg)   Body mass index is 23.54 kg/(m^2). General: well developed, well nourished, seated, in no evident distress Head: head normocephalic and atraumatic.  Neck: supple with no carotid  bruits Cardiovascular: regular rate and rhythm, no murmurs Musculoskeletal: no  deformity Skin: no rash/petichiae Vascular: Normal pulses all extremities Neurological examination  Mental Status: Awake and fully alert. MMSE 24/30 last 25 /30. AFT 8.  Mood and affect appropriate.  Cranial Nerves: Fundoscopic exam not done . Pupils equal, briskly reactive to light. Extraocular movements full without nystagmus. Visual fields show partial left homonymous hemianopsia to confrontation testing. Hearing intact. Facial sensation intact. Face, tongue, palate  moves normally and symmetrically.  Motor: Normal bulk and tone. Normal strength in all tested extremity muscles. Sensory.: intact to touch , pinprick , position and vibratory sensation.  Coordination: Rapid alternating movements normal in all extremities. Finger-to-nose and heel-to-shin performed accurately bilaterally. Gait and Station: Arises from chair without difficulty. Stance is normal. Gait demonstrates normal stride length and balance . Able to heel, toe and tandem walk without difficulty. No assistive device Reflexes: 1+ and symmetric. Toes downgoing.    DIAGNOSTIC DATA (LABS, IMAGING, TESTING)   Lab Results  Component Value Date   [id number]* 11/06/2014   Lab Results  Component Value Date   TSH 0.149* 11/06/2014      ASSESSMENT AND PLAN  81 y.o. year old female  has a past medical history of Hypertension; Hyperlipidemia;  complaints of memory and cognitive difficulties likely from mild cognitive impairment. The neurological exam shows only mild cognitive impairment and old neurological deficits, prior strokes.The patient is a current patient of Dr. Leonie Man  who is out of the office today . This note is sent to the work in doctor.      Continue fish oil 1000 mg daily  Continue activities  which are mentally challenging like playing bridge, sudoku, crossword puzzles, solitaire etc.  Continue aspirin for secondary stroke prevention Will follow memory score over time Discussed CREED study but she is  not interested.  Dennie Bible, [facility], [facility], APRN  [facility] [address] [place], [place] [address] [phone]  Personally  participated in and made any corrections needed to history, physical, neuro exam,assessment and plan as stated above.  Sarina Ill, MD [facility]

## 2015-05-25 NOTE — Patient Instructions (Addendum)
Continue fish oil 1000 mg daily  Continue activities  which are mentally challenging like playing bridge, sudoku, crossword puzzles, solitaire etc.  Continue aspirin for secondary stroke prevention Will follow memory score over time

## 2015-06-16 ENCOUNTER — Other Ambulatory Visit: Payer: Self-pay | Admitting: Internal Medicine

## 2015-06-16 MED ORDER — AMLODIPINE BESYLATE 2.5 MG PO TABS: 2.5000 mg | ORAL_TABLET | Freq: Two times a day (BID) | ORAL | Status: DC

## 2015-07-27 ENCOUNTER — Other Ambulatory Visit: Payer: Self-pay | Admitting: Internal Medicine

## 2015-09-28 DIAGNOSIS — D472 Monoclonal gammopathy: Secondary | ICD-10-CM | POA: Diagnosis not present

## 2015-09-28 DIAGNOSIS — D631 Anemia in chronic kidney disease: Secondary | ICD-10-CM | POA: Diagnosis not present

## 2015-09-28 DIAGNOSIS — N189 Chronic kidney disease, unspecified: Secondary | ICD-10-CM | POA: Diagnosis not present

## 2015-10-19 DIAGNOSIS — N3090 Cystitis, unspecified without hematuria: Secondary | ICD-10-CM | POA: Diagnosis not present

## 2015-11-02 DIAGNOSIS — N3090 Cystitis, unspecified without hematuria: Secondary | ICD-10-CM | POA: Diagnosis not present

## 2015-11-02 DIAGNOSIS — N952 Postmenopausal atrophic vaginitis: Secondary | ICD-10-CM | POA: Diagnosis not present

## 2015-11-02 DIAGNOSIS — R339 Retention of urine, unspecified: Secondary | ICD-10-CM | POA: Diagnosis not present

## 2015-11-23 ENCOUNTER — Ambulatory Visit (INDEPENDENT_AMBULATORY_CARE_PROVIDER_SITE_OTHER): Payer: Medicare Other | Admitting: Nurse Practitioner

## 2015-11-23 ENCOUNTER — Encounter: Payer: Self-pay | Admitting: Nurse Practitioner

## 2015-11-23 VITALS — BP 189/75 | HR 80 | Ht 64.0 in | Wt 136.2 lb

## 2015-11-23 DIAGNOSIS — I639 Cerebral infarction, unspecified: Secondary | ICD-10-CM | POA: Diagnosis not present

## 2015-11-23 DIAGNOSIS — I10 Essential (primary) hypertension: Secondary | ICD-10-CM | POA: Diagnosis not present

## 2015-11-23 DIAGNOSIS — G3184 Mild cognitive impairment, so stated: Secondary | ICD-10-CM

## 2015-11-23 NOTE — Patient Instructions (Signed)
Continue fish oil 1000 mg daily  Continue activities which are mentally challenging like books on tape.  Continue aspirin for secondary stroke prevention Will follow memory score over time

## 2015-11-23 NOTE — Progress Notes (Signed)
[facility]  PATIENT: [name] [name] DOB: [birth year]   REASON FOR VISIT: Follow-up for stroke, mild cognitive impairment HISTORY FROM: Patient and daughter    HISTORY OF PRESENT ILLNESS: HISTORY: PS:[name] [name] is a 82 year old pleasant lady who I had last seen me in May 2014 when she had had a stroke.She presented with with pancreatitis to [facility] and imaging demonstrated dilated common bile duct and gallbladder stone. Hospital course was complicated by oliguria, encephalopathy and infection. She developed respiratory failure and was transferred to [facility]. She was noted having slurred speech and some right facial weakness. According to was called but she was not a TPA candidate as last known whether time was not clearly known. CT of the head was unremarkable but subsequently MRI scan demonstrated multiple small bilateral cortical and cerebellar acute as well as subacute infarcts. These were felt to be of embolic etiology but workup for source of embolism was unrewarding. TEE showed could not be completed due to patient having excessive secretions in the airway and being unable to swallow. Telemetry monitoring did not reveal cardiac arrhythmias. Transcranial Doppler studies and carotid Dopplers were unremarkable. 2-D echo showed ejection fraction of 80% with hypertrophic cardiomyopathy. Outpatient prolonged cardiac telemetry monitoring was done for 3 weeks I do not have the actual results but presumably this was normal. V She subsequently improved with therapy and did not keep follow-up with me. Since November of last year she has had a couple falls and she hit her back of her head contusion. Since then the family feels that she's had some memory difficulties. She fractured her shoulder during one of the falls and she still has pain. She feels her balance was off and she has to walk slowly to avoid falling. Her husband has Alzheimer's dementia and her family is  struggling to help him and the patient herself is concerned with having memory difficulties whether she may be getting the same. She was seen at [facility] on 08/13/14 where she had an MRI scan which showed encephalomalacia from an old stroke but no acute abnormality. I have reviewed the films personally. Patient denies any headaches any double vision or any focal extremity weakness or numbness. She has no known history of depression her prior memory problems. She states her memory difficulties and short-term memory problems. She still able to do most activities for herself but her daughters have started helping her much more. She has never been on treatment for depression though she tested mildly positive today on the geriatric depression scale. On Mini-Mental status exam she scored 25/30. She has not had such cognitive testing done in the past. Update 02/09/2015 : PSShe returns for follow-up after initial consultation 3 months ago. She is accompanied by her daughter. She states some memory and cognitive difficulties about unchanged. She continues to have mainly short-term memory difficulties. She lives at home with her husband who has Alzheimer's and she is a caregiver. She is not able to drive and depends on her daughter but is independent in most other activities of daily living. She was started on Wellbutrin by me at last admission but she did not tolerate it well and made her quite emotional and and she stopped it. She denies feeling depressed and does not want to try any medications for that. She had EEG done on 11/06/14 which I have reviewed and was normal. MRI scan of the brain done on 11/22/14 shows bilateral old lacunar infarcts without any acute finding. There is mild  changes of generalized cerebral atrophy and chronic small vessel disease. She fell unfortunately and fractured her right shoulder but this is been treated conservatively without surgery. TSH on 11/06/14 was 0.149 and her primary care  physician is managing that. Vitamin B12 level was normal at 1246. Homocystine was 15.0. RPR was nonreactive.  UPDATE 9/19/17CM [name]. [name] [name], 82 year old female returns for follow-up with her daughter. She was last seen by Dr. Leonie Man 6/6/ 2016. She has a history of stroke that occurred in 2014 and now is having some memory and cognitive difficulties. 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 She no longer drives due to visual problems but is independent in all other activities of daily living. She denies any depression. EEG done in March 2016 was normal. MRI of the brain March 2016 shows old lacunar infarcts without acute findings. There are mild changes of generalized cerebral atrophy and chronic small vessel disease. She and her daughter feel her memory is about the same. She returns for reevaluation  UPDATE 11/23/2015 CM [name] [name], 82 year old female returns for follow-up with her daughter. She has a history of stroke that occurred in 2014 and now some cognitive impairment. She continues to be caregiver to her husband who has Alzheimer's disease. She remains independent in all activities of daily living. She no longer drives due to visual problems. She feels her memory is about the same. She has not had further stroke or TIA symptoms. MRI of the brain March 2016 shows old lacunar infarcts without acute findings. There are mild changes of generalized cerebral atrophy and chronic small vessel disease. She returns for reevaluation  REVIEW OF SYSTEMS: Full 14 system review of systems performed and notable only for those listed, all others are neg:  Constitutional: neg  Cardiovascular: neg Ear/Nose/Throat: neg  Skin: neg Eyes: neg Respiratory: neg Gastroitestinal: neg  Hematology/Lymphatic: neg  Endocrine: neg Musculoskeletal:neg Allergy/Immunology: neg Neurological: Memory loss Psychiatric: Anxiety  Sleep : neg   ALLERGIES: Allergies  Allergen Reactions  . Cefuroxime Axetil      REACTION: nausea She was able to tolerate Vantin fine  . Labetalol     Ortho BP drop  . Nitrofurantoin     REACTION: nausea  . Sulfadiazine Rash    HOME MEDICATIONS: Outpatient Prescriptions Prior to Visit  Medication Sig Dispense Refill  . amLODipine (NORVASC) 2.5 MG tablet Take 1 tablet (2.5 mg total) by mouth 2 (two) times daily. 180 tablet 1  . aspirin 325 MG tablet Take 1 tablet (325 mg total) by mouth daily.    . cholecalciferol (VITAMIN D) 1000 UNITS tablet Take 1,000 Units by mouth daily.      . cyanocobalamin (,VITAMIN B-12,) 1000 MCG/ML injection INJECT 1 ML EVERY 14 DAYS 30 mL 0  . folic acid (FOLVITE) A999333 MCG tablet Take 400 mcg by mouth daily.    Marland Kitchen gabapentin (NEURONTIN) 100 MG capsule TAKE 1 TO 2 CAPSULES EVERY DAY AT BEDTIME AS NEEDED FOR RESTLESS LEGS 90 capsule 0  . levothyroxine (SYNTHROID, LEVOTHROID) 88 MCG tablet Take 88 mcg by mouth daily before breakfast.    . LORazepam (ATIVAN) 0.5 MG tablet   1  . Omega-3 Fatty Acids ([employer] FISH OIL) 1000 MG CAPS Take 1 capsule by mouth 1 day or 1 dose. 90 capsule 1  . omeprazole (PRILOSEC OTC) 20 MG tablet Take 2 tablets (40 mg total) by mouth daily. 60 tablet 0  . triamcinolone ointment (KENALOG) 0.5 % Apply 1 application topically daily as needed.     Marland Kitchen  polyethylene glycol (MIRALAX / GLYCOLAX) packet Take 17 g by mouth daily. 14 each 0   No facility-administered medications prior to visit.    PAST MEDICAL HISTORY: Past Medical History  Diagnosis Date  . Hypertension   . Hyperlipidemia   . Palpitations   . Thyroid disease   . Weakness generalized   . Anemia   . Other urinary problems   . Lightheadedness   . Breast pain   . Vasovagal syncope   . Cellulitis and abscess of unspecified digit   . Urinary tract infection, site not specified   . Urgency of urination   . Other symptoms involving abdomen and pelvis(789.9)   . Rash and other nonspecific skin eruption   . Unspecified venous (peripheral) insufficiency     . Edema   . Osteoarthrosis, unspecified whether generalized or localized, unspecified site   . Monoclonal paraproteinemia   . Irritable bowel syndrome   . Other B-complex deficiencies   . Unspecified hypothyroidism   . GERD (gastroesophageal reflux disease)   . Anxiety   . Torus palatinus   . Pancreatitis   . Complication of anesthesia   . PONV (postoperative nausea and vomiting)   . Dyspnea     w/ atypical upper airway symptoms? all vcd/lpr/gerd  . Pneumonia 01/2013  . Renal insufficiency     after dye test, have improved.  . Constipation   . Stroke ([place]) 01/2013    right arm and hand weakness, memory loss  . Biliary acute pancreatitis 07/09/2013    Lap chole on 07/18/13   . Fall 08/2014    fx shoulder    PAST SURGICAL HISTORY: Past Surgical History  Procedure Laterality Date  . Back surgery    . Shoulder surgery    . Abdominal hysterectomy    . Vaginosacropexy    . Suprapubic bladder suspension    . Tee without cardioversion N/A 01/16/2013    Procedure: TRANSESOPHAGEAL ECHOCARDIOGRAM (TEE);  Surgeon: Josue Hector, MD;  Location: [facility];  Service: Cardiovascular;  Laterality: N/A;  . Appendectomy    . Thyroiodectomy    . Eye surgery Bilateral 2014    cataract  . Cholecystectomy  07/18/2013    Dr Georgette Dover  . Cholecystectomy N/A 07/18/2013    Procedure: LAPAROSCOPIC CHOLECYSTECTOMY;  Surgeon: Imogene Burn. Georgette Dover, MD;  Location: [facility];  Service: General;  Laterality: N/A;    FAMILY HISTORY: Family History  Problem Relation Age of Onset  . Hypertension Other   . Hypertension Father   . Heart Problems Father   . Coronary artery disease Father   . Alzheimer's disease Sister     SOCIAL HISTORY: Social History   Social History  . Marital Status: Married    Spouse Name: N/A  . Number of Children: 3  . Years of Education: 12   Occupational History  . Not on file.   Social History Main Topics  . Smoking status: Never Smoker   . Smokeless tobacco: Never Used   . Alcohol Use: No     Comment: Occasionally during the night to help sleep  . Drug Use: No  . Sexual Activity: Not on file   Other Topics Concern  . Not on file   Social History Narrative   Patient is married with 3 children.   Patient has hs education.     PHYSICAL EXAM  Filed Vitals:   11/23/15 1109  BP: 189/75  Pulse: 80  Height: 5\' 4"  (1.626 m)  Weight: 136 lb 3.2 oz (  61.78 kg)   Body mass index is 23.37 kg/(m^2). General: well developed, well nourished, seated, in no evident distress Head: head normocephalic and atraumatic.  Neck: supple with no carotid bruits Cardiovascular: regular rate and rhythm, no murmurs Musculoskeletal: no deformity Skin: no rash/petichiae Vascular: Normal pulses all extremities Neurological examination  Mental Status: Awake and fully alert. MMSE 25/30 last 24 /30.. Mood and affect appropriate.  Cranial Nerves: Fundoscopic exam not done . Pupils equal, briskly reactive to light. Extraocular movements full without nystagmus. Visual fields show partial left homonymous hemianopsia to confrontation testing. Hearing intact. Facial sensation intact. Face, tongue, palate moves normally and symmetrically.  Motor: Normal bulk and tone. Normal strength in all tested extremity muscles. Sensory.: intact to touch , pinprick , position and vibratory sensation.  Coordination: Rapid alternating movements normal in all extremities. Finger-to-nose and heel-to-shin performed accurately bilaterally. Gait and Station: Arises from chair without difficulty. Stance is normal. Gait demonstrates normal stride length and balance . Able to heel, toe and tandem walk without difficulty. No assistive device Reflexes: 1+ and symmetric. Toes downgoing.   DIAGNOSTIC DATA (LABS, IMAGING, TESTING) -  Lab Results  Component Value Date   TSH 0.149* 11/06/2014      ASSESSMENT AND PLAN 82 y.o. year old female has a past medical history of Hypertension;  Hyperlipidemia; complaints of memory and cognitive difficulties likely from mild cognitive impairment. The neurological exam shows only mild cognitive impairment and old neurological deficits, prior strokes.The patient is a current patient of Dr. Leonie Man who is out of the office today . This note is sent to the work in doctor.    Continue fish oil 1000 mg daily  Continue activities which are mentally challenging like books on tape cross words etc.  Continue aspirin for secondary stroke prevention Will follow memory score over time Dennie Bible, [facility], [facility], [facility] [address], [place], [place] [address] [phone]

## 2015-11-24 DIAGNOSIS — N3090 Cystitis, unspecified without hematuria: Secondary | ICD-10-CM | POA: Diagnosis not present

## 2015-11-24 DIAGNOSIS — N390 Urinary tract infection, site not specified: Secondary | ICD-10-CM | POA: Diagnosis not present

## 2015-11-24 NOTE — Progress Notes (Signed)
I reviewed note and agree with plan.   Zackariah Vanderpol R. Hobert Poplaski, MD  Certified in Neurology, Neurophysiology and Neuroimaging  [facility] [address], [address] [place], [place] [address] [phone]   

## 2016-01-04 ENCOUNTER — Other Ambulatory Visit: Payer: Self-pay | Admitting: Internal Medicine

## 2016-01-18 DIAGNOSIS — N189 Chronic kidney disease, unspecified: Secondary | ICD-10-CM | POA: Diagnosis not present

## 2016-01-18 DIAGNOSIS — D631 Anemia in chronic kidney disease: Secondary | ICD-10-CM | POA: Diagnosis not present

## 2016-01-25 DIAGNOSIS — D472 Monoclonal gammopathy: Secondary | ICD-10-CM | POA: Diagnosis not present

## 2016-01-25 DIAGNOSIS — D631 Anemia in chronic kidney disease: Secondary | ICD-10-CM | POA: Diagnosis not present

## 2016-01-25 DIAGNOSIS — N189 Chronic kidney disease, unspecified: Secondary | ICD-10-CM | POA: Diagnosis not present

## 2016-01-26 DIAGNOSIS — N390 Urinary tract infection, site not specified: Secondary | ICD-10-CM | POA: Diagnosis not present

## 2016-01-26 DIAGNOSIS — N3090 Cystitis, unspecified without hematuria: Secondary | ICD-10-CM | POA: Diagnosis not present

## 2016-03-27 DIAGNOSIS — M545 Low back pain: Secondary | ICD-10-CM | POA: Diagnosis not present

## 2016-05-02 DIAGNOSIS — N3090 Cystitis, unspecified without hematuria: Secondary | ICD-10-CM | POA: Diagnosis not present

## 2016-05-02 DIAGNOSIS — N952 Postmenopausal atrophic vaginitis: Secondary | ICD-10-CM | POA: Diagnosis not present

## 2016-05-23 ENCOUNTER — Ambulatory Visit: Payer: Medicare Other | Admitting: Nurse Practitioner

## 2016-05-23 DIAGNOSIS — D631 Anemia in chronic kidney disease: Secondary | ICD-10-CM | POA: Diagnosis not present

## 2016-05-23 DIAGNOSIS — N189 Chronic kidney disease, unspecified: Secondary | ICD-10-CM | POA: Diagnosis not present

## 2016-05-24 ENCOUNTER — Encounter: Payer: Self-pay | Admitting: Nurse Practitioner

## 2016-05-30 DIAGNOSIS — D631 Anemia in chronic kidney disease: Secondary | ICD-10-CM | POA: Diagnosis not present

## 2016-05-30 DIAGNOSIS — B373 Candidiasis of vulva and vagina: Secondary | ICD-10-CM | POA: Diagnosis not present

## 2016-05-30 DIAGNOSIS — I10 Essential (primary) hypertension: Secondary | ICD-10-CM | POA: Diagnosis not present

## 2016-05-30 DIAGNOSIS — L84 Corns and callosities: Secondary | ICD-10-CM | POA: Diagnosis not present

## 2016-05-30 DIAGNOSIS — Z23 Encounter for immunization: Secondary | ICD-10-CM | POA: Diagnosis not present

## 2016-05-30 DIAGNOSIS — E038 Other specified hypothyroidism: Secondary | ICD-10-CM | POA: Diagnosis not present

## 2016-05-30 DIAGNOSIS — N189 Chronic kidney disease, unspecified: Secondary | ICD-10-CM | POA: Diagnosis not present

## 2016-06-06 DIAGNOSIS — N3090 Cystitis, unspecified without hematuria: Secondary | ICD-10-CM | POA: Diagnosis not present

## 2016-06-06 DIAGNOSIS — N390 Urinary tract infection, site not specified: Secondary | ICD-10-CM | POA: Diagnosis not present

## 2016-06-23 DIAGNOSIS — J069 Acute upper respiratory infection, unspecified: Secondary | ICD-10-CM | POA: Diagnosis not present

## 2016-06-23 DIAGNOSIS — J060 Acute laryngopharyngitis: Secondary | ICD-10-CM | POA: Diagnosis not present

## 2016-07-26 DIAGNOSIS — R944 Abnormal results of kidney function studies: Secondary | ICD-10-CM | POA: Diagnosis not present

## 2016-07-26 DIAGNOSIS — E038 Other specified hypothyroidism: Secondary | ICD-10-CM | POA: Diagnosis not present

## 2016-09-02 DIAGNOSIS — N3091 Cystitis, unspecified with hematuria: Secondary | ICD-10-CM | POA: Diagnosis not present

## 2016-09-03 DIAGNOSIS — E860 Dehydration: Secondary | ICD-10-CM | POA: Diagnosis not present

## 2016-09-03 DIAGNOSIS — A419 Sepsis, unspecified organism: Secondary | ICD-10-CM | POA: Diagnosis not present

## 2016-09-03 DIAGNOSIS — R6520 Severe sepsis without septic shock: Secondary | ICD-10-CM | POA: Diagnosis not present

## 2016-09-03 DIAGNOSIS — E538 Deficiency of other specified B group vitamins: Secondary | ICD-10-CM

## 2016-09-03 DIAGNOSIS — D649 Anemia, unspecified: Secondary | ICD-10-CM

## 2016-09-03 DIAGNOSIS — J690 Pneumonitis due to inhalation of food and vomit: Secondary | ICD-10-CM | POA: Diagnosis not present

## 2016-09-03 DIAGNOSIS — E039 Hypothyroidism, unspecified: Secondary | ICD-10-CM | POA: Diagnosis not present

## 2016-09-03 DIAGNOSIS — K219 Gastro-esophageal reflux disease without esophagitis: Secondary | ICD-10-CM | POA: Diagnosis not present

## 2016-09-03 DIAGNOSIS — D631 Anemia in chronic kidney disease: Secondary | ICD-10-CM | POA: Diagnosis not present

## 2016-09-03 DIAGNOSIS — I6930 Unspecified sequelae of cerebral infarction: Secondary | ICD-10-CM | POA: Diagnosis not present

## 2016-09-03 DIAGNOSIS — G2581 Restless legs syndrome: Secondary | ICD-10-CM

## 2016-09-03 DIAGNOSIS — R112 Nausea with vomiting, unspecified: Secondary | ICD-10-CM | POA: Diagnosis not present

## 2016-09-03 DIAGNOSIS — R918 Other nonspecific abnormal finding of lung field: Secondary | ICD-10-CM | POA: Diagnosis not present

## 2016-09-03 DIAGNOSIS — J9600 Acute respiratory failure, unspecified whether with hypoxia or hypercapnia: Secondary | ICD-10-CM | POA: Diagnosis not present

## 2016-09-03 DIAGNOSIS — J9601 Acute respiratory failure with hypoxia: Secondary | ICD-10-CM | POA: Diagnosis not present

## 2016-09-03 DIAGNOSIS — E559 Vitamin D deficiency, unspecified: Secondary | ICD-10-CM

## 2016-09-03 DIAGNOSIS — R42 Dizziness and giddiness: Secondary | ICD-10-CM | POA: Diagnosis not present

## 2016-09-03 DIAGNOSIS — J181 Lobar pneumonia, unspecified organism: Secondary | ICD-10-CM | POA: Diagnosis not present

## 2016-09-03 DIAGNOSIS — R404 Transient alteration of awareness: Secondary | ICD-10-CM | POA: Diagnosis not present

## 2016-09-03 DIAGNOSIS — D519 Vitamin B12 deficiency anemia, unspecified: Secondary | ICD-10-CM | POA: Diagnosis not present

## 2016-09-03 DIAGNOSIS — R55 Syncope and collapse: Secondary | ICD-10-CM | POA: Diagnosis not present

## 2016-09-03 DIAGNOSIS — N183 Chronic kidney disease, stage 3 (moderate): Secondary | ICD-10-CM | POA: Diagnosis not present

## 2016-09-04 DIAGNOSIS — R55 Syncope and collapse: Secondary | ICD-10-CM | POA: Diagnosis not present

## 2016-09-04 DIAGNOSIS — R918 Other nonspecific abnormal finding of lung field: Secondary | ICD-10-CM | POA: Diagnosis not present

## 2016-09-04 DIAGNOSIS — N183 Chronic kidney disease, stage 3 (moderate): Secondary | ICD-10-CM | POA: Diagnosis not present

## 2016-09-04 DIAGNOSIS — J9600 Acute respiratory failure, unspecified whether with hypoxia or hypercapnia: Secondary | ICD-10-CM | POA: Diagnosis not present

## 2016-09-04 DIAGNOSIS — J690 Pneumonitis due to inhalation of food and vomit: Secondary | ICD-10-CM | POA: Diagnosis not present

## 2016-09-04 DIAGNOSIS — I6930 Unspecified sequelae of cerebral infarction: Secondary | ICD-10-CM | POA: Diagnosis not present

## 2016-09-04 DIAGNOSIS — A419 Sepsis, unspecified organism: Secondary | ICD-10-CM | POA: Diagnosis not present

## 2016-09-05 DIAGNOSIS — A419 Sepsis, unspecified organism: Secondary | ICD-10-CM | POA: Diagnosis not present

## 2016-09-05 DIAGNOSIS — R55 Syncope and collapse: Secondary | ICD-10-CM | POA: Diagnosis not present

## 2016-09-05 DIAGNOSIS — I6930 Unspecified sequelae of cerebral infarction: Secondary | ICD-10-CM | POA: Diagnosis not present

## 2016-09-05 DIAGNOSIS — N183 Chronic kidney disease, stage 3 (moderate): Secondary | ICD-10-CM | POA: Diagnosis not present

## 2016-09-07 DIAGNOSIS — A403 Sepsis due to Streptococcus pneumoniae: Secondary | ICD-10-CM | POA: Diagnosis not present

## 2016-09-07 DIAGNOSIS — J158 Pneumonia due to other specified bacteria: Secondary | ICD-10-CM | POA: Diagnosis not present

## 2016-09-07 DIAGNOSIS — A419 Sepsis, unspecified organism: Secondary | ICD-10-CM | POA: Diagnosis not present

## 2016-09-07 DIAGNOSIS — D631 Anemia in chronic kidney disease: Secondary | ICD-10-CM | POA: Diagnosis not present

## 2016-09-12 DIAGNOSIS — J158 Pneumonia due to other specified bacteria: Secondary | ICD-10-CM | POA: Diagnosis not present

## 2016-09-12 DIAGNOSIS — J918 Pleural effusion in other conditions classified elsewhere: Secondary | ICD-10-CM | POA: Diagnosis not present

## 2016-09-12 DIAGNOSIS — D631 Anemia in chronic kidney disease: Secondary | ICD-10-CM | POA: Diagnosis not present

## 2016-09-12 DIAGNOSIS — J189 Pneumonia, unspecified organism: Secondary | ICD-10-CM | POA: Diagnosis not present

## 2016-09-12 DIAGNOSIS — R0602 Shortness of breath: Secondary | ICD-10-CM | POA: Diagnosis not present

## 2016-10-03 DIAGNOSIS — I8310 Varicose veins of unspecified lower extremity with inflammation: Secondary | ICD-10-CM | POA: Diagnosis not present

## 2016-10-03 DIAGNOSIS — C44722 Squamous cell carcinoma of skin of right lower limb, including hip: Secondary | ICD-10-CM | POA: Diagnosis not present

## 2016-10-03 DIAGNOSIS — R600 Localized edema: Secondary | ICD-10-CM | POA: Diagnosis not present

## 2016-10-07 DIAGNOSIS — I10 Essential (primary) hypertension: Secondary | ICD-10-CM | POA: Diagnosis not present

## 2016-10-07 DIAGNOSIS — L03115 Cellulitis of right lower limb: Secondary | ICD-10-CM | POA: Diagnosis not present

## 2016-10-07 DIAGNOSIS — R600 Localized edema: Secondary | ICD-10-CM | POA: Diagnosis not present

## 2016-10-07 DIAGNOSIS — L308 Other specified dermatitis: Secondary | ICD-10-CM | POA: Diagnosis not present

## 2016-10-17 DIAGNOSIS — D1801 Hemangioma of skin and subcutaneous tissue: Secondary | ICD-10-CM | POA: Diagnosis not present

## 2016-10-17 DIAGNOSIS — L570 Actinic keratosis: Secondary | ICD-10-CM | POA: Diagnosis not present

## 2016-10-22 DIAGNOSIS — G589 Mononeuropathy, unspecified: Secondary | ICD-10-CM | POA: Diagnosis not present

## 2016-10-31 DIAGNOSIS — I10 Essential (primary) hypertension: Secondary | ICD-10-CM | POA: Diagnosis not present

## 2016-10-31 DIAGNOSIS — R202 Paresthesia of skin: Secondary | ICD-10-CM | POA: Diagnosis not present

## 2016-10-31 DIAGNOSIS — E538 Deficiency of other specified B group vitamins: Secondary | ICD-10-CM | POA: Diagnosis not present

## 2016-10-31 DIAGNOSIS — G603 Idiopathic progressive neuropathy: Secondary | ICD-10-CM | POA: Diagnosis not present

## 2016-11-07 DIAGNOSIS — N3090 Cystitis, unspecified without hematuria: Secondary | ICD-10-CM | POA: Diagnosis not present

## 2016-11-07 DIAGNOSIS — N952 Postmenopausal atrophic vaginitis: Secondary | ICD-10-CM | POA: Diagnosis not present

## 2016-11-07 DIAGNOSIS — N289 Disorder of kidney and ureter, unspecified: Secondary | ICD-10-CM | POA: Diagnosis not present

## 2016-11-21 DIAGNOSIS — N289 Disorder of kidney and ureter, unspecified: Secondary | ICD-10-CM | POA: Diagnosis not present

## 2016-11-21 DIAGNOSIS — N2889 Other specified disorders of kidney and ureter: Secondary | ICD-10-CM | POA: Diagnosis not present

## 2016-11-21 DIAGNOSIS — R7989 Other specified abnormal findings of blood chemistry: Secondary | ICD-10-CM | POA: Diagnosis not present

## 2016-11-28 DIAGNOSIS — G603 Idiopathic progressive neuropathy: Secondary | ICD-10-CM | POA: Diagnosis not present

## 2016-11-28 DIAGNOSIS — R202 Paresthesia of skin: Secondary | ICD-10-CM | POA: Diagnosis not present

## 2016-12-05 DIAGNOSIS — R2689 Other abnormalities of gait and mobility: Secondary | ICD-10-CM | POA: Diagnosis not present

## 2016-12-05 DIAGNOSIS — I129 Hypertensive chronic kidney disease with stage 1 through stage 4 chronic kidney disease, or unspecified chronic kidney disease: Secondary | ICD-10-CM | POA: Diagnosis not present

## 2016-12-05 DIAGNOSIS — G603 Idiopathic progressive neuropathy: Secondary | ICD-10-CM | POA: Diagnosis not present

## 2016-12-05 DIAGNOSIS — M6281 Muscle weakness (generalized): Secondary | ICD-10-CM | POA: Diagnosis not present

## 2016-12-05 DIAGNOSIS — Z8673 Personal history of transient ischemic attack (TIA), and cerebral infarction without residual deficits: Secondary | ICD-10-CM | POA: Diagnosis not present

## 2016-12-05 DIAGNOSIS — D631 Anemia in chronic kidney disease: Secondary | ICD-10-CM | POA: Diagnosis not present

## 2016-12-05 DIAGNOSIS — Z9181 History of falling: Secondary | ICD-10-CM | POA: Diagnosis not present

## 2016-12-05 DIAGNOSIS — N189 Chronic kidney disease, unspecified: Secondary | ICD-10-CM | POA: Diagnosis not present

## 2016-12-05 DIAGNOSIS — Z7982 Long term (current) use of aspirin: Secondary | ICD-10-CM | POA: Diagnosis not present

## 2016-12-05 DIAGNOSIS — R202 Paresthesia of skin: Secondary | ICD-10-CM | POA: Diagnosis not present

## 2016-12-08 DIAGNOSIS — D631 Anemia in chronic kidney disease: Secondary | ICD-10-CM | POA: Diagnosis not present

## 2016-12-08 DIAGNOSIS — D472 Monoclonal gammopathy: Secondary | ICD-10-CM | POA: Diagnosis not present

## 2016-12-08 DIAGNOSIS — N189 Chronic kidney disease, unspecified: Secondary | ICD-10-CM | POA: Diagnosis not present

## 2016-12-19 DIAGNOSIS — M6281 Muscle weakness (generalized): Secondary | ICD-10-CM | POA: Diagnosis not present

## 2016-12-19 DIAGNOSIS — R202 Paresthesia of skin: Secondary | ICD-10-CM | POA: Diagnosis not present

## 2016-12-19 DIAGNOSIS — G603 Idiopathic progressive neuropathy: Secondary | ICD-10-CM | POA: Diagnosis not present

## 2016-12-19 DIAGNOSIS — R2689 Other abnormalities of gait and mobility: Secondary | ICD-10-CM | POA: Diagnosis not present

## 2016-12-20 DIAGNOSIS — I129 Hypertensive chronic kidney disease with stage 1 through stage 4 chronic kidney disease, or unspecified chronic kidney disease: Secondary | ICD-10-CM | POA: Diagnosis not present

## 2016-12-20 DIAGNOSIS — I10 Essential (primary) hypertension: Secondary | ICD-10-CM | POA: Diagnosis not present

## 2016-12-20 DIAGNOSIS — N952 Postmenopausal atrophic vaginitis: Secondary | ICD-10-CM | POA: Diagnosis not present

## 2016-12-20 DIAGNOSIS — N184 Chronic kidney disease, stage 4 (severe): Secondary | ICD-10-CM | POA: Diagnosis not present

## 2017-01-02 DIAGNOSIS — I10 Essential (primary) hypertension: Secondary | ICD-10-CM | POA: Diagnosis not present

## 2017-01-02 DIAGNOSIS — D518 Other vitamin B12 deficiency anemias: Secondary | ICD-10-CM | POA: Diagnosis not present

## 2017-01-23 DIAGNOSIS — W5503XA Scratched by cat, initial encounter: Secondary | ICD-10-CM | POA: Diagnosis not present

## 2017-01-23 DIAGNOSIS — J301 Allergic rhinitis due to pollen: Secondary | ICD-10-CM | POA: Diagnosis not present

## 2017-01-23 DIAGNOSIS — H68011 Acute Eustachian salpingitis, right ear: Secondary | ICD-10-CM | POA: Diagnosis not present

## 2017-01-25 DIAGNOSIS — N3020 Other chronic cystitis without hematuria: Secondary | ICD-10-CM | POA: Diagnosis not present

## 2017-02-09 DIAGNOSIS — Z48812 Encounter for surgical aftercare following surgery on the circulatory system: Secondary | ICD-10-CM | POA: Diagnosis not present

## 2017-02-09 DIAGNOSIS — F419 Anxiety disorder, unspecified: Secondary | ICD-10-CM | POA: Diagnosis not present

## 2017-02-09 DIAGNOSIS — I119 Hypertensive heart disease without heart failure: Secondary | ICD-10-CM | POA: Diagnosis not present

## 2017-02-09 DIAGNOSIS — I2109 ST elevation (STEMI) myocardial infarction involving other coronary artery of anterior wall: Secondary | ICD-10-CM | POA: Diagnosis not present

## 2017-02-09 DIAGNOSIS — I2101 ST elevation (STEMI) myocardial infarction involving left main coronary artery: Secondary | ICD-10-CM | POA: Diagnosis not present

## 2017-02-09 DIAGNOSIS — I1310 Hypertensive heart and chronic kidney disease without heart failure, with stage 1 through stage 4 chronic kidney disease, or unspecified chronic kidney disease: Secondary | ICD-10-CM | POA: Diagnosis not present

## 2017-02-09 DIAGNOSIS — Y840 Cardiac catheterization as the cause of abnormal reaction of the patient, or of later complication, without mention of misadventure at the time of the procedure: Secondary | ICD-10-CM | POA: Diagnosis not present

## 2017-02-09 DIAGNOSIS — I501 Left ventricular failure: Secondary | ICD-10-CM | POA: Diagnosis not present

## 2017-02-09 DIAGNOSIS — R079 Chest pain, unspecified: Secondary | ICD-10-CM | POA: Diagnosis not present

## 2017-02-09 DIAGNOSIS — N179 Acute kidney failure, unspecified: Secondary | ICD-10-CM | POA: Diagnosis not present

## 2017-02-09 DIAGNOSIS — R509 Fever, unspecified: Secondary | ICD-10-CM | POA: Diagnosis not present

## 2017-02-09 DIAGNOSIS — N189 Chronic kidney disease, unspecified: Secondary | ICD-10-CM | POA: Diagnosis not present

## 2017-02-09 DIAGNOSIS — R001 Bradycardia, unspecified: Secondary | ICD-10-CM | POA: Diagnosis not present

## 2017-02-09 DIAGNOSIS — I2102 ST elevation (STEMI) myocardial infarction involving left anterior descending coronary artery: Secondary | ICD-10-CM | POA: Diagnosis not present

## 2017-02-09 DIAGNOSIS — N289 Disorder of kidney and ureter, unspecified: Secondary | ICD-10-CM | POA: Diagnosis not present

## 2017-02-09 DIAGNOSIS — I209 Angina pectoris, unspecified: Secondary | ICD-10-CM | POA: Diagnosis not present

## 2017-02-09 DIAGNOSIS — Z8673 Personal history of transient ischemic attack (TIA), and cerebral infarction without residual deficits: Secondary | ICD-10-CM | POA: Diagnosis not present

## 2017-02-09 DIAGNOSIS — I517 Cardiomegaly: Secondary | ICD-10-CM | POA: Diagnosis not present

## 2017-02-09 DIAGNOSIS — N183 Chronic kidney disease, stage 3 (moderate): Secondary | ICD-10-CM | POA: Diagnosis not present

## 2017-02-09 DIAGNOSIS — Z7982 Long term (current) use of aspirin: Secondary | ICD-10-CM | POA: Diagnosis not present

## 2017-02-09 DIAGNOSIS — D500 Iron deficiency anemia secondary to blood loss (chronic): Secondary | ICD-10-CM | POA: Diagnosis not present

## 2017-02-09 DIAGNOSIS — I447 Left bundle-branch block, unspecified: Secondary | ICD-10-CM | POA: Diagnosis not present

## 2017-02-09 DIAGNOSIS — I25118 Atherosclerotic heart disease of native coronary artery with other forms of angina pectoris: Secondary | ICD-10-CM | POA: Diagnosis not present

## 2017-02-09 DIAGNOSIS — I25110 Atherosclerotic heart disease of native coronary artery with unstable angina pectoris: Secondary | ICD-10-CM | POA: Diagnosis not present

## 2017-02-09 DIAGNOSIS — I219 Acute myocardial infarction, unspecified: Secondary | ICD-10-CM | POA: Diagnosis not present

## 2017-02-09 DIAGNOSIS — E784 Other hyperlipidemia: Secondary | ICD-10-CM | POA: Diagnosis not present

## 2017-02-09 DIAGNOSIS — R5383 Other fatigue: Secondary | ICD-10-CM | POA: Diagnosis not present

## 2017-02-09 DIAGNOSIS — M79606 Pain in leg, unspecified: Secondary | ICD-10-CM | POA: Diagnosis not present

## 2017-02-09 DIAGNOSIS — D649 Anemia, unspecified: Secondary | ICD-10-CM | POA: Diagnosis not present

## 2017-02-09 DIAGNOSIS — R0602 Shortness of breath: Secondary | ICD-10-CM | POA: Diagnosis not present

## 2017-02-09 DIAGNOSIS — I639 Cerebral infarction, unspecified: Secondary | ICD-10-CM | POA: Diagnosis not present

## 2017-02-09 DIAGNOSIS — F329 Major depressive disorder, single episode, unspecified: Secondary | ICD-10-CM | POA: Diagnosis not present

## 2017-02-09 DIAGNOSIS — E039 Hypothyroidism, unspecified: Secondary | ICD-10-CM | POA: Diagnosis not present

## 2017-02-09 DIAGNOSIS — I4581 Long QT syndrome: Secondary | ICD-10-CM | POA: Diagnosis not present

## 2017-02-09 DIAGNOSIS — I129 Hypertensive chronic kidney disease with stage 1 through stage 4 chronic kidney disease, or unspecified chronic kidney disease: Secondary | ICD-10-CM | POA: Diagnosis not present

## 2017-02-09 DIAGNOSIS — E7800 Pure hypercholesterolemia, unspecified: Secondary | ICD-10-CM | POA: Diagnosis not present

## 2017-02-10 DIAGNOSIS — I4581 Long QT syndrome: Secondary | ICD-10-CM | POA: Diagnosis not present

## 2017-02-10 DIAGNOSIS — I2109 ST elevation (STEMI) myocardial infarction involving other coronary artery of anterior wall: Secondary | ICD-10-CM | POA: Diagnosis not present

## 2017-02-10 DIAGNOSIS — I119 Hypertensive heart disease without heart failure: Secondary | ICD-10-CM | POA: Diagnosis not present

## 2017-02-10 DIAGNOSIS — I639 Cerebral infarction, unspecified: Secondary | ICD-10-CM | POA: Diagnosis not present

## 2017-02-10 DIAGNOSIS — I517 Cardiomegaly: Secondary | ICD-10-CM | POA: Diagnosis not present

## 2017-02-10 DIAGNOSIS — Z8673 Personal history of transient ischemic attack (TIA), and cerebral infarction without residual deficits: Secondary | ICD-10-CM | POA: Diagnosis not present

## 2017-02-10 DIAGNOSIS — E784 Other hyperlipidemia: Secondary | ICD-10-CM | POA: Diagnosis not present

## 2017-02-11 DIAGNOSIS — N189 Chronic kidney disease, unspecified: Secondary | ICD-10-CM | POA: Diagnosis not present

## 2017-02-11 DIAGNOSIS — I2102 ST elevation (STEMI) myocardial infarction involving left anterior descending coronary artery: Secondary | ICD-10-CM | POA: Diagnosis not present

## 2017-02-11 DIAGNOSIS — I1310 Hypertensive heart and chronic kidney disease without heart failure, with stage 1 through stage 4 chronic kidney disease, or unspecified chronic kidney disease: Secondary | ICD-10-CM | POA: Diagnosis not present

## 2017-02-11 DIAGNOSIS — I25118 Atherosclerotic heart disease of native coronary artery with other forms of angina pectoris: Secondary | ICD-10-CM | POA: Diagnosis not present

## 2017-02-11 DIAGNOSIS — R001 Bradycardia, unspecified: Secondary | ICD-10-CM | POA: Diagnosis not present

## 2017-02-12 DIAGNOSIS — I1310 Hypertensive heart and chronic kidney disease without heart failure, with stage 1 through stage 4 chronic kidney disease, or unspecified chronic kidney disease: Secondary | ICD-10-CM | POA: Diagnosis not present

## 2017-02-12 DIAGNOSIS — I4581 Long QT syndrome: Secondary | ICD-10-CM | POA: Diagnosis not present

## 2017-02-12 DIAGNOSIS — R509 Fever, unspecified: Secondary | ICD-10-CM | POA: Diagnosis not present

## 2017-02-12 DIAGNOSIS — I25118 Atherosclerotic heart disease of native coronary artery with other forms of angina pectoris: Secondary | ICD-10-CM | POA: Diagnosis not present

## 2017-02-12 DIAGNOSIS — I2102 ST elevation (STEMI) myocardial infarction involving left anterior descending coronary artery: Secondary | ICD-10-CM | POA: Diagnosis not present

## 2017-02-12 DIAGNOSIS — N189 Chronic kidney disease, unspecified: Secondary | ICD-10-CM | POA: Diagnosis not present

## 2017-02-13 DIAGNOSIS — I1310 Hypertensive heart and chronic kidney disease without heart failure, with stage 1 through stage 4 chronic kidney disease, or unspecified chronic kidney disease: Secondary | ICD-10-CM | POA: Diagnosis not present

## 2017-02-13 DIAGNOSIS — I2102 ST elevation (STEMI) myocardial infarction involving left anterior descending coronary artery: Secondary | ICD-10-CM | POA: Diagnosis not present

## 2017-02-13 DIAGNOSIS — I25118 Atherosclerotic heart disease of native coronary artery with other forms of angina pectoris: Secondary | ICD-10-CM | POA: Diagnosis not present

## 2017-02-13 DIAGNOSIS — I4581 Long QT syndrome: Secondary | ICD-10-CM | POA: Diagnosis not present

## 2017-02-13 DIAGNOSIS — N189 Chronic kidney disease, unspecified: Secondary | ICD-10-CM | POA: Diagnosis not present

## 2017-02-14 DIAGNOSIS — I25118 Atherosclerotic heart disease of native coronary artery with other forms of angina pectoris: Secondary | ICD-10-CM | POA: Diagnosis not present

## 2017-02-14 DIAGNOSIS — I4581 Long QT syndrome: Secondary | ICD-10-CM | POA: Diagnosis not present

## 2017-02-14 DIAGNOSIS — I1310 Hypertensive heart and chronic kidney disease without heart failure, with stage 1 through stage 4 chronic kidney disease, or unspecified chronic kidney disease: Secondary | ICD-10-CM | POA: Diagnosis not present

## 2017-02-14 DIAGNOSIS — I447 Left bundle-branch block, unspecified: Secondary | ICD-10-CM | POA: Diagnosis not present

## 2017-02-14 DIAGNOSIS — I2102 ST elevation (STEMI) myocardial infarction involving left anterior descending coronary artery: Secondary | ICD-10-CM | POA: Diagnosis not present

## 2017-02-14 DIAGNOSIS — N189 Chronic kidney disease, unspecified: Secondary | ICD-10-CM | POA: Diagnosis not present

## 2017-02-15 DIAGNOSIS — I119 Hypertensive heart disease without heart failure: Secondary | ICD-10-CM | POA: Diagnosis not present

## 2017-02-15 DIAGNOSIS — R001 Bradycardia, unspecified: Secondary | ICD-10-CM | POA: Diagnosis not present

## 2017-02-15 DIAGNOSIS — I2109 ST elevation (STEMI) myocardial infarction involving other coronary artery of anterior wall: Secondary | ICD-10-CM | POA: Diagnosis not present

## 2017-02-15 DIAGNOSIS — E784 Other hyperlipidemia: Secondary | ICD-10-CM | POA: Diagnosis not present

## 2017-02-15 DIAGNOSIS — I4581 Long QT syndrome: Secondary | ICD-10-CM | POA: Diagnosis not present

## 2017-02-15 DIAGNOSIS — N189 Chronic kidney disease, unspecified: Secondary | ICD-10-CM | POA: Diagnosis not present

## 2017-02-15 DIAGNOSIS — I25118 Atherosclerotic heart disease of native coronary artery with other forms of angina pectoris: Secondary | ICD-10-CM | POA: Diagnosis not present

## 2017-02-16 DIAGNOSIS — R001 Bradycardia, unspecified: Secondary | ICD-10-CM | POA: Diagnosis not present

## 2017-02-16 DIAGNOSIS — I4581 Long QT syndrome: Secondary | ICD-10-CM | POA: Diagnosis not present

## 2017-02-16 DIAGNOSIS — I1310 Hypertensive heart and chronic kidney disease without heart failure, with stage 1 through stage 4 chronic kidney disease, or unspecified chronic kidney disease: Secondary | ICD-10-CM | POA: Diagnosis not present

## 2017-02-16 DIAGNOSIS — N189 Chronic kidney disease, unspecified: Secondary | ICD-10-CM | POA: Diagnosis not present

## 2017-02-16 DIAGNOSIS — I25118 Atherosclerotic heart disease of native coronary artery with other forms of angina pectoris: Secondary | ICD-10-CM | POA: Diagnosis not present

## 2017-02-16 DIAGNOSIS — I2102 ST elevation (STEMI) myocardial infarction involving left anterior descending coronary artery: Secondary | ICD-10-CM | POA: Diagnosis not present

## 2017-02-17 DIAGNOSIS — I2102 ST elevation (STEMI) myocardial infarction involving left anterior descending coronary artery: Secondary | ICD-10-CM | POA: Diagnosis not present

## 2017-02-17 DIAGNOSIS — I25118 Atherosclerotic heart disease of native coronary artery with other forms of angina pectoris: Secondary | ICD-10-CM | POA: Diagnosis not present

## 2017-02-17 DIAGNOSIS — I2109 ST elevation (STEMI) myocardial infarction involving other coronary artery of anterior wall: Secondary | ICD-10-CM | POA: Diagnosis not present

## 2017-02-17 DIAGNOSIS — N289 Disorder of kidney and ureter, unspecified: Secondary | ICD-10-CM | POA: Diagnosis not present

## 2017-02-17 DIAGNOSIS — N189 Chronic kidney disease, unspecified: Secondary | ICD-10-CM | POA: Diagnosis not present

## 2017-02-17 DIAGNOSIS — M79606 Pain in leg, unspecified: Secondary | ICD-10-CM | POA: Diagnosis not present

## 2017-02-18 DIAGNOSIS — I25118 Atherosclerotic heart disease of native coronary artery with other forms of angina pectoris: Secondary | ICD-10-CM | POA: Diagnosis not present

## 2017-02-18 DIAGNOSIS — Z48812 Encounter for surgical aftercare following surgery on the circulatory system: Secondary | ICD-10-CM | POA: Diagnosis not present

## 2017-02-18 DIAGNOSIS — I501 Left ventricular failure: Secondary | ICD-10-CM | POA: Diagnosis not present

## 2017-02-18 DIAGNOSIS — N289 Disorder of kidney and ureter, unspecified: Secondary | ICD-10-CM | POA: Diagnosis not present

## 2017-02-18 DIAGNOSIS — R001 Bradycardia, unspecified: Secondary | ICD-10-CM | POA: Diagnosis not present

## 2017-02-18 DIAGNOSIS — N189 Chronic kidney disease, unspecified: Secondary | ICD-10-CM | POA: Diagnosis not present

## 2017-02-18 DIAGNOSIS — D500 Iron deficiency anemia secondary to blood loss (chronic): Secondary | ICD-10-CM | POA: Diagnosis not present

## 2017-02-18 DIAGNOSIS — I2102 ST elevation (STEMI) myocardial infarction involving left anterior descending coronary artery: Secondary | ICD-10-CM | POA: Diagnosis not present

## 2017-02-18 DIAGNOSIS — Y840 Cardiac catheterization as the cause of abnormal reaction of the patient, or of later complication, without mention of misadventure at the time of the procedure: Secondary | ICD-10-CM | POA: Diagnosis not present

## 2017-02-18 DIAGNOSIS — I2101 ST elevation (STEMI) myocardial infarction involving left main coronary artery: Secondary | ICD-10-CM | POA: Diagnosis not present

## 2017-03-01 DIAGNOSIS — I25110 Atherosclerotic heart disease of native coronary artery with unstable angina pectoris: Secondary | ICD-10-CM | POA: Diagnosis not present

## 2017-03-01 DIAGNOSIS — Z9181 History of falling: Secondary | ICD-10-CM | POA: Diagnosis not present

## 2017-03-01 DIAGNOSIS — D631 Anemia in chronic kidney disease: Secondary | ICD-10-CM | POA: Diagnosis not present

## 2017-03-01 DIAGNOSIS — I119 Hypertensive heart disease without heart failure: Secondary | ICD-10-CM | POA: Diagnosis not present

## 2017-03-01 DIAGNOSIS — I2102 ST elevation (STEMI) myocardial infarction involving left anterior descending coronary artery: Secondary | ICD-10-CM | POA: Diagnosis not present

## 2017-03-01 DIAGNOSIS — Z8673 Personal history of transient ischemic attack (TIA), and cerebral infarction without residual deficits: Secondary | ICD-10-CM | POA: Diagnosis not present

## 2017-03-01 DIAGNOSIS — Z48812 Encounter for surgical aftercare following surgery on the circulatory system: Secondary | ICD-10-CM | POA: Diagnosis not present

## 2017-03-01 DIAGNOSIS — I129 Hypertensive chronic kidney disease with stage 1 through stage 4 chronic kidney disease, or unspecified chronic kidney disease: Secondary | ICD-10-CM | POA: Diagnosis not present

## 2017-03-01 DIAGNOSIS — G603 Idiopathic progressive neuropathy: Secondary | ICD-10-CM | POA: Diagnosis not present

## 2017-03-01 DIAGNOSIS — N189 Chronic kidney disease, unspecified: Secondary | ICD-10-CM | POA: Diagnosis not present

## 2017-03-10 DIAGNOSIS — I2109 ST elevation (STEMI) myocardial infarction involving other coronary artery of anterior wall: Secondary | ICD-10-CM | POA: Diagnosis not present

## 2017-03-10 DIAGNOSIS — I10 Essential (primary) hypertension: Secondary | ICD-10-CM | POA: Diagnosis not present

## 2017-03-10 DIAGNOSIS — Z8673 Personal history of transient ischemic attack (TIA), and cerebral infarction without residual deficits: Secondary | ICD-10-CM | POA: Diagnosis not present

## 2017-03-10 DIAGNOSIS — I25119 Atherosclerotic heart disease of native coronary artery with unspecified angina pectoris: Secondary | ICD-10-CM | POA: Diagnosis not present

## 2017-04-04 DIAGNOSIS — R531 Weakness: Secondary | ICD-10-CM | POA: Diagnosis not present

## 2017-04-04 DIAGNOSIS — F321 Major depressive disorder, single episode, moderate: Secondary | ICD-10-CM | POA: Diagnosis not present

## 2017-04-04 DIAGNOSIS — I2102 ST elevation (STEMI) myocardial infarction involving left anterior descending coronary artery: Secondary | ICD-10-CM | POA: Diagnosis not present

## 2017-04-04 DIAGNOSIS — D6489 Other specified anemias: Secondary | ICD-10-CM | POA: Diagnosis not present

## 2017-04-04 DIAGNOSIS — F5089 Other specified eating disorder: Secondary | ICD-10-CM | POA: Diagnosis not present

## 2017-04-07 DIAGNOSIS — D472 Monoclonal gammopathy: Secondary | ICD-10-CM | POA: Diagnosis not present

## 2017-04-07 DIAGNOSIS — N289 Disorder of kidney and ureter, unspecified: Secondary | ICD-10-CM | POA: Diagnosis not present

## 2017-04-12 DIAGNOSIS — R413 Other amnesia: Secondary | ICD-10-CM | POA: Diagnosis not present

## 2017-04-19 DIAGNOSIS — R413 Other amnesia: Secondary | ICD-10-CM | POA: Diagnosis not present

## 2017-05-01 DIAGNOSIS — N184 Chronic kidney disease, stage 4 (severe): Secondary | ICD-10-CM | POA: Diagnosis not present

## 2017-05-01 DIAGNOSIS — D631 Anemia in chronic kidney disease: Secondary | ICD-10-CM | POA: Diagnosis not present

## 2017-05-01 DIAGNOSIS — I6350 Cerebral infarction due to unspecified occlusion or stenosis of unspecified cerebral artery: Secondary | ICD-10-CM | POA: Diagnosis not present

## 2017-05-01 DIAGNOSIS — F321 Major depressive disorder, single episode, moderate: Secondary | ICD-10-CM | POA: Diagnosis not present

## 2017-05-02 DIAGNOSIS — R413 Other amnesia: Secondary | ICD-10-CM | POA: Diagnosis not present

## 2017-05-15 DIAGNOSIS — D631 Anemia in chronic kidney disease: Secondary | ICD-10-CM | POA: Diagnosis not present

## 2017-05-15 DIAGNOSIS — N189 Chronic kidney disease, unspecified: Secondary | ICD-10-CM | POA: Diagnosis not present

## 2017-05-15 DIAGNOSIS — I252 Old myocardial infarction: Secondary | ICD-10-CM | POA: Diagnosis not present

## 2017-05-15 DIAGNOSIS — D472 Monoclonal gammopathy: Secondary | ICD-10-CM | POA: Diagnosis not present

## 2017-05-26 DIAGNOSIS — I10 Essential (primary) hypertension: Secondary | ICD-10-CM | POA: Diagnosis not present

## 2017-05-26 DIAGNOSIS — I259 Chronic ischemic heart disease, unspecified: Secondary | ICD-10-CM | POA: Diagnosis not present

## 2017-05-26 DIAGNOSIS — N184 Chronic kidney disease, stage 4 (severe): Secondary | ICD-10-CM | POA: Diagnosis not present

## 2017-05-26 DIAGNOSIS — D472 Monoclonal gammopathy: Secondary | ICD-10-CM | POA: Diagnosis not present

## 2017-05-31 ENCOUNTER — Ambulatory Visit (INDEPENDENT_AMBULATORY_CARE_PROVIDER_SITE_OTHER): Payer: Medicare Other | Admitting: Neurology

## 2017-05-31 ENCOUNTER — Encounter: Payer: Self-pay | Admitting: Neurology

## 2017-05-31 VITALS — BP 135/57 | HR 61 | Ht 64.0 in | Wt 127.0 lb

## 2017-05-31 DIAGNOSIS — I63421 Cerebral infarction due to embolism of right anterior cerebral artery: Secondary | ICD-10-CM | POA: Diagnosis not present

## 2017-05-31 NOTE — Progress Notes (Signed)
[facility]  PATIENT: [name] [name] DOB: [birth year]   REASON FOR VISIT: Follow-up for stroke, mild cognitive impairment HISTORY FROM: Patient and daughter    HISTORY OF PRESENT ILLNESS: HISTORY: PS:[name] [name] is a 83 year old pleasant lady who I had last seen me in May 2014 when she had had a stroke.She presented with with pancreatitis to [facility] and imaging demonstrated dilated common bile duct and gallbladder stone. Hospital course was complicated by oliguria, encephalopathy and infection. She developed respiratory failure and was transferred to [facility]. She was noted having slurred speech and some right facial weakness. According to was called but she was not a TPA candidate as last known whether time was not clearly known. CT of the head was unremarkable but subsequently MRI scan demonstrated multiple small bilateral cortical and cerebellar acute as well as subacute infarcts. These were felt to be of embolic etiology but workup for source of embolism was unrewarding. TEE showed could not be completed due to patient having excessive secretions in the airway and being unable to swallow. Telemetry monitoring did not reveal cardiac arrhythmias. Transcranial Doppler studies and carotid Dopplers were unremarkable. 2-D echo showed ejection fraction of 80% with hypertrophic cardiomyopathy. Outpatient prolonged cardiac telemetry monitoring was done for 3 weeks I do not have the actual results but presumably this was normal. V She subsequently improved with therapy and did not keep follow-up with me. Since November of last year she has had a couple falls and she hit her back of her head contusion. Since then the family feels that she's had some memory difficulties. She fractured her shoulder during one of the falls and she still has pain. She feels her balance was off and she has to walk slowly to avoid falling. Her husband has Alzheimer's dementia and her family is  struggling to help him and the patient herself is concerned with having memory difficulties whether she may be getting the same. She was seen at [facility] on 08/13/14 where she had an MRI scan which showed encephalomalacia from an old stroke but no acute abnormality. I have reviewed the films personally. Patient denies any headaches any double vision or any focal extremity weakness or numbness. She has no known history of depression her prior memory problems. She states her memory difficulties and short-term memory problems. She still able to do most activities for herself but her daughters have started helping her much more. She has never been on treatment for depression though she tested mildly positive today on the geriatric depression scale. On Mini-Mental status exam she scored 25/30. She has not had such cognitive testing done in the past. Update 02/09/2015 : PSShe returns for follow-up after initial consultation 3 months ago. She is accompanied by her daughter. She states some memory and cognitive difficulties about unchanged. She continues to have mainly short-term memory difficulties. She lives at home with her husband who has Alzheimer's and she is a caregiver. She is not able to drive and depends on her daughter but is independent in most other activities of daily living. She was started on Wellbutrin by me at last admission but she did not tolerate it well and made her quite emotional and and she stopped it. She denies feeling depressed and does not want to try any medications for that. She had EEG done on 11/06/14 which I have reviewed and was normal. MRI scan of the brain done on 11/22/14 shows bilateral old lacunar infarcts without any acute finding. There is mild  changes of generalized cerebral atrophy and chronic small vessel disease. She fell unfortunately and fractured her right shoulder but this is been treated conservatively without surgery. TSH on 11/06/14 was 0.149 and her primary care  physician is managing that. Vitamin B12 level was normal at 1246. Homocystine was 15.0. RPR was nonreactive.  UPDATE 9/19/17CM [name]. [name], 83 year old female returns for follow-up with her daughter. She was last seen by Dr. Leonie Man 6/6/ 2016. She has a history of stroke that occurred in 2014 and now is having some memory and cognitive difficulties. 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 She no longer drives due to visual problems but is independent in all other activities of daily living. She denies any depression. EEG done in March 2016 was normal. MRI of the brain March 2016 shows old lacunar infarcts without acute findings. There are mild changes of generalized cerebral atrophy and chronic small vessel disease. She and her daughter feel her memory is about the same. She returns for reevaluation  UPDATE 11/23/2015 CM [name]. [name], 83 year old female returns for follow-up with her daughter. She has a history of stroke that occurred in 2014 and now some cognitive impairment. She continues to be caregiver to her husband who has Alzheimer's disease. She remains independent in all activities of daily living. She no longer drives due to visual problems. She feels her memory is about the same. She has not had further stroke or TIA symptoms. MRI of the brain March 2016 shows old lacunar infarcts without acute findings. There are mild changes of generalized cerebral atrophy and chronic small vessel disease. She returns for reevaluation Update 05/31/2017 ; she returns for follow-up after last visit 18 months ago. She is accompanied by her daughter. Patient was hospitalized in August 2018 to [facility] with a stroke. She presented with confusion and memory loss. She had MRI scan of the brain done on 8/15 518 which have personally reviewed showed a distal right anterior cerebral artery infarct. Multiple old prior strokes also seen. Carotid ultrasound done on 05/02/17 showed no significant  extracranial stenosis. Patient was on aspirin and Brilinta since it had a recent cardiac stent placed in June 2018 for myocardial infarction which was continued. The daughter feels that her memory loss may have slightly gotten worse since then. Her confusion however seems to have improved and she is almost back to baseline. Patient did not have any prolonged cardiac monitoring for to fibrillation. She denies any arrhythmias or syncope or history of atrial fibrillation. Patient was previously seen mass in 2014 when she had by cerebral embolic infarcts. At that time process are visualized echocardiogram was attempted but patient was unable to swallow the probe. It is unclear whether she had prolonged outpatient cardiac monitoring at that time and not. I discussed with patient and daughter the need to do prolonged cardiac monitoring to look for paroxysmal atrial fibrillation and she is not agreeable to a loop recorder at the present time but is willing to consider outpatient 30 day heart monitor. REVIEW OF SYSTEMS: Full 14 system review of systems performed and notable only for those listed, all others are neg:   Activity and appetite change, and anemia, memory loss, depression, gait and balance difficulties, heart attack  ALLERGIES: Allergies  Allergen Reactions  . Cefuroxime Axetil     REACTION: nausea She was able to tolerate Vantin fine  . Labetalol     Ortho BP drop  . Nitrofurantoin     REACTION: nausea  . Sulfadiazine  Rash    HOME MEDICATIONS: Outpatient Medications Prior to Visit  Medication Sig Dispense Refill  . aspirin 325 MG tablet Take 1 tablet (325 mg total) by mouth daily.    Marland Kitchen BIOTIN PO Take 1 tablet by mouth daily.    . cholecalciferol (VITAMIN D) 1000 UNITS tablet Take 1,000 Units by mouth daily.      . cyanocobalamin (,VITAMIN B-12,) 1000 MCG/ML injection INJECT 1 ML EVERY 14 DAYS 30 mL 0  . folic acid (FOLVITE) 193 MCG tablet Take 400 mcg by mouth daily.    Marland Kitchen levothyroxine  (SYNTHROID, LEVOTHROID) 88 MCG tablet Take 88 mcg by mouth daily before breakfast.    . LORazepam (ATIVAN) 0.5 MG tablet at bedtime.   1  . Omega-3 Fatty Acids ([employer] FISH OIL) 1000 MG CAPS Take 1 capsule by mouth 1 day or 1 dose. 90 capsule 1  . omeprazole (PRILOSEC OTC) 20 MG tablet Take 2 tablets (40 mg total) by mouth daily. 60 tablet 0  . polyethylene glycol powder (GLYCOLAX/MIRALAX) powder Take 17 g by mouth 2 (two) times daily.  5  . triamcinolone ointment (KENALOG) 0.5 % Apply 1 application topically daily as needed.     Marland Kitchen amLODipine (NORVASC) 2.5 MG tablet Take 1 tablet (2.5 mg total) by mouth 2 (two) times daily. (Patient not taking: Reported on 05/31/2017) 180 tablet 1  . trimethoprim (TRIMPEX) 100 MG tablet Take 100 mg by mouth.     No facility-administered medications prior to visit.     PAST MEDICAL HISTORY: Past Medical History:  Diagnosis Date  . Anemia   . Anxiety   . Biliary acute pancreatitis 07/09/2013   Lap chole on 07/18/13   . Breast pain   . Cellulitis and abscess of unspecified digit   . Complication of anesthesia   . Constipation   . Dyspnea    w/ atypical upper airway symptoms? all vcd/lpr/gerd  . Edema   . Fall 08/2014   fx shoulder  . GERD (gastroesophageal reflux disease)   . Hyperlipidemia   . Hypertension   . Irritable bowel syndrome   . Lightheadedness   . Memory loss   . Monoclonal paraproteinemia   . Osteoarthrosis, unspecified whether generalized or localized, unspecified site   . Other B-complex deficiencies   . Other symptoms involving abdomen and pelvis(789.9)   . Other urinary problems   . Palpitations   . Pancreatitis   . Pneumonia 01/2013  . PONV (postoperative nausea and vomiting)   . Rash and other nonspecific skin eruption   . Renal insufficiency    after dye test, have improved.  . Stroke ([place]) 01/2013   right arm and hand weakness, memory loss  . Thyroid disease   . Torus palatinus   . Unspecified hypothyroidism   .  Unspecified venous (peripheral) insufficiency   . Urgency of urination   . Urinary tract infection, site not specified   . Vasovagal syncope   . Weakness generalized     PAST SURGICAL HISTORY: Past Surgical History:  Procedure Laterality Date  . ABDOMINAL HYSTERECTOMY    . APPENDECTOMY    . BACK SURGERY    . CHOLECYSTECTOMY  07/18/2013   Dr Georgette Dover  . CHOLECYSTECTOMY N/A 07/18/2013   Procedure: LAPAROSCOPIC CHOLECYSTECTOMY;  Surgeon: Imogene Burn. Georgette Dover, MD;  Location: [facility];  Service: General;  Laterality: N/A;  . EYE SURGERY Bilateral 2014   cataract  . SHOULDER SURGERY    . suprapubic bladder suspension    . TEE WITHOUT  CARDIOVERSION N/A 01/16/2013   Procedure: TRANSESOPHAGEAL ECHOCARDIOGRAM (TEE);  Surgeon: Josue Hector, MD;  Location: [facility];  Service: Cardiovascular;  Laterality: N/A;  . thyroiodectomy    . vaginosacropexy      FAMILY HISTORY: Family History  Problem Relation Age of Onset  . Hypertension Father   . Heart Problems Father   . Coronary artery disease Father   . Hypertension Other   . Alzheimer's disease Sister     SOCIAL HISTORY: Social History   Social History  . Marital status: Married    Spouse name: N/A  . Number of children: 3  . Years of education: 12   Occupational History  . Not on file.   Social History Main Topics  . Smoking status: Never Smoker  . Smokeless tobacco: Never Used  . Alcohol use No     Comment: Occasionally during the night to help sleep  . Drug use: No  . Sexual activity: Not on file   Other Topics Concern  . Not on file   Social History Narrative   Patient is married with 3 children.   Patient has hs education.     PHYSICAL EXAM  Vitals:   05/31/17 1514  BP: (!) 135/57  Pulse: 61  Weight: 127 lb (57.6 kg)  Height: 5\' 4"  (1.626 m)   Body mass index is 21.8 kg/m. General: well developed, well nourished Elderly Caucasian lady, seated, in no evident distress Head: head normocephalic and  atraumatic.  Neck: supple with no carotid bruits Cardiovascular: regular rate and rhythm, no murmurs Musculoskeletal: no deformity Skin: no rash/petichiae Vascular: Normal pulses all extremities Neurological examination  Mental Status: Awake and fully alert. MMSE not done last vist 25 /30.. Mood and affect appropriate. Diminished recall 0/3. Able to name only 4 animals. Patient not cooperative for detailed testing states that she is tired and did not sleep well. Cranial Nerves: Fundoscopic exam not done . Pupils equal, briskly reactive to light. Extraocular movements full without nystagmus. Visual fields show partial left homonymous hemianopsia to confrontation testing. Hearing intact. Facial sensation intact. Face, tongue, palate moves normally and symmetrically.  Motor: Normal bulk and tone. Normal strength in all tested extremity muscles. Sensory.: intact to touch , pinprick , position and vibratory sensation.  Coordination: Rapid alternating movements normal in all extremities. Finger-to-nose and heel-to-shin performed accurately bilaterally. Gait and Station: Arises from chair without difficulty. Stance is normal. Gait demonstrates mild unsteadiness and unable to do tandem walking .   Marland Kitchen No assistive device Reflexes: 1+ and symmetric. Toes downgoing.   DIAGNOSTIC DATA (LABS, IMAGING, TESTING) -  Lab Results  Component Value Date   TSH 0.149 (L) 11/06/2014      ASSESSMENT AND PLAN 83 y.o. year old female has a past medical history of Hypertension; Hyperlipidemia; complaints of memory and cognitive difficulties likely from mild cognitive impairment.  Recent admission to [facility] in August 2018 secondary to embolic distal right anterior cerebral artery infarct etiology undetermined. Remote history of bi-cerebral embolic infarcts in 7893. Recent cardiac and plasty stenting in June 2018 for acute myocardial infarction  I had a long discussion the patient and her  daughter regarding her recent embolic stroke and mild memory loss. I recommend checking 30 day heart monitor for paroxysmal atrial fibrillation. Continue aspirin and Brilinta for cardiac and stroke prevention given her recent cardiac stents. Strict control of hypertension with blood pressure goal below 130/90 and lipids with LDL cholesterol goal below 70 mg percent. I encouraged the patient  to eat a healthy diet and to be active. Patient does have mild cognitive impairment which I suspect may get worse following the recent stroke. Continue fish oil and participate in cognitively challenging activities. Return for follow-up in 3 months with my nurse practitioner or call earlier if necessary. Greater than 50% time during this 25 minute visit was spent on counseling and coordination of care about her recent embolic stroke, need to diagnose paroxysmal A. fib and discussion of treatment and evaluation options and answering questions. Antony Contras, MD [facility] [address], [place], [place] [address] [phone]

## 2017-05-31 NOTE — Patient Instructions (Signed)
I had a long discussion the patient and her daughter regarding her recent embolic stroke and mild memory loss. I recommend checking 30 day heart monitor for paroxysmal atrial fibrillation. Continue aspirin and Brilinta for cardiac and stroke prevention given her recent cardiac stents. Strict control of hypertension with blood pressure goal below 130/90 and lipids with LDL cholesterol goal below 70 mg percent. I encouraged the patient to eat a healthy diet and to be active. Patient does have mild cognitive impairment which I suspect may get worse following the recent stroke. Return for follow-up in 3 months with my nurse practitioner or call earlier if necessary

## 2017-06-01 ENCOUNTER — Telehealth: Payer: Self-pay | Admitting: Neurology

## 2017-06-01 NOTE — Telephone Encounter (Signed)
Called and spoke to [name] [name] and patient is scheduled for  30 day Cardiac monitor October 11 th arrive at 3:15 for 3:30 apt.  I  Asked why patient had not been scheduled yet and scheduling  relayed to me that they were short of staff at this time .  I have called Tami Patient's daughter and left her a detailed message about apt and [place]'s telephone number if the apt did not work for them. [phone].  I also relayed to Tami she could call me back if she need if she needed me as well.

## 2017-06-01 NOTE — Telephone Encounter (Signed)
Patients daughter Sondra Barges (listed on [place]) called to advise Ms. Sermeno sees her Cardiologist October 16th and will discuss with the Dr. Johnney Ou Dr. Leonie Man suggest with Cardiac monitor.  FYI

## 2017-06-15 DIAGNOSIS — R112 Nausea with vomiting, unspecified: Secondary | ICD-10-CM | POA: Diagnosis not present

## 2017-06-15 DIAGNOSIS — Z23 Encounter for immunization: Secondary | ICD-10-CM | POA: Diagnosis not present

## 2017-06-15 DIAGNOSIS — F331 Major depressive disorder, recurrent, moderate: Secondary | ICD-10-CM | POA: Diagnosis not present

## 2017-06-15 DIAGNOSIS — R194 Change in bowel habit: Secondary | ICD-10-CM | POA: Diagnosis not present

## 2017-06-19 DIAGNOSIS — R194 Change in bowel habit: Secondary | ICD-10-CM | POA: Diagnosis not present

## 2017-06-20 DIAGNOSIS — Z8673 Personal history of transient ischemic attack (TIA), and cerebral infarction without residual deficits: Secondary | ICD-10-CM | POA: Diagnosis not present

## 2017-06-20 DIAGNOSIS — E039 Hypothyroidism, unspecified: Secondary | ICD-10-CM | POA: Diagnosis not present

## 2017-06-20 DIAGNOSIS — E860 Dehydration: Secondary | ICD-10-CM | POA: Diagnosis not present

## 2017-06-20 DIAGNOSIS — I69331 Monoplegia of upper limb following cerebral infarction affecting right dominant side: Secondary | ICD-10-CM | POA: Diagnosis not present

## 2017-06-20 DIAGNOSIS — F0390 Unspecified dementia without behavioral disturbance: Secondary | ICD-10-CM | POA: Diagnosis not present

## 2017-06-20 DIAGNOSIS — Z7982 Long term (current) use of aspirin: Secondary | ICD-10-CM | POA: Diagnosis not present

## 2017-06-20 DIAGNOSIS — D631 Anemia in chronic kidney disease: Secondary | ICD-10-CM | POA: Diagnosis not present

## 2017-06-20 DIAGNOSIS — K922 Gastrointestinal hemorrhage, unspecified: Secondary | ICD-10-CM | POA: Diagnosis not present

## 2017-06-20 DIAGNOSIS — R531 Weakness: Secondary | ICD-10-CM | POA: Diagnosis not present

## 2017-06-20 DIAGNOSIS — N189 Chronic kidney disease, unspecified: Secondary | ICD-10-CM | POA: Diagnosis not present

## 2017-06-20 DIAGNOSIS — R1111 Vomiting without nausea: Secondary | ICD-10-CM | POA: Diagnosis not present

## 2017-06-20 DIAGNOSIS — K529 Noninfective gastroenteritis and colitis, unspecified: Secondary | ICD-10-CM | POA: Diagnosis not present

## 2017-06-20 DIAGNOSIS — R413 Other amnesia: Secondary | ICD-10-CM | POA: Diagnosis not present

## 2017-06-20 DIAGNOSIS — R112 Nausea with vomiting, unspecified: Secondary | ICD-10-CM | POA: Diagnosis not present

## 2017-06-20 DIAGNOSIS — R197 Diarrhea, unspecified: Secondary | ICD-10-CM | POA: Diagnosis not present

## 2017-06-20 DIAGNOSIS — D472 Monoclonal gammopathy: Secondary | ICD-10-CM | POA: Diagnosis not present

## 2017-06-20 DIAGNOSIS — E876 Hypokalemia: Secondary | ICD-10-CM | POA: Diagnosis not present

## 2017-06-20 DIAGNOSIS — I129 Hypertensive chronic kidney disease with stage 1 through stage 4 chronic kidney disease, or unspecified chronic kidney disease: Secondary | ICD-10-CM | POA: Diagnosis not present

## 2017-06-21 DIAGNOSIS — E860 Dehydration: Secondary | ICD-10-CM | POA: Diagnosis not present

## 2017-06-21 DIAGNOSIS — E876 Hypokalemia: Secondary | ICD-10-CM | POA: Diagnosis not present

## 2017-06-22 DIAGNOSIS — D472 Monoclonal gammopathy: Secondary | ICD-10-CM | POA: Diagnosis not present

## 2017-06-22 DIAGNOSIS — K922 Gastrointestinal hemorrhage, unspecified: Secondary | ICD-10-CM | POA: Diagnosis not present

## 2017-06-22 DIAGNOSIS — D631 Anemia in chronic kidney disease: Secondary | ICD-10-CM | POA: Diagnosis not present

## 2017-06-22 DIAGNOSIS — N189 Chronic kidney disease, unspecified: Secondary | ICD-10-CM | POA: Diagnosis not present

## 2017-06-22 DIAGNOSIS — R413 Other amnesia: Secondary | ICD-10-CM

## 2017-06-22 DIAGNOSIS — E876 Hypokalemia: Secondary | ICD-10-CM | POA: Diagnosis not present

## 2017-06-22 DIAGNOSIS — E860 Dehydration: Secondary | ICD-10-CM | POA: Diagnosis not present

## 2017-06-23 DIAGNOSIS — N189 Chronic kidney disease, unspecified: Secondary | ICD-10-CM | POA: Diagnosis not present

## 2017-06-23 DIAGNOSIS — D631 Anemia in chronic kidney disease: Secondary | ICD-10-CM | POA: Diagnosis not present

## 2017-06-23 DIAGNOSIS — D472 Monoclonal gammopathy: Secondary | ICD-10-CM | POA: Diagnosis not present

## 2017-06-23 DIAGNOSIS — E876 Hypokalemia: Secondary | ICD-10-CM | POA: Diagnosis not present

## 2017-06-23 DIAGNOSIS — E860 Dehydration: Secondary | ICD-10-CM | POA: Diagnosis not present

## 2017-06-23 DIAGNOSIS — R531 Weakness: Secondary | ICD-10-CM | POA: Diagnosis not present

## 2017-06-25 DIAGNOSIS — I129 Hypertensive chronic kidney disease with stage 1 through stage 4 chronic kidney disease, or unspecified chronic kidney disease: Secondary | ICD-10-CM | POA: Diagnosis not present

## 2017-06-25 DIAGNOSIS — E039 Hypothyroidism, unspecified: Secondary | ICD-10-CM | POA: Diagnosis not present

## 2017-06-25 DIAGNOSIS — D631 Anemia in chronic kidney disease: Secondary | ICD-10-CM | POA: Diagnosis not present

## 2017-06-25 DIAGNOSIS — G603 Idiopathic progressive neuropathy: Secondary | ICD-10-CM | POA: Diagnosis not present

## 2017-06-25 DIAGNOSIS — I25110 Atherosclerotic heart disease of native coronary artery with unstable angina pectoris: Secondary | ICD-10-CM | POA: Diagnosis not present

## 2017-06-25 DIAGNOSIS — K5190 Ulcerative colitis, unspecified, without complications: Secondary | ICD-10-CM | POA: Diagnosis not present

## 2017-06-25 DIAGNOSIS — N183 Chronic kidney disease, stage 3 (moderate): Secondary | ICD-10-CM | POA: Diagnosis not present

## 2017-06-25 DIAGNOSIS — Z9181 History of falling: Secondary | ICD-10-CM | POA: Diagnosis not present

## 2017-06-25 DIAGNOSIS — I69331 Monoplegia of upper limb following cerebral infarction affecting right dominant side: Secondary | ICD-10-CM | POA: Diagnosis not present

## 2017-06-25 DIAGNOSIS — Z7982 Long term (current) use of aspirin: Secondary | ICD-10-CM | POA: Diagnosis not present

## 2017-06-26 DIAGNOSIS — K5150 Left sided colitis without complications: Secondary | ICD-10-CM | POA: Diagnosis not present

## 2017-06-26 DIAGNOSIS — D631 Anemia in chronic kidney disease: Secondary | ICD-10-CM | POA: Diagnosis not present

## 2017-06-26 DIAGNOSIS — N183 Chronic kidney disease, stage 3 (moderate): Secondary | ICD-10-CM | POA: Diagnosis not present

## 2017-07-12 DIAGNOSIS — D638 Anemia in other chronic diseases classified elsewhere: Secondary | ICD-10-CM | POA: Diagnosis not present

## 2017-07-12 DIAGNOSIS — R627 Adult failure to thrive: Secondary | ICD-10-CM | POA: Diagnosis not present

## 2017-07-12 DIAGNOSIS — K5909 Other constipation: Secondary | ICD-10-CM | POA: Diagnosis not present

## 2017-07-12 DIAGNOSIS — D631 Anemia in chronic kidney disease: Secondary | ICD-10-CM | POA: Diagnosis not present

## 2017-07-12 DIAGNOSIS — F329 Major depressive disorder, single episode, unspecified: Secondary | ICD-10-CM | POA: Diagnosis not present

## 2017-07-12 DIAGNOSIS — R110 Nausea: Secondary | ICD-10-CM | POA: Diagnosis not present

## 2017-07-12 DIAGNOSIS — N189 Chronic kidney disease, unspecified: Secondary | ICD-10-CM | POA: Diagnosis not present

## 2017-07-12 DIAGNOSIS — I639 Cerebral infarction, unspecified: Secondary | ICD-10-CM | POA: Diagnosis not present

## 2017-07-12 DIAGNOSIS — R634 Abnormal weight loss: Secondary | ICD-10-CM | POA: Diagnosis not present

## 2017-07-12 DIAGNOSIS — D472 Monoclonal gammopathy: Secondary | ICD-10-CM | POA: Diagnosis not present

## 2017-07-17 DIAGNOSIS — F418 Other specified anxiety disorders: Secondary | ICD-10-CM | POA: Diagnosis not present

## 2017-07-17 DIAGNOSIS — K219 Gastro-esophageal reflux disease without esophagitis: Secondary | ICD-10-CM | POA: Diagnosis not present

## 2017-07-17 DIAGNOSIS — N179 Acute kidney failure, unspecified: Secondary | ICD-10-CM | POA: Diagnosis not present

## 2017-07-17 DIAGNOSIS — I129 Hypertensive chronic kidney disease with stage 1 through stage 4 chronic kidney disease, or unspecified chronic kidney disease: Secondary | ICD-10-CM | POA: Diagnosis not present

## 2017-07-17 DIAGNOSIS — R944 Abnormal results of kidney function studies: Secondary | ICD-10-CM | POA: Diagnosis not present

## 2017-07-17 DIAGNOSIS — I10 Essential (primary) hypertension: Secondary | ICD-10-CM | POA: Diagnosis not present

## 2017-07-17 DIAGNOSIS — E860 Dehydration: Secondary | ICD-10-CM | POA: Diagnosis not present

## 2017-07-17 DIAGNOSIS — N184 Chronic kidney disease, stage 4 (severe): Secondary | ICD-10-CM | POA: Diagnosis not present

## 2017-07-17 DIAGNOSIS — E876 Hypokalemia: Secondary | ICD-10-CM | POA: Diagnosis not present

## 2017-07-17 DIAGNOSIS — R112 Nausea with vomiting, unspecified: Secondary | ICD-10-CM | POA: Diagnosis not present

## 2017-07-18 DIAGNOSIS — R197 Diarrhea, unspecified: Secondary | ICD-10-CM | POA: Diagnosis not present

## 2017-07-18 DIAGNOSIS — I10 Essential (primary) hypertension: Secondary | ICD-10-CM | POA: Diagnosis not present

## 2017-07-18 DIAGNOSIS — N179 Acute kidney failure, unspecified: Secondary | ICD-10-CM | POA: Diagnosis not present

## 2017-07-18 DIAGNOSIS — I129 Hypertensive chronic kidney disease with stage 1 through stage 4 chronic kidney disease, or unspecified chronic kidney disease: Secondary | ICD-10-CM | POA: Diagnosis not present

## 2017-07-18 DIAGNOSIS — D638 Anemia in other chronic diseases classified elsewhere: Secondary | ICD-10-CM | POA: Diagnosis not present

## 2017-07-18 DIAGNOSIS — E861 Hypovolemia: Secondary | ICD-10-CM | POA: Diagnosis not present

## 2017-07-18 DIAGNOSIS — R1111 Vomiting without nausea: Secondary | ICD-10-CM | POA: Diagnosis not present

## 2017-07-18 DIAGNOSIS — K3189 Other diseases of stomach and duodenum: Secondary | ICD-10-CM | POA: Diagnosis not present

## 2017-07-18 DIAGNOSIS — E876 Hypokalemia: Secondary | ICD-10-CM | POA: Diagnosis not present

## 2017-07-18 DIAGNOSIS — F418 Other specified anxiety disorders: Secondary | ICD-10-CM | POA: Diagnosis not present

## 2017-07-18 DIAGNOSIS — K529 Noninfective gastroenteritis and colitis, unspecified: Secondary | ICD-10-CM | POA: Diagnosis not present

## 2017-07-18 DIAGNOSIS — R112 Nausea with vomiting, unspecified: Secondary | ICD-10-CM | POA: Diagnosis not present

## 2017-07-18 DIAGNOSIS — R109 Unspecified abdominal pain: Secondary | ICD-10-CM | POA: Diagnosis not present

## 2017-07-18 DIAGNOSIS — E860 Dehydration: Secondary | ICD-10-CM | POA: Diagnosis not present

## 2017-07-18 DIAGNOSIS — K50018 Crohn's disease of small intestine with other complication: Secondary | ICD-10-CM | POA: Diagnosis not present

## 2017-07-18 DIAGNOSIS — R944 Abnormal results of kidney function studies: Secondary | ICD-10-CM | POA: Diagnosis not present

## 2017-07-18 DIAGNOSIS — K219 Gastro-esophageal reflux disease without esophagitis: Secondary | ICD-10-CM | POA: Diagnosis not present

## 2017-07-18 DIAGNOSIS — R8271 Bacteriuria: Secondary | ICD-10-CM | POA: Diagnosis not present

## 2017-07-18 DIAGNOSIS — K449 Diaphragmatic hernia without obstruction or gangrene: Secondary | ICD-10-CM | POA: Diagnosis not present

## 2017-07-18 DIAGNOSIS — I2510 Atherosclerotic heart disease of native coronary artery without angina pectoris: Secondary | ICD-10-CM | POA: Diagnosis not present

## 2017-07-18 DIAGNOSIS — K5730 Diverticulosis of large intestine without perforation or abscess without bleeding: Secondary | ICD-10-CM | POA: Diagnosis not present

## 2017-07-18 DIAGNOSIS — K6389 Other specified diseases of intestine: Secondary | ICD-10-CM | POA: Diagnosis not present

## 2017-07-18 DIAGNOSIS — K624 Stenosis of anus and rectum: Secondary | ICD-10-CM | POA: Diagnosis not present

## 2017-07-18 DIAGNOSIS — E46 Unspecified protein-calorie malnutrition: Secondary | ICD-10-CM | POA: Diagnosis not present

## 2017-07-18 DIAGNOSIS — N184 Chronic kidney disease, stage 4 (severe): Secondary | ICD-10-CM | POA: Diagnosis not present

## 2017-07-19 DIAGNOSIS — K50018 Crohn's disease of small intestine with other complication: Secondary | ICD-10-CM | POA: Diagnosis not present

## 2017-07-19 DIAGNOSIS — E876 Hypokalemia: Secondary | ICD-10-CM | POA: Diagnosis not present

## 2017-07-19 DIAGNOSIS — K449 Diaphragmatic hernia without obstruction or gangrene: Secondary | ICD-10-CM | POA: Diagnosis not present

## 2017-07-19 DIAGNOSIS — K6389 Other specified diseases of intestine: Secondary | ICD-10-CM | POA: Diagnosis not present

## 2017-07-19 DIAGNOSIS — K219 Gastro-esophageal reflux disease without esophagitis: Secondary | ICD-10-CM | POA: Diagnosis not present

## 2017-07-19 DIAGNOSIS — R112 Nausea with vomiting, unspecified: Secondary | ICD-10-CM | POA: Diagnosis not present

## 2017-07-19 DIAGNOSIS — N179 Acute kidney failure, unspecified: Secondary | ICD-10-CM | POA: Diagnosis not present

## 2017-07-19 DIAGNOSIS — E860 Dehydration: Secondary | ICD-10-CM | POA: Diagnosis not present

## 2017-07-19 DIAGNOSIS — K3189 Other diseases of stomach and duodenum: Secondary | ICD-10-CM | POA: Diagnosis not present

## 2017-07-20 DIAGNOSIS — R109 Unspecified abdominal pain: Secondary | ICD-10-CM | POA: Diagnosis not present

## 2017-07-20 DIAGNOSIS — R112 Nausea with vomiting, unspecified: Secondary | ICD-10-CM | POA: Diagnosis not present

## 2017-07-20 DIAGNOSIS — N179 Acute kidney failure, unspecified: Secondary | ICD-10-CM | POA: Diagnosis not present

## 2017-07-20 DIAGNOSIS — K5730 Diverticulosis of large intestine without perforation or abscess without bleeding: Secondary | ICD-10-CM | POA: Diagnosis not present

## 2017-07-20 DIAGNOSIS — K529 Noninfective gastroenteritis and colitis, unspecified: Secondary | ICD-10-CM | POA: Diagnosis not present

## 2017-07-20 DIAGNOSIS — K6389 Other specified diseases of intestine: Secondary | ICD-10-CM | POA: Diagnosis not present

## 2017-07-20 DIAGNOSIS — E876 Hypokalemia: Secondary | ICD-10-CM | POA: Diagnosis not present

## 2017-07-20 DIAGNOSIS — K50018 Crohn's disease of small intestine with other complication: Secondary | ICD-10-CM | POA: Diagnosis not present

## 2017-07-20 DIAGNOSIS — K624 Stenosis of anus and rectum: Secondary | ICD-10-CM | POA: Diagnosis not present

## 2017-07-20 DIAGNOSIS — E860 Dehydration: Secondary | ICD-10-CM | POA: Diagnosis not present

## 2017-07-20 DIAGNOSIS — R197 Diarrhea, unspecified: Secondary | ICD-10-CM | POA: Diagnosis not present

## 2017-07-20 DIAGNOSIS — K3189 Other diseases of stomach and duodenum: Secondary | ICD-10-CM | POA: Diagnosis not present

## 2017-07-21 DIAGNOSIS — N179 Acute kidney failure, unspecified: Secondary | ICD-10-CM | POA: Diagnosis not present

## 2017-07-21 DIAGNOSIS — E876 Hypokalemia: Secondary | ICD-10-CM | POA: Diagnosis not present

## 2017-07-21 DIAGNOSIS — E860 Dehydration: Secondary | ICD-10-CM | POA: Diagnosis not present

## 2017-07-21 DIAGNOSIS — R112 Nausea with vomiting, unspecified: Secondary | ICD-10-CM | POA: Diagnosis not present

## 2017-07-25 DIAGNOSIS — K219 Gastro-esophageal reflux disease without esophagitis: Secondary | ICD-10-CM | POA: Diagnosis not present

## 2017-07-25 DIAGNOSIS — R112 Nausea with vomiting, unspecified: Secondary | ICD-10-CM | POA: Diagnosis not present

## 2017-07-25 DIAGNOSIS — R197 Diarrhea, unspecified: Secondary | ICD-10-CM | POA: Diagnosis not present

## 2017-07-26 DIAGNOSIS — N189 Chronic kidney disease, unspecified: Secondary | ICD-10-CM | POA: Diagnosis not present

## 2017-07-26 DIAGNOSIS — R630 Anorexia: Secondary | ICD-10-CM | POA: Diagnosis not present

## 2017-07-26 DIAGNOSIS — E876 Hypokalemia: Secondary | ICD-10-CM | POA: Diagnosis not present

## 2017-08-21 DIAGNOSIS — D472 Monoclonal gammopathy: Secondary | ICD-10-CM

## 2017-09-01 DIAGNOSIS — R944 Abnormal results of kidney function studies: Secondary | ICD-10-CM | POA: Diagnosis not present

## 2017-09-11 DIAGNOSIS — R339 Retention of urine, unspecified: Secondary | ICD-10-CM | POA: Diagnosis not present

## 2017-09-11 DIAGNOSIS — E038 Other specified hypothyroidism: Secondary | ICD-10-CM | POA: Diagnosis not present

## 2017-09-11 DIAGNOSIS — N184 Chronic kidney disease, stage 4 (severe): Secondary | ICD-10-CM | POA: Diagnosis not present

## 2017-09-11 DIAGNOSIS — N3090 Cystitis, unspecified without hematuria: Secondary | ICD-10-CM | POA: Diagnosis not present

## 2017-09-11 DIAGNOSIS — N952 Postmenopausal atrophic vaginitis: Secondary | ICD-10-CM | POA: Diagnosis not present

## 2017-09-11 DIAGNOSIS — I2510 Atherosclerotic heart disease of native coronary artery without angina pectoris: Secondary | ICD-10-CM | POA: Diagnosis not present

## 2017-09-11 DIAGNOSIS — I69351 Hemiplegia and hemiparesis following cerebral infarction affecting right dominant side: Secondary | ICD-10-CM | POA: Diagnosis not present

## 2017-09-13 DIAGNOSIS — N184 Chronic kidney disease, stage 4 (severe): Secondary | ICD-10-CM | POA: Diagnosis not present

## 2017-09-13 DIAGNOSIS — E782 Mixed hyperlipidemia: Secondary | ICD-10-CM | POA: Diagnosis not present

## 2017-09-13 DIAGNOSIS — Z111 Encounter for screening for respiratory tuberculosis: Secondary | ICD-10-CM | POA: Diagnosis not present

## 2017-09-13 DIAGNOSIS — Z209 Contact with and (suspected) exposure to unspecified communicable disease: Secondary | ICD-10-CM | POA: Diagnosis not present

## 2017-09-13 DIAGNOSIS — E038 Other specified hypothyroidism: Secondary | ICD-10-CM | POA: Diagnosis not present

## 2017-09-22 DIAGNOSIS — I2510 Atherosclerotic heart disease of native coronary artery without angina pectoris: Secondary | ICD-10-CM | POA: Diagnosis not present

## 2017-09-22 DIAGNOSIS — G309 Alzheimer's disease, unspecified: Secondary | ICD-10-CM | POA: Diagnosis not present

## 2017-09-22 DIAGNOSIS — N184 Chronic kidney disease, stage 4 (severe): Secondary | ICD-10-CM | POA: Diagnosis not present

## 2017-09-22 DIAGNOSIS — I129 Hypertensive chronic kidney disease with stage 1 through stage 4 chronic kidney disease, or unspecified chronic kidney disease: Secondary | ICD-10-CM | POA: Diagnosis not present

## 2017-09-25 ENCOUNTER — Ambulatory Visit: Payer: Medicare Other | Admitting: Nurse Practitioner

## 2017-09-25 DIAGNOSIS — F330 Major depressive disorder, recurrent, mild: Secondary | ICD-10-CM | POA: Diagnosis not present

## 2017-09-25 DIAGNOSIS — N184 Chronic kidney disease, stage 4 (severe): Secondary | ICD-10-CM | POA: Diagnosis not present

## 2017-09-25 DIAGNOSIS — I69331 Monoplegia of upper limb following cerebral infarction affecting right dominant side: Secondary | ICD-10-CM | POA: Diagnosis not present

## 2017-09-25 DIAGNOSIS — I5020 Unspecified systolic (congestive) heart failure: Secondary | ICD-10-CM | POA: Diagnosis not present

## 2017-09-25 DIAGNOSIS — G603 Idiopathic progressive neuropathy: Secondary | ICD-10-CM | POA: Diagnosis not present

## 2017-09-25 DIAGNOSIS — Z7982 Long term (current) use of aspirin: Secondary | ICD-10-CM | POA: Diagnosis not present

## 2017-09-25 DIAGNOSIS — E039 Hypothyroidism, unspecified: Secondary | ICD-10-CM | POA: Diagnosis not present

## 2017-09-25 DIAGNOSIS — F0150 Vascular dementia without behavioral disturbance: Secondary | ICD-10-CM | POA: Diagnosis not present

## 2017-09-25 DIAGNOSIS — D631 Anemia in chronic kidney disease: Secondary | ICD-10-CM | POA: Diagnosis not present

## 2017-09-25 DIAGNOSIS — I130 Hypertensive heart and chronic kidney disease with heart failure and stage 1 through stage 4 chronic kidney disease, or unspecified chronic kidney disease: Secondary | ICD-10-CM | POA: Diagnosis not present

## 2017-09-25 DIAGNOSIS — I69398 Other sequelae of cerebral infarction: Secondary | ICD-10-CM | POA: Diagnosis not present

## 2017-09-25 DIAGNOSIS — Z9181 History of falling: Secondary | ICD-10-CM | POA: Diagnosis not present

## 2017-09-25 DIAGNOSIS — K5190 Ulcerative colitis, unspecified, without complications: Secondary | ICD-10-CM | POA: Diagnosis not present

## 2017-09-25 DIAGNOSIS — I2510 Atherosclerotic heart disease of native coronary artery without angina pectoris: Secondary | ICD-10-CM | POA: Diagnosis not present

## 2017-10-23 DIAGNOSIS — D631 Anemia in chronic kidney disease: Secondary | ICD-10-CM | POA: Diagnosis not present

## 2017-10-23 DIAGNOSIS — N189 Chronic kidney disease, unspecified: Secondary | ICD-10-CM | POA: Diagnosis not present

## 2017-10-23 DIAGNOSIS — D472 Monoclonal gammopathy: Secondary | ICD-10-CM | POA: Diagnosis not present

## 2017-10-25 DIAGNOSIS — R0989 Other specified symptoms and signs involving the circulatory and respiratory systems: Secondary | ICD-10-CM | POA: Diagnosis not present

## 2017-10-25 DIAGNOSIS — R05 Cough: Secondary | ICD-10-CM | POA: Diagnosis not present

## 2017-10-26 DIAGNOSIS — R05 Cough: Secondary | ICD-10-CM | POA: Diagnosis not present

## 2017-10-26 DIAGNOSIS — R0989 Other specified symptoms and signs involving the circulatory and respiratory systems: Secondary | ICD-10-CM | POA: Diagnosis not present

## 2017-10-26 DIAGNOSIS — J189 Pneumonia, unspecified organism: Secondary | ICD-10-CM | POA: Diagnosis not present

## 2017-10-30 DIAGNOSIS — R21 Rash and other nonspecific skin eruption: Secondary | ICD-10-CM | POA: Diagnosis not present

## 2017-10-30 DIAGNOSIS — J189 Pneumonia, unspecified organism: Secondary | ICD-10-CM | POA: Diagnosis not present

## 2017-10-30 DIAGNOSIS — F0150 Vascular dementia without behavioral disturbance: Secondary | ICD-10-CM | POA: Diagnosis not present

## 2017-10-31 DIAGNOSIS — T887XXS Unspecified adverse effect of drug or medicament, sequela: Secondary | ICD-10-CM | POA: Diagnosis not present

## 2017-10-31 DIAGNOSIS — J189 Pneumonia, unspecified organism: Secondary | ICD-10-CM | POA: Diagnosis not present

## 2017-10-31 DIAGNOSIS — R5383 Other fatigue: Secondary | ICD-10-CM | POA: Diagnosis not present

## 2017-10-31 DIAGNOSIS — L989 Disorder of the skin and subcutaneous tissue, unspecified: Secondary | ICD-10-CM | POA: Diagnosis not present

## 2017-11-14 DIAGNOSIS — W19XXXA Unspecified fall, initial encounter: Secondary | ICD-10-CM | POA: Diagnosis not present

## 2017-11-14 DIAGNOSIS — R079 Chest pain, unspecified: Secondary | ICD-10-CM | POA: Diagnosis not present

## 2017-11-14 DIAGNOSIS — R269 Unspecified abnormalities of gait and mobility: Secondary | ICD-10-CM | POA: Diagnosis not present

## 2017-11-14 DIAGNOSIS — I10 Essential (primary) hypertension: Secondary | ICD-10-CM | POA: Diagnosis not present

## 2017-11-14 DIAGNOSIS — M6281 Muscle weakness (generalized): Secondary | ICD-10-CM | POA: Diagnosis not present

## 2017-11-14 DIAGNOSIS — R1030 Lower abdominal pain, unspecified: Secondary | ICD-10-CM | POA: Diagnosis not present

## 2017-11-15 DIAGNOSIS — R269 Unspecified abnormalities of gait and mobility: Secondary | ICD-10-CM | POA: Diagnosis not present

## 2017-11-15 DIAGNOSIS — M25561 Pain in right knee: Secondary | ICD-10-CM | POA: Diagnosis not present

## 2017-11-15 DIAGNOSIS — M25562 Pain in left knee: Secondary | ICD-10-CM | POA: Diagnosis not present

## 2017-11-15 DIAGNOSIS — S4991XA Unspecified injury of right shoulder and upper arm, initial encounter: Secondary | ICD-10-CM | POA: Diagnosis not present

## 2017-11-17 DIAGNOSIS — M25562 Pain in left knee: Secondary | ICD-10-CM | POA: Diagnosis not present

## 2017-11-17 DIAGNOSIS — M25561 Pain in right knee: Secondary | ICD-10-CM | POA: Diagnosis not present

## 2017-11-17 DIAGNOSIS — M25511 Pain in right shoulder: Secondary | ICD-10-CM | POA: Diagnosis not present

## 2017-11-17 DIAGNOSIS — R296 Repeated falls: Secondary | ICD-10-CM | POA: Diagnosis not present

## 2017-11-20 DIAGNOSIS — F0150 Vascular dementia without behavioral disturbance: Secondary | ICD-10-CM | POA: Diagnosis not present

## 2017-11-20 DIAGNOSIS — Z9181 History of falling: Secondary | ICD-10-CM | POA: Diagnosis not present

## 2017-11-20 DIAGNOSIS — W19XXXS Unspecified fall, sequela: Secondary | ICD-10-CM | POA: Diagnosis not present

## 2017-11-20 DIAGNOSIS — H9202 Otalgia, left ear: Secondary | ICD-10-CM | POA: Diagnosis not present

## 2017-11-27 DIAGNOSIS — R54 Age-related physical debility: Secondary | ICD-10-CM | POA: Diagnosis not present

## 2017-11-27 DIAGNOSIS — R509 Fever, unspecified: Secondary | ICD-10-CM | POA: Diagnosis not present

## 2017-11-27 DIAGNOSIS — R0789 Other chest pain: Secondary | ICD-10-CM | POA: Diagnosis not present

## 2017-11-27 DIAGNOSIS — R05 Cough: Secondary | ICD-10-CM | POA: Diagnosis not present

## 2017-11-27 DIAGNOSIS — F0150 Vascular dementia without behavioral disturbance: Secondary | ICD-10-CM | POA: Diagnosis not present

## 2017-12-15 DIAGNOSIS — R05 Cough: Secondary | ICD-10-CM | POA: Diagnosis not present

## 2017-12-15 DIAGNOSIS — J309 Allergic rhinitis, unspecified: Secondary | ICD-10-CM | POA: Diagnosis not present

## 2017-12-15 DIAGNOSIS — F0150 Vascular dementia without behavioral disturbance: Secondary | ICD-10-CM | POA: Diagnosis not present

## 2017-12-15 DIAGNOSIS — R0989 Other specified symptoms and signs involving the circulatory and respiratory systems: Secondary | ICD-10-CM | POA: Diagnosis not present

## 2017-12-21 DIAGNOSIS — H5789 Other specified disorders of eye and adnexa: Secondary | ICD-10-CM | POA: Diagnosis not present

## 2018-01-12 DIAGNOSIS — N184 Chronic kidney disease, stage 4 (severe): Secondary | ICD-10-CM | POA: Diagnosis not present

## 2018-01-12 DIAGNOSIS — I129 Hypertensive chronic kidney disease with stage 1 through stage 4 chronic kidney disease, or unspecified chronic kidney disease: Secondary | ICD-10-CM | POA: Diagnosis not present

## 2018-01-12 DIAGNOSIS — I5032 Chronic diastolic (congestive) heart failure: Secondary | ICD-10-CM | POA: Diagnosis not present

## 2018-01-12 DIAGNOSIS — E039 Hypothyroidism, unspecified: Secondary | ICD-10-CM | POA: Diagnosis not present

## 2018-01-15 DIAGNOSIS — N189 Chronic kidney disease, unspecified: Secondary | ICD-10-CM | POA: Diagnosis not present

## 2018-01-15 DIAGNOSIS — D631 Anemia in chronic kidney disease: Secondary | ICD-10-CM | POA: Diagnosis not present

## 2018-01-15 DIAGNOSIS — D472 Monoclonal gammopathy: Secondary | ICD-10-CM | POA: Diagnosis not present

## 2018-01-16 DIAGNOSIS — F0150 Vascular dementia without behavioral disturbance: Secondary | ICD-10-CM | POA: Diagnosis not present

## 2018-01-16 DIAGNOSIS — R799 Abnormal finding of blood chemistry, unspecified: Secondary | ICD-10-CM | POA: Diagnosis not present

## 2018-01-16 DIAGNOSIS — N184 Chronic kidney disease, stage 4 (severe): Secondary | ICD-10-CM | POA: Diagnosis not present

## 2018-01-16 DIAGNOSIS — D649 Anemia, unspecified: Secondary | ICD-10-CM | POA: Diagnosis not present

## 2018-01-26 DIAGNOSIS — D649 Anemia, unspecified: Secondary | ICD-10-CM | POA: Diagnosis not present

## 2018-01-26 DIAGNOSIS — R110 Nausea: Secondary | ICD-10-CM | POA: Diagnosis not present

## 2018-01-26 DIAGNOSIS — F419 Anxiety disorder, unspecified: Secondary | ICD-10-CM | POA: Diagnosis not present

## 2018-01-26 DIAGNOSIS — F0390 Unspecified dementia without behavioral disturbance: Secondary | ICD-10-CM | POA: Diagnosis not present

## 2018-02-19 DIAGNOSIS — S91301A Unspecified open wound, right foot, initial encounter: Secondary | ICD-10-CM | POA: Diagnosis not present

## 2018-02-19 DIAGNOSIS — F0150 Vascular dementia without behavioral disturbance: Secondary | ICD-10-CM | POA: Diagnosis not present

## 2018-02-19 DIAGNOSIS — S91201A Unspecified open wound of right great toe with damage to nail, initial encounter: Secondary | ICD-10-CM | POA: Diagnosis not present

## 2018-02-28 DIAGNOSIS — I2510 Atherosclerotic heart disease of native coronary artery without angina pectoris: Secondary | ICD-10-CM | POA: Diagnosis not present

## 2018-02-28 DIAGNOSIS — I252 Old myocardial infarction: Secondary | ICD-10-CM | POA: Diagnosis not present

## 2018-02-28 DIAGNOSIS — R233 Spontaneous ecchymoses: Secondary | ICD-10-CM | POA: Diagnosis not present

## 2018-03-12 DIAGNOSIS — N3090 Cystitis, unspecified without hematuria: Secondary | ICD-10-CM | POA: Diagnosis not present

## 2018-03-12 DIAGNOSIS — N952 Postmenopausal atrophic vaginitis: Secondary | ICD-10-CM | POA: Diagnosis not present

## 2018-03-21 DIAGNOSIS — R0989 Other specified symptoms and signs involving the circulatory and respiratory systems: Secondary | ICD-10-CM | POA: Diagnosis not present

## 2018-03-21 DIAGNOSIS — R05 Cough: Secondary | ICD-10-CM | POA: Diagnosis not present

## 2018-03-21 DIAGNOSIS — I5032 Chronic diastolic (congestive) heart failure: Secondary | ICD-10-CM | POA: Diagnosis not present

## 2018-03-28 DIAGNOSIS — I5032 Chronic diastolic (congestive) heart failure: Secondary | ICD-10-CM | POA: Diagnosis not present

## 2018-03-28 DIAGNOSIS — R05 Cough: Secondary | ICD-10-CM | POA: Diagnosis not present

## 2018-03-28 DIAGNOSIS — R5381 Other malaise: Secondary | ICD-10-CM | POA: Diagnosis not present

## 2018-04-16 DIAGNOSIS — D631 Anemia in chronic kidney disease: Secondary | ICD-10-CM

## 2018-04-16 DIAGNOSIS — D472 Monoclonal gammopathy: Secondary | ICD-10-CM

## 2018-04-16 DIAGNOSIS — N189 Chronic kidney disease, unspecified: Secondary | ICD-10-CM

## 2018-04-18 DIAGNOSIS — R5382 Chronic fatigue, unspecified: Secondary | ICD-10-CM | POA: Diagnosis not present

## 2018-04-18 DIAGNOSIS — D631 Anemia in chronic kidney disease: Secondary | ICD-10-CM | POA: Diagnosis not present

## 2018-04-18 DIAGNOSIS — D519 Vitamin B12 deficiency anemia, unspecified: Secondary | ICD-10-CM | POA: Diagnosis not present

## 2018-05-09 DIAGNOSIS — E039 Hypothyroidism, unspecified: Secondary | ICD-10-CM | POA: Diagnosis not present

## 2018-05-09 DIAGNOSIS — G4700 Insomnia, unspecified: Secondary | ICD-10-CM | POA: Diagnosis not present

## 2018-05-09 DIAGNOSIS — E559 Vitamin D deficiency, unspecified: Secondary | ICD-10-CM | POA: Diagnosis not present

## 2018-05-09 DIAGNOSIS — D519 Vitamin B12 deficiency anemia, unspecified: Secondary | ICD-10-CM | POA: Diagnosis not present

## 2018-05-15 DIAGNOSIS — Z23 Encounter for immunization: Secondary | ICD-10-CM | POA: Diagnosis not present

## 2018-06-01 DIAGNOSIS — G309 Alzheimer's disease, unspecified: Secondary | ICD-10-CM | POA: Diagnosis not present

## 2018-06-01 DIAGNOSIS — L299 Pruritus, unspecified: Secondary | ICD-10-CM | POA: Diagnosis not present

## 2018-06-01 DIAGNOSIS — R54 Age-related physical debility: Secondary | ICD-10-CM | POA: Diagnosis not present

## 2018-06-05 DIAGNOSIS — E559 Vitamin D deficiency, unspecified: Secondary | ICD-10-CM | POA: Diagnosis not present

## 2018-06-15 DIAGNOSIS — F0150 Vascular dementia without behavioral disturbance: Secondary | ICD-10-CM | POA: Diagnosis not present

## 2018-06-15 DIAGNOSIS — R21 Rash and other nonspecific skin eruption: Secondary | ICD-10-CM | POA: Diagnosis not present

## 2018-07-06 DIAGNOSIS — F0150 Vascular dementia without behavioral disturbance: Secondary | ICD-10-CM | POA: Diagnosis not present

## 2018-07-06 DIAGNOSIS — R42 Dizziness and giddiness: Secondary | ICD-10-CM | POA: Diagnosis not present

## 2018-07-09 DIAGNOSIS — N189 Chronic kidney disease, unspecified: Secondary | ICD-10-CM | POA: Diagnosis not present

## 2018-07-09 DIAGNOSIS — D631 Anemia in chronic kidney disease: Secondary | ICD-10-CM | POA: Diagnosis not present

## 2018-07-16 DIAGNOSIS — C9000 Multiple myeloma not having achieved remission: Secondary | ICD-10-CM | POA: Diagnosis not present

## 2018-07-18 DIAGNOSIS — D631 Anemia in chronic kidney disease: Secondary | ICD-10-CM | POA: Diagnosis not present

## 2018-07-18 DIAGNOSIS — D519 Vitamin B12 deficiency anemia, unspecified: Secondary | ICD-10-CM | POA: Diagnosis not present

## 2018-07-18 DIAGNOSIS — J309 Allergic rhinitis, unspecified: Secondary | ICD-10-CM | POA: Diagnosis not present

## 2018-07-18 DIAGNOSIS — M6281 Muscle weakness (generalized): Secondary | ICD-10-CM | POA: Diagnosis not present

## 2018-07-19 DIAGNOSIS — D631 Anemia in chronic kidney disease: Secondary | ICD-10-CM | POA: Diagnosis not present

## 2018-07-19 DIAGNOSIS — D510 Vitamin B12 deficiency anemia due to intrinsic factor deficiency: Secondary | ICD-10-CM | POA: Diagnosis not present

## 2018-07-20 DIAGNOSIS — F0150 Vascular dementia without behavioral disturbance: Secondary | ICD-10-CM | POA: Diagnosis not present

## 2018-07-20 DIAGNOSIS — Z7982 Long term (current) use of aspirin: Secondary | ICD-10-CM | POA: Diagnosis not present

## 2018-07-20 DIAGNOSIS — I2510 Atherosclerotic heart disease of native coronary artery without angina pectoris: Secondary | ICD-10-CM | POA: Diagnosis not present

## 2018-07-20 DIAGNOSIS — G603 Idiopathic progressive neuropathy: Secondary | ICD-10-CM | POA: Diagnosis not present

## 2018-07-20 DIAGNOSIS — N184 Chronic kidney disease, stage 4 (severe): Secondary | ICD-10-CM | POA: Diagnosis not present

## 2018-07-20 DIAGNOSIS — H5712 Ocular pain, left eye: Secondary | ICD-10-CM | POA: Diagnosis not present

## 2018-07-20 DIAGNOSIS — K5190 Ulcerative colitis, unspecified, without complications: Secondary | ICD-10-CM | POA: Diagnosis not present

## 2018-07-20 DIAGNOSIS — D631 Anemia in chronic kidney disease: Secondary | ICD-10-CM | POA: Diagnosis not present

## 2018-07-20 DIAGNOSIS — F0390 Unspecified dementia without behavioral disturbance: Secondary | ICD-10-CM | POA: Diagnosis not present

## 2018-07-20 DIAGNOSIS — I69331 Monoplegia of upper limb following cerebral infarction affecting right dominant side: Secondary | ICD-10-CM | POA: Diagnosis not present

## 2018-07-20 DIAGNOSIS — Z9181 History of falling: Secondary | ICD-10-CM | POA: Diagnosis not present

## 2018-07-20 DIAGNOSIS — F330 Major depressive disorder, recurrent, mild: Secondary | ICD-10-CM | POA: Diagnosis not present

## 2018-07-20 DIAGNOSIS — Z87440 Personal history of urinary (tract) infections: Secondary | ICD-10-CM | POA: Diagnosis not present

## 2018-07-20 DIAGNOSIS — I69398 Other sequelae of cerebral infarction: Secondary | ICD-10-CM | POA: Diagnosis not present

## 2018-07-20 DIAGNOSIS — I5020 Unspecified systolic (congestive) heart failure: Secondary | ICD-10-CM | POA: Diagnosis not present

## 2018-07-20 DIAGNOSIS — I130 Hypertensive heart and chronic kidney disease with heart failure and stage 1 through stage 4 chronic kidney disease, or unspecified chronic kidney disease: Secondary | ICD-10-CM | POA: Diagnosis not present

## 2018-07-20 DIAGNOSIS — E039 Hypothyroidism, unspecified: Secondary | ICD-10-CM | POA: Diagnosis not present

## 2018-07-20 DIAGNOSIS — E538 Deficiency of other specified B group vitamins: Secondary | ICD-10-CM | POA: Diagnosis not present

## 2018-07-27 DIAGNOSIS — R21 Rash and other nonspecific skin eruption: Secondary | ICD-10-CM | POA: Diagnosis not present

## 2018-07-27 DIAGNOSIS — F0150 Vascular dementia without behavioral disturbance: Secondary | ICD-10-CM | POA: Diagnosis not present

## 2018-07-27 DIAGNOSIS — L299 Pruritus, unspecified: Secondary | ICD-10-CM | POA: Diagnosis not present

## 2018-07-31 DIAGNOSIS — F0150 Vascular dementia without behavioral disturbance: Secondary | ICD-10-CM | POA: Diagnosis not present

## 2018-07-31 DIAGNOSIS — R110 Nausea: Secondary | ICD-10-CM | POA: Diagnosis not present

## 2018-08-23 DIAGNOSIS — R42 Dizziness and giddiness: Secondary | ICD-10-CM | POA: Diagnosis not present

## 2018-08-23 DIAGNOSIS — R5381 Other malaise: Secondary | ICD-10-CM | POA: Diagnosis not present

## 2018-08-23 DIAGNOSIS — H8390 Unspecified disease of inner ear, unspecified ear: Secondary | ICD-10-CM | POA: Diagnosis not present

## 2018-08-23 DIAGNOSIS — R05 Cough: Secondary | ICD-10-CM | POA: Diagnosis not present

## 2018-08-24 DIAGNOSIS — N184 Chronic kidney disease, stage 4 (severe): Secondary | ICD-10-CM | POA: Diagnosis not present

## 2018-08-24 DIAGNOSIS — F0150 Vascular dementia without behavioral disturbance: Secondary | ICD-10-CM | POA: Diagnosis not present

## 2018-08-24 DIAGNOSIS — J189 Pneumonia, unspecified organism: Secondary | ICD-10-CM | POA: Diagnosis not present

## 2018-08-24 DIAGNOSIS — R5381 Other malaise: Secondary | ICD-10-CM | POA: Diagnosis not present

## 2018-08-31 DIAGNOSIS — J189 Pneumonia, unspecified organism: Secondary | ICD-10-CM | POA: Diagnosis not present

## 2018-08-31 DIAGNOSIS — I10 Essential (primary) hypertension: Secondary | ICD-10-CM | POA: Diagnosis not present

## 2018-09-10 DIAGNOSIS — I5032 Chronic diastolic (congestive) heart failure: Secondary | ICD-10-CM | POA: Diagnosis not present

## 2018-09-10 DIAGNOSIS — E039 Hypothyroidism, unspecified: Secondary | ICD-10-CM | POA: Diagnosis not present

## 2018-09-10 DIAGNOSIS — R05 Cough: Secondary | ICD-10-CM | POA: Diagnosis not present

## 2018-09-10 DIAGNOSIS — I129 Hypertensive chronic kidney disease with stage 1 through stage 4 chronic kidney disease, or unspecified chronic kidney disease: Secondary | ICD-10-CM | POA: Diagnosis not present

## 2018-09-10 DIAGNOSIS — N185 Chronic kidney disease, stage 5: Secondary | ICD-10-CM | POA: Diagnosis not present

## 2018-09-12 DIAGNOSIS — I10 Essential (primary) hypertension: Secondary | ICD-10-CM | POA: Diagnosis not present

## 2018-09-12 DIAGNOSIS — D649 Anemia, unspecified: Secondary | ICD-10-CM | POA: Diagnosis not present

## 2018-09-17 DIAGNOSIS — N390 Urinary tract infection, site not specified: Secondary | ICD-10-CM | POA: Diagnosis not present

## 2018-09-17 DIAGNOSIS — R339 Retention of urine, unspecified: Secondary | ICD-10-CM | POA: Diagnosis not present

## 2018-09-18 DIAGNOSIS — E890 Postprocedural hypothyroidism: Secondary | ICD-10-CM | POA: Diagnosis not present

## 2018-09-18 DIAGNOSIS — I10 Essential (primary) hypertension: Secondary | ICD-10-CM | POA: Diagnosis not present

## 2018-09-18 DIAGNOSIS — I2510 Atherosclerotic heart disease of native coronary artery without angina pectoris: Secondary | ICD-10-CM | POA: Diagnosis not present

## 2018-09-18 DIAGNOSIS — N390 Urinary tract infection, site not specified: Secondary | ICD-10-CM | POA: Diagnosis not present

## 2018-09-20 DIAGNOSIS — E039 Hypothyroidism, unspecified: Secondary | ICD-10-CM | POA: Diagnosis not present

## 2018-09-26 DIAGNOSIS — R5383 Other fatigue: Secondary | ICD-10-CM | POA: Diagnosis not present

## 2018-09-26 DIAGNOSIS — D631 Anemia in chronic kidney disease: Secondary | ICD-10-CM | POA: Diagnosis not present

## 2018-09-26 DIAGNOSIS — R54 Age-related physical debility: Secondary | ICD-10-CM | POA: Diagnosis not present

## 2018-09-26 DIAGNOSIS — R42 Dizziness and giddiness: Secondary | ICD-10-CM | POA: Diagnosis not present

## 2018-09-27 DIAGNOSIS — I10 Essential (primary) hypertension: Secondary | ICD-10-CM | POA: Diagnosis not present

## 2018-09-27 DIAGNOSIS — D649 Anemia, unspecified: Secondary | ICD-10-CM | POA: Diagnosis not present

## 2018-10-08 DIAGNOSIS — N189 Chronic kidney disease, unspecified: Secondary | ICD-10-CM | POA: Diagnosis not present

## 2018-10-08 DIAGNOSIS — D631 Anemia in chronic kidney disease: Secondary | ICD-10-CM | POA: Diagnosis not present

## 2018-10-15 DIAGNOSIS — E875 Hyperkalemia: Secondary | ICD-10-CM | POA: Diagnosis not present

## 2018-10-15 DIAGNOSIS — C9000 Multiple myeloma not having achieved remission: Secondary | ICD-10-CM | POA: Diagnosis not present

## 2018-10-15 DIAGNOSIS — N189 Chronic kidney disease, unspecified: Secondary | ICD-10-CM | POA: Diagnosis not present

## 2018-10-15 DIAGNOSIS — D472 Monoclonal gammopathy: Secondary | ICD-10-CM | POA: Diagnosis not present

## 2018-11-19 DIAGNOSIS — R05 Cough: Secondary | ICD-10-CM | POA: Diagnosis not present

## 2018-11-19 DIAGNOSIS — F0150 Vascular dementia without behavioral disturbance: Secondary | ICD-10-CM | POA: Diagnosis not present

## 2018-11-22 DIAGNOSIS — R2689 Other abnormalities of gait and mobility: Secondary | ICD-10-CM | POA: Diagnosis not present

## 2018-11-22 DIAGNOSIS — S8002XA Contusion of left knee, initial encounter: Secondary | ICD-10-CM | POA: Diagnosis not present

## 2018-11-22 DIAGNOSIS — M25561 Pain in right knee: Secondary | ICD-10-CM | POA: Diagnosis not present

## 2018-11-22 DIAGNOSIS — S0083XA Contusion of other part of head, initial encounter: Secondary | ICD-10-CM | POA: Diagnosis not present

## 2018-12-04 DIAGNOSIS — F0150 Vascular dementia without behavioral disturbance: Secondary | ICD-10-CM | POA: Diagnosis not present

## 2018-12-04 DIAGNOSIS — R509 Fever, unspecified: Secondary | ICD-10-CM | POA: Diagnosis not present

## 2018-12-04 DIAGNOSIS — R05 Cough: Secondary | ICD-10-CM | POA: Diagnosis not present

## 2018-12-04 DIAGNOSIS — R0989 Other specified symptoms and signs involving the circulatory and respiratory systems: Secondary | ICD-10-CM | POA: Diagnosis not present

## 2018-12-12 DIAGNOSIS — I10 Essential (primary) hypertension: Secondary | ICD-10-CM | POA: Diagnosis not present

## 2018-12-12 DIAGNOSIS — I2510 Atherosclerotic heart disease of native coronary artery without angina pectoris: Secondary | ICD-10-CM | POA: Diagnosis not present

## 2018-12-12 DIAGNOSIS — D509 Iron deficiency anemia, unspecified: Secondary | ICD-10-CM | POA: Diagnosis not present

## 2018-12-12 DIAGNOSIS — E785 Hyperlipidemia, unspecified: Secondary | ICD-10-CM | POA: Diagnosis not present

## 2018-12-13 DIAGNOSIS — E039 Hypothyroidism, unspecified: Secondary | ICD-10-CM | POA: Diagnosis not present

## 2018-12-13 DIAGNOSIS — N179 Acute kidney failure, unspecified: Secondary | ICD-10-CM | POA: Diagnosis not present

## 2018-12-13 DIAGNOSIS — I10 Essential (primary) hypertension: Secondary | ICD-10-CM | POA: Diagnosis not present

## 2018-12-13 DIAGNOSIS — D630 Anemia in neoplastic disease: Secondary | ICD-10-CM | POA: Diagnosis not present

## 2018-12-20 DIAGNOSIS — I5032 Chronic diastolic (congestive) heart failure: Secondary | ICD-10-CM | POA: Diagnosis not present

## 2018-12-20 DIAGNOSIS — F0150 Vascular dementia without behavioral disturbance: Secondary | ICD-10-CM | POA: Diagnosis not present

## 2018-12-20 DIAGNOSIS — R05 Cough: Secondary | ICD-10-CM | POA: Diagnosis not present

## 2019-01-02 DIAGNOSIS — I10 Essential (primary) hypertension: Secondary | ICD-10-CM | POA: Diagnosis not present

## 2019-01-02 DIAGNOSIS — D649 Anemia, unspecified: Secondary | ICD-10-CM | POA: Diagnosis not present

## 2019-01-07 DIAGNOSIS — L299 Pruritus, unspecified: Secondary | ICD-10-CM | POA: Diagnosis not present

## 2019-01-07 DIAGNOSIS — F0150 Vascular dementia without behavioral disturbance: Secondary | ICD-10-CM | POA: Diagnosis not present

## 2019-01-07 DIAGNOSIS — R21 Rash and other nonspecific skin eruption: Secondary | ICD-10-CM | POA: Diagnosis not present

## 2019-01-15 DIAGNOSIS — N390 Urinary tract infection, site not specified: Secondary | ICD-10-CM | POA: Diagnosis not present

## 2019-01-24 DIAGNOSIS — R5381 Other malaise: Secondary | ICD-10-CM | POA: Diagnosis not present

## 2019-01-24 DIAGNOSIS — R0902 Hypoxemia: Secondary | ICD-10-CM | POA: Diagnosis not present

## 2019-01-24 DIAGNOSIS — F330 Major depressive disorder, recurrent, mild: Secondary | ICD-10-CM | POA: Diagnosis not present

## 2019-01-24 DIAGNOSIS — F0390 Unspecified dementia without behavioral disturbance: Secondary | ICD-10-CM | POA: Diagnosis not present

## 2019-01-25 DIAGNOSIS — F330 Major depressive disorder, recurrent, mild: Secondary | ICD-10-CM | POA: Diagnosis not present

## 2019-01-25 DIAGNOSIS — M25571 Pain in right ankle and joints of right foot: Secondary | ICD-10-CM | POA: Diagnosis not present

## 2019-01-25 DIAGNOSIS — L989 Disorder of the skin and subcutaneous tissue, unspecified: Secondary | ICD-10-CM | POA: Diagnosis not present

## 2019-01-25 DIAGNOSIS — F0150 Vascular dementia without behavioral disturbance: Secondary | ICD-10-CM | POA: Diagnosis not present

## 2019-02-13 DIAGNOSIS — R112 Nausea with vomiting, unspecified: Secondary | ICD-10-CM | POA: Diagnosis not present

## 2019-02-13 DIAGNOSIS — F0390 Unspecified dementia without behavioral disturbance: Secondary | ICD-10-CM | POA: Diagnosis not present

## 2019-02-13 DIAGNOSIS — R531 Weakness: Secondary | ICD-10-CM | POA: Diagnosis not present

## 2019-02-18 DIAGNOSIS — F0150 Vascular dementia without behavioral disturbance: Secondary | ICD-10-CM | POA: Diagnosis not present

## 2019-02-18 DIAGNOSIS — F330 Major depressive disorder, recurrent, mild: Secondary | ICD-10-CM | POA: Diagnosis not present

## 2019-02-18 DIAGNOSIS — R5381 Other malaise: Secondary | ICD-10-CM | POA: Diagnosis not present

## 2019-02-22 DIAGNOSIS — N390 Urinary tract infection, site not specified: Secondary | ICD-10-CM | POA: Diagnosis not present

## 2019-03-03 DIAGNOSIS — R918 Other nonspecific abnormal finding of lung field: Secondary | ICD-10-CM | POA: Diagnosis not present

## 2019-03-03 DIAGNOSIS — E039 Hypothyroidism, unspecified: Secondary | ICD-10-CM | POA: Diagnosis not present

## 2019-03-03 DIAGNOSIS — D638 Anemia in other chronic diseases classified elsewhere: Secondary | ICD-10-CM

## 2019-03-03 DIAGNOSIS — D472 Monoclonal gammopathy: Secondary | ICD-10-CM | POA: Diagnosis not present

## 2019-03-03 DIAGNOSIS — R0902 Hypoxemia: Secondary | ICD-10-CM | POA: Diagnosis not present

## 2019-03-03 DIAGNOSIS — I252 Old myocardial infarction: Secondary | ICD-10-CM | POA: Diagnosis not present

## 2019-03-03 DIAGNOSIS — N179 Acute kidney failure, unspecified: Secondary | ICD-10-CM | POA: Diagnosis not present

## 2019-03-03 DIAGNOSIS — N184 Chronic kidney disease, stage 4 (severe): Secondary | ICD-10-CM | POA: Diagnosis not present

## 2019-03-03 DIAGNOSIS — R29810 Facial weakness: Secondary | ICD-10-CM | POA: Diagnosis not present

## 2019-03-03 DIAGNOSIS — F418 Other specified anxiety disorders: Secondary | ICD-10-CM | POA: Diagnosis not present

## 2019-03-03 DIAGNOSIS — D631 Anemia in chronic kidney disease: Secondary | ICD-10-CM | POA: Diagnosis not present

## 2019-03-03 DIAGNOSIS — I6930 Unspecified sequelae of cerebral infarction: Secondary | ICD-10-CM | POA: Diagnosis not present

## 2019-03-03 DIAGNOSIS — J180 Bronchopneumonia, unspecified organism: Secondary | ICD-10-CM | POA: Diagnosis not present

## 2019-03-03 DIAGNOSIS — I10 Essential (primary) hypertension: Secondary | ICD-10-CM | POA: Diagnosis not present

## 2019-03-03 DIAGNOSIS — G459 Transient cerebral ischemic attack, unspecified: Secondary | ICD-10-CM | POA: Diagnosis not present

## 2019-03-03 DIAGNOSIS — Z03818 Encounter for observation for suspected exposure to other biological agents ruled out: Secondary | ICD-10-CM | POA: Diagnosis not present

## 2019-03-03 DIAGNOSIS — I129 Hypertensive chronic kidney disease with stage 1 through stage 4 chronic kidney disease, or unspecified chronic kidney disease: Secondary | ICD-10-CM | POA: Diagnosis not present

## 2019-03-03 DIAGNOSIS — G9389 Other specified disorders of brain: Secondary | ICD-10-CM | POA: Diagnosis not present

## 2019-03-03 DIAGNOSIS — K219 Gastro-esophageal reflux disease without esophagitis: Secondary | ICD-10-CM | POA: Diagnosis not present

## 2019-03-03 DIAGNOSIS — R0602 Shortness of breath: Secondary | ICD-10-CM | POA: Diagnosis not present

## 2019-03-03 DIAGNOSIS — J189 Pneumonia, unspecified organism: Secondary | ICD-10-CM | POA: Diagnosis not present

## 2019-03-03 DIAGNOSIS — R531 Weakness: Secondary | ICD-10-CM | POA: Diagnosis not present

## 2019-03-03 DIAGNOSIS — I2510 Atherosclerotic heart disease of native coronary artery without angina pectoris: Secondary | ICD-10-CM | POA: Diagnosis not present

## 2019-03-03 DIAGNOSIS — E785 Hyperlipidemia, unspecified: Secondary | ICD-10-CM | POA: Diagnosis not present

## 2019-03-04 DIAGNOSIS — E785 Hyperlipidemia, unspecified: Secondary | ICD-10-CM | POA: Diagnosis not present

## 2019-03-04 DIAGNOSIS — D472 Monoclonal gammopathy: Secondary | ICD-10-CM | POA: Diagnosis not present

## 2019-03-04 DIAGNOSIS — Z03818 Encounter for observation for suspected exposure to other biological agents ruled out: Secondary | ICD-10-CM | POA: Diagnosis not present

## 2019-03-04 DIAGNOSIS — N179 Acute kidney failure, unspecified: Secondary | ICD-10-CM

## 2019-03-04 DIAGNOSIS — D638 Anemia in other chronic diseases classified elsewhere: Secondary | ICD-10-CM | POA: Diagnosis not present

## 2019-03-05 DIAGNOSIS — F0150 Vascular dementia without behavioral disturbance: Secondary | ICD-10-CM | POA: Diagnosis not present

## 2019-03-05 DIAGNOSIS — D638 Anemia in other chronic diseases classified elsewhere: Secondary | ICD-10-CM | POA: Diagnosis not present

## 2019-03-05 DIAGNOSIS — J189 Pneumonia, unspecified organism: Secondary | ICD-10-CM | POA: Diagnosis not present

## 2019-03-05 DIAGNOSIS — M6281 Muscle weakness (generalized): Secondary | ICD-10-CM | POA: Diagnosis not present

## 2019-03-07 DIAGNOSIS — J189 Pneumonia, unspecified organism: Secondary | ICD-10-CM | POA: Diagnosis not present

## 2019-03-07 DIAGNOSIS — I69398 Other sequelae of cerebral infarction: Secondary | ICD-10-CM | POA: Diagnosis not present

## 2019-03-07 DIAGNOSIS — D631 Anemia in chronic kidney disease: Secondary | ICD-10-CM | POA: Diagnosis not present

## 2019-03-07 DIAGNOSIS — I5020 Unspecified systolic (congestive) heart failure: Secondary | ICD-10-CM | POA: Diagnosis not present

## 2019-03-07 DIAGNOSIS — M6281 Muscle weakness (generalized): Secondary | ICD-10-CM | POA: Diagnosis not present

## 2019-03-07 DIAGNOSIS — D472 Monoclonal gammopathy: Secondary | ICD-10-CM | POA: Diagnosis not present

## 2019-03-07 DIAGNOSIS — I130 Hypertensive heart and chronic kidney disease with heart failure and stage 1 through stage 4 chronic kidney disease, or unspecified chronic kidney disease: Secondary | ICD-10-CM | POA: Diagnosis not present

## 2019-03-07 DIAGNOSIS — E559 Vitamin D deficiency, unspecified: Secondary | ICD-10-CM | POA: Diagnosis not present

## 2019-03-07 DIAGNOSIS — N179 Acute kidney failure, unspecified: Secondary | ICD-10-CM | POA: Diagnosis not present

## 2019-03-07 DIAGNOSIS — E039 Hypothyroidism, unspecified: Secondary | ICD-10-CM | POA: Diagnosis not present

## 2019-03-07 DIAGNOSIS — I2510 Atherosclerotic heart disease of native coronary artery without angina pectoris: Secondary | ICD-10-CM | POA: Diagnosis not present

## 2019-03-07 DIAGNOSIS — F0150 Vascular dementia without behavioral disturbance: Secondary | ICD-10-CM | POA: Diagnosis not present

## 2019-03-07 DIAGNOSIS — F330 Major depressive disorder, recurrent, mild: Secondary | ICD-10-CM | POA: Diagnosis not present

## 2019-03-07 DIAGNOSIS — N184 Chronic kidney disease, stage 4 (severe): Secondary | ICD-10-CM | POA: Diagnosis not present

## 2019-03-07 DIAGNOSIS — G603 Idiopathic progressive neuropathy: Secondary | ICD-10-CM | POA: Diagnosis not present

## 2019-03-07 DIAGNOSIS — D638 Anemia in other chronic diseases classified elsewhere: Secondary | ICD-10-CM | POA: Diagnosis not present

## 2019-05-03 DIAGNOSIS — M6281 Muscle weakness (generalized): Secondary | ICD-10-CM | POA: Diagnosis not present

## 2019-05-03 DIAGNOSIS — E039 Hypothyroidism, unspecified: Secondary | ICD-10-CM | POA: Diagnosis not present

## 2019-05-03 DIAGNOSIS — I2510 Atherosclerotic heart disease of native coronary artery without angina pectoris: Secondary | ICD-10-CM | POA: Diagnosis not present

## 2019-05-03 DIAGNOSIS — I69398 Other sequelae of cerebral infarction: Secondary | ICD-10-CM | POA: Diagnosis not present

## 2019-05-03 DIAGNOSIS — N179 Acute kidney failure, unspecified: Secondary | ICD-10-CM | POA: Diagnosis not present

## 2019-05-03 DIAGNOSIS — G603 Idiopathic progressive neuropathy: Secondary | ICD-10-CM | POA: Diagnosis not present

## 2019-05-03 DIAGNOSIS — F0150 Vascular dementia without behavioral disturbance: Secondary | ICD-10-CM | POA: Diagnosis not present

## 2019-05-03 DIAGNOSIS — I5020 Unspecified systolic (congestive) heart failure: Secondary | ICD-10-CM | POA: Diagnosis not present

## 2019-05-03 DIAGNOSIS — I130 Hypertensive heart and chronic kidney disease with heart failure and stage 1 through stage 4 chronic kidney disease, or unspecified chronic kidney disease: Secondary | ICD-10-CM | POA: Diagnosis not present

## 2019-05-03 DIAGNOSIS — D631 Anemia in chronic kidney disease: Secondary | ICD-10-CM | POA: Diagnosis not present

## 2019-05-03 DIAGNOSIS — E559 Vitamin D deficiency, unspecified: Secondary | ICD-10-CM | POA: Diagnosis not present

## 2019-05-03 DIAGNOSIS — F330 Major depressive disorder, recurrent, mild: Secondary | ICD-10-CM | POA: Diagnosis not present

## 2019-05-03 DIAGNOSIS — D472 Monoclonal gammopathy: Secondary | ICD-10-CM | POA: Diagnosis not present

## 2019-05-03 DIAGNOSIS — J189 Pneumonia, unspecified organism: Secondary | ICD-10-CM | POA: Diagnosis not present

## 2019-05-03 DIAGNOSIS — N184 Chronic kidney disease, stage 4 (severe): Secondary | ICD-10-CM | POA: Diagnosis not present

## 2019-05-03 DIAGNOSIS — D638 Anemia in other chronic diseases classified elsewhere: Secondary | ICD-10-CM | POA: Diagnosis not present

## 2019-05-22 DIAGNOSIS — R54 Age-related physical debility: Secondary | ICD-10-CM | POA: Diagnosis not present

## 2019-05-22 DIAGNOSIS — F0151 Vascular dementia with behavioral disturbance: Secondary | ICD-10-CM | POA: Diagnosis not present

## 2019-05-22 DIAGNOSIS — L89312 Pressure ulcer of right buttock, stage 2: Secondary | ICD-10-CM | POA: Diagnosis not present

## 2019-05-23 DIAGNOSIS — Z23 Encounter for immunization: Secondary | ICD-10-CM | POA: Diagnosis not present

## 2019-05-24 DIAGNOSIS — K5900 Constipation, unspecified: Secondary | ICD-10-CM | POA: Diagnosis not present

## 2019-05-24 DIAGNOSIS — F0150 Vascular dementia without behavioral disturbance: Secondary | ICD-10-CM | POA: Diagnosis not present

## 2019-05-29 DIAGNOSIS — N184 Chronic kidney disease, stage 4 (severe): Secondary | ICD-10-CM | POA: Diagnosis not present

## 2019-05-29 DIAGNOSIS — E785 Hyperlipidemia, unspecified: Secondary | ICD-10-CM | POA: Diagnosis not present

## 2019-05-29 DIAGNOSIS — E669 Obesity, unspecified: Secondary | ICD-10-CM | POA: Diagnosis not present

## 2019-05-29 DIAGNOSIS — I129 Hypertensive chronic kidney disease with stage 1 through stage 4 chronic kidney disease, or unspecified chronic kidney disease: Secondary | ICD-10-CM | POA: Diagnosis not present

## 2019-05-29 DIAGNOSIS — Z79899 Other long term (current) drug therapy: Secondary | ICD-10-CM | POA: Diagnosis not present

## 2019-05-29 DIAGNOSIS — N390 Urinary tract infection, site not specified: Secondary | ICD-10-CM | POA: Diagnosis not present

## 2019-05-31 DIAGNOSIS — F0151 Vascular dementia with behavioral disturbance: Secondary | ICD-10-CM | POA: Diagnosis not present

## 2019-05-31 DIAGNOSIS — N390 Urinary tract infection, site not specified: Secondary | ICD-10-CM | POA: Diagnosis not present

## 2019-05-31 DIAGNOSIS — R531 Weakness: Secondary | ICD-10-CM | POA: Diagnosis not present

## 2019-05-31 DIAGNOSIS — R062 Wheezing: Secondary | ICD-10-CM | POA: Diagnosis not present

## 2019-06-03 DIAGNOSIS — R4182 Altered mental status, unspecified: Secondary | ICD-10-CM | POA: Diagnosis not present

## 2019-06-03 DIAGNOSIS — B9620 Unspecified Escherichia coli [E. coli] as the cause of diseases classified elsewhere: Secondary | ICD-10-CM | POA: Diagnosis not present

## 2019-06-03 DIAGNOSIS — N390 Urinary tract infection, site not specified: Secondary | ICD-10-CM | POA: Diagnosis not present

## 2019-06-03 DIAGNOSIS — E039 Hypothyroidism, unspecified: Secondary | ICD-10-CM | POA: Diagnosis not present

## 2019-06-06 DIAGNOSIS — R451 Restlessness and agitation: Secondary | ICD-10-CM | POA: Diagnosis not present

## 2019-06-06 DIAGNOSIS — Z87440 Personal history of urinary (tract) infections: Secondary | ICD-10-CM | POA: Diagnosis not present

## 2019-06-06 DIAGNOSIS — F0150 Vascular dementia without behavioral disturbance: Secondary | ICD-10-CM | POA: Diagnosis not present

## 2019-06-06 DIAGNOSIS — N390 Urinary tract infection, site not specified: Secondary | ICD-10-CM | POA: Diagnosis not present

## 2019-07-17 DIAGNOSIS — R54 Age-related physical debility: Secondary | ICD-10-CM | POA: Diagnosis not present

## 2019-07-17 DIAGNOSIS — Z20828 Contact with and (suspected) exposure to other viral communicable diseases: Secondary | ICD-10-CM | POA: Diagnosis not present

## 2019-07-24 DIAGNOSIS — R54 Age-related physical debility: Secondary | ICD-10-CM | POA: Diagnosis not present

## 2019-07-24 DIAGNOSIS — Z20828 Contact with and (suspected) exposure to other viral communicable diseases: Secondary | ICD-10-CM | POA: Diagnosis not present

## 2019-07-31 DIAGNOSIS — R54 Age-related physical debility: Secondary | ICD-10-CM | POA: Diagnosis not present

## 2019-07-31 DIAGNOSIS — Z20828 Contact with and (suspected) exposure to other viral communicable diseases: Secondary | ICD-10-CM | POA: Diagnosis not present

## 2019-08-07 DIAGNOSIS — Z20828 Contact with and (suspected) exposure to other viral communicable diseases: Secondary | ICD-10-CM | POA: Diagnosis not present

## 2019-08-07 DIAGNOSIS — R54 Age-related physical debility: Secondary | ICD-10-CM | POA: Diagnosis not present

## 2019-08-10 DIAGNOSIS — S0990XA Unspecified injury of head, initial encounter: Secondary | ICD-10-CM | POA: Diagnosis not present

## 2019-08-10 DIAGNOSIS — R52 Pain, unspecified: Secondary | ICD-10-CM | POA: Diagnosis not present

## 2019-08-10 DIAGNOSIS — M542 Cervicalgia: Secondary | ICD-10-CM | POA: Diagnosis not present

## 2019-08-10 DIAGNOSIS — M79604 Pain in right leg: Secondary | ICD-10-CM | POA: Diagnosis not present

## 2019-08-10 DIAGNOSIS — S4991XA Unspecified injury of right shoulder and upper arm, initial encounter: Secondary | ICD-10-CM | POA: Diagnosis not present

## 2019-08-10 DIAGNOSIS — M545 Low back pain: Secondary | ICD-10-CM | POA: Diagnosis not present

## 2019-08-10 DIAGNOSIS — R918 Other nonspecific abnormal finding of lung field: Secondary | ICD-10-CM | POA: Diagnosis not present

## 2019-08-10 DIAGNOSIS — F0390 Unspecified dementia without behavioral disturbance: Secondary | ICD-10-CM | POA: Diagnosis not present

## 2019-08-10 DIAGNOSIS — S79911A Unspecified injury of right hip, initial encounter: Secondary | ICD-10-CM | POA: Diagnosis not present

## 2019-08-10 DIAGNOSIS — S40021A Contusion of right upper arm, initial encounter: Secondary | ICD-10-CM | POA: Diagnosis not present

## 2019-08-10 DIAGNOSIS — S299XXA Unspecified injury of thorax, initial encounter: Secondary | ICD-10-CM | POA: Diagnosis not present

## 2019-08-10 DIAGNOSIS — M25551 Pain in right hip: Secondary | ICD-10-CM | POA: Diagnosis not present

## 2019-08-10 DIAGNOSIS — M79603 Pain in arm, unspecified: Secondary | ICD-10-CM | POA: Diagnosis not present

## 2019-08-10 DIAGNOSIS — R0902 Hypoxemia: Secondary | ICD-10-CM | POA: Diagnosis not present

## 2019-08-10 DIAGNOSIS — S0003XA Contusion of scalp, initial encounter: Secondary | ICD-10-CM | POA: Diagnosis not present

## 2019-08-10 DIAGNOSIS — R279 Unspecified lack of coordination: Secondary | ICD-10-CM | POA: Diagnosis not present

## 2019-08-10 DIAGNOSIS — S199XXA Unspecified injury of neck, initial encounter: Secondary | ICD-10-CM | POA: Diagnosis not present

## 2019-08-10 DIAGNOSIS — S51811A Laceration without foreign body of right forearm, initial encounter: Secondary | ICD-10-CM | POA: Diagnosis not present

## 2019-08-10 DIAGNOSIS — R55 Syncope and collapse: Secondary | ICD-10-CM | POA: Diagnosis not present

## 2019-08-10 DIAGNOSIS — Z743 Need for continuous supervision: Secondary | ICD-10-CM | POA: Diagnosis not present

## 2019-08-10 DIAGNOSIS — R58 Hemorrhage, not elsewhere classified: Secondary | ICD-10-CM | POA: Diagnosis not present

## 2019-08-11 DIAGNOSIS — R279 Unspecified lack of coordination: Secondary | ICD-10-CM | POA: Diagnosis not present

## 2019-08-11 DIAGNOSIS — Z743 Need for continuous supervision: Secondary | ICD-10-CM | POA: Diagnosis not present

## 2019-08-11 DIAGNOSIS — R410 Disorientation, unspecified: Secondary | ICD-10-CM | POA: Diagnosis not present

## 2019-08-11 DIAGNOSIS — R52 Pain, unspecified: Secondary | ICD-10-CM | POA: Diagnosis not present

## 2019-08-11 DIAGNOSIS — W19XXXA Unspecified fall, initial encounter: Secondary | ICD-10-CM | POA: Diagnosis not present

## 2019-08-11 DIAGNOSIS — S3993XA Unspecified injury of pelvis, initial encounter: Secondary | ICD-10-CM | POA: Diagnosis not present

## 2019-08-11 DIAGNOSIS — M25551 Pain in right hip: Secondary | ICD-10-CM | POA: Diagnosis not present

## 2019-08-12 DIAGNOSIS — F0150 Vascular dementia without behavioral disturbance: Secondary | ICD-10-CM | POA: Diagnosis not present

## 2019-08-12 DIAGNOSIS — M545 Low back pain: Secondary | ICD-10-CM | POA: Diagnosis not present

## 2019-08-12 DIAGNOSIS — R5381 Other malaise: Secondary | ICD-10-CM | POA: Diagnosis not present

## 2019-08-12 DIAGNOSIS — M25551 Pain in right hip: Secondary | ICD-10-CM | POA: Diagnosis not present

## 2019-08-14 DIAGNOSIS — R54 Age-related physical debility: Secondary | ICD-10-CM | POA: Diagnosis not present

## 2019-08-14 DIAGNOSIS — Z20828 Contact with and (suspected) exposure to other viral communicable diseases: Secondary | ICD-10-CM | POA: Diagnosis not present

## 2019-08-21 DIAGNOSIS — Z20828 Contact with and (suspected) exposure to other viral communicable diseases: Secondary | ICD-10-CM | POA: Diagnosis not present

## 2019-08-21 DIAGNOSIS — R54 Age-related physical debility: Secondary | ICD-10-CM | POA: Diagnosis not present

## 2019-08-22 DIAGNOSIS — I2510 Atherosclerotic heart disease of native coronary artery without angina pectoris: Secondary | ICD-10-CM | POA: Diagnosis not present

## 2019-08-22 DIAGNOSIS — D472 Monoclonal gammopathy: Secondary | ICD-10-CM | POA: Diagnosis not present

## 2019-08-22 DIAGNOSIS — F419 Anxiety disorder, unspecified: Secondary | ICD-10-CM | POA: Diagnosis not present

## 2019-08-22 DIAGNOSIS — D631 Anemia in chronic kidney disease: Secondary | ICD-10-CM | POA: Diagnosis not present

## 2019-08-22 DIAGNOSIS — N184 Chronic kidney disease, stage 4 (severe): Secondary | ICD-10-CM | POA: Diagnosis not present

## 2019-08-22 DIAGNOSIS — M545 Low back pain: Secondary | ICD-10-CM | POA: Diagnosis not present

## 2019-08-22 DIAGNOSIS — R911 Solitary pulmonary nodule: Secondary | ICD-10-CM | POA: Diagnosis not present

## 2019-08-22 DIAGNOSIS — F330 Major depressive disorder, recurrent, mild: Secondary | ICD-10-CM | POA: Diagnosis not present

## 2019-08-22 DIAGNOSIS — Z9181 History of falling: Secondary | ICD-10-CM | POA: Diagnosis not present

## 2019-08-22 DIAGNOSIS — I130 Hypertensive heart and chronic kidney disease with heart failure and stage 1 through stage 4 chronic kidney disease, or unspecified chronic kidney disease: Secondary | ICD-10-CM | POA: Diagnosis not present

## 2019-08-22 DIAGNOSIS — N390 Urinary tract infection, site not specified: Secondary | ICD-10-CM | POA: Diagnosis not present

## 2019-08-22 DIAGNOSIS — G603 Idiopathic progressive neuropathy: Secondary | ICD-10-CM | POA: Diagnosis not present

## 2019-08-22 DIAGNOSIS — Z7982 Long term (current) use of aspirin: Secondary | ICD-10-CM | POA: Diagnosis not present

## 2019-08-22 DIAGNOSIS — F0150 Vascular dementia without behavioral disturbance: Secondary | ICD-10-CM | POA: Diagnosis not present

## 2019-08-22 DIAGNOSIS — I5020 Unspecified systolic (congestive) heart failure: Secondary | ICD-10-CM | POA: Diagnosis not present

## 2019-08-22 DIAGNOSIS — I69351 Hemiplegia and hemiparesis following cerebral infarction affecting right dominant side: Secondary | ICD-10-CM | POA: Diagnosis not present

## 2019-08-28 DIAGNOSIS — R54 Age-related physical debility: Secondary | ICD-10-CM | POA: Diagnosis not present

## 2019-08-28 DIAGNOSIS — Z20828 Contact with and (suspected) exposure to other viral communicable diseases: Secondary | ICD-10-CM | POA: Diagnosis not present

## 2019-09-04 DIAGNOSIS — R54 Age-related physical debility: Secondary | ICD-10-CM | POA: Diagnosis not present

## 2019-09-04 DIAGNOSIS — Z20828 Contact with and (suspected) exposure to other viral communicable diseases: Secondary | ICD-10-CM | POA: Diagnosis not present

## 2019-09-11 DIAGNOSIS — Z20828 Contact with and (suspected) exposure to other viral communicable diseases: Secondary | ICD-10-CM | POA: Diagnosis not present

## 2019-09-11 DIAGNOSIS — R54 Age-related physical debility: Secondary | ICD-10-CM | POA: Diagnosis not present

## 2019-09-18 DIAGNOSIS — R54 Age-related physical debility: Secondary | ICD-10-CM | POA: Diagnosis not present

## 2019-09-18 DIAGNOSIS — Z20828 Contact with and (suspected) exposure to other viral communicable diseases: Secondary | ICD-10-CM | POA: Diagnosis not present

## 2019-09-21 DIAGNOSIS — G603 Idiopathic progressive neuropathy: Secondary | ICD-10-CM | POA: Diagnosis not present

## 2019-09-21 DIAGNOSIS — I2510 Atherosclerotic heart disease of native coronary artery without angina pectoris: Secondary | ICD-10-CM | POA: Diagnosis not present

## 2019-09-21 DIAGNOSIS — D472 Monoclonal gammopathy: Secondary | ICD-10-CM | POA: Diagnosis not present

## 2019-09-21 DIAGNOSIS — M545 Low back pain: Secondary | ICD-10-CM | POA: Diagnosis not present

## 2019-09-21 DIAGNOSIS — I69351 Hemiplegia and hemiparesis following cerebral infarction affecting right dominant side: Secondary | ICD-10-CM | POA: Diagnosis not present

## 2019-09-21 DIAGNOSIS — F0150 Vascular dementia without behavioral disturbance: Secondary | ICD-10-CM | POA: Diagnosis not present

## 2019-09-21 DIAGNOSIS — I130 Hypertensive heart and chronic kidney disease with heart failure and stage 1 through stage 4 chronic kidney disease, or unspecified chronic kidney disease: Secondary | ICD-10-CM | POA: Diagnosis not present

## 2019-09-21 DIAGNOSIS — Z8701 Personal history of pneumonia (recurrent): Secondary | ICD-10-CM | POA: Diagnosis not present

## 2019-09-21 DIAGNOSIS — D631 Anemia in chronic kidney disease: Secondary | ICD-10-CM | POA: Diagnosis not present

## 2019-09-21 DIAGNOSIS — N184 Chronic kidney disease, stage 4 (severe): Secondary | ICD-10-CM | POA: Diagnosis not present

## 2019-09-21 DIAGNOSIS — F330 Major depressive disorder, recurrent, mild: Secondary | ICD-10-CM | POA: Diagnosis not present

## 2019-09-21 DIAGNOSIS — Z9181 History of falling: Secondary | ICD-10-CM | POA: Diagnosis not present

## 2019-09-21 DIAGNOSIS — I5020 Unspecified systolic (congestive) heart failure: Secondary | ICD-10-CM | POA: Diagnosis not present

## 2019-09-21 DIAGNOSIS — Z7982 Long term (current) use of aspirin: Secondary | ICD-10-CM | POA: Diagnosis not present

## 2019-09-21 DIAGNOSIS — R911 Solitary pulmonary nodule: Secondary | ICD-10-CM | POA: Diagnosis not present

## 2019-09-21 DIAGNOSIS — F419 Anxiety disorder, unspecified: Secondary | ICD-10-CM | POA: Diagnosis not present

## 2019-09-25 DIAGNOSIS — Z20822 Contact with and (suspected) exposure to covid-19: Secondary | ICD-10-CM | POA: Diagnosis not present

## 2019-09-25 DIAGNOSIS — R54 Age-related physical debility: Secondary | ICD-10-CM | POA: Diagnosis not present

## 2019-10-02 DIAGNOSIS — Z20828 Contact with and (suspected) exposure to other viral communicable diseases: Secondary | ICD-10-CM | POA: Diagnosis not present

## 2019-10-02 DIAGNOSIS — Z20822 Contact with and (suspected) exposure to covid-19: Secondary | ICD-10-CM | POA: Diagnosis not present

## 2019-10-02 DIAGNOSIS — R54 Age-related physical debility: Secondary | ICD-10-CM | POA: Diagnosis not present

## 2019-10-09 DIAGNOSIS — Z20822 Contact with and (suspected) exposure to covid-19: Secondary | ICD-10-CM | POA: Diagnosis not present

## 2019-10-09 DIAGNOSIS — R54 Age-related physical debility: Secondary | ICD-10-CM | POA: Diagnosis not present

## 2019-10-11 DIAGNOSIS — R5381 Other malaise: Secondary | ICD-10-CM | POA: Diagnosis not present

## 2019-10-11 DIAGNOSIS — I517 Cardiomegaly: Secondary | ICD-10-CM | POA: Diagnosis not present

## 2019-10-11 DIAGNOSIS — J189 Pneumonia, unspecified organism: Secondary | ICD-10-CM | POA: Diagnosis not present

## 2019-10-11 DIAGNOSIS — J9601 Acute respiratory failure with hypoxia: Secondary | ICD-10-CM | POA: Diagnosis not present

## 2019-10-11 DIAGNOSIS — R0989 Other specified symptoms and signs involving the circulatory and respiratory systems: Secondary | ICD-10-CM | POA: Diagnosis not present

## 2019-10-11 DIAGNOSIS — F0150 Vascular dementia without behavioral disturbance: Secondary | ICD-10-CM | POA: Diagnosis not present

## 2019-10-16 DIAGNOSIS — R54 Age-related physical debility: Secondary | ICD-10-CM | POA: Diagnosis not present

## 2019-10-16 DIAGNOSIS — Z20822 Contact with and (suspected) exposure to covid-19: Secondary | ICD-10-CM | POA: Diagnosis not present

## 2019-10-21 DIAGNOSIS — N184 Chronic kidney disease, stage 4 (severe): Secondary | ICD-10-CM | POA: Diagnosis not present

## 2019-10-21 DIAGNOSIS — F419 Anxiety disorder, unspecified: Secondary | ICD-10-CM | POA: Diagnosis not present

## 2019-10-21 DIAGNOSIS — I5020 Unspecified systolic (congestive) heart failure: Secondary | ICD-10-CM | POA: Diagnosis not present

## 2019-10-21 DIAGNOSIS — Z7982 Long term (current) use of aspirin: Secondary | ICD-10-CM | POA: Diagnosis not present

## 2019-10-21 DIAGNOSIS — Z9181 History of falling: Secondary | ICD-10-CM | POA: Diagnosis not present

## 2019-10-21 DIAGNOSIS — I69351 Hemiplegia and hemiparesis following cerebral infarction affecting right dominant side: Secondary | ICD-10-CM | POA: Diagnosis not present

## 2019-10-21 DIAGNOSIS — G603 Idiopathic progressive neuropathy: Secondary | ICD-10-CM | POA: Diagnosis not present

## 2019-10-21 DIAGNOSIS — F0150 Vascular dementia without behavioral disturbance: Secondary | ICD-10-CM | POA: Diagnosis not present

## 2019-10-21 DIAGNOSIS — R911 Solitary pulmonary nodule: Secondary | ICD-10-CM | POA: Diagnosis not present

## 2019-10-21 DIAGNOSIS — D631 Anemia in chronic kidney disease: Secondary | ICD-10-CM | POA: Diagnosis not present

## 2019-10-21 DIAGNOSIS — I130 Hypertensive heart and chronic kidney disease with heart failure and stage 1 through stage 4 chronic kidney disease, or unspecified chronic kidney disease: Secondary | ICD-10-CM | POA: Diagnosis not present

## 2019-10-21 DIAGNOSIS — D472 Monoclonal gammopathy: Secondary | ICD-10-CM | POA: Diagnosis not present

## 2019-10-21 DIAGNOSIS — I2510 Atherosclerotic heart disease of native coronary artery without angina pectoris: Secondary | ICD-10-CM | POA: Diagnosis not present

## 2019-10-21 DIAGNOSIS — M545 Low back pain: Secondary | ICD-10-CM | POA: Diagnosis not present

## 2019-10-21 DIAGNOSIS — F330 Major depressive disorder, recurrent, mild: Secondary | ICD-10-CM | POA: Diagnosis not present

## 2019-10-21 DIAGNOSIS — Z8701 Personal history of pneumonia (recurrent): Secondary | ICD-10-CM | POA: Diagnosis not present

## 2019-10-23 DIAGNOSIS — R54 Age-related physical debility: Secondary | ICD-10-CM | POA: Diagnosis not present

## 2019-10-23 DIAGNOSIS — Z20822 Contact with and (suspected) exposure to covid-19: Secondary | ICD-10-CM | POA: Diagnosis not present

## 2019-10-30 DIAGNOSIS — Z20822 Contact with and (suspected) exposure to covid-19: Secondary | ICD-10-CM | POA: Diagnosis not present

## 2019-10-30 DIAGNOSIS — R54 Age-related physical debility: Secondary | ICD-10-CM | POA: Diagnosis not present

## 2019-11-04 DIAGNOSIS — E039 Hypothyroidism, unspecified: Secondary | ICD-10-CM | POA: Diagnosis not present

## 2019-11-04 DIAGNOSIS — N184 Chronic kidney disease, stage 4 (severe): Secondary | ICD-10-CM | POA: Diagnosis not present

## 2019-11-04 DIAGNOSIS — I129 Hypertensive chronic kidney disease with stage 1 through stage 4 chronic kidney disease, or unspecified chronic kidney disease: Secondary | ICD-10-CM | POA: Diagnosis not present

## 2019-11-04 DIAGNOSIS — D631 Anemia in chronic kidney disease: Secondary | ICD-10-CM | POA: Diagnosis not present

## 2019-11-05 DIAGNOSIS — E038 Other specified hypothyroidism: Secondary | ICD-10-CM | POA: Diagnosis not present

## 2019-11-05 DIAGNOSIS — I129 Hypertensive chronic kidney disease with stage 1 through stage 4 chronic kidney disease, or unspecified chronic kidney disease: Secondary | ICD-10-CM | POA: Diagnosis not present

## 2019-11-05 DIAGNOSIS — Z131 Encounter for screening for diabetes mellitus: Secondary | ICD-10-CM | POA: Diagnosis not present

## 2019-11-05 DIAGNOSIS — D631 Anemia in chronic kidney disease: Secondary | ICD-10-CM | POA: Diagnosis not present

## 2019-11-06 DIAGNOSIS — R54 Age-related physical debility: Secondary | ICD-10-CM | POA: Diagnosis not present

## 2019-11-06 DIAGNOSIS — Z20822 Contact with and (suspected) exposure to covid-19: Secondary | ICD-10-CM | POA: Diagnosis not present

## 2019-11-08 DIAGNOSIS — R0902 Hypoxemia: Secondary | ICD-10-CM | POA: Diagnosis not present

## 2019-11-08 DIAGNOSIS — S0990XA Unspecified injury of head, initial encounter: Secondary | ICD-10-CM | POA: Diagnosis not present

## 2019-11-08 DIAGNOSIS — S299XXA Unspecified injury of thorax, initial encounter: Secondary | ICD-10-CM | POA: Diagnosis not present

## 2019-11-08 DIAGNOSIS — E860 Dehydration: Secondary | ICD-10-CM | POA: Diagnosis not present

## 2019-11-08 DIAGNOSIS — N189 Chronic kidney disease, unspecified: Secondary | ICD-10-CM | POA: Diagnosis not present

## 2019-11-08 DIAGNOSIS — I959 Hypotension, unspecified: Secondary | ICD-10-CM | POA: Diagnosis not present

## 2019-11-08 DIAGNOSIS — I248 Other forms of acute ischemic heart disease: Secondary | ICD-10-CM | POA: Diagnosis not present

## 2019-11-08 DIAGNOSIS — I129 Hypertensive chronic kidney disease with stage 1 through stage 4 chronic kidney disease, or unspecified chronic kidney disease: Secondary | ICD-10-CM | POA: Diagnosis not present

## 2019-11-08 DIAGNOSIS — R1111 Vomiting without nausea: Secondary | ICD-10-CM | POA: Diagnosis not present

## 2019-11-08 DIAGNOSIS — I10 Essential (primary) hypertension: Secondary | ICD-10-CM | POA: Diagnosis not present

## 2019-11-08 DIAGNOSIS — R7989 Other specified abnormal findings of blood chemistry: Secondary | ICD-10-CM | POA: Diagnosis not present

## 2019-11-08 DIAGNOSIS — R52 Pain, unspecified: Secondary | ICD-10-CM | POA: Diagnosis not present

## 2019-11-08 DIAGNOSIS — R4182 Altered mental status, unspecified: Secondary | ICD-10-CM | POA: Diagnosis not present

## 2019-11-08 DIAGNOSIS — S0003XA Contusion of scalp, initial encounter: Secondary | ICD-10-CM | POA: Diagnosis not present

## 2019-11-08 DIAGNOSIS — R55 Syncope and collapse: Secondary | ICD-10-CM | POA: Diagnosis not present

## 2019-11-08 DIAGNOSIS — S3993XA Unspecified injury of pelvis, initial encounter: Secondary | ICD-10-CM | POA: Diagnosis not present

## 2019-11-08 DIAGNOSIS — W19XXXA Unspecified fall, initial encounter: Secondary | ICD-10-CM | POA: Diagnosis not present

## 2019-11-08 DIAGNOSIS — S199XXA Unspecified injury of neck, initial encounter: Secondary | ICD-10-CM | POA: Diagnosis not present

## 2019-11-09 DIAGNOSIS — E860 Dehydration: Secondary | ICD-10-CM | POA: Diagnosis not present

## 2019-11-09 DIAGNOSIS — S0990XA Unspecified injury of head, initial encounter: Secondary | ICD-10-CM | POA: Diagnosis not present

## 2019-11-09 DIAGNOSIS — I2510 Atherosclerotic heart disease of native coronary artery without angina pectoris: Secondary | ICD-10-CM | POA: Diagnosis not present

## 2019-11-09 DIAGNOSIS — S199XXA Unspecified injury of neck, initial encounter: Secondary | ICD-10-CM | POA: Diagnosis not present

## 2019-11-09 DIAGNOSIS — N184 Chronic kidney disease, stage 4 (severe): Secondary | ICD-10-CM | POA: Diagnosis not present

## 2019-11-09 DIAGNOSIS — D472 Monoclonal gammopathy: Secondary | ICD-10-CM | POA: Diagnosis not present

## 2019-11-09 DIAGNOSIS — S3993XA Unspecified injury of pelvis, initial encounter: Secondary | ICD-10-CM | POA: Diagnosis not present

## 2019-11-09 DIAGNOSIS — K219 Gastro-esophageal reflux disease without esophagitis: Secondary | ICD-10-CM | POA: Diagnosis not present

## 2019-11-09 DIAGNOSIS — I6523 Occlusion and stenosis of bilateral carotid arteries: Secondary | ICD-10-CM | POA: Diagnosis not present

## 2019-11-09 DIAGNOSIS — R55 Syncope and collapse: Secondary | ICD-10-CM | POA: Diagnosis not present

## 2019-11-09 DIAGNOSIS — I69911 Memory deficit following unspecified cerebrovascular disease: Secondary | ICD-10-CM | POA: Diagnosis not present

## 2019-11-09 DIAGNOSIS — E039 Hypothyroidism, unspecified: Secondary | ICD-10-CM | POA: Diagnosis not present

## 2019-11-09 DIAGNOSIS — S299XXA Unspecified injury of thorax, initial encounter: Secondary | ICD-10-CM | POA: Diagnosis not present

## 2019-11-09 DIAGNOSIS — R7989 Other specified abnormal findings of blood chemistry: Secondary | ICD-10-CM | POA: Diagnosis not present

## 2019-11-09 DIAGNOSIS — R410 Disorientation, unspecified: Secondary | ICD-10-CM | POA: Diagnosis not present

## 2019-11-09 DIAGNOSIS — E785 Hyperlipidemia, unspecified: Secondary | ICD-10-CM | POA: Diagnosis not present

## 2019-11-09 DIAGNOSIS — D631 Anemia in chronic kidney disease: Secondary | ICD-10-CM | POA: Diagnosis not present

## 2019-11-09 DIAGNOSIS — R4182 Altered mental status, unspecified: Secondary | ICD-10-CM | POA: Diagnosis not present

## 2019-11-09 DIAGNOSIS — Z681 Body mass index (BMI) 19 or less, adult: Secondary | ICD-10-CM | POA: Diagnosis not present

## 2019-11-09 DIAGNOSIS — I248 Other forms of acute ischemic heart disease: Secondary | ICD-10-CM | POA: Diagnosis not present

## 2019-11-09 DIAGNOSIS — I129 Hypertensive chronic kidney disease with stage 1 through stage 4 chronic kidney disease, or unspecified chronic kidney disease: Secondary | ICD-10-CM | POA: Diagnosis not present

## 2019-11-09 DIAGNOSIS — S0003XA Contusion of scalp, initial encounter: Secondary | ICD-10-CM | POA: Diagnosis not present

## 2019-11-09 DIAGNOSIS — N189 Chronic kidney disease, unspecified: Secondary | ICD-10-CM | POA: Diagnosis not present

## 2019-11-09 DIAGNOSIS — E440 Moderate protein-calorie malnutrition: Secondary | ICD-10-CM | POA: Diagnosis not present

## 2019-11-09 DIAGNOSIS — K5730 Diverticulosis of large intestine without perforation or abscess without bleeding: Secondary | ICD-10-CM | POA: Diagnosis not present

## 2019-11-09 DIAGNOSIS — I130 Hypertensive heart and chronic kidney disease with heart failure and stage 1 through stage 4 chronic kidney disease, or unspecified chronic kidney disease: Secondary | ICD-10-CM | POA: Diagnosis not present

## 2019-11-10 DIAGNOSIS — R55 Syncope and collapse: Secondary | ICD-10-CM | POA: Diagnosis not present

## 2019-11-10 DIAGNOSIS — R4182 Altered mental status, unspecified: Secondary | ICD-10-CM | POA: Diagnosis not present

## 2019-11-10 DIAGNOSIS — N189 Chronic kidney disease, unspecified: Secondary | ICD-10-CM | POA: Diagnosis not present

## 2019-11-10 DIAGNOSIS — R7989 Other specified abnormal findings of blood chemistry: Secondary | ICD-10-CM | POA: Diagnosis not present

## 2019-11-11 DIAGNOSIS — N189 Chronic kidney disease, unspecified: Secondary | ICD-10-CM | POA: Diagnosis not present

## 2019-11-11 DIAGNOSIS — R7989 Other specified abnormal findings of blood chemistry: Secondary | ICD-10-CM | POA: Diagnosis not present

## 2019-11-11 DIAGNOSIS — R4182 Altered mental status, unspecified: Secondary | ICD-10-CM | POA: Diagnosis not present

## 2019-11-11 DIAGNOSIS — R55 Syncope and collapse: Secondary | ICD-10-CM | POA: Diagnosis not present

## 2019-11-12 DIAGNOSIS — R4182 Altered mental status, unspecified: Secondary | ICD-10-CM | POA: Diagnosis not present

## 2019-11-12 DIAGNOSIS — R55 Syncope and collapse: Secondary | ICD-10-CM | POA: Diagnosis not present

## 2019-11-12 DIAGNOSIS — R7989 Other specified abnormal findings of blood chemistry: Secondary | ICD-10-CM | POA: Diagnosis not present

## 2019-11-12 DIAGNOSIS — N189 Chronic kidney disease, unspecified: Secondary | ICD-10-CM | POA: Diagnosis not present

## 2019-11-13 DIAGNOSIS — R4182 Altered mental status, unspecified: Secondary | ICD-10-CM | POA: Diagnosis not present

## 2019-11-13 DIAGNOSIS — R7989 Other specified abnormal findings of blood chemistry: Secondary | ICD-10-CM | POA: Diagnosis not present

## 2019-11-13 DIAGNOSIS — R55 Syncope and collapse: Secondary | ICD-10-CM | POA: Diagnosis not present

## 2019-11-13 DIAGNOSIS — N189 Chronic kidney disease, unspecified: Secondary | ICD-10-CM | POA: Diagnosis not present

## 2019-11-14 DIAGNOSIS — R7989 Other specified abnormal findings of blood chemistry: Secondary | ICD-10-CM | POA: Diagnosis not present

## 2019-11-14 DIAGNOSIS — R55 Syncope and collapse: Secondary | ICD-10-CM | POA: Diagnosis not present

## 2019-11-14 DIAGNOSIS — F0150 Vascular dementia without behavioral disturbance: Secondary | ICD-10-CM | POA: Diagnosis not present

## 2019-11-14 DIAGNOSIS — R4182 Altered mental status, unspecified: Secondary | ICD-10-CM | POA: Diagnosis not present

## 2019-11-19 DIAGNOSIS — D631 Anemia in chronic kidney disease: Secondary | ICD-10-CM | POA: Diagnosis not present

## 2019-11-19 DIAGNOSIS — N189 Chronic kidney disease, unspecified: Secondary | ICD-10-CM | POA: Diagnosis not present

## 2019-11-20 DIAGNOSIS — I130 Hypertensive heart and chronic kidney disease with heart failure and stage 1 through stage 4 chronic kidney disease, or unspecified chronic kidney disease: Secondary | ICD-10-CM | POA: Diagnosis not present

## 2019-11-20 DIAGNOSIS — F330 Major depressive disorder, recurrent, mild: Secondary | ICD-10-CM | POA: Diagnosis not present

## 2019-11-20 DIAGNOSIS — N184 Chronic kidney disease, stage 4 (severe): Secondary | ICD-10-CM | POA: Diagnosis not present

## 2019-11-20 DIAGNOSIS — E785 Hyperlipidemia, unspecified: Secondary | ICD-10-CM | POA: Diagnosis not present

## 2019-11-20 DIAGNOSIS — R911 Solitary pulmonary nodule: Secondary | ICD-10-CM | POA: Diagnosis not present

## 2019-11-20 DIAGNOSIS — I69351 Hemiplegia and hemiparesis following cerebral infarction affecting right dominant side: Secondary | ICD-10-CM | POA: Diagnosis not present

## 2019-11-20 DIAGNOSIS — Z9181 History of falling: Secondary | ICD-10-CM | POA: Diagnosis not present

## 2019-11-20 DIAGNOSIS — D472 Monoclonal gammopathy: Secondary | ICD-10-CM | POA: Diagnosis not present

## 2019-11-20 DIAGNOSIS — R54 Age-related physical debility: Secondary | ICD-10-CM | POA: Diagnosis not present

## 2019-11-20 DIAGNOSIS — F0150 Vascular dementia without behavioral disturbance: Secondary | ICD-10-CM | POA: Diagnosis not present

## 2019-11-20 DIAGNOSIS — Z20822 Contact with and (suspected) exposure to covid-19: Secondary | ICD-10-CM | POA: Diagnosis not present

## 2019-11-20 DIAGNOSIS — G603 Idiopathic progressive neuropathy: Secondary | ICD-10-CM | POA: Diagnosis not present

## 2019-11-20 DIAGNOSIS — M545 Low back pain: Secondary | ICD-10-CM | POA: Diagnosis not present

## 2019-11-20 DIAGNOSIS — F419 Anxiety disorder, unspecified: Secondary | ICD-10-CM | POA: Diagnosis not present

## 2019-11-20 DIAGNOSIS — I5020 Unspecified systolic (congestive) heart failure: Secondary | ICD-10-CM | POA: Diagnosis not present

## 2019-11-20 DIAGNOSIS — Z7982 Long term (current) use of aspirin: Secondary | ICD-10-CM | POA: Diagnosis not present

## 2019-11-20 DIAGNOSIS — I2510 Atherosclerotic heart disease of native coronary artery without angina pectoris: Secondary | ICD-10-CM | POA: Diagnosis not present

## 2019-11-20 DIAGNOSIS — D631 Anemia in chronic kidney disease: Secondary | ICD-10-CM | POA: Diagnosis not present

## 2019-11-22 DIAGNOSIS — M6281 Muscle weakness (generalized): Secondary | ICD-10-CM | POA: Diagnosis not present

## 2019-11-22 DIAGNOSIS — R2689 Other abnormalities of gait and mobility: Secondary | ICD-10-CM | POA: Diagnosis not present

## 2019-11-22 DIAGNOSIS — R269 Unspecified abnormalities of gait and mobility: Secondary | ICD-10-CM | POA: Diagnosis not present

## 2019-11-22 DIAGNOSIS — R1030 Lower abdominal pain, unspecified: Secondary | ICD-10-CM | POA: Diagnosis not present

## 2019-11-23 DIAGNOSIS — R109 Unspecified abdominal pain: Secondary | ICD-10-CM | POA: Diagnosis not present

## 2019-11-27 DIAGNOSIS — F0150 Vascular dementia without behavioral disturbance: Secondary | ICD-10-CM | POA: Diagnosis not present

## 2019-11-27 DIAGNOSIS — R630 Anorexia: Secondary | ICD-10-CM | POA: Diagnosis not present

## 2019-11-27 DIAGNOSIS — R627 Adult failure to thrive: Secondary | ICD-10-CM | POA: Diagnosis not present

## 2019-11-27 DIAGNOSIS — R634 Abnormal weight loss: Secondary | ICD-10-CM | POA: Diagnosis not present

## 2019-11-28 DIAGNOSIS — N390 Urinary tract infection, site not specified: Secondary | ICD-10-CM | POA: Diagnosis not present

## 2019-12-02 DIAGNOSIS — R52 Pain, unspecified: Secondary | ICD-10-CM | POA: Diagnosis not present

## 2019-12-02 DIAGNOSIS — R296 Repeated falls: Secondary | ICD-10-CM | POA: Diagnosis not present

## 2019-12-02 DIAGNOSIS — I129 Hypertensive chronic kidney disease with stage 1 through stage 4 chronic kidney disease, or unspecified chronic kidney disease: Secondary | ICD-10-CM | POA: Diagnosis not present

## 2019-12-02 DIAGNOSIS — K5730 Diverticulosis of large intestine without perforation or abscess without bleeding: Secondary | ICD-10-CM | POA: Diagnosis not present

## 2019-12-02 DIAGNOSIS — R0902 Hypoxemia: Secondary | ICD-10-CM | POA: Diagnosis not present

## 2019-12-02 DIAGNOSIS — R627 Adult failure to thrive: Secondary | ICD-10-CM | POA: Diagnosis not present

## 2019-12-02 DIAGNOSIS — N390 Urinary tract infection, site not specified: Secondary | ICD-10-CM | POA: Diagnosis not present

## 2019-12-02 DIAGNOSIS — R1084 Generalized abdominal pain: Secondary | ICD-10-CM | POA: Diagnosis not present

## 2019-12-02 DIAGNOSIS — F0150 Vascular dementia without behavioral disturbance: Secondary | ICD-10-CM | POA: Diagnosis not present

## 2019-12-02 DIAGNOSIS — I4581 Long QT syndrome: Secondary | ICD-10-CM | POA: Diagnosis not present

## 2019-12-02 DIAGNOSIS — I10 Essential (primary) hypertension: Secondary | ICD-10-CM | POA: Diagnosis not present

## 2019-12-02 DIAGNOSIS — B9620 Unspecified Escherichia coli [E. coli] as the cause of diseases classified elsewhere: Secondary | ICD-10-CM | POA: Diagnosis not present

## 2019-12-02 DIAGNOSIS — N189 Chronic kidney disease, unspecified: Secondary | ICD-10-CM | POA: Diagnosis not present

## 2019-12-03 DIAGNOSIS — F05 Delirium due to known physiological condition: Secondary | ICD-10-CM | POA: Diagnosis not present

## 2019-12-03 DIAGNOSIS — G9341 Metabolic encephalopathy: Secondary | ICD-10-CM | POA: Diagnosis not present

## 2019-12-03 DIAGNOSIS — N390 Urinary tract infection, site not specified: Secondary | ICD-10-CM | POA: Diagnosis not present

## 2019-12-03 DIAGNOSIS — G459 Transient cerebral ischemic attack, unspecified: Secondary | ICD-10-CM | POA: Diagnosis not present

## 2019-12-03 DIAGNOSIS — K219 Gastro-esophageal reflux disease without esophagitis: Secondary | ICD-10-CM | POA: Diagnosis not present

## 2019-12-03 DIAGNOSIS — N184 Chronic kidney disease, stage 4 (severe): Secondary | ICD-10-CM | POA: Diagnosis not present

## 2019-12-03 DIAGNOSIS — I4581 Long QT syndrome: Secondary | ICD-10-CM | POA: Diagnosis not present

## 2019-12-03 DIAGNOSIS — I130 Hypertensive heart and chronic kidney disease with heart failure and stage 1 through stage 4 chronic kidney disease, or unspecified chronic kidney disease: Secondary | ICD-10-CM | POA: Diagnosis not present

## 2019-12-03 DIAGNOSIS — D649 Anemia, unspecified: Secondary | ICD-10-CM | POA: Diagnosis not present

## 2019-12-03 DIAGNOSIS — R569 Unspecified convulsions: Secondary | ICD-10-CM | POA: Diagnosis not present

## 2019-12-03 DIAGNOSIS — F0391 Unspecified dementia with behavioral disturbance: Secondary | ICD-10-CM | POA: Diagnosis not present

## 2019-12-03 DIAGNOSIS — R279 Unspecified lack of coordination: Secondary | ICD-10-CM | POA: Diagnosis not present

## 2019-12-03 DIAGNOSIS — D631 Anemia in chronic kidney disease: Secondary | ICD-10-CM | POA: Diagnosis not present

## 2019-12-03 DIAGNOSIS — N179 Acute kidney failure, unspecified: Secondary | ICD-10-CM | POA: Diagnosis not present

## 2019-12-03 DIAGNOSIS — D638 Anemia in other chronic diseases classified elsewhere: Secondary | ICD-10-CM | POA: Diagnosis not present

## 2019-12-03 DIAGNOSIS — R109 Unspecified abdominal pain: Secondary | ICD-10-CM | POA: Diagnosis not present

## 2019-12-03 DIAGNOSIS — I252 Old myocardial infarction: Secondary | ICD-10-CM | POA: Diagnosis not present

## 2019-12-03 DIAGNOSIS — R627 Adult failure to thrive: Secondary | ICD-10-CM | POA: Diagnosis not present

## 2019-12-03 DIAGNOSIS — Z8619 Personal history of other infectious and parasitic diseases: Secondary | ICD-10-CM | POA: Diagnosis not present

## 2019-12-03 DIAGNOSIS — I129 Hypertensive chronic kidney disease with stage 1 through stage 4 chronic kidney disease, or unspecified chronic kidney disease: Secondary | ICD-10-CM | POA: Diagnosis not present

## 2019-12-03 DIAGNOSIS — R296 Repeated falls: Secondary | ICD-10-CM | POA: Diagnosis not present

## 2019-12-03 DIAGNOSIS — S22080D Wedge compression fracture of T11-T12 vertebra, subsequent encounter for fracture with routine healing: Secondary | ICD-10-CM | POA: Diagnosis not present

## 2019-12-03 DIAGNOSIS — Z681 Body mass index (BMI) 19 or less, adult: Secondary | ICD-10-CM | POA: Diagnosis not present

## 2019-12-03 DIAGNOSIS — F418 Other specified anxiety disorders: Secondary | ICD-10-CM | POA: Diagnosis not present

## 2019-12-03 DIAGNOSIS — N189 Chronic kidney disease, unspecified: Secondary | ICD-10-CM | POA: Diagnosis not present

## 2019-12-03 DIAGNOSIS — I6930 Unspecified sequelae of cerebral infarction: Secondary | ICD-10-CM | POA: Diagnosis not present

## 2019-12-03 DIAGNOSIS — R9431 Abnormal electrocardiogram [ECG] [EKG]: Secondary | ICD-10-CM | POA: Diagnosis not present

## 2019-12-03 DIAGNOSIS — K5730 Diverticulosis of large intestine without perforation or abscess without bleeding: Secondary | ICD-10-CM | POA: Diagnosis not present

## 2019-12-03 DIAGNOSIS — Z743 Need for continuous supervision: Secondary | ICD-10-CM | POA: Diagnosis not present

## 2019-12-03 DIAGNOSIS — B9620 Unspecified Escherichia coli [E. coli] as the cause of diseases classified elsewhere: Secondary | ICD-10-CM | POA: Diagnosis not present

## 2019-12-03 DIAGNOSIS — C9000 Multiple myeloma not having achieved remission: Secondary | ICD-10-CM | POA: Diagnosis not present

## 2019-12-03 DIAGNOSIS — M4854XA Collapsed vertebra, not elsewhere classified, thoracic region, initial encounter for fracture: Secondary | ICD-10-CM | POA: Diagnosis not present

## 2019-12-03 DIAGNOSIS — E43 Unspecified severe protein-calorie malnutrition: Secondary | ICD-10-CM | POA: Diagnosis not present

## 2019-12-03 DIAGNOSIS — R4182 Altered mental status, unspecified: Secondary | ICD-10-CM | POA: Diagnosis not present

## 2019-12-03 DIAGNOSIS — E860 Dehydration: Secondary | ICD-10-CM | POA: Diagnosis not present

## 2019-12-04 DIAGNOSIS — E860 Dehydration: Secondary | ICD-10-CM | POA: Diagnosis not present

## 2019-12-04 DIAGNOSIS — N189 Chronic kidney disease, unspecified: Secondary | ICD-10-CM | POA: Diagnosis not present

## 2019-12-04 DIAGNOSIS — R9431 Abnormal electrocardiogram [ECG] [EKG]: Secondary | ICD-10-CM | POA: Diagnosis not present

## 2019-12-04 DIAGNOSIS — R109 Unspecified abdominal pain: Secondary | ICD-10-CM | POA: Diagnosis not present

## 2019-12-05 DIAGNOSIS — N189 Chronic kidney disease, unspecified: Secondary | ICD-10-CM | POA: Diagnosis not present

## 2019-12-05 DIAGNOSIS — E860 Dehydration: Secondary | ICD-10-CM | POA: Diagnosis not present

## 2019-12-05 DIAGNOSIS — R9431 Abnormal electrocardiogram [ECG] [EKG]: Secondary | ICD-10-CM | POA: Diagnosis not present

## 2019-12-05 DIAGNOSIS — R109 Unspecified abdominal pain: Secondary | ICD-10-CM | POA: Diagnosis not present

## 2019-12-06 DIAGNOSIS — R109 Unspecified abdominal pain: Secondary | ICD-10-CM | POA: Diagnosis not present

## 2019-12-06 DIAGNOSIS — R9431 Abnormal electrocardiogram [ECG] [EKG]: Secondary | ICD-10-CM | POA: Diagnosis not present

## 2019-12-06 DIAGNOSIS — N189 Chronic kidney disease, unspecified: Secondary | ICD-10-CM | POA: Diagnosis not present

## 2019-12-06 DIAGNOSIS — E860 Dehydration: Secondary | ICD-10-CM | POA: Diagnosis not present

## 2019-12-07 DIAGNOSIS — R109 Unspecified abdominal pain: Secondary | ICD-10-CM | POA: Diagnosis not present

## 2019-12-07 DIAGNOSIS — N189 Chronic kidney disease, unspecified: Secondary | ICD-10-CM | POA: Diagnosis not present

## 2019-12-07 DIAGNOSIS — E860 Dehydration: Secondary | ICD-10-CM | POA: Diagnosis not present

## 2019-12-07 DIAGNOSIS — R9431 Abnormal electrocardiogram [ECG] [EKG]: Secondary | ICD-10-CM | POA: Diagnosis not present

## 2019-12-08 DIAGNOSIS — R109 Unspecified abdominal pain: Secondary | ICD-10-CM | POA: Diagnosis not present

## 2019-12-08 DIAGNOSIS — N189 Chronic kidney disease, unspecified: Secondary | ICD-10-CM | POA: Diagnosis not present

## 2019-12-08 DIAGNOSIS — R9431 Abnormal electrocardiogram [ECG] [EKG]: Secondary | ICD-10-CM | POA: Diagnosis not present

## 2019-12-08 DIAGNOSIS — E860 Dehydration: Secondary | ICD-10-CM | POA: Diagnosis not present

## 2019-12-09 DIAGNOSIS — N189 Chronic kidney disease, unspecified: Secondary | ICD-10-CM | POA: Diagnosis not present

## 2019-12-09 DIAGNOSIS — E860 Dehydration: Secondary | ICD-10-CM | POA: Diagnosis not present

## 2019-12-09 DIAGNOSIS — R9431 Abnormal electrocardiogram [ECG] [EKG]: Secondary | ICD-10-CM | POA: Diagnosis not present

## 2019-12-09 DIAGNOSIS — R109 Unspecified abdominal pain: Secondary | ICD-10-CM | POA: Diagnosis not present

## 2019-12-10 DIAGNOSIS — N189 Chronic kidney disease, unspecified: Secondary | ICD-10-CM | POA: Diagnosis not present

## 2019-12-10 DIAGNOSIS — R109 Unspecified abdominal pain: Secondary | ICD-10-CM | POA: Diagnosis not present

## 2019-12-10 DIAGNOSIS — E860 Dehydration: Secondary | ICD-10-CM | POA: Diagnosis not present

## 2019-12-10 DIAGNOSIS — R9431 Abnormal electrocardiogram [ECG] [EKG]: Secondary | ICD-10-CM | POA: Diagnosis not present

## 2019-12-11 DIAGNOSIS — R109 Unspecified abdominal pain: Secondary | ICD-10-CM | POA: Diagnosis not present

## 2019-12-11 DIAGNOSIS — E860 Dehydration: Secondary | ICD-10-CM | POA: Diagnosis not present

## 2019-12-11 DIAGNOSIS — N189 Chronic kidney disease, unspecified: Secondary | ICD-10-CM | POA: Diagnosis not present

## 2019-12-11 DIAGNOSIS — R9431 Abnormal electrocardiogram [ECG] [EKG]: Secondary | ICD-10-CM | POA: Diagnosis not present

## 2019-12-12 DIAGNOSIS — N189 Chronic kidney disease, unspecified: Secondary | ICD-10-CM | POA: Diagnosis not present

## 2019-12-12 DIAGNOSIS — E860 Dehydration: Secondary | ICD-10-CM | POA: Diagnosis not present

## 2019-12-12 DIAGNOSIS — R109 Unspecified abdominal pain: Secondary | ICD-10-CM | POA: Diagnosis not present

## 2019-12-12 DIAGNOSIS — R9431 Abnormal electrocardiogram [ECG] [EKG]: Secondary | ICD-10-CM | POA: Diagnosis not present

## 2019-12-13 DIAGNOSIS — N184 Chronic kidney disease, stage 4 (severe): Secondary | ICD-10-CM | POA: Diagnosis not present

## 2019-12-13 DIAGNOSIS — F0390 Unspecified dementia without behavioral disturbance: Secondary | ICD-10-CM | POA: Diagnosis not present

## 2019-12-13 DIAGNOSIS — S91002A Unspecified open wound, left ankle, initial encounter: Secondary | ICD-10-CM | POA: Diagnosis not present

## 2019-12-13 DIAGNOSIS — D649 Anemia, unspecified: Secondary | ICD-10-CM | POA: Diagnosis not present

## 2019-12-13 DIAGNOSIS — R569 Unspecified convulsions: Secondary | ICD-10-CM | POA: Diagnosis not present

## 2019-12-13 DIAGNOSIS — R627 Adult failure to thrive: Secondary | ICD-10-CM | POA: Diagnosis not present

## 2019-12-13 DIAGNOSIS — E860 Dehydration: Secondary | ICD-10-CM | POA: Diagnosis not present

## 2019-12-13 DIAGNOSIS — G459 Transient cerebral ischemic attack, unspecified: Secondary | ICD-10-CM | POA: Diagnosis not present

## 2019-12-13 DIAGNOSIS — N390 Urinary tract infection, site not specified: Secondary | ICD-10-CM | POA: Diagnosis not present

## 2019-12-13 DIAGNOSIS — F418 Other specified anxiety disorders: Secondary | ICD-10-CM | POA: Diagnosis not present

## 2019-12-13 DIAGNOSIS — R262 Difficulty in walking, not elsewhere classified: Secondary | ICD-10-CM | POA: Diagnosis not present

## 2019-12-13 DIAGNOSIS — I6930 Unspecified sequelae of cerebral infarction: Secondary | ICD-10-CM | POA: Diagnosis not present

## 2019-12-13 DIAGNOSIS — R9431 Abnormal electrocardiogram [ECG] [EKG]: Secondary | ICD-10-CM | POA: Diagnosis not present

## 2019-12-13 DIAGNOSIS — S91001A Unspecified open wound, right ankle, initial encounter: Secondary | ICD-10-CM | POA: Diagnosis not present

## 2019-12-13 DIAGNOSIS — Z8619 Personal history of other infectious and parasitic diseases: Secondary | ICD-10-CM | POA: Diagnosis not present

## 2019-12-13 DIAGNOSIS — R109 Unspecified abdominal pain: Secondary | ICD-10-CM | POA: Diagnosis not present

## 2019-12-13 DIAGNOSIS — Z743 Need for continuous supervision: Secondary | ICD-10-CM | POA: Diagnosis not present

## 2019-12-13 DIAGNOSIS — N189 Chronic kidney disease, unspecified: Secondary | ICD-10-CM | POA: Diagnosis not present

## 2019-12-13 DIAGNOSIS — R279 Unspecified lack of coordination: Secondary | ICD-10-CM | POA: Diagnosis not present

## 2019-12-13 DIAGNOSIS — Z20828 Contact with and (suspected) exposure to other viral communicable diseases: Secondary | ICD-10-CM | POA: Diagnosis not present

## 2019-12-13 DIAGNOSIS — S22080D Wedge compression fracture of T11-T12 vertebra, subsequent encounter for fracture with routine healing: Secondary | ICD-10-CM | POA: Diagnosis not present

## 2019-12-16 DIAGNOSIS — R262 Difficulty in walking, not elsewhere classified: Secondary | ICD-10-CM | POA: Diagnosis not present

## 2019-12-16 DIAGNOSIS — N184 Chronic kidney disease, stage 4 (severe): Secondary | ICD-10-CM | POA: Diagnosis not present

## 2019-12-16 DIAGNOSIS — F0390 Unspecified dementia without behavioral disturbance: Secondary | ICD-10-CM | POA: Diagnosis not present

## 2019-12-16 DIAGNOSIS — D649 Anemia, unspecified: Secondary | ICD-10-CM | POA: Diagnosis not present

## 2019-12-24 DIAGNOSIS — S91002A Unspecified open wound, left ankle, initial encounter: Secondary | ICD-10-CM | POA: Diagnosis not present

## 2019-12-24 DIAGNOSIS — S91001A Unspecified open wound, right ankle, initial encounter: Secondary | ICD-10-CM | POA: Diagnosis not present

## 2019-12-30 DIAGNOSIS — L89512 Pressure ulcer of right ankle, stage 2: Secondary | ICD-10-CM | POA: Diagnosis not present

## 2019-12-30 DIAGNOSIS — G629 Polyneuropathy, unspecified: Secondary | ICD-10-CM | POA: Diagnosis not present

## 2019-12-30 DIAGNOSIS — E039 Hypothyroidism, unspecified: Secondary | ICD-10-CM | POA: Diagnosis not present

## 2019-12-30 DIAGNOSIS — R569 Unspecified convulsions: Secondary | ICD-10-CM | POA: Diagnosis not present

## 2019-12-30 DIAGNOSIS — D631 Anemia in chronic kidney disease: Secondary | ICD-10-CM | POA: Diagnosis not present

## 2019-12-30 DIAGNOSIS — I739 Peripheral vascular disease, unspecified: Secondary | ICD-10-CM | POA: Diagnosis not present

## 2019-12-30 DIAGNOSIS — M8008XD Age-related osteoporosis with current pathological fracture, vertebra(e), subsequent encounter for fracture with routine healing: Secondary | ICD-10-CM | POA: Diagnosis not present

## 2019-12-30 DIAGNOSIS — N184 Chronic kidney disease, stage 4 (severe): Secondary | ICD-10-CM | POA: Diagnosis not present

## 2019-12-30 DIAGNOSIS — I69311 Memory deficit following cerebral infarction: Secondary | ICD-10-CM | POA: Diagnosis not present

## 2019-12-30 DIAGNOSIS — R627 Adult failure to thrive: Secondary | ICD-10-CM | POA: Diagnosis not present

## 2019-12-30 DIAGNOSIS — R531 Weakness: Secondary | ICD-10-CM | POA: Diagnosis not present

## 2019-12-30 DIAGNOSIS — F0280 Dementia in other diseases classified elsewhere without behavioral disturbance: Secondary | ICD-10-CM | POA: Diagnosis not present

## 2019-12-30 DIAGNOSIS — G8929 Other chronic pain: Secondary | ICD-10-CM | POA: Diagnosis not present

## 2019-12-30 DIAGNOSIS — I2510 Atherosclerotic heart disease of native coronary artery without angina pectoris: Secondary | ICD-10-CM | POA: Diagnosis not present

## 2019-12-30 DIAGNOSIS — L89522 Pressure ulcer of left ankle, stage 2: Secondary | ICD-10-CM | POA: Diagnosis not present

## 2019-12-30 DIAGNOSIS — E785 Hyperlipidemia, unspecified: Secondary | ICD-10-CM | POA: Diagnosis not present

## 2019-12-30 DIAGNOSIS — E43 Unspecified severe protein-calorie malnutrition: Secondary | ICD-10-CM | POA: Diagnosis not present

## 2020-01-01 DIAGNOSIS — S0083XA Contusion of other part of head, initial encounter: Secondary | ICD-10-CM | POA: Diagnosis not present

## 2020-01-01 DIAGNOSIS — R2689 Other abnormalities of gait and mobility: Secondary | ICD-10-CM | POA: Diagnosis not present

## 2020-01-01 DIAGNOSIS — R627 Adult failure to thrive: Secondary | ICD-10-CM | POA: Diagnosis not present

## 2020-01-01 DIAGNOSIS — M6281 Muscle weakness (generalized): Secondary | ICD-10-CM | POA: Diagnosis not present

## 2020-01-11 DIAGNOSIS — Z8673 Personal history of transient ischemic attack (TIA), and cerebral infarction without residual deficits: Secondary | ICD-10-CM | POA: Diagnosis not present

## 2020-01-11 DIAGNOSIS — R0902 Hypoxemia: Secondary | ICD-10-CM | POA: Diagnosis not present

## 2020-01-11 DIAGNOSIS — S60221A Contusion of right hand, initial encounter: Secondary | ICD-10-CM | POA: Diagnosis not present

## 2020-01-11 DIAGNOSIS — S0101XA Laceration without foreign body of scalp, initial encounter: Secondary | ICD-10-CM | POA: Diagnosis not present

## 2020-01-11 DIAGNOSIS — S5001XA Contusion of right elbow, initial encounter: Secondary | ICD-10-CM | POA: Diagnosis not present

## 2020-01-11 DIAGNOSIS — Z9981 Dependence on supplemental oxygen: Secondary | ICD-10-CM | POA: Diagnosis not present

## 2020-01-11 DIAGNOSIS — F418 Other specified anxiety disorders: Secondary | ICD-10-CM | POA: Diagnosis not present

## 2020-01-11 DIAGNOSIS — Z882 Allergy status to sulfonamides status: Secondary | ICD-10-CM | POA: Diagnosis not present

## 2020-01-11 DIAGNOSIS — I5032 Chronic diastolic (congestive) heart failure: Secondary | ICD-10-CM | POA: Diagnosis not present

## 2020-01-11 DIAGNOSIS — J479 Bronchiectasis, uncomplicated: Secondary | ICD-10-CM | POA: Diagnosis not present

## 2020-01-11 DIAGNOSIS — S299XXA Unspecified injury of thorax, initial encounter: Secondary | ICD-10-CM | POA: Diagnosis not present

## 2020-01-11 DIAGNOSIS — R220 Localized swelling, mass and lump, head: Secondary | ICD-10-CM | POA: Diagnosis not present

## 2020-01-11 DIAGNOSIS — J189 Pneumonia, unspecified organism: Secondary | ICD-10-CM | POA: Diagnosis not present

## 2020-01-11 DIAGNOSIS — S59901A Unspecified injury of right elbow, initial encounter: Secondary | ICD-10-CM | POA: Diagnosis not present

## 2020-01-11 DIAGNOSIS — N184 Chronic kidney disease, stage 4 (severe): Secondary | ICD-10-CM | POA: Diagnosis not present

## 2020-01-11 DIAGNOSIS — J8410 Pulmonary fibrosis, unspecified: Secondary | ICD-10-CM | POA: Diagnosis not present

## 2020-01-11 DIAGNOSIS — D472 Monoclonal gammopathy: Secondary | ICD-10-CM | POA: Diagnosis not present

## 2020-01-11 DIAGNOSIS — D509 Iron deficiency anemia, unspecified: Secondary | ICD-10-CM | POA: Diagnosis not present

## 2020-01-11 DIAGNOSIS — S199XXA Unspecified injury of neck, initial encounter: Secondary | ICD-10-CM | POA: Diagnosis not present

## 2020-01-11 DIAGNOSIS — Z881 Allergy status to other antibiotic agents status: Secondary | ICD-10-CM | POA: Diagnosis not present

## 2020-01-11 DIAGNOSIS — E039 Hypothyroidism, unspecified: Secondary | ICD-10-CM | POA: Diagnosis not present

## 2020-01-11 DIAGNOSIS — F0390 Unspecified dementia without behavioral disturbance: Secondary | ICD-10-CM | POA: Diagnosis not present

## 2020-01-11 DIAGNOSIS — S6991XA Unspecified injury of right wrist, hand and finger(s), initial encounter: Secondary | ICD-10-CM | POA: Diagnosis not present

## 2020-01-11 DIAGNOSIS — R52 Pain, unspecified: Secondary | ICD-10-CM | POA: Diagnosis not present

## 2020-01-11 DIAGNOSIS — R58 Hemorrhage, not elsewhere classified: Secondary | ICD-10-CM | POA: Diagnosis not present

## 2020-01-11 DIAGNOSIS — I130 Hypertensive heart and chronic kidney disease with heart failure and stage 1 through stage 4 chronic kidney disease, or unspecified chronic kidney disease: Secondary | ICD-10-CM | POA: Diagnosis not present

## 2020-01-11 DIAGNOSIS — W19XXXA Unspecified fall, initial encounter: Secondary | ICD-10-CM | POA: Diagnosis not present

## 2020-01-11 DIAGNOSIS — J156 Pneumonia due to other aerobic Gram-negative bacteria: Secondary | ICD-10-CM | POA: Diagnosis not present

## 2020-01-11 DIAGNOSIS — S3993XA Unspecified injury of pelvis, initial encounter: Secondary | ICD-10-CM | POA: Diagnosis not present

## 2020-01-11 DIAGNOSIS — I2510 Atherosclerotic heart disease of native coronary artery without angina pectoris: Secondary | ICD-10-CM | POA: Diagnosis not present

## 2020-01-12 DIAGNOSIS — D509 Iron deficiency anemia, unspecified: Secondary | ICD-10-CM | POA: Diagnosis not present

## 2020-01-12 DIAGNOSIS — J156 Pneumonia due to other aerobic Gram-negative bacteria: Secondary | ICD-10-CM | POA: Diagnosis not present

## 2020-01-13 DIAGNOSIS — J156 Pneumonia due to other aerobic Gram-negative bacteria: Secondary | ICD-10-CM | POA: Diagnosis not present

## 2020-01-13 DIAGNOSIS — D509 Iron deficiency anemia, unspecified: Secondary | ICD-10-CM | POA: Diagnosis not present

## 2020-01-14 DIAGNOSIS — J156 Pneumonia due to other aerobic Gram-negative bacteria: Secondary | ICD-10-CM | POA: Diagnosis not present

## 2020-01-14 DIAGNOSIS — D509 Iron deficiency anemia, unspecified: Secondary | ICD-10-CM | POA: Diagnosis not present

## 2020-01-15 DIAGNOSIS — J479 Bronchiectasis, uncomplicated: Secondary | ICD-10-CM | POA: Diagnosis not present

## 2020-01-15 DIAGNOSIS — J189 Pneumonia, unspecified organism: Secondary | ICD-10-CM | POA: Diagnosis not present

## 2020-01-15 DIAGNOSIS — F0150 Vascular dementia without behavioral disturbance: Secondary | ICD-10-CM | POA: Diagnosis not present

## 2020-01-15 DIAGNOSIS — M6281 Muscle weakness (generalized): Secondary | ICD-10-CM | POA: Diagnosis not present

## 2020-01-16 DIAGNOSIS — R631 Polydipsia: Secondary | ICD-10-CM | POA: Diagnosis not present

## 2020-01-16 DIAGNOSIS — R5381 Other malaise: Secondary | ICD-10-CM | POA: Diagnosis not present

## 2020-01-16 DIAGNOSIS — R195 Other fecal abnormalities: Secondary | ICD-10-CM | POA: Diagnosis not present

## 2020-01-16 DIAGNOSIS — C9000 Multiple myeloma not having achieved remission: Secondary | ICD-10-CM | POA: Diagnosis not present

## 2020-01-16 DIAGNOSIS — D472 Monoclonal gammopathy: Secondary | ICD-10-CM | POA: Diagnosis not present

## 2020-01-16 DIAGNOSIS — R634 Abnormal weight loss: Secondary | ICD-10-CM | POA: Diagnosis not present

## 2020-01-22 DIAGNOSIS — F0150 Vascular dementia without behavioral disturbance: Secondary | ICD-10-CM | POA: Diagnosis not present

## 2020-01-22 DIAGNOSIS — R269 Unspecified abnormalities of gait and mobility: Secondary | ICD-10-CM | POA: Diagnosis not present

## 2020-01-22 DIAGNOSIS — M6281 Muscle weakness (generalized): Secondary | ICD-10-CM | POA: Diagnosis not present

## 2020-01-22 DIAGNOSIS — R110 Nausea: Secondary | ICD-10-CM | POA: Diagnosis not present

## 2020-01-27 DIAGNOSIS — D631 Anemia in chronic kidney disease: Secondary | ICD-10-CM | POA: Diagnosis not present

## 2020-01-27 DIAGNOSIS — F0150 Vascular dementia without behavioral disturbance: Secondary | ICD-10-CM | POA: Diagnosis not present

## 2020-01-27 DIAGNOSIS — K921 Melena: Secondary | ICD-10-CM | POA: Diagnosis not present

## 2020-01-27 DIAGNOSIS — N184 Chronic kidney disease, stage 4 (severe): Secondary | ICD-10-CM | POA: Diagnosis not present

## 2020-01-29 DIAGNOSIS — F0280 Dementia in other diseases classified elsewhere without behavioral disturbance: Secondary | ICD-10-CM | POA: Diagnosis not present

## 2020-01-29 DIAGNOSIS — M8008XD Age-related osteoporosis with current pathological fracture, vertebra(e), subsequent encounter for fracture with routine healing: Secondary | ICD-10-CM | POA: Diagnosis not present

## 2020-01-29 DIAGNOSIS — R627 Adult failure to thrive: Secondary | ICD-10-CM | POA: Diagnosis not present

## 2020-01-29 DIAGNOSIS — E43 Unspecified severe protein-calorie malnutrition: Secondary | ICD-10-CM | POA: Diagnosis not present

## 2020-01-29 DIAGNOSIS — N184 Chronic kidney disease, stage 4 (severe): Secondary | ICD-10-CM | POA: Diagnosis not present

## 2020-01-29 DIAGNOSIS — I5030 Unspecified diastolic (congestive) heart failure: Secondary | ICD-10-CM | POA: Diagnosis not present

## 2020-01-29 DIAGNOSIS — S0101XD Laceration without foreign body of scalp, subsequent encounter: Secondary | ICD-10-CM | POA: Diagnosis not present

## 2020-01-29 DIAGNOSIS — I69311 Memory deficit following cerebral infarction: Secondary | ICD-10-CM | POA: Diagnosis not present

## 2020-01-29 DIAGNOSIS — I130 Hypertensive heart and chronic kidney disease with heart failure and stage 1 through stage 4 chronic kidney disease, or unspecified chronic kidney disease: Secondary | ICD-10-CM | POA: Diagnosis not present

## 2020-01-29 DIAGNOSIS — L89512 Pressure ulcer of right ankle, stage 2: Secondary | ICD-10-CM | POA: Diagnosis not present

## 2020-01-29 DIAGNOSIS — G8929 Other chronic pain: Secondary | ICD-10-CM | POA: Diagnosis not present

## 2020-01-29 DIAGNOSIS — D631 Anemia in chronic kidney disease: Secondary | ICD-10-CM | POA: Diagnosis not present

## 2020-01-29 DIAGNOSIS — L89522 Pressure ulcer of left ankle, stage 2: Secondary | ICD-10-CM | POA: Diagnosis not present

## 2020-01-29 DIAGNOSIS — R569 Unspecified convulsions: Secondary | ICD-10-CM | POA: Diagnosis not present

## 2020-01-29 DIAGNOSIS — G629 Polyneuropathy, unspecified: Secondary | ICD-10-CM | POA: Diagnosis not present

## 2020-01-29 DIAGNOSIS — E039 Hypothyroidism, unspecified: Secondary | ICD-10-CM | POA: Diagnosis not present

## 2020-02-07 DIAGNOSIS — D638 Anemia in other chronic diseases classified elsewhere: Secondary | ICD-10-CM | POA: Diagnosis not present

## 2020-02-07 DIAGNOSIS — E039 Hypothyroidism, unspecified: Secondary | ICD-10-CM | POA: Diagnosis not present

## 2020-02-07 DIAGNOSIS — I361 Nonrheumatic tricuspid (valve) insufficiency: Secondary | ICD-10-CM | POA: Diagnosis not present

## 2020-02-07 DIAGNOSIS — N184 Chronic kidney disease, stage 4 (severe): Secondary | ICD-10-CM | POA: Diagnosis not present

## 2020-02-07 DIAGNOSIS — R296 Repeated falls: Secondary | ICD-10-CM | POA: Diagnosis not present

## 2020-02-07 DIAGNOSIS — I5032 Chronic diastolic (congestive) heart failure: Secondary | ICD-10-CM | POA: Diagnosis not present

## 2020-02-07 DIAGNOSIS — N189 Chronic kidney disease, unspecified: Secondary | ICD-10-CM | POA: Diagnosis not present

## 2020-02-07 DIAGNOSIS — R2689 Other abnormalities of gait and mobility: Secondary | ICD-10-CM | POA: Diagnosis not present

## 2020-02-07 DIAGNOSIS — I130 Hypertensive heart and chronic kidney disease with heart failure and stage 1 through stage 4 chronic kidney disease, or unspecified chronic kidney disease: Secondary | ICD-10-CM | POA: Diagnosis not present

## 2020-02-07 DIAGNOSIS — S0101XA Laceration without foreign body of scalp, initial encounter: Secondary | ICD-10-CM | POA: Diagnosis not present

## 2020-02-07 DIAGNOSIS — S72001D Fracture of unspecified part of neck of right femur, subsequent encounter for closed fracture with routine healing: Secondary | ICD-10-CM | POA: Diagnosis not present

## 2020-02-07 DIAGNOSIS — I252 Old myocardial infarction: Secondary | ICD-10-CM | POA: Diagnosis not present

## 2020-02-07 DIAGNOSIS — M6281 Muscle weakness (generalized): Secondary | ICD-10-CM | POA: Diagnosis not present

## 2020-02-07 DIAGNOSIS — Z681 Body mass index (BMI) 19 or less, adult: Secondary | ICD-10-CM | POA: Diagnosis not present

## 2020-02-07 DIAGNOSIS — R41841 Cognitive communication deficit: Secondary | ICD-10-CM | POA: Diagnosis not present

## 2020-02-07 DIAGNOSIS — D472 Monoclonal gammopathy: Secondary | ICD-10-CM | POA: Diagnosis not present

## 2020-02-07 DIAGNOSIS — I129 Hypertensive chronic kidney disease with stage 1 through stage 4 chronic kidney disease, or unspecified chronic kidney disease: Secondary | ICD-10-CM | POA: Diagnosis not present

## 2020-02-07 DIAGNOSIS — D509 Iron deficiency anemia, unspecified: Secondary | ICD-10-CM | POA: Diagnosis not present

## 2020-02-07 DIAGNOSIS — D62 Acute posthemorrhagic anemia: Secondary | ICD-10-CM | POA: Diagnosis not present

## 2020-02-07 DIAGNOSIS — R410 Disorientation, unspecified: Secondary | ICD-10-CM | POA: Diagnosis not present

## 2020-02-07 DIAGNOSIS — G9341 Metabolic encephalopathy: Secondary | ICD-10-CM | POA: Diagnosis not present

## 2020-02-07 DIAGNOSIS — E7800 Pure hypercholesterolemia, unspecified: Secondary | ICD-10-CM | POA: Diagnosis not present

## 2020-02-07 DIAGNOSIS — S72001A Fracture of unspecified part of neck of right femur, initial encounter for closed fracture: Secondary | ICD-10-CM | POA: Diagnosis not present

## 2020-02-07 DIAGNOSIS — I10 Essential (primary) hypertension: Secondary | ICD-10-CM | POA: Diagnosis not present

## 2020-02-07 DIAGNOSIS — F0150 Vascular dementia without behavioral disturbance: Secondary | ICD-10-CM | POA: Diagnosis not present

## 2020-02-07 DIAGNOSIS — R2681 Unsteadiness on feet: Secondary | ICD-10-CM | POA: Diagnosis not present

## 2020-02-07 DIAGNOSIS — M79604 Pain in right leg: Secondary | ICD-10-CM | POA: Diagnosis not present

## 2020-02-07 DIAGNOSIS — R278 Other lack of coordination: Secondary | ICD-10-CM | POA: Diagnosis not present

## 2020-02-07 DIAGNOSIS — M2550 Pain in unspecified joint: Secondary | ICD-10-CM | POA: Diagnosis not present

## 2020-02-07 DIAGNOSIS — E871 Hypo-osmolality and hyponatremia: Secondary | ICD-10-CM | POA: Diagnosis not present

## 2020-02-07 DIAGNOSIS — S72141A Displaced intertrochanteric fracture of right femur, initial encounter for closed fracture: Secondary | ICD-10-CM | POA: Diagnosis not present

## 2020-02-07 DIAGNOSIS — Z8673 Personal history of transient ischemic attack (TIA), and cerebral infarction without residual deficits: Secondary | ICD-10-CM | POA: Diagnosis not present

## 2020-02-07 DIAGNOSIS — R29818 Other symptoms and signs involving the nervous system: Secondary | ICD-10-CM | POA: Diagnosis not present

## 2020-02-07 DIAGNOSIS — Z7401 Bed confinement status: Secondary | ICD-10-CM | POA: Diagnosis not present

## 2020-02-07 DIAGNOSIS — R0902 Hypoxemia: Secondary | ICD-10-CM | POA: Diagnosis not present

## 2020-02-07 DIAGNOSIS — I340 Nonrheumatic mitral (valve) insufficiency: Secondary | ICD-10-CM | POA: Diagnosis not present

## 2020-02-07 DIAGNOSIS — R52 Pain, unspecified: Secondary | ICD-10-CM | POA: Diagnosis not present

## 2020-02-08 DIAGNOSIS — D509 Iron deficiency anemia, unspecified: Secondary | ICD-10-CM | POA: Diagnosis not present

## 2020-02-08 DIAGNOSIS — I10 Essential (primary) hypertension: Secondary | ICD-10-CM | POA: Diagnosis not present

## 2020-02-08 DIAGNOSIS — S72001A Fracture of unspecified part of neck of right femur, initial encounter for closed fracture: Secondary | ICD-10-CM | POA: Diagnosis not present

## 2020-02-08 DIAGNOSIS — N184 Chronic kidney disease, stage 4 (severe): Secondary | ICD-10-CM | POA: Diagnosis not present

## 2020-02-09 DIAGNOSIS — N184 Chronic kidney disease, stage 4 (severe): Secondary | ICD-10-CM | POA: Diagnosis not present

## 2020-02-09 DIAGNOSIS — I10 Essential (primary) hypertension: Secondary | ICD-10-CM | POA: Diagnosis not present

## 2020-02-09 DIAGNOSIS — S72001A Fracture of unspecified part of neck of right femur, initial encounter for closed fracture: Secondary | ICD-10-CM | POA: Diagnosis not present

## 2020-02-09 DIAGNOSIS — D509 Iron deficiency anemia, unspecified: Secondary | ICD-10-CM | POA: Diagnosis not present

## 2020-02-10 DIAGNOSIS — N184 Chronic kidney disease, stage 4 (severe): Secondary | ICD-10-CM | POA: Diagnosis not present

## 2020-02-10 DIAGNOSIS — S72001A Fracture of unspecified part of neck of right femur, initial encounter for closed fracture: Secondary | ICD-10-CM | POA: Diagnosis not present

## 2020-02-10 DIAGNOSIS — D509 Iron deficiency anemia, unspecified: Secondary | ICD-10-CM | POA: Diagnosis not present

## 2020-02-10 DIAGNOSIS — I10 Essential (primary) hypertension: Secondary | ICD-10-CM | POA: Diagnosis not present

## 2020-02-11 DIAGNOSIS — I10 Essential (primary) hypertension: Secondary | ICD-10-CM | POA: Diagnosis not present

## 2020-02-11 DIAGNOSIS — I361 Nonrheumatic tricuspid (valve) insufficiency: Secondary | ICD-10-CM

## 2020-02-11 DIAGNOSIS — I5032 Chronic diastolic (congestive) heart failure: Secondary | ICD-10-CM

## 2020-02-11 DIAGNOSIS — S72001A Fracture of unspecified part of neck of right femur, initial encounter for closed fracture: Secondary | ICD-10-CM | POA: Diagnosis not present

## 2020-02-11 DIAGNOSIS — D509 Iron deficiency anemia, unspecified: Secondary | ICD-10-CM | POA: Diagnosis not present

## 2020-02-11 DIAGNOSIS — N184 Chronic kidney disease, stage 4 (severe): Secondary | ICD-10-CM | POA: Diagnosis not present

## 2020-02-11 DIAGNOSIS — I340 Nonrheumatic mitral (valve) insufficiency: Secondary | ICD-10-CM

## 2020-02-12 DIAGNOSIS — D472 Monoclonal gammopathy: Secondary | ICD-10-CM | POA: Diagnosis not present

## 2020-02-12 DIAGNOSIS — R52 Pain, unspecified: Secondary | ICD-10-CM | POA: Diagnosis not present

## 2020-02-12 DIAGNOSIS — I10 Essential (primary) hypertension: Secondary | ICD-10-CM | POA: Diagnosis not present

## 2020-02-12 DIAGNOSIS — M79601 Pain in right arm: Secondary | ICD-10-CM | POA: Diagnosis not present

## 2020-02-12 DIAGNOSIS — R410 Disorientation, unspecified: Secondary | ICD-10-CM | POA: Diagnosis not present

## 2020-02-12 DIAGNOSIS — N184 Chronic kidney disease, stage 4 (severe): Secondary | ICD-10-CM | POA: Diagnosis not present

## 2020-02-12 DIAGNOSIS — E039 Hypothyroidism, unspecified: Secondary | ICD-10-CM | POA: Diagnosis not present

## 2020-02-12 DIAGNOSIS — G8918 Other acute postprocedural pain: Secondary | ICD-10-CM | POA: Diagnosis not present

## 2020-02-12 DIAGNOSIS — D62 Acute posthemorrhagic anemia: Secondary | ICD-10-CM | POA: Diagnosis not present

## 2020-02-12 DIAGNOSIS — R278 Other lack of coordination: Secondary | ICD-10-CM | POA: Diagnosis not present

## 2020-02-12 DIAGNOSIS — M79631 Pain in right forearm: Secondary | ICD-10-CM | POA: Diagnosis not present

## 2020-02-12 DIAGNOSIS — R262 Difficulty in walking, not elsewhere classified: Secondary | ICD-10-CM | POA: Diagnosis not present

## 2020-02-12 DIAGNOSIS — R2689 Other abnormalities of gait and mobility: Secondary | ICD-10-CM | POA: Diagnosis not present

## 2020-02-12 DIAGNOSIS — D649 Anemia, unspecified: Secondary | ICD-10-CM | POA: Diagnosis not present

## 2020-02-12 DIAGNOSIS — S72001A Fracture of unspecified part of neck of right femur, initial encounter for closed fracture: Secondary | ICD-10-CM | POA: Diagnosis not present

## 2020-02-12 DIAGNOSIS — M6281 Muscle weakness (generalized): Secondary | ICD-10-CM | POA: Diagnosis not present

## 2020-02-12 DIAGNOSIS — R2681 Unsteadiness on feet: Secondary | ICD-10-CM | POA: Diagnosis not present

## 2020-02-12 DIAGNOSIS — M2550 Pain in unspecified joint: Secondary | ICD-10-CM | POA: Diagnosis not present

## 2020-02-12 DIAGNOSIS — Z7401 Bed confinement status: Secondary | ICD-10-CM | POA: Diagnosis not present

## 2020-02-12 DIAGNOSIS — Z79899 Other long term (current) drug therapy: Secondary | ICD-10-CM | POA: Diagnosis not present

## 2020-02-12 DIAGNOSIS — R41841 Cognitive communication deficit: Secondary | ICD-10-CM | POA: Diagnosis not present

## 2020-02-12 DIAGNOSIS — S72001D Fracture of unspecified part of neck of right femur, subsequent encounter for closed fracture with routine healing: Secondary | ICD-10-CM | POA: Diagnosis not present

## 2020-02-12 DIAGNOSIS — R296 Repeated falls: Secondary | ICD-10-CM | POA: Diagnosis not present

## 2020-02-12 DIAGNOSIS — D509 Iron deficiency anemia, unspecified: Secondary | ICD-10-CM | POA: Diagnosis not present

## 2020-02-14 DIAGNOSIS — D649 Anemia, unspecified: Secondary | ICD-10-CM | POA: Diagnosis not present

## 2020-02-14 DIAGNOSIS — G8918 Other acute postprocedural pain: Secondary | ICD-10-CM | POA: Diagnosis not present

## 2020-02-14 DIAGNOSIS — R262 Difficulty in walking, not elsewhere classified: Secondary | ICD-10-CM | POA: Diagnosis not present

## 2020-02-14 DIAGNOSIS — S72001D Fracture of unspecified part of neck of right femur, subsequent encounter for closed fracture with routine healing: Secondary | ICD-10-CM | POA: Diagnosis not present

## 2020-03-03 DIAGNOSIS — M6281 Muscle weakness (generalized): Secondary | ICD-10-CM | POA: Diagnosis not present

## 2020-03-03 DIAGNOSIS — R296 Repeated falls: Secondary | ICD-10-CM | POA: Diagnosis not present

## 2020-03-03 DIAGNOSIS — R2689 Other abnormalities of gait and mobility: Secondary | ICD-10-CM | POA: Diagnosis not present

## 2020-03-03 DIAGNOSIS — R2681 Unsteadiness on feet: Secondary | ICD-10-CM | POA: Diagnosis not present

## 2020-03-03 DIAGNOSIS — R278 Other lack of coordination: Secondary | ICD-10-CM | POA: Diagnosis not present

## 2020-03-03 DIAGNOSIS — D62 Acute posthemorrhagic anemia: Secondary | ICD-10-CM | POA: Diagnosis not present

## 2020-03-03 DIAGNOSIS — G9341 Metabolic encephalopathy: Secondary | ICD-10-CM | POA: Diagnosis not present

## 2020-03-03 DIAGNOSIS — R41841 Cognitive communication deficit: Secondary | ICD-10-CM | POA: Diagnosis not present

## 2020-03-03 DIAGNOSIS — S72001D Fracture of unspecified part of neck of right femur, subsequent encounter for closed fracture with routine healing: Secondary | ICD-10-CM | POA: Diagnosis not present

## 2020-03-04 DIAGNOSIS — R2689 Other abnormalities of gait and mobility: Secondary | ICD-10-CM | POA: Diagnosis not present

## 2020-03-04 DIAGNOSIS — R296 Repeated falls: Secondary | ICD-10-CM | POA: Diagnosis not present

## 2020-03-04 DIAGNOSIS — D62 Acute posthemorrhagic anemia: Secondary | ICD-10-CM | POA: Diagnosis not present

## 2020-03-04 DIAGNOSIS — R2681 Unsteadiness on feet: Secondary | ICD-10-CM | POA: Diagnosis not present

## 2020-03-04 DIAGNOSIS — R278 Other lack of coordination: Secondary | ICD-10-CM | POA: Diagnosis not present

## 2020-03-04 DIAGNOSIS — G9341 Metabolic encephalopathy: Secondary | ICD-10-CM | POA: Diagnosis not present

## 2020-03-04 DIAGNOSIS — S72001D Fracture of unspecified part of neck of right femur, subsequent encounter for closed fracture with routine healing: Secondary | ICD-10-CM | POA: Diagnosis not present

## 2020-03-04 DIAGNOSIS — M6281 Muscle weakness (generalized): Secondary | ICD-10-CM | POA: Diagnosis not present

## 2020-03-04 DIAGNOSIS — R41841 Cognitive communication deficit: Secondary | ICD-10-CM | POA: Diagnosis not present

## 2020-03-16 DIAGNOSIS — R1111 Vomiting without nausea: Secondary | ICD-10-CM | POA: Diagnosis not present

## 2020-03-19 DIAGNOSIS — J189 Pneumonia, unspecified organism: Secondary | ICD-10-CM | POA: Diagnosis not present

## 2020-03-19 DIAGNOSIS — I2510 Atherosclerotic heart disease of native coronary artery without angina pectoris: Secondary | ICD-10-CM | POA: Diagnosis not present

## 2020-03-19 DIAGNOSIS — I129 Hypertensive chronic kidney disease with stage 1 through stage 4 chronic kidney disease, or unspecified chronic kidney disease: Secondary | ICD-10-CM | POA: Diagnosis not present

## 2020-03-19 DIAGNOSIS — E039 Hypothyroidism, unspecified: Secondary | ICD-10-CM | POA: Diagnosis not present

## 2020-03-21 DIAGNOSIS — N184 Chronic kidney disease, stage 4 (severe): Secondary | ICD-10-CM | POA: Diagnosis not present

## 2020-03-21 DIAGNOSIS — W19XXXA Unspecified fall, initial encounter: Secondary | ICD-10-CM | POA: Diagnosis not present

## 2020-03-21 DIAGNOSIS — S199XXA Unspecified injury of neck, initial encounter: Secondary | ICD-10-CM | POA: Diagnosis not present

## 2020-03-21 DIAGNOSIS — I130 Hypertensive heart and chronic kidney disease with heart failure and stage 1 through stage 4 chronic kidney disease, or unspecified chronic kidney disease: Secondary | ICD-10-CM | POA: Diagnosis not present

## 2020-03-21 DIAGNOSIS — I509 Heart failure, unspecified: Secondary | ICD-10-CM | POA: Diagnosis not present

## 2020-03-21 DIAGNOSIS — I252 Old myocardial infarction: Secondary | ICD-10-CM | POA: Diagnosis not present

## 2020-03-21 DIAGNOSIS — Z8673 Personal history of transient ischemic attack (TIA), and cerebral infarction without residual deficits: Secondary | ICD-10-CM | POA: Diagnosis not present

## 2020-03-21 DIAGNOSIS — S299XXA Unspecified injury of thorax, initial encounter: Secondary | ICD-10-CM | POA: Diagnosis not present

## 2020-03-21 DIAGNOSIS — F0390 Unspecified dementia without behavioral disturbance: Secondary | ICD-10-CM | POA: Diagnosis not present

## 2020-03-21 DIAGNOSIS — S0003XA Contusion of scalp, initial encounter: Secondary | ICD-10-CM | POA: Diagnosis not present

## 2020-03-21 DIAGNOSIS — S79912A Unspecified injury of left hip, initial encounter: Secondary | ICD-10-CM | POA: Diagnosis not present

## 2020-03-21 DIAGNOSIS — R0902 Hypoxemia: Secondary | ICD-10-CM | POA: Diagnosis not present

## 2020-03-21 DIAGNOSIS — S79911A Unspecified injury of right hip, initial encounter: Secondary | ICD-10-CM | POA: Diagnosis not present

## 2020-03-21 DIAGNOSIS — S0990XA Unspecified injury of head, initial encounter: Secondary | ICD-10-CM | POA: Diagnosis not present

## 2020-03-23 DIAGNOSIS — E119 Type 2 diabetes mellitus without complications: Secondary | ICD-10-CM | POA: Diagnosis not present

## 2020-03-23 DIAGNOSIS — D518 Other vitamin B12 deficiency anemias: Secondary | ICD-10-CM | POA: Diagnosis not present

## 2020-03-23 DIAGNOSIS — Z79899 Other long term (current) drug therapy: Secondary | ICD-10-CM | POA: Diagnosis not present

## 2020-03-23 DIAGNOSIS — E7849 Other hyperlipidemia: Secondary | ICD-10-CM | POA: Diagnosis not present

## 2020-03-28 DIAGNOSIS — S51001A Unspecified open wound of right elbow, initial encounter: Secondary | ICD-10-CM | POA: Diagnosis not present

## 2020-03-28 DIAGNOSIS — R52 Pain, unspecified: Secondary | ICD-10-CM | POA: Diagnosis not present

## 2020-03-28 DIAGNOSIS — W19XXXA Unspecified fall, initial encounter: Secondary | ICD-10-CM | POA: Diagnosis not present

## 2020-03-28 DIAGNOSIS — S5001XA Contusion of right elbow, initial encounter: Secondary | ICD-10-CM | POA: Diagnosis not present

## 2020-03-28 DIAGNOSIS — R0902 Hypoxemia: Secondary | ICD-10-CM | POA: Diagnosis not present

## 2020-03-28 DIAGNOSIS — M7989 Other specified soft tissue disorders: Secondary | ICD-10-CM | POA: Diagnosis not present

## 2020-03-28 DIAGNOSIS — R296 Repeated falls: Secondary | ICD-10-CM | POA: Diagnosis not present

## 2020-03-28 DIAGNOSIS — Z8673 Personal history of transient ischemic attack (TIA), and cerebral infarction without residual deficits: Secondary | ICD-10-CM | POA: Diagnosis not present

## 2020-03-28 DIAGNOSIS — F0390 Unspecified dementia without behavioral disturbance: Secondary | ICD-10-CM | POA: Diagnosis not present

## 2020-04-08 DIAGNOSIS — D631 Anemia in chronic kidney disease: Secondary | ICD-10-CM | POA: Diagnosis not present

## 2020-04-08 DIAGNOSIS — R569 Unspecified convulsions: Secondary | ICD-10-CM | POA: Diagnosis not present

## 2020-04-08 DIAGNOSIS — F0280 Dementia in other diseases classified elsewhere without behavioral disturbance: Secondary | ICD-10-CM | POA: Diagnosis not present

## 2020-04-08 DIAGNOSIS — I2510 Atherosclerotic heart disease of native coronary artery without angina pectoris: Secondary | ICD-10-CM | POA: Diagnosis not present

## 2020-04-08 DIAGNOSIS — I5030 Unspecified diastolic (congestive) heart failure: Secondary | ICD-10-CM | POA: Diagnosis not present

## 2020-04-08 DIAGNOSIS — G629 Polyneuropathy, unspecified: Secondary | ICD-10-CM | POA: Diagnosis not present

## 2020-04-08 DIAGNOSIS — F419 Anxiety disorder, unspecified: Secondary | ICD-10-CM | POA: Diagnosis not present

## 2020-04-08 DIAGNOSIS — E039 Hypothyroidism, unspecified: Secondary | ICD-10-CM | POA: Diagnosis not present

## 2020-04-08 DIAGNOSIS — F330 Major depressive disorder, recurrent, mild: Secondary | ICD-10-CM | POA: Diagnosis not present

## 2020-04-08 DIAGNOSIS — R627 Adult failure to thrive: Secondary | ICD-10-CM | POA: Diagnosis not present

## 2020-04-08 DIAGNOSIS — I130 Hypertensive heart and chronic kidney disease with heart failure and stage 1 through stage 4 chronic kidney disease, or unspecified chronic kidney disease: Secondary | ICD-10-CM | POA: Diagnosis not present

## 2020-04-08 DIAGNOSIS — D500 Iron deficiency anemia secondary to blood loss (chronic): Secondary | ICD-10-CM | POA: Diagnosis not present

## 2020-04-08 DIAGNOSIS — I69351 Hemiplegia and hemiparesis following cerebral infarction affecting right dominant side: Secondary | ICD-10-CM | POA: Diagnosis not present

## 2020-04-08 DIAGNOSIS — M80051D Age-related osteoporosis with current pathological fracture, right femur, subsequent encounter for fracture with routine healing: Secondary | ICD-10-CM | POA: Diagnosis not present

## 2020-04-08 DIAGNOSIS — N184 Chronic kidney disease, stage 4 (severe): Secondary | ICD-10-CM | POA: Diagnosis not present

## 2020-04-08 DIAGNOSIS — R296 Repeated falls: Secondary | ICD-10-CM | POA: Diagnosis not present

## 2020-04-09 DIAGNOSIS — F331 Major depressive disorder, recurrent, moderate: Secondary | ICD-10-CM | POA: Diagnosis not present

## 2020-04-09 DIAGNOSIS — G894 Chronic pain syndrome: Secondary | ICD-10-CM | POA: Diagnosis not present

## 2020-04-09 DIAGNOSIS — F0150 Vascular dementia without behavioral disturbance: Secondary | ICD-10-CM | POA: Diagnosis not present

## 2020-04-09 DIAGNOSIS — N184 Chronic kidney disease, stage 4 (severe): Secondary | ICD-10-CM | POA: Diagnosis not present

## 2020-04-21 DIAGNOSIS — Z8781 Personal history of (healed) traumatic fracture: Secondary | ICD-10-CM | POA: Diagnosis not present

## 2020-04-21 DIAGNOSIS — S72143A Displaced intertrochanteric fracture of unspecified femur, initial encounter for closed fracture: Secondary | ICD-10-CM | POA: Diagnosis not present

## 2020-04-23 DIAGNOSIS — S61411A Laceration without foreign body of right hand, initial encounter: Secondary | ICD-10-CM | POA: Diagnosis not present

## 2020-04-23 DIAGNOSIS — R296 Repeated falls: Secondary | ICD-10-CM | POA: Diagnosis not present

## 2020-04-23 DIAGNOSIS — F0390 Unspecified dementia without behavioral disturbance: Secondary | ICD-10-CM | POA: Diagnosis not present

## 2020-04-23 DIAGNOSIS — I69328 Other speech and language deficits following cerebral infarction: Secondary | ICD-10-CM | POA: Diagnosis not present

## 2020-05-06 DIAGNOSIS — R58 Hemorrhage, not elsewhere classified: Secondary | ICD-10-CM | POA: Diagnosis not present

## 2020-05-06 DIAGNOSIS — S0003XA Contusion of scalp, initial encounter: Secondary | ICD-10-CM | POA: Diagnosis not present

## 2020-05-06 DIAGNOSIS — R54 Age-related physical debility: Secondary | ICD-10-CM | POA: Diagnosis not present

## 2020-05-06 DIAGNOSIS — R404 Transient alteration of awareness: Secondary | ICD-10-CM | POA: Diagnosis not present

## 2020-05-06 DIAGNOSIS — G319 Degenerative disease of nervous system, unspecified: Secondary | ICD-10-CM | POA: Diagnosis not present

## 2020-05-06 DIAGNOSIS — W19XXXA Unspecified fall, initial encounter: Secondary | ICD-10-CM | POA: Diagnosis not present

## 2020-05-06 DIAGNOSIS — R410 Disorientation, unspecified: Secondary | ICD-10-CM | POA: Diagnosis not present

## 2020-05-06 DIAGNOSIS — R296 Repeated falls: Secondary | ICD-10-CM | POA: Diagnosis not present

## 2020-05-06 DIAGNOSIS — Z043 Encounter for examination and observation following other accident: Secondary | ICD-10-CM | POA: Diagnosis not present

## 2020-05-06 DIAGNOSIS — G9389 Other specified disorders of brain: Secondary | ICD-10-CM | POA: Diagnosis not present

## 2020-05-06 DIAGNOSIS — R220 Localized swelling, mass and lump, head: Secondary | ICD-10-CM | POA: Diagnosis not present

## 2020-05-06 DIAGNOSIS — R52 Pain, unspecified: Secondary | ICD-10-CM | POA: Diagnosis not present

## 2020-05-08 DIAGNOSIS — N184 Chronic kidney disease, stage 4 (severe): Secondary | ICD-10-CM | POA: Diagnosis not present

## 2020-05-08 DIAGNOSIS — R296 Repeated falls: Secondary | ICD-10-CM | POA: Diagnosis not present

## 2020-05-08 DIAGNOSIS — F0280 Dementia in other diseases classified elsewhere without behavioral disturbance: Secondary | ICD-10-CM | POA: Diagnosis not present

## 2020-05-08 DIAGNOSIS — R627 Adult failure to thrive: Secondary | ICD-10-CM | POA: Diagnosis not present

## 2020-05-08 DIAGNOSIS — E039 Hypothyroidism, unspecified: Secondary | ICD-10-CM | POA: Diagnosis not present

## 2020-05-08 DIAGNOSIS — D500 Iron deficiency anemia secondary to blood loss (chronic): Secondary | ICD-10-CM | POA: Diagnosis not present

## 2020-05-08 DIAGNOSIS — I2510 Atherosclerotic heart disease of native coronary artery without angina pectoris: Secondary | ICD-10-CM | POA: Diagnosis not present

## 2020-05-08 DIAGNOSIS — I69351 Hemiplegia and hemiparesis following cerebral infarction affecting right dominant side: Secondary | ICD-10-CM | POA: Diagnosis not present

## 2020-05-08 DIAGNOSIS — I130 Hypertensive heart and chronic kidney disease with heart failure and stage 1 through stage 4 chronic kidney disease, or unspecified chronic kidney disease: Secondary | ICD-10-CM | POA: Diagnosis not present

## 2020-05-08 DIAGNOSIS — I5030 Unspecified diastolic (congestive) heart failure: Secondary | ICD-10-CM | POA: Diagnosis not present

## 2020-05-08 DIAGNOSIS — G629 Polyneuropathy, unspecified: Secondary | ICD-10-CM | POA: Diagnosis not present

## 2020-05-08 DIAGNOSIS — M80051D Age-related osteoporosis with current pathological fracture, right femur, subsequent encounter for fracture with routine healing: Secondary | ICD-10-CM | POA: Diagnosis not present

## 2020-05-08 DIAGNOSIS — D631 Anemia in chronic kidney disease: Secondary | ICD-10-CM | POA: Diagnosis not present

## 2020-05-08 DIAGNOSIS — F419 Anxiety disorder, unspecified: Secondary | ICD-10-CM | POA: Diagnosis not present

## 2020-05-08 DIAGNOSIS — F330 Major depressive disorder, recurrent, mild: Secondary | ICD-10-CM | POA: Diagnosis not present

## 2020-05-08 DIAGNOSIS — R569 Unspecified convulsions: Secondary | ICD-10-CM | POA: Diagnosis not present

## 2020-05-09 DIAGNOSIS — J439 Emphysema, unspecified: Secondary | ICD-10-CM | POA: Diagnosis not present

## 2020-05-09 DIAGNOSIS — R58 Hemorrhage, not elsewhere classified: Secondary | ICD-10-CM | POA: Diagnosis not present

## 2020-05-09 DIAGNOSIS — W19XXXA Unspecified fall, initial encounter: Secondary | ICD-10-CM | POA: Diagnosis not present

## 2020-05-09 DIAGNOSIS — S0181XA Laceration without foreign body of other part of head, initial encounter: Secondary | ICD-10-CM | POA: Diagnosis not present

## 2020-05-09 DIAGNOSIS — R0902 Hypoxemia: Secondary | ICD-10-CM | POA: Diagnosis not present

## 2020-05-09 DIAGNOSIS — M47812 Spondylosis without myelopathy or radiculopathy, cervical region: Secondary | ICD-10-CM | POA: Diagnosis not present

## 2020-05-09 DIAGNOSIS — Z043 Encounter for examination and observation following other accident: Secondary | ICD-10-CM | POA: Diagnosis not present

## 2020-05-09 DIAGNOSIS — M2578 Osteophyte, vertebrae: Secondary | ICD-10-CM | POA: Diagnosis not present

## 2020-05-14 DIAGNOSIS — J189 Pneumonia, unspecified organism: Secondary | ICD-10-CM | POA: Diagnosis not present

## 2020-05-14 DIAGNOSIS — S0191XD Laceration without foreign body of unspecified part of head, subsequent encounter: Secondary | ICD-10-CM | POA: Diagnosis not present

## 2020-05-14 DIAGNOSIS — S0003XD Contusion of scalp, subsequent encounter: Secondary | ICD-10-CM | POA: Diagnosis not present

## 2020-05-14 DIAGNOSIS — R296 Repeated falls: Secondary | ICD-10-CM | POA: Diagnosis not present

## 2020-05-17 DIAGNOSIS — E875 Hyperkalemia: Secondary | ICD-10-CM | POA: Diagnosis not present

## 2020-05-17 DIAGNOSIS — N179 Acute kidney failure, unspecified: Secondary | ICD-10-CM | POA: Diagnosis not present

## 2020-05-17 DIAGNOSIS — A419 Sepsis, unspecified organism: Secondary | ICD-10-CM | POA: Diagnosis not present

## 2020-05-17 DIAGNOSIS — J9601 Acute respiratory failure with hypoxia: Secondary | ICD-10-CM | POA: Diagnosis not present

## 2020-05-17 DIAGNOSIS — N3289 Other specified disorders of bladder: Secondary | ICD-10-CM | POA: Diagnosis not present

## 2020-05-17 DIAGNOSIS — R4182 Altered mental status, unspecified: Secondary | ICD-10-CM | POA: Diagnosis not present

## 2020-05-17 DIAGNOSIS — S72121A Displaced fracture of lesser trochanter of right femur, initial encounter for closed fracture: Secondary | ICD-10-CM | POA: Diagnosis not present

## 2020-05-17 DIAGNOSIS — J168 Pneumonia due to other specified infectious organisms: Secondary | ICD-10-CM | POA: Diagnosis not present

## 2020-05-17 DIAGNOSIS — J690 Pneumonitis due to inhalation of food and vomit: Secondary | ICD-10-CM | POA: Diagnosis not present

## 2020-05-17 DIAGNOSIS — K529 Noninfective gastroenteritis and colitis, unspecified: Secondary | ICD-10-CM | POA: Diagnosis not present

## 2020-05-18 DIAGNOSIS — J690 Pneumonitis due to inhalation of food and vomit: Secondary | ICD-10-CM | POA: Diagnosis not present

## 2020-05-18 DIAGNOSIS — J189 Pneumonia, unspecified organism: Secondary | ICD-10-CM | POA: Diagnosis not present

## 2020-05-18 DIAGNOSIS — J9601 Acute respiratory failure with hypoxia: Secondary | ICD-10-CM | POA: Diagnosis not present

## 2020-05-18 DIAGNOSIS — A419 Sepsis, unspecified organism: Secondary | ICD-10-CM | POA: Diagnosis not present

## 2020-06-05 DEATH — deceased
